# Patient Record
Sex: Female | Born: 1945
Health system: Southern US, Community
[De-identification: ages and names within clinical notes are randomized; demographics above are authoritative.]

## PROBLEM LIST (undated history)

## (undated) DIAGNOSIS — E119 Type 2 diabetes mellitus without complications: Secondary | ICD-10-CM

## (undated) DIAGNOSIS — E7800 Pure hypercholesterolemia, unspecified: Secondary | ICD-10-CM

## (undated) DIAGNOSIS — I10 Essential (primary) hypertension: Secondary | ICD-10-CM

## (undated) HISTORY — PX: POLYPECTOMY: SHX149

## (undated) HISTORY — PX: DILATION AND CURETTAGE OF UTERUS: SHX78

---

## 2006-06-24 ENCOUNTER — Ambulatory Visit: Payer: Self-pay | Admitting: Internal Medicine

## 2007-08-18 ENCOUNTER — Ambulatory Visit: Payer: Self-pay | Admitting: Internal Medicine

## 2010-04-17 ENCOUNTER — Ambulatory Visit: Payer: Self-pay | Admitting: Internal Medicine

## 2014-01-04 ENCOUNTER — Ambulatory Visit: Payer: Self-pay

## 2014-12-18 DIAGNOSIS — E119 Type 2 diabetes mellitus without complications: Secondary | ICD-10-CM | POA: Diagnosis not present

## 2014-12-18 DIAGNOSIS — I10 Essential (primary) hypertension: Secondary | ICD-10-CM | POA: Diagnosis not present

## 2014-12-18 DIAGNOSIS — E538 Deficiency of other specified B group vitamins: Secondary | ICD-10-CM | POA: Insufficient documentation

## 2014-12-18 DIAGNOSIS — E780 Pure hypercholesterolemia: Secondary | ICD-10-CM | POA: Diagnosis not present

## 2014-12-18 DIAGNOSIS — D509 Iron deficiency anemia, unspecified: Secondary | ICD-10-CM | POA: Diagnosis not present

## 2015-02-07 DIAGNOSIS — I10 Essential (primary) hypertension: Secondary | ICD-10-CM | POA: Diagnosis not present

## 2015-02-07 DIAGNOSIS — E780 Pure hypercholesterolemia: Secondary | ICD-10-CM | POA: Diagnosis not present

## 2015-02-07 DIAGNOSIS — E538 Deficiency of other specified B group vitamins: Secondary | ICD-10-CM | POA: Diagnosis not present

## 2015-02-07 DIAGNOSIS — Z79899 Other long term (current) drug therapy: Secondary | ICD-10-CM | POA: Diagnosis not present

## 2015-02-07 DIAGNOSIS — E119 Type 2 diabetes mellitus without complications: Secondary | ICD-10-CM | POA: Diagnosis not present

## 2015-02-18 DIAGNOSIS — E119 Type 2 diabetes mellitus without complications: Secondary | ICD-10-CM | POA: Diagnosis not present

## 2015-02-18 DIAGNOSIS — I10 Essential (primary) hypertension: Secondary | ICD-10-CM | POA: Diagnosis not present

## 2015-02-18 DIAGNOSIS — E538 Deficiency of other specified B group vitamins: Secondary | ICD-10-CM | POA: Diagnosis not present

## 2015-02-18 DIAGNOSIS — E780 Pure hypercholesterolemia: Secondary | ICD-10-CM | POA: Diagnosis not present

## 2015-05-17 DIAGNOSIS — E119 Type 2 diabetes mellitus without complications: Secondary | ICD-10-CM | POA: Diagnosis not present

## 2015-05-20 ENCOUNTER — Other Ambulatory Visit: Payer: Self-pay | Admitting: Internal Medicine

## 2015-05-20 DIAGNOSIS — Z1231 Encounter for screening mammogram for malignant neoplasm of breast: Secondary | ICD-10-CM

## 2015-05-21 ENCOUNTER — Other Ambulatory Visit: Payer: Self-pay | Admitting: Internal Medicine

## 2015-05-21 ENCOUNTER — Ambulatory Visit
Admission: RE | Admit: 2015-05-21 | Discharge: 2015-05-21 | Disposition: A | Discharge status: Home / Self Care | Payer: Commercial Managed Care - HMO | Source: Ambulatory Visit | Attending: Internal Medicine | Admitting: Internal Medicine

## 2015-05-21 DIAGNOSIS — Z1231 Encounter for screening mammogram for malignant neoplasm of breast: Secondary | ICD-10-CM | POA: Diagnosis not present

## 2015-05-24 DIAGNOSIS — E780 Pure hypercholesterolemia: Secondary | ICD-10-CM | POA: Diagnosis not present

## 2015-05-24 DIAGNOSIS — E119 Type 2 diabetes mellitus without complications: Secondary | ICD-10-CM | POA: Diagnosis not present

## 2015-05-24 DIAGNOSIS — D509 Iron deficiency anemia, unspecified: Secondary | ICD-10-CM | POA: Diagnosis not present

## 2015-05-24 DIAGNOSIS — I10 Essential (primary) hypertension: Secondary | ICD-10-CM | POA: Diagnosis not present

## 2015-07-16 DIAGNOSIS — I10 Essential (primary) hypertension: Secondary | ICD-10-CM | POA: Diagnosis not present

## 2015-07-16 DIAGNOSIS — R5381 Other malaise: Secondary | ICD-10-CM | POA: Diagnosis not present

## 2015-07-16 DIAGNOSIS — R5383 Other fatigue: Secondary | ICD-10-CM | POA: Diagnosis not present

## 2015-08-16 DIAGNOSIS — Z79899 Other long term (current) drug therapy: Secondary | ICD-10-CM | POA: Diagnosis not present

## 2015-08-16 DIAGNOSIS — I10 Essential (primary) hypertension: Secondary | ICD-10-CM | POA: Diagnosis not present

## 2015-08-16 DIAGNOSIS — E119 Type 2 diabetes mellitus without complications: Secondary | ICD-10-CM | POA: Diagnosis not present

## 2015-08-16 DIAGNOSIS — E7800 Pure hypercholesterolemia, unspecified: Secondary | ICD-10-CM | POA: Diagnosis not present

## 2015-08-23 DIAGNOSIS — Z79899 Other long term (current) drug therapy: Secondary | ICD-10-CM | POA: Diagnosis not present

## 2015-08-23 DIAGNOSIS — E7800 Pure hypercholesterolemia, unspecified: Secondary | ICD-10-CM | POA: Diagnosis not present

## 2015-08-23 DIAGNOSIS — I10 Essential (primary) hypertension: Secondary | ICD-10-CM | POA: Diagnosis not present

## 2015-08-23 DIAGNOSIS — E119 Type 2 diabetes mellitus without complications: Secondary | ICD-10-CM | POA: Diagnosis not present

## 2015-11-06 DIAGNOSIS — J208 Acute bronchitis due to other specified organisms: Secondary | ICD-10-CM | POA: Diagnosis not present

## 2015-11-06 DIAGNOSIS — B9689 Other specified bacterial agents as the cause of diseases classified elsewhere: Secondary | ICD-10-CM | POA: Diagnosis not present

## 2015-11-06 DIAGNOSIS — J019 Acute sinusitis, unspecified: Secondary | ICD-10-CM | POA: Diagnosis not present

## 2015-11-19 DIAGNOSIS — E119 Type 2 diabetes mellitus without complications: Secondary | ICD-10-CM | POA: Diagnosis not present

## 2015-11-19 DIAGNOSIS — Z79899 Other long term (current) drug therapy: Secondary | ICD-10-CM | POA: Diagnosis not present

## 2015-11-26 DIAGNOSIS — E118 Type 2 diabetes mellitus with unspecified complications: Secondary | ICD-10-CM | POA: Diagnosis not present

## 2015-11-26 DIAGNOSIS — E1165 Type 2 diabetes mellitus with hyperglycemia: Secondary | ICD-10-CM | POA: Diagnosis not present

## 2015-11-26 DIAGNOSIS — E7800 Pure hypercholesterolemia, unspecified: Secondary | ICD-10-CM | POA: Diagnosis not present

## 2015-11-26 DIAGNOSIS — N8110 Cystocele, unspecified: Secondary | ICD-10-CM | POA: Diagnosis not present

## 2015-11-26 DIAGNOSIS — I119 Hypertensive heart disease without heart failure: Secondary | ICD-10-CM | POA: Diagnosis not present

## 2015-11-26 DIAGNOSIS — I10 Essential (primary) hypertension: Secondary | ICD-10-CM | POA: Diagnosis not present

## 2015-12-23 DIAGNOSIS — H5210 Myopia, unspecified eye: Secondary | ICD-10-CM | POA: Diagnosis not present

## 2015-12-23 DIAGNOSIS — E113393 Type 2 diabetes mellitus with moderate nonproliferative diabetic retinopathy without macular edema, bilateral: Secondary | ICD-10-CM | POA: Diagnosis not present

## 2015-12-23 DIAGNOSIS — H524 Presbyopia: Secondary | ICD-10-CM | POA: Diagnosis not present

## 2016-01-01 DIAGNOSIS — N814 Uterovaginal prolapse, unspecified: Secondary | ICD-10-CM | POA: Diagnosis not present

## 2016-01-01 DIAGNOSIS — N8110 Cystocele, unspecified: Secondary | ICD-10-CM | POA: Diagnosis not present

## 2016-01-02 DIAGNOSIS — E7800 Pure hypercholesterolemia, unspecified: Secondary | ICD-10-CM | POA: Diagnosis not present

## 2016-01-02 DIAGNOSIS — I10 Essential (primary) hypertension: Secondary | ICD-10-CM | POA: Diagnosis not present

## 2016-01-02 DIAGNOSIS — I119 Hypertensive heart disease without heart failure: Secondary | ICD-10-CM | POA: Diagnosis not present

## 2016-01-02 DIAGNOSIS — Z79899 Other long term (current) drug therapy: Secondary | ICD-10-CM | POA: Diagnosis not present

## 2016-01-02 DIAGNOSIS — E538 Deficiency of other specified B group vitamins: Secondary | ICD-10-CM | POA: Diagnosis not present

## 2016-01-02 DIAGNOSIS — R0789 Other chest pain: Secondary | ICD-10-CM | POA: Diagnosis not present

## 2016-01-02 DIAGNOSIS — N812 Incomplete uterovaginal prolapse: Secondary | ICD-10-CM | POA: Diagnosis not present

## 2016-01-02 DIAGNOSIS — E118 Type 2 diabetes mellitus with unspecified complications: Secondary | ICD-10-CM | POA: Diagnosis not present

## 2016-01-02 DIAGNOSIS — E1165 Type 2 diabetes mellitus with hyperglycemia: Secondary | ICD-10-CM | POA: Diagnosis not present

## 2016-01-02 DIAGNOSIS — Z4689 Encounter for fitting and adjustment of other specified devices: Secondary | ICD-10-CM | POA: Diagnosis not present

## 2016-01-14 DIAGNOSIS — E7800 Pure hypercholesterolemia, unspecified: Secondary | ICD-10-CM | POA: Diagnosis not present

## 2016-01-14 DIAGNOSIS — Z7189 Other specified counseling: Secondary | ICD-10-CM | POA: Diagnosis not present

## 2016-01-14 DIAGNOSIS — I10 Essential (primary) hypertension: Secondary | ICD-10-CM | POA: Diagnosis not present

## 2016-01-14 DIAGNOSIS — I208 Other forms of angina pectoris: Secondary | ICD-10-CM | POA: Diagnosis not present

## 2016-01-29 DIAGNOSIS — I208 Other forms of angina pectoris: Secondary | ICD-10-CM | POA: Diagnosis not present

## 2016-01-29 DIAGNOSIS — I10 Essential (primary) hypertension: Secondary | ICD-10-CM | POA: Diagnosis not present

## 2016-01-29 DIAGNOSIS — E7800 Pure hypercholesterolemia, unspecified: Secondary | ICD-10-CM | POA: Diagnosis not present

## 2016-02-06 DIAGNOSIS — E1165 Type 2 diabetes mellitus with hyperglycemia: Secondary | ICD-10-CM | POA: Diagnosis not present

## 2016-02-06 DIAGNOSIS — I10 Essential (primary) hypertension: Secondary | ICD-10-CM | POA: Diagnosis not present

## 2016-02-06 DIAGNOSIS — E538 Deficiency of other specified B group vitamins: Secondary | ICD-10-CM | POA: Diagnosis not present

## 2016-02-06 DIAGNOSIS — E7800 Pure hypercholesterolemia, unspecified: Secondary | ICD-10-CM | POA: Diagnosis not present

## 2016-02-06 DIAGNOSIS — E118 Type 2 diabetes mellitus with unspecified complications: Secondary | ICD-10-CM | POA: Diagnosis not present

## 2016-02-06 DIAGNOSIS — Z79899 Other long term (current) drug therapy: Secondary | ICD-10-CM | POA: Diagnosis not present

## 2016-02-14 DIAGNOSIS — I119 Hypertensive heart disease without heart failure: Secondary | ICD-10-CM | POA: Diagnosis not present

## 2016-02-14 DIAGNOSIS — I10 Essential (primary) hypertension: Secondary | ICD-10-CM | POA: Diagnosis not present

## 2016-02-14 DIAGNOSIS — E119 Type 2 diabetes mellitus without complications: Secondary | ICD-10-CM | POA: Diagnosis not present

## 2016-02-14 DIAGNOSIS — E7800 Pure hypercholesterolemia, unspecified: Secondary | ICD-10-CM | POA: Diagnosis not present

## 2016-03-09 ENCOUNTER — Observation Stay
Admission: EM | Admit: 2016-03-09 | Discharge: 2016-03-09 | Disposition: A | Discharge status: Home / Self Care | Payer: Commercial Managed Care - HMO | Attending: Internal Medicine | Admitting: Internal Medicine

## 2016-03-09 ENCOUNTER — Encounter: Payer: Self-pay | Admitting: Emergency Medicine

## 2016-03-09 ENCOUNTER — Other Ambulatory Visit: Payer: Self-pay

## 2016-03-09 ENCOUNTER — Observation Stay: Payer: Commercial Managed Care - HMO

## 2016-03-09 ENCOUNTER — Emergency Department: Payer: Commercial Managed Care - HMO

## 2016-03-09 DIAGNOSIS — M25512 Pain in left shoulder: Secondary | ICD-10-CM | POA: Insufficient documentation

## 2016-03-09 DIAGNOSIS — R079 Chest pain, unspecified: Secondary | ICD-10-CM | POA: Diagnosis not present

## 2016-03-09 DIAGNOSIS — E785 Hyperlipidemia, unspecified: Secondary | ICD-10-CM | POA: Diagnosis not present

## 2016-03-09 DIAGNOSIS — I10 Essential (primary) hypertension: Secondary | ICD-10-CM | POA: Diagnosis present

## 2016-03-09 DIAGNOSIS — Z79899 Other long term (current) drug therapy: Secondary | ICD-10-CM | POA: Diagnosis not present

## 2016-03-09 DIAGNOSIS — E7800 Pure hypercholesterolemia, unspecified: Secondary | ICD-10-CM | POA: Insufficient documentation

## 2016-03-09 DIAGNOSIS — Z794 Long term (current) use of insulin: Secondary | ICD-10-CM | POA: Diagnosis not present

## 2016-03-09 DIAGNOSIS — I2510 Atherosclerotic heart disease of native coronary artery without angina pectoris: Secondary | ICD-10-CM | POA: Insufficient documentation

## 2016-03-09 DIAGNOSIS — E119 Type 2 diabetes mellitus without complications: Secondary | ICD-10-CM | POA: Insufficient documentation

## 2016-03-09 DIAGNOSIS — M47814 Spondylosis without myelopathy or radiculopathy, thoracic region: Secondary | ICD-10-CM | POA: Diagnosis not present

## 2016-03-09 DIAGNOSIS — K760 Fatty (change of) liver, not elsewhere classified: Secondary | ICD-10-CM | POA: Insufficient documentation

## 2016-03-09 DIAGNOSIS — Z888 Allergy status to other drugs, medicaments and biological substances status: Secondary | ICD-10-CM | POA: Insufficient documentation

## 2016-03-09 DIAGNOSIS — Z7982 Long term (current) use of aspirin: Secondary | ICD-10-CM | POA: Insufficient documentation

## 2016-03-09 DIAGNOSIS — I200 Unstable angina: Secondary | ICD-10-CM | POA: Diagnosis not present

## 2016-03-09 HISTORY — DX: Pure hypercholesterolemia, unspecified: E78.00

## 2016-03-09 HISTORY — DX: Essential (primary) hypertension: I10

## 2016-03-09 HISTORY — DX: Type 2 diabetes mellitus without complications: E11.9

## 2016-03-09 LAB — BASIC METABOLIC PANEL
Anion gap: 7 (ref 5–15)
BUN: 13 mg/dL (ref 6–20)
CHLORIDE: 104 mmol/L (ref 101–111)
CO2: 27 mmol/L (ref 22–32)
CREATININE: 0.97 mg/dL (ref 0.44–1.00)
Calcium: 9.7 mg/dL (ref 8.9–10.3)
GFR calc Af Amer: >60 mL/min (ref >60)
GFR calc non Af Amer: 58 mL/min — ABNORMAL LOW (ref >60)
GLUCOSE: 263 mg/dL — AB (ref 65–99)
Potassium: 4 mmol/L (ref 3.5–5.1)
SODIUM: 138 mmol/L (ref 135–145)

## 2016-03-09 LAB — CBC
HCT: 40.8 % (ref 35.0–47.0)
Hemoglobin: 13.7 g/dL (ref 12.0–16.0)
MCH: 29.6 pg (ref 26.0–34.0)
MCHC: 33.4 g/dL (ref 32.0–36.0)
MCV: 88.5 fL (ref 80.0–100.0)
PLATELETS: 302 10*3/uL (ref 150–440)
RBC: 4.61 MIL/uL (ref 3.80–5.20)
RDW: 13.6 % (ref 11.5–14.5)
WBC: 7.1 10*3/uL (ref 3.6–11.0)

## 2016-03-09 LAB — LIPID PANEL
CHOL/HDL RATIO: 3.9 ratio
Cholesterol: 184 mg/dL (ref 0–200)
HDL: 47 mg/dL (ref >40)
LDL Cholesterol: 85 mg/dL (ref 0–99)
Triglycerides: 260 mg/dL — ABNORMAL HIGH (ref <150)
VLDL: 52 mg/dL — AB (ref 0–40)

## 2016-03-09 LAB — GLUCOSE, CAPILLARY
GLUCOSE-CAPILLARY: 245 mg/dL — AB (ref 65–99)
GLUCOSE-CAPILLARY: 137 mg/dL — AB (ref 65–99)
Glucose-Capillary: 156 mg/dL — ABNORMAL HIGH (ref 65–99)

## 2016-03-09 LAB — TROPONIN I
TROPONIN I: 0.04 ng/mL — AB (ref <0.031)
Troponin I: 0.04 ng/mL — ABNORMAL HIGH (ref <0.031)
Troponin I: 0.04 ng/mL — ABNORMAL HIGH (ref <0.031)

## 2016-03-09 MED ORDER — IOPAMIDOL (ISOVUE-300) INJECTION 61%
75.0000 mL | Freq: Once | INTRAVENOUS | Status: AC | PRN
  Administered 2016-03-09: 75 mL via INTRAVENOUS

## 2016-03-09 MED ORDER — SODIUM CHLORIDE 0.9% FLUSH
3.0000 mL | Freq: Two times a day (BID) | INTRAVENOUS | Status: DC
  Administered 2016-03-09: 3 mL via INTRAVENOUS

## 2016-03-09 MED ORDER — SPIRONOLACTONE 25 MG PO TABS
25.0000 mg | ORAL_TABLET | Freq: Every day | ORAL | Status: DC
  Administered 2016-03-09: 25 mg via ORAL
  Filled 2016-03-09: qty 1

## 2016-03-09 MED ORDER — ACETAMINOPHEN 325 MG PO TABS
650.0000 mg | ORAL_TABLET | ORAL | Status: DC | PRN
Start: 2016-03-09 — End: 2016-03-09

## 2016-03-09 MED ORDER — INSULIN DETEMIR 100 UNIT/ML ~~LOC~~ SOLN
20.0000 [IU] | Freq: Every day | SUBCUTANEOUS | Status: DC
  Filled 2016-03-09: qty 0.2

## 2016-03-09 MED ORDER — ASPIRIN 81 MG PO CHEW
324.0000 mg | CHEWABLE_TABLET | ORAL | Status: AC
  Administered 2016-03-09: 324 mg via ORAL
  Filled 2016-03-09: qty 4

## 2016-03-09 MED ORDER — ONDANSETRON HCL 4 MG/2ML IJ SOLN
4.0000 mg | Freq: Four times a day (QID) | INTRAMUSCULAR | Status: DC | PRN
  Administered 2016-03-09: 4 mg via INTRAVENOUS
  Filled 2016-03-09: qty 2

## 2016-03-09 MED ORDER — HYDRALAZINE HCL 20 MG/ML IJ SOLN
10.0000 mg | INTRAMUSCULAR | Status: DC | PRN
  Administered 2016-03-09: 10 mg via INTRAVENOUS
  Filled 2016-03-09: qty 1

## 2016-03-09 MED ORDER — ASPIRIN EC 81 MG PO TBEC: 81.0000 mg | DELAYED_RELEASE_TABLET | Freq: Every day | ORAL | Status: DC

## 2016-03-09 MED ORDER — METOPROLOL SUCCINATE ER 50 MG PO TB24
50.0000 mg | ORAL_TABLET | Freq: Every day | ORAL | Status: DC
  Administered 2016-03-09: 50 mg via ORAL
  Filled 2016-03-09: qty 1

## 2016-03-09 MED ORDER — ASPIRIN 300 MG RE SUPP: 300.0000 mg | RECTAL | Status: AC

## 2016-03-09 MED ORDER — ENOXAPARIN SODIUM 40 MG/0.4ML ~~LOC~~ SOLN
40.0000 mg | Freq: Every day | SUBCUTANEOUS | Status: DC
  Administered 2016-03-09: 40 mg via SUBCUTANEOUS
  Filled 2016-03-09 (×2): qty 0.4

## 2016-03-09 MED ORDER — NITROGLYCERIN 0.4 MG SL SUBL
0.4000 mg | SUBLINGUAL_TABLET | SUBLINGUAL | Status: DC | PRN
Start: 2016-03-09 — End: 2016-03-09
  Administered 2016-03-09 (×2): 0.4 mg via SUBLINGUAL
  Filled 2016-03-09: qty 1

## 2016-03-09 MED ORDER — HYDROCODONE-ACETAMINOPHEN 5-325 MG PO TABS
1.0000 | ORAL_TABLET | ORAL | Status: DC | PRN
  Administered 2016-03-09: 1 via ORAL
  Filled 2016-03-09: qty 1

## 2016-03-09 MED ORDER — SODIUM CHLORIDE 0.9 % IV SOLN: 250.0000 mL | INTRAVENOUS | Status: DC | PRN

## 2016-03-09 MED ORDER — SIMVASTATIN 40 MG PO TABS: 40.0000 mg | ORAL_TABLET | Freq: Every day | ORAL | Status: DC

## 2016-03-09 MED ORDER — MORPHINE SULFATE (PF) 2 MG/ML IV SOLN
2.0000 mg | INTRAVENOUS | Status: DC | PRN
  Administered 2016-03-09 (×2): 2 mg via INTRAVENOUS
  Filled 2016-03-09 (×2): qty 1

## 2016-03-09 MED ORDER — SODIUM CHLORIDE 0.9% FLUSH
3.0000 mL | INTRAVENOUS | Status: DC | PRN
  Administered 2016-03-09 (×2): 3 mL via INTRAVENOUS
  Filled 2016-03-09 (×2): qty 3

## 2016-03-09 MED ORDER — INSULIN ASPART 100 UNIT/ML ~~LOC~~ SOLN: 0.0000 [IU] | Freq: Every day | SUBCUTANEOUS | Status: DC

## 2016-03-09 MED ORDER — IRBESARTAN 150 MG PO TABS
150.0000 mg | ORAL_TABLET | Freq: Every day | ORAL | Status: DC
  Administered 2016-03-09: 150 mg via ORAL
  Filled 2016-03-09: qty 1

## 2016-03-09 MED ORDER — INSULIN ASPART 100 UNIT/ML ~~LOC~~ SOLN
0.0000 [IU] | Freq: Three times a day (TID) | SUBCUTANEOUS | Status: DC
  Administered 2016-03-09: 2 [IU] via SUBCUTANEOUS
  Administered 2016-03-09: 3 [IU] via SUBCUTANEOUS
  Administered 2016-03-09: 5 [IU] via SUBCUTANEOUS
  Filled 2016-03-09: qty 2
  Filled 2016-03-09: qty 3
  Filled 2016-03-09: qty 5

## 2016-03-09 NOTE — ED Notes (Signed)
MD at bedside. 

## 2016-03-09 NOTE — Progress Notes (Signed)
Patient ID: Sabrina [name], female   DOB: 11-19-1945, 70 y.o.   MRN: [id number] [facility] [place] NAME: Sabrina [name]    MR#:  [id number]  DATE OF BIRTH:  1946-08-06  SUBJECTIVE:  Patient comes in with intermittent on and off chest pain she describes is deep down on the left upper chest radiating across. Denies any diaphoresis or shortness of breath. Pain has been going on and off since January.  REVIEW OF SYSTEMS:   Review of Systems  Constitutional: Negative for fever, chills and weight loss.  HENT: Negative for ear discharge, ear pain and nosebleeds.   Eyes: Negative for blurred vision, pain and discharge.  Respiratory: Negative for sputum production, shortness of breath, wheezing and stridor.   Cardiovascular: Negative for chest pain, palpitations, orthopnea and PND.  Gastrointestinal: Negative for nausea, vomiting, abdominal pain and diarrhea.  Genitourinary: Negative for urgency and frequency.  Musculoskeletal: Negative for back pain and joint pain.  Neurological: Negative for sensory change, speech change, focal weakness and weakness.  Psychiatric/Behavioral: Negative for depression and hallucinations. The patient is not nervous/anxious.    Tolerating Diet:yes Tolerating PT: not needed  DRUG ALLERGIES:   Allergies  Allergen Reactions  . Glimepiride Diarrhea and Other (See Comments)  . Glipizide Other (See Comments)  . Pioglitazone Swelling    On legs    VITALS:  Blood pressure 167/69, pulse 71, temperature 97.5 F (36.4 C), temperature source Oral, resp. rate 17, height 5\' 1"  (1.549 m), weight 72.303 kg (159 lb 6.4 oz), SpO2 99 %.  PHYSICAL EXAMINATION:   Physical Exam  GENERAL:  70 y.o.-year-old patient lying in the bed with no acute distress.  EYES: Pupils equal, round, reactive to light and accommodation. No scleral icterus. Extraocular muscles intact.  HEENT: Head atraumatic, normocephalic. Oropharynx and  nasopharynx clear.  NECK:  Supple, no jugular venous distention. No thyroid enlargement, no tenderness.  LUNGS: Normal breath sounds bilaterally, no wheezing, rales, rhonchi. No use of accessory muscles of respiration.  CARDIOVASCULAR: S1, S2 normal. No murmurs, rubs, or gallops.  ABDOMEN: Soft, nontender, nondistended. Bowel sounds present. No organomegaly or mass.  EXTREMITIES: No cyanosis, clubbing or edema b/l.    NEUROLOGIC: Cranial nerves II through XII are intact. No focal Motor or sensory deficits b/l.   PSYCHIATRIC:  patient is alert and oriented x 3.  SKIN: No obvious rash, lesion, or ulcer.   LABORATORY PANEL:  CBC  Recent Labs Lab 03/09/16 0052  WBC 7.1  HGB 13.7  HCT 40.8  PLT 302    Chemistries   Recent Labs Lab 03/09/16 0052  NA 138  K 4.0  CL 104  CO2 27  GLUCOSE 263*  BUN 13  CREATININE 0.97  CALCIUM 9.7   Cardiac Enzymes  Recent Labs Lab 03/09/16 1241  TROPONINI 0.04*   RADIOLOGY:  Dg Chest 2 View  03/09/2016  CLINICAL DATA:  70 year old female with chest pain EXAM: CHEST  2 VIEW COMPARISON:  None. FINDINGS: The heart size and mediastinal contours are within normal limits. Both lungs are clear. The visualized skeletal structures are unremarkable. IMPRESSION: No active cardiopulmonary disease. Electronically Signed   By: Anner Crete M.D.   On: 03/09/2016 01:36   ASSESSMENT AND PLAN:  Jerzie Sidman is a 70 y.o. female with a known history of Hypertension, diabetes mellitus, hyperlipidemia presented to the emergency room with chest pain. Chest pain is going on for more than 3 months duration. Pain is located in  the left side of the chest and radiates to her left shoulder. Whenever the pain comes it is sharp in nature 6 out of 10 on a scale of 1-10. Patient had a cardiac stress test done as an outpatient and she says her stress test was normal  1. Left upper chest pain on and off since January 2017 appears atypical. Unclear etiology. -Patient had  undergone cardiac stress test and April 2017 was within normal limits -Seen by Dr. Nehemiah Massed today recommends ambulation and no further cardiac workup. -Called by RN patient continues to have chest pain will do CT of the chest with contrast -Cardiac markers of borderline elevated appears demand ischemia  2. Hypertension continue home meds  3. Diabetes on home meds and sliding scale  4. Hyperlipidemia on statins  Discussed with patient's daughter and patient. If CT chest remains negative we will consider discharging patient home Case discussed with Care Management/Social Worker. Management plans discussed with the patient, family and they are in agreement.  CODE STATUS: full  DVT Prophylaxis: lovenox  TOTAL TIME TAKING CARE OF THIS PATIENT: 30 minutes.  >50% time spent on counselling and coordination of care  Note: This dictation was prepared with Dragon dictation along with smaller phrase technology. Any transcriptional errors that result from this process are unintentional.  Suda Forbess M.D on 03/09/2016 at 2:38 PM  Between 7am to 6pm - Pager - [phone]  After 6pm go to [email or web address] - password [email or web address] [facility]  Office  [phone]  CC: Primary care physician; [name], MD

## 2016-03-09 NOTE — Discharge Instructions (Signed)
Take your metformin after 48 hours

## 2016-03-09 NOTE — Plan of Care (Signed)
Problem: Pain Managment: Goal: General experience of comfort will improve Outcome: Not Progressing Patient continue to have chest pain after 2 sublingual nitroglycerins. It is at 3/10, no complete relief.  Patient states pain is tolerable. Patient became nauseous after taking nitroglycerin. Chest pain escalated within the first 5 minutes after recieving 2nd nitro, then de-escalated. Patient is currently resting in bed.

## 2016-03-09 NOTE — Progress Notes (Signed)
Home meds from pharmacy given to pt.  Pt/daughter concerned that pt is still having pain and no clear etiology.  Recommended follow up with Primary care md.

## 2016-03-09 NOTE — Progress Notes (Signed)
Pt discharged to home via wc.  Instructions  given to pt.  Questions answered.  No distress.  

## 2016-03-09 NOTE — Progress Notes (Signed)
Back from CT

## 2016-03-09 NOTE — Consult Note (Signed)
[facility] Consultation Note  Patient ID: Sabrina [name], MRN: [id number], DOB/AGE: January 11, 1946 70 y.o. Admit date: 03/09/2016   Date of Consult: 03/09/2016 Primary Physician: [name], MD Primary Cardiologist: Nehemiah Massed  Chief Complaint:  Chief Complaint  Patient presents with  . Chest Pain   Reason for Consult: chest pain  HPI: 70 y.o. female with known essential hypertension mixed hyperlipidemia and diabetes who is had intermittent episodes of atypical chest discomfort in the left upper chest deep in the issues with some activity and movement but not necessarily significantly with exertion. The patient does have pain when moving her left arm. The pain has been severe in nature and deep over the last several days to the point where she was seen in the emergency room. At that time the patient had an EKG showing normal sinus rhythm and troponin level was 0.04 unchanged. The patient has been on appropriate antihypertensive medication management and antianginal medications with no improvements. This includes metoprolol and Avapro. Patient also does have other risk factors including diabetes which increases her level of risk of but currently with continued atypical signs and no evidence of myocardial infarction will not need further intervention. She did have a recent stress test and echo cardiogram showing normal LV systolic function and normal myocardial perfusion. She also is on high intensity cholesterol therapy stable at this time  Past Medical History  Diagnosis Date  . Hypertension   . Diabetes mellitus without complication ([place])   . High cholesterol       Surgical History:  Past Surgical History  Procedure Laterality Date  . Polypectomy       Home Meds: Prior to Admission medications   Medication Sig Start Date End Date Taking? Authorizing Provider  aspirin EC 81 MG tablet Take 1 tablet by mouth daily.   Yes Historical Provider, MD  irbesartan (AVAPRO) 150  MG tablet Take 1 tablet by mouth daily. 02/18/16  Yes Historical Provider, MD  LEVEMIR FLEXTOUCH 100 UNIT/ML Pen Inject 20 Units into the skin at bedtime. 02/17/16  Yes Historical Provider, MD  metFORMIN (GLUCOPHAGE) 500 MG tablet Take 1,000 mg by mouth 2 (two) times daily. 01/04/16  Yes Historical Provider, MD  metoprolol succinate (TOPROL-XL) 50 MG 24 hr tablet Take 50 mg by mouth daily. 02/17/16  Yes Historical Provider, MD  simvastatin (ZOCOR) 40 MG tablet Take 1 tablet by mouth at bedtime. 01/29/16  Yes Historical Provider, MD  spironolactone (ALDACTONE) 25 MG tablet Take 25 mg by mouth daily. 02/18/16  Yes Historical Provider, MD    Inpatient Medications:  . [START ON 03/10/2016] aspirin EC  81 mg Oral Daily  . enoxaparin (LOVENOX) injection  40 mg Subcutaneous Daily  . insulin aspart  0-15 Units Subcutaneous TID WC  . insulin aspart  0-5 Units Subcutaneous QHS  . insulin detemir  20 Units Subcutaneous QHS  . irbesartan  150 mg Oral Daily  . metoprolol succinate  50 mg Oral Daily  . simvastatin  40 mg Oral QHS  . sodium chloride flush  3 mL Intravenous Q12H  . spironolactone  25 mg Oral Daily      Allergies:  Allergies  Allergen Reactions  . Glimepiride Diarrhea and Other (See Comments)  . Glipizide Other (See Comments)  . Pioglitazone Swelling    On legs    Social History   Social History  . Marital Status: Divorced    Spouse Name: N/A  . Number of Children: N/A  . Years of Education: N/A  Occupational History  . retired    Social History Main Topics  . Smoking status: Never Smoker   . Smokeless tobacco: Not on file  . Alcohol Use: No  . Drug Use: No  . Sexual Activity: Not on file   Other Topics Concern  . Not on file   Social History Narrative     Family History  Problem Relation Age of Onset  . Breast cancer Maternal Aunt     2 aunts in their 31's     Review of Systems Positive for Left upper chest discomfort Negative for: General:  chills, fever,  night sweats or weight changes.  Cardiovascular: PND orthopnea syncope dizziness  Dermatological skin lesions rashes Respiratory: Cough congestion Urologic: Frequent urination urination at night and hematuria Abdominal: negative for nausea, vomiting, diarrhea, bright red blood per rectum, melena, or hematemesis Neurologic: negative for visual changes, and/or hearing changes  All other systems reviewed and are otherwise negative except as noted above.  Labs:  Recent Labs  03/09/16 0052 03/09/16 0656  TROPONINI 0.04* 0.04*   Lab Results  Component Value Date   WBC 7.1 03/09/2016   HGB 13.7 03/09/2016   HCT 40.8 03/09/2016   MCV 88.5 03/09/2016   PLT 302 03/09/2016    Recent Labs Lab 03/09/16 0052  NA 138  K 4.0  CL 104  CO2 27  BUN 13  CREATININE 0.97  CALCIUM 9.7  GLUCOSE 263*   Lab Results  Component Value Date   CHOL 184 03/09/2016   HDL 47 03/09/2016   LDLCALC 85 03/09/2016   TRIG 260* 03/09/2016   No results found for: DDIMER  Radiology/Studies:  Dg Chest 2 View  03/09/2016  CLINICAL DATA:  70 year old female with chest pain EXAM: CHEST  2 VIEW COMPARISON:  None. FINDINGS: The heart size and mediastinal contours are within normal limits. Both lungs are clear. The visualized skeletal structures are unremarkable. IMPRESSION: No active cardiopulmonary disease. Electronically Signed   By: Anner Crete M.D.   On: 03/09/2016 01:36    EKG: Normal sinus rhythm. Normal EKG  Weights: Filed Weights   03/09/16 0043 03/09/16 0432  Weight: 160 lb (72.576 kg) 159 lb 6.4 oz (72.303 kg)     Physical Exam: Blood pressure 167/69, pulse 71, temperature 97.5 F (36.4 C), temperature source Oral, resp. rate 17, height 5\' 1"  (1.549 m), weight 159 lb 6.4 oz (72.303 kg), SpO2 99 %. Body mass index is 30.13 kg/(m^2). General: Well developed, well nourished, in no acute distress. Head eyes ears nose throat: Normocephalic, atraumatic, sclera non-icteric, no xanthomas, nares  are without discharge. No apparent thyromegaly and/or mass  Lungs: Normal respiratory effort.  no wheezes, no rales, no rhonchi.  Heart: RRR with normal S1 S2. no murmur gallop, no rub, PMI is normal size and placement, carotid upstroke normal without bruit, jugular venous pressure is normal Abdomen: Soft, non-tender, non-distended with normoactive bowel sounds. No hepatomegaly. No rebound/guarding. No obvious abdominal masses. Abdominal aorta is normal size without bruit Extremities: No edema. no cyanosis, no clubbing, no ulcers  Peripheral : 2+ bilateral upper extremity pulses, 2+ bilateral femoral pulses, 2+ bilateral dorsal pedal pulse Neuro: Alert and oriented. No facial asymmetry. No focal deficit. Moves all extremities spontaneously. Musculoskeletal: Normal muscle tone without kyphosis Psych:  Responds to questions appropriately with a normal affect.    Assessment: 70 year old female with the continued intermittent and atypical left upper chest discomfort lasting for the last several months with a normal stress test and normal echocardiogram  and no current evidence of myocardial infarction  Plan: 1. Continue present management with hypertension control with a goal systolic blood pressure below 130 mm 2. Continue high intensity cholesterol therapy with simvastatin X 3. Possible use of isosorbide with ambulation and follow for improvements of symptoms 4. Aspirin for further risk reduction cardiovascular event 5. No further cardiac diagnostics necessary at this time 6. Okay for discharge home from cardiac standpoint and would ambulate and follow for worsening symptoms requiring possibility of cardiac catheterization  Signed, Corey Skains M.D. [facility] 03/09/2016, 12:58 PM

## 2016-03-09 NOTE — H&P (Signed)
[facility] [place] NAME: Sabrina [name]    MR#:  [id number]  DATE OF BIRTH:  1946/02/25  DATE OF ADMISSION:  03/09/2016  PRIMARY CARE PHYSICIAN: SPARKS,JEFFREY D, MD   REQUESTING/REFERRING PHYSICIAN:   CHIEF COMPLAINT:   Chief Complaint  Patient presents with  . Chest Pain    HISTORY OF PRESENT ILLNESS: Sabrina [name]  is a 70 y.o. female with a known history of Hypertension, diabetes mellitus, hyperlipidemia presented to the emergency room with chest pain. Chest pain is going on for more than 3 months duration. Pain is located in the left side of the chest and radiates to her left shoulder. Whenever the pain comes it is sharp in nature 6 out of 10 on a scale of 1-10. Patient had a cardiac stress test done as an outpatient and she says her stress test was normal. No history of shortness of breath. No history of orthopnea or proximal nocturnal dyspnea. First set of troponin is borderline. EKG normal sinus rhythm no ST segment elevation or depression.  PAST MEDICAL HISTORY:   Past Medical History  Diagnosis Date  . Hypertension   . Diabetes mellitus without complication ([place])   . High cholesterol     PAST SURGICAL HISTORY: Past Surgical History  Procedure Laterality Date  . Polypectomy      SOCIAL HISTORY:  Social History  Substance Use Topics  . Smoking status: Never Smoker   . Smokeless tobacco: Not on file  . Alcohol Use: No    FAMILY HISTORY:  Family History  Problem Relation Age of Onset  . Breast cancer Maternal Aunt     2 aunts in their 56's    DRUG ALLERGIES:  Allergies  Allergen Reactions  . Glimepiride Diarrhea and Other (See Comments)  . Glipizide Other (See Comments)  . Pioglitazone Swelling    On legs    REVIEW OF SYSTEMS:   CONSTITUTIONAL: No fever, fatigue or weakness.  EYES: No blurred or double vision.  EARS, NOSE, AND THROAT: No tinnitus or ear pain.  RESPIRATORY: No cough, shortness of  breath, wheezing or hemoptysis.  CARDIOVASCULAR: Has chest pain,no orthopnea, edema.  GASTROINTESTINAL: No nausea, vomiting, diarrhea or abdominal pain.  GENITOURINARY: No dysuria, hematuria.  ENDOCRINE: No polyuria, nocturia,  HEMATOLOGY: No anemia, easy bruising or bleeding SKIN: No rash or lesion. MUSCULOSKELETAL: No joint pain or arthritis.   NEUROLOGIC: No tingling, numbness, weakness.  PSYCHIATRY: No anxiety or depression.   MEDICATIONS AT HOME:  Prior to Admission medications   Medication Sig Start Date End Date Taking? Authorizing Provider  aspirin EC 81 MG tablet Take 1 tablet by mouth daily.   Yes Historical Provider, MD  irbesartan (AVAPRO) 150 MG tablet Take 1 tablet by mouth daily. 02/18/16  Yes Historical Provider, MD  LEVEMIR FLEXTOUCH 100 UNIT/ML Pen Inject 20 Units into the skin at bedtime. 02/17/16  Yes Historical Provider, MD  metFORMIN (GLUCOPHAGE) 500 MG tablet Take 1,000 mg by mouth 2 (two) times daily. 01/04/16  Yes Historical Provider, MD  metoprolol succinate (TOPROL-XL) 50 MG 24 hr tablet Take 50 mg by mouth daily. 02/17/16  Yes Historical Provider, MD  simvastatin (ZOCOR) 40 MG tablet Take 1 tablet by mouth at bedtime. 01/29/16  Yes Historical Provider, MD  spironolactone (ALDACTONE) 25 MG tablet Take 25 mg by mouth daily. 02/18/16  Yes Historical Provider, MD      PHYSICAL EXAMINATION:   VITAL SIGNS: Blood pressure 199/91, pulse 78, temperature 97.8 F (  36.6 C), temperature source Oral, resp. rate 18, height 5\' 1"  (1.549 m), weight 72.576 kg (160 lb), SpO2 98 %.  GENERAL:  70 y.o.-year-old patient lying in the bed with no acute distress.  EYES: Pupils equal, round, reactive to light and accommodation. No scleral icterus. Extraocular muscles intact.  HEENT: Head atraumatic, normocephalic. Oropharynx and nasopharynx clear.  NECK:  Supple, no jugular venous distention. No thyroid enlargement, no tenderness.  LUNGS: Normal breath sounds bilaterally, no wheezing,  rales,rhonchi or crepitation. No use of accessory muscles of respiration.  CARDIOVASCULAR: S1, S2 normal. No murmurs, rubs, or gallops.  ABDOMEN: Soft, nontender, nondistended. Bowel sounds present. No organomegaly or mass.  EXTREMITIES: No pedal edema, cyanosis, or clubbing.  NEUROLOGIC: Cranial nerves II through XII are intact. Muscle strength 5/5 in all extremities. Sensation intact. Gait normal.  PSYCHIATRIC: The patient is alert and oriented x 3.  SKIN: No obvious rash, lesion, or ulcer.   LABORATORY PANEL:   CBC  Recent Labs Lab 03/09/16 0052  WBC 7.1  HGB 13.7  HCT 40.8  PLT 302  MCV 88.5  MCH 29.6  MCHC 33.4  RDW 13.6   ------------------------------------------------------------------------------------------------------------------  Chemistries   Recent Labs Lab 03/09/16 0052  NA 138  K 4.0  CL 104  CO2 27  GLUCOSE 263*  BUN 13  CREATININE 0.97  CALCIUM 9.7   ------------------------------------------------------------------------------------------------------------------ estimated creatinine clearance is 49.9 mL/min (by C-G formula based on Cr of 0.97). ------------------------------------------------------------------------------------------------------------------ No results for input(s): TSH, T4TOTAL, T3FREE, THYROIDAB in the last 72 hours.  Invalid input(s): FREET3   Coagulation profile No results for input(s): INR, PROTIME in the last 168 hours. ------------------------------------------------------------------------------------------------------------------- No results for input(s): DDIMER in the last 72 hours. -------------------------------------------------------------------------------------------------------------------  Cardiac Enzymes  Recent Labs Lab 03/09/16 0052  TROPONINI 0.04*   ------------------------------------------------------------------------------------------------------------------ Invalid input(s):  POCBNP  ---------------------------------------------------------------------------------------------------------------  Urinalysis No results found for: COLORURINE, APPEARANCEUR, LABSPEC, PHURINE, GLUCOSEU, HGBUR, BILIRUBINUR, KETONESUR, PROTEINUR, UROBILINOGEN, NITRITE, LEUKOCYTESUR   RADIOLOGY: Dg Chest 2 View  03/09/2016  CLINICAL DATA:  70 year old female with chest pain EXAM: CHEST  2 VIEW COMPARISON:  None. FINDINGS: The heart size and mediastinal contours are within normal limits. Both lungs are clear. The visualized skeletal structures are unremarkable. IMPRESSION: No active cardiopulmonary disease. Electronically Signed   By: Anner Crete M.D.   On: 03/09/2016 01:36    EKG: Orders placed or performed during the hospital encounter of 03/09/16  . ED EKG within 10 minutes  . ED EKG within 10 minutes    IMPRESSION AND PLAN: 69 year old female patient with history of hypertension hyperlipidemia and diabetes mellitus presented to the emergency room with chest pain.  Admitting diagnosis : 1. Chest pain rule out myocardial infarction 2. Hypertension uncontrolled 3. Hyperlipidemia 4. Type 2 diabetes mellitus Treatment plan Admit patient to telemetry observation bed Aspirin 81 mg orally daily When necessary nitrates and morphine for chest pain Cycle troponin to rule out ischemia Check echocardiogram and cardiology consultation Antihypertensive medication for control of blood pressure Supportive care.  All the records are reviewed and case discussed with ED provider. Management plans discussed with the patient, family and they are in agreement.  CODE STATUS:FULL Code Status History    This patient does not have a recorded code status. Please follow your organizational policy for patients in this situation.       TOTAL TIME TAKING CARE OF THIS PATIENT: 50 minutes.    Saundra Shelling M.D on 03/09/2016 at 2:40 AM  Between 7am to 6pm - Pager -  [phone]  After 6pm  go to [email or web address] - password [email or web address] [facility]  Office  [phone]  CC: Primary care physician; [name], MD

## 2016-03-09 NOTE — Progress Notes (Signed)
Dr. Posey Pronto updated on CT results.  Orders to dc pt to home

## 2016-03-09 NOTE — Plan of Care (Signed)
Problem: Pain Managment: Goal: General experience of comfort will improve Outcome: Not Progressing Prn medications   

## 2016-03-09 NOTE — ED Provider Notes (Signed)
[facility] Emergency Department Provider Note   ____________________________________________  Time seen: Approximately 2:10 AM  I have reviewed the triage vital signs and the nursing notes.   HISTORY  Chief Complaint Chest Pain   HPI Sabrina [name] is a 70 y.o. female who complains of left sided chest pain. She reports she's had this for about 2 months she had a negative stress test last month with Dr. Nehemiah Massed. Today she reports the pain is much much worse than usual and is making her nauseated which is something that has not done before. Pain is worse with exertion and better with rest is also worse if she moves her left arm. Not worse with palpation of the chest. Pain is severe became severe today is been going on severely most of the day.   Past Medical History  Diagnosis Date  . Hypertension   . Diabetes mellitus without complication ([place])   . High cholesterol     Patient Active Problem List   Diagnosis Date Noted  . Chest pain at rest 03/09/2016  . Uncontrolled hypertension 03/09/2016  . Chest pain 03/09/2016    Past Surgical History  Procedure Laterality Date  . Polypectomy      No current outpatient prescriptions on file.  Allergies Glimepiride; Glipizide; and Pioglitazone  Family History  Problem Relation Age of Onset  . Breast cancer Maternal Aunt     2 aunts in their 64's    Social History Social History  Substance Use Topics  . Smoking status: Never Smoker   . Smokeless tobacco: None  . Alcohol Use: No    Review of Systems Constitutional: No fever/chills Eyes: No visual changes. ENT: No sore throat. Cardiovascular:chest pain. Respiratory: Denies shortness of breath. Gastrointestinal: No abdominal pain.   nausea, no vomiting.  No diarrhea.  No constipation. Genitourinary: Negative for dysuria. Musculoskeletal: Negative for back pain. Skin: Negative for rash. Neurological: Negative for headaches, focal weakness  or numbness.  10-point ROS otherwise negative.  ____________________________________________   PHYSICAL EXAM:  VITAL SIGNS: ED Triage Vitals  Enc Vitals Group     BP 03/09/16 0043 207/83 mmHg     Pulse Rate 03/09/16 0043 85     Resp 03/09/16 0043 18     Temp 03/09/16 0043 97.8 F (36.6 C)     Temp Source 03/09/16 0043 Oral     SpO2 03/09/16 0043 97 %     Weight 03/09/16 0043 160 lb (72.576 kg)     Height 03/09/16 0043 5\' 1"  (1.549 m)     Head Cir --      Peak Flow --      Pain Score 03/09/16 0044 10     Pain Loc --      Pain Edu? --      Excl. in [place]? --     Constitutional: Alert and oriented. Well appearing and in no acute distress. Eyes: Conjunctivae are normal. PERRL. EOMI. Head: Atraumatic. Nose: No congestion/rhinnorhea. Mouth/Throat: Mucous membranes are moist.  Oropharynx non-erythematous. Neck: No stridor.   Cardiovascular: Normal rate, regular rhythm. Grossly normal heart sounds.  Good peripheral circulation. Respiratory: Normal respiratory effort.  No retractions. Lungs CTAB. Gastrointestinal: Soft and nontender. No distention. No abdominal bruits. No CVA tenderness. Musculoskeletal: No lower extremity tenderness nor edema.  No joint effusions. Neurologic:  Normal speech and language. No gross focal neurologic deficits are appreciated. No gait instability. Skin:  Skin is warm, dry and intact. No rash noted.   ____________________________________________  LABS (all labs ordered are listed, but only abnormal results are displayed)  Labs Reviewed  BASIC METABOLIC PANEL - Abnormal; Notable for the following:    Glucose, Bld 263 (*)    GFR calc non Af Amer 58 (*)    All other components within normal limits  TROPONIN I - Abnormal; Notable for the following:    Troponin I 0.04 (*)    All other components within normal limits  TROPONIN I - Abnormal; Notable for the following:    Troponin I 0.04 (*)    All other components within normal limits  LIPID PANEL  - Abnormal; Notable for the following:    Triglycerides 260 (*)    VLDL 52 (*)    All other components within normal limits  CBC  TROPONIN I   ____________________________________________  EKG  KG read and interpreted by me shows normal sinus rhythm at a rate of 83 left axis deviation. His reading LVH probable old inferior infarct with Q's in II, III, and F poor R-wave progression and no acute ST-T wave changes ____________________________________________  RADIOLOGY  Chest X-rays read by radiology as no acute disease ____________________________________________   PROCEDURES   ____________________________________________   INITIAL IMPRESSION / ASSESSMENT AND PLAN / ED COURSE  Pertinent labs & imaging results that were available during my care of the patient were reviewed by me and considered in my medical decision making (see chart for details).  Discussed with hospitalist has multiple risk factors chest pain is little bit different than usual and her troponin is mildly elevated we'll plan on admitting her. ____________________________________________   FINAL CLINICAL IMPRESSION(S) / ED DIAGNOSES  Final diagnoses:  Chest pain, unspecified chest pain type      NEW MEDICATIONS STARTED DURING THIS VISIT:  Current Discharge Medication List       Note:  This document was prepared using Dragon voice recognition software and may include unintentional dictation errors.    [name], MD 03/09/16 [phone]

## 2016-03-09 NOTE — Discharge Summary (Signed)
[name] [facility] [place] NAME: Sabrina [name]    MR#:  [id number]  DATE OF BIRTH:  May 18, 1946  DATE OF ADMISSION:  03/09/2016 ADMITTING PHYSICIAN: Saundra Shelling, MD  DATE OF DISCHARGE: 03/09/16  PRIMARY CARE PHYSICIAN: SPARKS,JEFFREY D, MD    ADMISSION DIAGNOSIS:  Chest pain, unspecified chest pain type [R07.9]  DISCHARGE DIAGNOSIS:  Chest pain left upper-unclear etio HTN DM-2  SECONDARY DIAGNOSIS:   Past Medical History  Diagnosis Date  . Hypertension   . Diabetes mellitus without complication ([place])   . High cholesterol     HOSPITAL COURSE:  [name] is a 70 y.o. female with a known history of Hypertension, diabetes mellitus, hyperlipidemia presented to the emergency room with chest pain. Chest pain is going on for more than 3 months duration. Pain is located in the left side of the chest and radiates to her left shoulder. Whenever the pain comes it is sharp in nature 6 out of 10 on a scale of 1-10. Patient had a cardiac stress test done as an outpatient and she says her stress test was normal  1. Left upper chest pain on and off since January 2017 appears atypical. Unclear etiology. -Patient had undergone cardiac stress test and April 2017 was within normal limits -Seen by Dr. Nehemiah Massed today recommends ambulation and no further cardiac workup. -Called by RN patient continues to have chest pain will do CT of the chest with contrast-shows LAD plaque -Cardiac markers of borderline elevated appears demand ischemia -pt advised to f/u PCP and cardiology  2. Hypertension continue home meds  3. Diabetes on home meds and sliding scale -resume metformin after 48 hours given IV contrast  4. Hyperlipidemia on statins  D/c  home CONSULTS OBTAINED:  Treatment Team:  Corey Skains, MD  DRUG ALLERGIES:   Allergies  Allergen Reactions  . Glimepiride Diarrhea and Other (See Comments)  . Glipizide Other (See Comments)  .  Pioglitazone Swelling    On legs    DISCHARGE MEDICATIONS:   Current Discharge Medication List    CONTINUE these medications which have NOT CHANGED   Details  aspirin EC 81 MG tablet Take 1 tablet by mouth daily.    irbesartan (AVAPRO) 150 MG tablet Take 1 tablet by mouth daily. Refills: 1    LEVEMIR FLEXTOUCH 100 UNIT/ML Pen Inject 20 Units into the skin at bedtime. Refills: 0    metFORMIN (GLUCOPHAGE) 500 MG tablet Take 1,000 mg by mouth 2 (two) times daily. Refills: 10    metoprolol succinate (TOPROL-XL) 50 MG 24 hr tablet Take 50 mg by mouth daily. Refills: 0    simvastatin (ZOCOR) 40 MG tablet Take 1 tablet by mouth at bedtime. Refills: 4    spironolactone (ALDACTONE) 25 MG tablet Take 25 mg by mouth daily. Refills: 0        If you experience worsening of your admission symptoms, develop shortness of breath, life threatening emergency, suicidal or homicidal thoughts you must seek medical attention immediately by calling 911 or calling your MD immediately  if symptoms less severe.  You Must read complete instructions/literature along with all the possible adverse reactions/side effects for all the Medicines you take and that have been prescribed to you. Take any new Medicines after you have completely understood and accept all the possible adverse reactions/side effects.   Please note  You were cared for by a hospitalist during your hospital stay. If you have any questions about your discharge medications  or the care you received while you were in the hospital after you are discharged, you can call the unit and asked to speak with the hospitalist on call if the hospitalist that took care of you is not available. Once you are discharged, your primary care physician will handle any further medical issues. Please note that NO REFILLS for any discharge medications will be authorized once you are discharged, as it is imperative that you return to your primary care physician (or  establish a relationship with a primary care physician if you do not have one) for your aftercare needs so that they can reassess your need for medications and monitor your lab values. Marland Kitchen   DATA REVIEW:   CBC   Recent Labs Lab 03/09/16 0052  WBC 7.1  HGB 13.7  HCT 40.8  PLT 302    Chemistries   Recent Labs Lab 03/09/16 0052  NA 138  K 4.0  CL 104  CO2 27  GLUCOSE 263*  BUN 13  CREATININE 0.97  CALCIUM 9.7    Microbiology Results   No results found for this or any previous visit (from the past 240 hour(s)).  RADIOLOGY:  Dg Chest 2 View  03/09/2016  CLINICAL DATA:  70 year old female with chest pain EXAM: CHEST  2 VIEW COMPARISON:  None. FINDINGS: The heart size and mediastinal contours are within normal limits. Both lungs are clear. The visualized skeletal structures are unremarkable. IMPRESSION: No active cardiopulmonary disease. Electronically Signed   By: Anner Crete M.D.   On: 03/09/2016 01:36   Ct Chest W Contrast  03/09/2016  CLINICAL DATA:  Left-sided chest pain radiating centrally. EXAM: CT CHEST WITH CONTRAST TECHNIQUE: Multidetector CT imaging of the chest was performed during intravenous contrast administration. CONTRAST:  78mL ISOVUE-300 IOPAMIDOL (ISOVUE-300) INJECTION 61% COMPARISON:  Chest x-ray earlier today. CT of the abdomen on 04/17/2010 FINDINGS: Mediastinum/Nodes: Normal heart size. No pericardial fluid identified. Calcified plaque present in the LAD and left circumflex coronary arteries. There are some calcifications also associated with the aortic valve. The thoracic aorta is normal in caliber and shows no evidence of aneurysm or dissection. Proximal great vessels are well visualized and normally patent. No mediastinal lymphadenopathy identified. The right lobe of the thyroid gland is slightly larger than the left and heterogeneous in appearance. Findings are consistent with goiter. No mass-effect on the airway. Lungs/Pleura: Mild scarring present at  the left lung base. There is no evidence of pulmonary edema, consolidation, pneumothorax, nodule or pleural fluid. Upper abdomen: Upper abdominal structures show hepatic steatosis. Rounded cystic structure measuring 2.4 cm and demonstrating well-circumscribed circumferential peripheral calcification is positioned just inferior to the left lobe of the liver and does not appear to be associated with bowel. This likely represents a benign calcified cyst or region of prior hemorrhage and appears stable since the prior study. Musculoskeletal: Bony structures show diffuse spondylosis of the thoracic spine. No fractures or bony lesions identified. IMPRESSION: 1. Evidence of coronary atherosclerosis with calcified plaque in the distribution of the LAD and left circumflex coronary arteries. 2. Mild enlargement of the right lobe of the thyroid gland consistent with goiter. 3. Stable well-circumscribed cystic structure in the right abdomen inferior to the liver and demonstrating peripheral calcification. This has the appearance of a benign calcified cyst or region of potentially prior hemorrhage that has become calcified. Electronically Signed   By: Aletta Edouard M.D.   On: 03/09/2016 17:08     Management plans discussed with the patient, family and they  are in agreement.  CODE STATUS:     Code Status Orders        Start     Ordered   03/09/16 0431  Full code   Continuous     03/09/16 0430    Code Status History    Date Active Date Inactive Code Status Order ID Comments User Context   This patient has a current code status but no historical code status.      TOTAL TIME TAKING CARE OF THIS PATIENT: *40 minutes.    Addilynn Mowrer M.D on 03/09/2016 at 6:06 PM  Between 7am to 6pm - Pager - [phone] After 6pm go to [email or web address] - password [email or web address]  Office  [phone]  CC: Primary care physician; [name], MD

## 2016-03-09 NOTE — ED Notes (Signed)
Pt reports left upper chest pain for 3-4 months, daily; pt seen by Dr Nehemiah Massed for same, last saw him in April; had a stress test the beginning of April and was told heart was fine; pt says tonight the pain got so bad she "couldn't hardly lift my arm"; denies shortness; dizziness is intermittent; nausea Sunday but no vomiting;

## 2016-03-09 NOTE — Progress Notes (Signed)
To CT via bed.

## 2016-03-09 NOTE — Progress Notes (Signed)
Ambulated pt per md orders, pain worsening with ambulation, will page Dr. Posey Pronto.

## 2016-03-09 NOTE — Care Management Obs Status (Signed)
[place] NOTIFICATION   Patient Details  Name: Sabrina [name] MRN: [id number] Date of Birth: 09/08/1946   Medicare Observation Status Notification Given:  Yes    Jolly Mango, RN 03/09/2016, 1:43 PM

## 2016-03-09 NOTE — Progress Notes (Signed)
Dr. Posey Pronto called and updated on ambulation of pt and her increase in pain.  New orders received.

## 2016-03-10 DIAGNOSIS — R0789 Other chest pain: Secondary | ICD-10-CM | POA: Diagnosis not present

## 2016-03-10 DIAGNOSIS — I119 Hypertensive heart disease without heart failure: Secondary | ICD-10-CM | POA: Diagnosis not present

## 2016-03-10 DIAGNOSIS — I10 Essential (primary) hypertension: Secondary | ICD-10-CM | POA: Diagnosis not present

## 2016-03-10 DIAGNOSIS — E7800 Pure hypercholesterolemia, unspecified: Secondary | ICD-10-CM | POA: Diagnosis not present

## 2016-03-10 DIAGNOSIS — I208 Other forms of angina pectoris: Secondary | ICD-10-CM | POA: Diagnosis not present

## 2016-03-10 DIAGNOSIS — E119 Type 2 diabetes mellitus without complications: Secondary | ICD-10-CM | POA: Diagnosis not present

## 2016-03-28 ENCOUNTER — Emergency Department: Payer: Commercial Managed Care - HMO

## 2016-03-28 ENCOUNTER — Emergency Department
Admission: EM | Admit: 2016-03-28 | Discharge: 2016-03-31 | Disposition: A | Discharge status: Other Acute Inpatient Hospital | Payer: Commercial Managed Care - HMO | Attending: Emergency Medicine | Admitting: Emergency Medicine

## 2016-03-28 DIAGNOSIS — Z7982 Long term (current) use of aspirin: Secondary | ICD-10-CM | POA: Insufficient documentation

## 2016-03-28 DIAGNOSIS — F29 Unspecified psychosis not due to a substance or known physiological condition: Secondary | ICD-10-CM | POA: Insufficient documentation

## 2016-03-28 DIAGNOSIS — I10 Essential (primary) hypertension: Secondary | ICD-10-CM | POA: Diagnosis not present

## 2016-03-28 DIAGNOSIS — F312 Bipolar disorder, current episode manic severe with psychotic features: Secondary | ICD-10-CM | POA: Diagnosis not present

## 2016-03-28 DIAGNOSIS — Z7984 Long term (current) use of oral hypoglycemic drugs: Secondary | ICD-10-CM | POA: Insufficient documentation

## 2016-03-28 DIAGNOSIS — F23 Brief psychotic disorder: Secondary | ICD-10-CM

## 2016-03-28 DIAGNOSIS — Z9119 Patient's noncompliance with other medical treatment and regimen: Secondary | ICD-10-CM

## 2016-03-28 DIAGNOSIS — Z79899 Other long term (current) drug therapy: Secondary | ICD-10-CM | POA: Diagnosis not present

## 2016-03-28 DIAGNOSIS — E119 Type 2 diabetes mellitus without complications: Secondary | ICD-10-CM | POA: Insufficient documentation

## 2016-03-28 DIAGNOSIS — R4182 Altered mental status, unspecified: Secondary | ICD-10-CM

## 2016-03-28 DIAGNOSIS — Z046 Encounter for general psychiatric examination, requested by authority: Secondary | ICD-10-CM | POA: Diagnosis not present

## 2016-03-28 DIAGNOSIS — Z91199 Patient's noncompliance with other medical treatment and regimen due to unspecified reason: Secondary | ICD-10-CM

## 2016-03-28 LAB — ACETAMINOPHEN LEVEL: Acetaminophen (Tylenol), Serum: <10 ug/mL — ABNORMAL LOW (ref 10–30)

## 2016-03-28 LAB — COMPREHENSIVE METABOLIC PANEL
ALBUMIN: 5 g/dL (ref 3.5–5.0)
ALT: 25 U/L (ref 14–54)
AST: 24 U/L (ref 15–41)
Alkaline Phosphatase: 63 U/L (ref 38–126)
Anion gap: 17 — ABNORMAL HIGH (ref 5–15)
BUN: 22 mg/dL — AB (ref 6–20)
CHLORIDE: 106 mmol/L (ref 101–111)
CO2: 19 mmol/L — ABNORMAL LOW (ref 22–32)
CREATININE: 0.96 mg/dL (ref 0.44–1.00)
Calcium: 10.2 mg/dL (ref 8.9–10.3)
GFR calc Af Amer: >60 mL/min (ref >60)
GFR, EST NON AFRICAN AMERICAN: 59 mL/min — AB (ref >60)
GLUCOSE: 260 mg/dL — AB (ref 65–99)
POTASSIUM: 4.4 mmol/L (ref 3.5–5.1)
Sodium: 142 mmol/L (ref 135–145)
Total Bilirubin: 1.2 mg/dL (ref 0.3–1.2)
Total Protein: 8.1 g/dL (ref 6.5–8.1)

## 2016-03-28 LAB — CBC
HEMATOCRIT: 44.1 % (ref 35.0–47.0)
HEMOGLOBIN: 14.3 g/dL (ref 12.0–16.0)
MCH: 29.3 pg (ref 26.0–34.0)
MCHC: 32.4 g/dL (ref 32.0–36.0)
MCV: 90.6 fL (ref 80.0–100.0)
PLATELETS: 323 10*3/uL (ref 150–440)
RBC: 4.87 MIL/uL (ref 3.80–5.20)
RDW: 13.4 % (ref 11.5–14.5)
WBC: 6.6 10*3/uL (ref 3.6–11.0)

## 2016-03-28 LAB — SALICYLATE LEVEL: Salicylate Lvl: <4 mg/dL (ref 2.8–30.0)

## 2016-03-28 LAB — ETHANOL

## 2016-03-28 MED ORDER — LORAZEPAM 2 MG/ML IJ SOLN
2.0000 mg | Freq: Once | INTRAMUSCULAR | Status: AC
  Administered 2016-03-28: 2 mg via INTRAVENOUS
  Filled 2016-03-28: qty 1

## 2016-03-28 MED ORDER — ARIPIPRAZOLE 5 MG PO TABS
5.0000 mg | ORAL_TABLET | Freq: Two times a day (BID) | ORAL | Status: DC
  Administered 2016-03-29 – 2016-03-30 (×4): 5 mg via ORAL
  Filled 2016-03-28 (×9): qty 1

## 2016-03-28 MED ORDER — DIVALPROEX SODIUM 250 MG PO DR TAB
250.0000 mg | DELAYED_RELEASE_TABLET | Freq: Two times a day (BID) | ORAL | Status: DC
  Administered 2016-03-29 – 2016-03-30 (×3): 250 mg via ORAL
  Filled 2016-03-28 (×5): qty 1

## 2016-03-28 MED ORDER — ZIPRASIDONE MESYLATE 20 MG IM SOLR
20.0000 mg | Freq: Once | INTRAMUSCULAR | Status: AC
  Administered 2016-03-28: 20 mg via INTRAMUSCULAR
  Filled 2016-03-28: qty 20

## 2016-03-28 MED ORDER — HALOPERIDOL LACTATE 5 MG/ML IJ SOLN
INTRAMUSCULAR | Status: AC
  Filled 2016-03-28: qty 1

## 2016-03-28 MED ORDER — HALOPERIDOL LACTATE 5 MG/ML IJ SOLN
5.0000 mg | Freq: Once | INTRAMUSCULAR | Status: AC
  Administered 2016-03-28: 5 mg via INTRAMUSCULAR
  Filled 2016-03-28: qty 1

## 2016-03-28 NOTE — ED Notes (Signed)
Pt Picked up by police lives with daughter who is here. Pt IVC papers stating Hx of Bipolar and currently having a psychotic episode. Pt has been aggressive towards officer. IVC taken out by [facility]. Pt refuses to answer to any questions.

## 2016-03-28 NOTE — ED Notes (Signed)
Patient is IVC and she is pending psych placement R.Geri [place].M.A

## 2016-03-28 NOTE — Consult Note (Signed)
[facility] Face-to-Face Psychiatry Consult   Reason for Consult:  Consult for 70 year old woman with an apparent past history of mental illness brought into the hospital by her daughter Referring Physician:  Marcelene Butte Patient Identification: Sabrina [name] MRN:  [id number] Principal Diagnosis: Bipolar affective disorder, manic, severe, with psychotic behavior ([place]) Diagnosis:   Patient Active Problem List   Diagnosis Date Noted  . Bipolar affective disorder, manic, severe, with psychotic behavior ([place]) [F31.2] 03/28/2016  . Noncompliance [Z91.19] 03/28/2016  . Chest pain at rest [R07.9] 03/09/2016  . Uncontrolled hypertension [I10] 03/09/2016  . Chest pain [R07.9] 03/09/2016    Total Time spent with patient: 1 hour  Subjective:   Sabrina [name] is a 70 y.o. female patient admitted with "don't ask me nothing".  HPI:  Patient interviewed. Daughter was present and I interviewed her to some extent as well. Labs and vitals reviewed. Patient brought in by daughter who reports that for the past couple of weeks or at least since last Sunday the patient has been declining. She's been getting more and more disorganized in her thinking and agitated. The daughter suspects that the patient may be noncompliant with her medicine but she is not certain. There is no evidence of alcohol or drug abuse. On interview today the patient is very disorganized and psychotic and it is impossible to get much useful information. She has clang associations constant distractibility euphoric agitation at times. Admits that she's not been taking her medicine. Has no reason for that. No known recent stress.  Social history: Apparently it is just the patient and her daughter living at home.  Medical history: Patient has a history of chest pain uncontrolled hypertension diabetes multiple serious medical problems chronically. Recent hospitalization for complications of that.  Substance abuse history: No history of alcohol or drug  abuse identified  Past Psychiatric History: This is a little vague. Apparently there is report from the family and the patient that she does have a past history of mental health problems but there is nothing in her current medicines that his psychiatric related. Furthermore there is nothing in her recent hospitalizations to indicate psychiatric symptoms were than anyone knew she had a mental health problem. Patient is such a vague historian it's hard to know what kind of treatment she has had in the past. She refuses to divulge knowing of any psychiatric medicines. Denies suicide attempts.  Risk to Self: Is patient at risk for suicide?: No Risk to Others:   Prior Inpatient Therapy:   Prior Outpatient Therapy:    Past Medical History:  Past Medical History  Diagnosis Date  . Hypertension   . Diabetes mellitus without complication ([place])   . High cholesterol     Past Surgical History  Procedure Laterality Date  . Polypectomy     Family History:  Family History  Problem Relation Age of Onset  . Breast cancer Maternal Aunt     2 aunts in their 9's   Family Psychiatric  History: No known family history Social History:  History  Alcohol Use No     History  Drug Use No    Social History   Social History  . Marital Status: Divorced    Spouse Name: N/A  . Number of Children: N/A  . Years of Education: N/A   Occupational History  . retired    Social History Main Topics  . Smoking status: Never Smoker   . Smokeless tobacco: Not on file  . Alcohol Use: No  .  Drug Use: No  . Sexual Activity: Not on file   Other Topics Concern  . Not on file   Social History Narrative   Additional Social History:    Allergies:   Allergies  Allergen Reactions  . Glimepiride Diarrhea and Other (See Comments)  . Glipizide Other (See Comments)  . Lisinopril     Other reaction(s): Headache  . Losartan     Other reaction(s): Headache  . Pioglitazone Swelling    On legs    Labs:   Results for orders placed or performed during the hospital encounter of 03/28/16 (from the past 48 hour(s))  Comprehensive metabolic panel     Status: Abnormal   Collection Time: 03/28/16  1:14 PM  Result Value Ref Range   Sodium 142 135 - 145 mmol/L   Potassium 4.4 3.5 - 5.1 mmol/L   Chloride 106 101 - 111 mmol/L   CO2 19 (L) 22 - 32 mmol/L   Glucose, Bld 260 (H) 65 - 99 mg/dL   BUN 22 (H) 6 - 20 mg/dL   Creatinine, Ser 0.96 0.44 - 1.00 mg/dL   Calcium 10.2 8.9 - 10.3 mg/dL   Total Protein 8.1 6.5 - 8.1 g/dL   Albumin 5.0 3.5 - 5.0 g/dL   AST 24 15 - 41 U/L   ALT 25 14 - 54 U/L   Alkaline Phosphatase 63 38 - 126 U/L   Total Bilirubin 1.2 0.3 - 1.2 mg/dL   GFR calc non Af Amer 59 (L) >60 mL/min   GFR calc Af Amer >60 >60 mL/min    Comment: (NOTE) The eGFR has been calculated using the CKD EPI equation. This calculation has not been validated in all clinical situations. eGFR's persistently <60 mL/min signify possible Chronic Kidney Disease.    Anion gap 17 (H) 5 - 15  Ethanol     Status: None   Collection Time: 03/28/16  1:14 PM  Result Value Ref Range   Alcohol, Ethyl (B) <5 <5 mg/dL    Comment:        LOWEST DETECTABLE LIMIT FOR SERUM ALCOHOL IS 5 mg/dL FOR MEDICAL PURPOSES ONLY   Salicylate level     Status: None   Collection Time: 03/28/16  1:14 PM  Result Value Ref Range   Salicylate Lvl <8.0 2.8 - 30.0 mg/dL  Acetaminophen level     Status: Abnormal   Collection Time: 03/28/16  1:14 PM  Result Value Ref Range   Acetaminophen (Tylenol), Serum <10 (L) 10 - 30 ug/mL    Comment:        THERAPEUTIC CONCENTRATIONS VARY SIGNIFICANTLY. A RANGE OF 10-30 ug/mL MAY BE AN EFFECTIVE CONCENTRATION FOR MANY PATIENTS. HOWEVER, SOME ARE BEST TREATED AT CONCENTRATIONS OUTSIDE THIS RANGE. ACETAMINOPHEN CONCENTRATIONS >150 ug/mL AT 4 HOURS AFTER INGESTION AND >50 ug/mL AT 12 HOURS AFTER INGESTION ARE OFTEN ASSOCIATED WITH TOXIC REACTIONS.   cbc     Status: None    Collection Time: 03/28/16  1:14 PM  Result Value Ref Range   WBC 6.6 3.6 - 11.0 K/uL   RBC 4.87 3.80 - 5.20 MIL/uL   Hemoglobin 14.3 12.0 - 16.0 g/dL   HCT 44.1 35.0 - 47.0 %   MCV 90.6 80.0 - 100.0 fL   MCH 29.3 26.0 - 34.0 pg   MCHC 32.4 32.0 - 36.0 g/dL   RDW 13.4 11.5 - 14.5 %   Platelets 323 150 - 440 K/uL    Current Facility-Administered Medications  Medication Dose Route Frequency Provider Last  Rate Last Dose  . ARIPiprazole (ABILIFY) tablet 5 mg  5 mg Oral BID AC Alisandra Son T Summar Mcglothlin, MD      . divalproex (DEPAKOTE) DR tablet 250 mg  250 mg Oral Q12H Gonzella Lex, MD       Current Outpatient Prescriptions  Medication Sig Dispense Refill  . aspirin EC 81 MG tablet Take 1 tablet by mouth daily.    . irbesartan (AVAPRO) 150 MG tablet Take 1 tablet by mouth daily.  1  . LEVEMIR FLEXTOUCH 100 UNIT/ML Pen Inject 20 Units into the skin at bedtime.  0  . metFORMIN (GLUCOPHAGE) 500 MG tablet Take 1,000 mg by mouth 2 (two) times daily.  10  . metoprolol succinate (TOPROL-XL) 50 MG 24 hr tablet Take 50 mg by mouth daily.  0  . simvastatin (ZOCOR) 40 MG tablet Take 1 tablet by mouth at bedtime.  4  . spironolactone (ALDACTONE) 25 MG tablet Take 25 mg by mouth daily.  0    Musculoskeletal: Strength & Muscle Tone: within normal limits Gait & Station: normal Patient leans: N/A  Psychiatric Specialty Exam: Physical Exam  Nursing note and vitals reviewed. Constitutional: She appears well-developed and well-nourished.  HENT:  Head: Normocephalic and atraumatic.  Eyes: Conjunctivae are normal. Pupils are equal, round, and reactive to light.  Neck: Normal range of motion.  Cardiovascular: Normal rate.   Respiratory: Effort normal.  GI: Soft.  Musculoskeletal: Normal range of motion.  Neurological: She is alert.  Skin: Skin is warm and dry.  Psychiatric: Her affect is labile and inappropriate. Her speech is tangential. She is agitated. Thought content is paranoid and delusional.  Cognition and memory are impaired. She expresses impulsivity.    Review of Systems  Constitutional: Negative.   HENT: Negative.   Eyes: Negative.   Respiratory: Negative.   Cardiovascular: Negative.   Gastrointestinal: Negative.   Musculoskeletal: Negative.   Skin: Negative.   Neurological: Negative.   Psychiatric/Behavioral: Negative for depression, suicidal ideas, hallucinations, memory loss and substance abuse. The patient is not nervous/anxious and does not have insomnia.     Blood pressure 114/96, pulse 120, temperature 98.3 F (36.8 C), temperature source Oral, resp. rate 16, height _0  (1.549 m), weight 70.308 kg (155 lb), SpO2 98 %.Body mass index is 29.3 kg/(m^2).  General Appearance: Disheveled  Eye Contact:  Good  Speech:  Pressured  Volume:  Decreased  Mood:  Irritable  Affect:  Labile and Full Range  Thought Process:  Irrelevant  Orientation:  Full (Time, Place, and Person)  Thought Content:  Illogical  Suicidal Thoughts:  No  Homicidal Thoughts:  No  Memory:  Negative  Judgement:  Impaired  Insight:  Lacking  Psychomotor Activity:  Normal  Concentration:  Concentration: Poor  Recall:  Poor  Fund of Knowledge:  Fair  Language:  Fair  Akathisia:  No  Handed:  Right  AIMS (if indicated):     Assets:  Housing Social Support  ADL's:  Intact  Cognition:  Impaired,  Mild  Sleep:        Treatment Plan Summary: Daily contact with patient to assess and evaluate symptoms and progress in treatment, Medication management and Plan 70 year old woman who was presented originally to me as having a history of bipolar disorder. I don't really see any clear documentation of that in the old chart. Patient is not really able to give any history. Daughter seems to indicate that there have been mental health problems in the past. Interviewing the patient  she has a mental state that would be consistent with a bipolar mania area also however possibly consistent with organic  features. There is been a recent stress apparently that the daughter has breast cancer and is undergoing treatment that could be an emotional trigger. Labs all reviewed. I'm going to suggest we get a CT scan as well. I'm going to start her on a low dose of Depakote 250 twice a day and Abilify 5 mg twice a day. We can look into referral to geriatric psychiatry units because we have no beds available. If a bed here does become available we can reconsider appropriateness. Patient and daughter are apprised of the plan. Continue IVC.  Disposition: Recommend psychiatric Inpatient admission when medically cleared. Supportive therapy provided about ongoing stressors.  Alethia Berthold, MD 03/28/2016 5:37 PM

## 2016-03-28 NOTE — ED Notes (Signed)
Pt lives with daughter. Per daughter, pts psychotic break was triggered by family problems. Pt daughter newly diagnosed with breast cancer, brother recently had leg amputated, and nephew who pt lives with was expelled from school. Per daughter, pt never uses alcohol or drugs. Per daughter, pt has been talking excessively and thoughts are incoherent. Pt only on medication for blood pressure and diabetes. Per daughter, pt was prescribed Zolof, but has never taken it.

## 2016-03-28 NOTE — ED Provider Notes (Signed)
ED ECG REPORT I, Daymon Larsen, the attending physician, personally viewed and interpreted this ECG.  Date: 03/28/2016 EKG Time: 1736 Rate: 100 Rhythm: normal sinus rhythm QRS Axis: Left axis deviation Intervals: normal ST/T Wave abnormalities: normal Conduction Disturbances: none Narrative Interpretation:  Poor R-wave progression noticed in the anterior leads  Anterior fascicular block Left ventricular hypertrophy Old QRS complexes nose and the inferior leads, no acute ischemic changes  Daymon Larsen, MD 03/28/16 1754

## 2016-03-28 NOTE — ED Notes (Signed)
Patient transported to CT 

## 2016-03-28 NOTE — ED Notes (Signed)
Pt sleeping soundly with occasional snoring resp, breathing unlabored and even

## 2016-03-28 NOTE — BH Assessment (Signed)
Assessment Note  Sabrina [name] is an 70 y.o. female who presents to the ER due to having odd and bizarre behaviors. Patient was at the local park, "talking out of her head and saying things that didn't make sense." When family arrived, law enforcement and EMS was already on site. Mobile Crisis was contacted as well and the worker knows and acknowledge her behaviors weren't normal for her.  Per the report of the patient's daughter Sabrina [name] Slade-[phone]), she noticed a change in her behavior this past Sunday (03/22/2016). She was paranoid, liable and incoherent. She called the police because she saw someone trying to break in their home. Patient reported she saw someone outside the home walking around and then she saw the doorknob turn. Daughter states that would  be impossible. "No one can get to the main door, without opening the screen door first and it was locked." In addition, the patient wasn't in the part of the house, to be able to see the door. Patient has started refusing to eat food without seeing it being prepared. Even with people she knows and trust.   The daughter started reaching out for help from the local community mental health agency ([facility]), when she started noticing the changes. Patient has no history of mental illness or history of her current behaviors. Daughter believes "she's having a nervous break down." There have been several major changes in their family, which may be the cause. This past May (2017), her brother had to get both of his legs amputated and "they both had a hard time adjusting to it." Within the same month, the daughter was diagnosed with breast cancer. She had to have a surgery and planning to start radiation next week. This past Friday, her grandson was suspended from school and it triggered the memory of her son being put into prison. Her son was around the same age, of the grandson, when he started to get in trouble and it started with him being suspend from  school.  Daughter had to start sleeping down stairs, due to the fear of the patient trying to leave, the home again, during the night. She had to have her nephew and niece, stay with another family member for the time being, due to concerns of them getting upset about the patient behaviors.   During the interview, the patient was incoherent and liable. Majority of her answer weren't related to the questions. At times, she became verbally aggressive.  Patient is able to walk and perform her ADL's without assistance. Prior to this week, the patient was driving and doing things for herself.  Daughter Sabrina [name] [phone]) is the main contact person for the patient.  Past Medical History:  Past Medical History  Diagnosis Date  . Hypertension   . Diabetes mellitus without complication ([place])   . High cholesterol     Past Surgical History  Procedure Laterality Date  . Polypectomy      Family History:  Family History  Problem Relation Age of Onset  . Breast cancer Maternal Aunt     2 aunts in their 70's    Social History:  reports that she has never smoked. She does not have any smokeless tobacco history on file. She reports that she does not drink alcohol or use illicit drugs.  Additional Social History:  Alcohol / Drug Use Pain Medications: See PTA Prescriptions: See PTA Over the Counter: See PTA History of alcohol / drug use?: No history of alcohol / drug abuse Longest  period of sobriety (when/how long): Have no history of past of current use Negative Consequences of Use:  (Have no history of past of current use) Withdrawal Symptoms:  (Have no history of past of current use)  CIWA: CIWA-Ar BP: (!) 114/96 mmHg Pulse Rate: (!) 120 COWS:    Allergies:  Allergies  Allergen Reactions  . Glimepiride Diarrhea and Other (See Comments)  . Glipizide Other (See Comments)  . Lisinopril     Other reaction(s): Headache  . Losartan     Other reaction(s): Headache  .  Pioglitazone Swelling    On legs    Home Medications:  (Not in a hospital admission)  OB/GYN Status:  No LMP recorded. Patient is postmenopausal.  General Assessment Data Location of Assessment: [facility] ED TTS Assessment: In system Is this a Tele or Face-to-Face Assessment?: Face-to-Face Is this an Initial Assessment or a Re-assessment for this encounter?: Initial Assessment Marital status: Single Maiden name: n/a Is patient pregnant?: No Pregnancy Status: No Living Arrangements: Children Can pt return to current living arrangement?: Yes Admission Status: Involuntary Is patient capable of signing voluntary admission?: No Referral Source: Self/Family/Friend Insurance type: Medicare  Medical Screening Exam ([place]) Medical Exam completed: Yes  Crisis Care Plan Living Arrangements: Children Legal Guardian: Other: (None) Name of Psychiatrist: Reports of none Name of Therapist: Reports of none  Education Status Is patient currently in school?: No Current Grade: n/a Highest grade of school patient has completed: [employer] Name of school: n/a Contact person: n/a  Risk to self with the past 6 months Suicidal Ideation: No Has patient been a risk to self within the past 6 months prior to admission? : No Suicidal Intent: No Has patient had any suicidal intent within the past 6 months prior to admission? : No Is patient at risk for suicide?: No Suicidal Plan?: No Has patient had any suicidal plan within the past 6 months prior to admission? : No Access to Means: No What has been your use of drugs/alcohol within the last 12 months?: Reports of none Previous Attempts/Gestures: No How many times?: 0 Other Self Harm Risks: Reports of none Triggers for Past Attempts: None known Intentional Self Injurious Behavior: None Family Suicide History: No Recent stressful life event(s): Other (Comment), Loss (Comment) Persecutory voices/beliefs?: No Depression:  Yes Depression Symptoms: Feeling angry/irritable Substance abuse history and/or treatment for substance abuse?: No Suicide prevention information given to non-admitted patients: Not applicable  Risk to Others within the past 6 months Homicidal Ideation: No Does patient have any lifetime risk of violence toward others beyond the six months prior to admission? : No Thoughts of Harm to Others: No Current Homicidal Intent: No Current Homicidal Plan: No Access to Homicidal Means: No Identified Victim: Reports of none History of harm to others?: No Assessment of Violence: In distant past Violent Behavior Description: Was agitated upon arrival to the ER Does patient have access to weapons?: No Criminal Charges Pending?: No Does patient have a court date: No Is patient on probation?: No  Psychosis Hallucinations: None noted Delusions: None noted  Mental Status Report Appearance/Hygiene: In scrubs, In hospital gown, Unremarkable Eye Contact: Fair Motor Activity: Freedom of movement, Unremarkable Speech: Incoherent, Argumentative, Loud Level of Consciousness: Alert, Irritable, Restless Mood: Anxious, Irritable, Angry, Labile Affect: Fearful, Irritable, Frightened, Labile, Anxious, Apprehensive Anxiety Level: Moderate Thought Processes: Irrelevant, Flight of Ideas, Tangential Judgement: Impaired Orientation: Person, Place Obsessive Compulsive Thoughts/Behaviors: Minimal  Cognitive Functioning Concentration: Decreased Memory: Recent Impaired, Remote Impaired IQ: Average Insight:  Poor Impulse Control: Poor Appetite: Fair Weight Loss: 0 Weight Gain: 0 Sleep: Decreased Total Hours of Sleep: 3 Vegetative Symptoms: None  ADLScreening [facility] Assessment Services) Patient's cognitive ability adequate to safely complete daily activities?: Yes Patient able to express need for assistance with ADLs?: Yes Independently performs ADLs?: Yes (appropriate for developmental age)  Prior  Inpatient Therapy Prior Inpatient Therapy: No Prior Therapy Dates: Reports of none Prior Therapy Facilty/Provider(s): Reports of none Reason for Treatment: Reports of none  Prior Outpatient Therapy Prior Outpatient Therapy: No Prior Therapy Dates: Reports of none Prior Therapy Facilty/Provider(s): Reports of none Reason for Treatment: Reports of none Does patient have an ACCT team?: No Does patient have Intensive In-House Services?  : No Does patient have [facility] services? : No Does patient have P4CC services?: No  ADL Screening (condition at time of admission) Patient's cognitive ability adequate to safely complete daily activities?: Yes Is the patient deaf or have difficulty hearing?: No Does the patient have difficulty seeing, even when wearing glasses/contacts?: No Does the patient have difficulty concentrating, remembering, or making decisions?: No Patient able to express need for assistance with ADLs?: Yes Does the patient have difficulty dressing or bathing?: No Independently performs ADLs?: Yes (appropriate for developmental age) Does the patient have difficulty walking or climbing stairs?: No Weakness of Legs: None Weakness of Arms/Hands: None  Home Assistive Devices/Equipment Home Assistive Devices/Equipment: None  Therapy Consults (therapy consults require a physician order) PT Evaluation Needed: No OT Evalulation Needed: No SLP Evaluation Needed: No Abuse/Neglect Assessment (Assessment to be complete while patient is alone) Physical Abuse: Denies Verbal Abuse: Denies Sexual Abuse: Denies Exploitation of patient/patient's resources: Denies Self-Neglect: Denies Values / Beliefs Cultural Requests During Hospitalization: None Spiritual Requests During Hospitalization: None Consults Spiritual Care Consult Needed: No Social Work Consult Needed: No      Additional Information 1:1 In Past 12 Months?: No CIRT Risk: No Elopement Risk: No Does patient have  medical clearance?: Yes  Child/Adolescent Assessment Running Away Risk: Denies (Patient is an adult)  Disposition:  Disposition Initial Assessment Completed for this Encounter: Yes Disposition of Patient: Other dispositions (ER Ordered Psych Consult) Other disposition(s): Other (Comment) (ER Ordered Psych Consult)  On Site Evaluation by:   Reviewed with Physician:    Gunnar Fusi MS, LCAS, LPC, [place], CCSI Therapeutic Triage Specialist 03/28/2016 7:27 PM

## 2016-03-28 NOTE — ED Notes (Addendum)
Sabrina [name]  [phone] Home  [phone] Daughter taking clothes and shoes

## 2016-03-28 NOTE — ED Notes (Signed)
Pt non cooperative. Needs x-ray and EKG for placement. Pt refusing.
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
## 2016-03-29 LAB — GLUCOSE, CAPILLARY
Glucose-Capillary: 218 mg/dL — ABNORMAL HIGH (ref 65–99)
Glucose-Capillary: 241 mg/dL — ABNORMAL HIGH (ref 65–99)

## 2016-03-29 MED ORDER — INSULIN DETEMIR 100 UNIT/ML ~~LOC~~ SOLN
20.0000 [IU] | Freq: Every day | SUBCUTANEOUS | Status: DC
  Administered 2016-03-29 – 2016-03-30 (×2): 20 [IU] via SUBCUTANEOUS
  Filled 2016-03-29 (×4): qty 0.2

## 2016-03-29 MED ORDER — SIMVASTATIN 40 MG PO TABS
40.0000 mg | ORAL_TABLET | Freq: Every day | ORAL | Status: DC
  Administered 2016-03-29 – 2016-03-30 (×2): 40 mg via ORAL
  Filled 2016-03-29 (×2): qty 1

## 2016-03-29 MED ORDER — METFORMIN HCL 500 MG PO TABS
1000.0000 mg | ORAL_TABLET | Freq: Two times a day (BID) | ORAL | Status: DC
  Administered 2016-03-29 – 2016-03-31 (×5): 1000 mg via ORAL
  Filled 2016-03-29 (×5): qty 2

## 2016-03-29 MED ORDER — IRBESARTAN 150 MG PO TABS
150.0000 mg | ORAL_TABLET | Freq: Every day | ORAL | Status: DC
  Administered 2016-03-29 – 2016-03-30 (×2): 150 mg via ORAL
  Filled 2016-03-29 (×4): qty 1

## 2016-03-29 MED ORDER — ASPIRIN EC 81 MG PO TBEC
81.0000 mg | DELAYED_RELEASE_TABLET | Freq: Every day | ORAL | Status: DC
  Administered 2016-03-29 – 2016-03-31 (×3): 81 mg via ORAL
  Filled 2016-03-29 (×3): qty 1

## 2016-03-29 MED ORDER — METOPROLOL SUCCINATE ER 50 MG PO TB24
50.0000 mg | ORAL_TABLET | Freq: Every day | ORAL | Status: DC
  Administered 2016-03-29 – 2016-03-30 (×2): 50 mg via ORAL
  Filled 2016-03-29 (×3): qty 1

## 2016-03-29 MED ORDER — SPIRONOLACTONE 25 MG PO TABS
25.0000 mg | ORAL_TABLET | Freq: Every day | ORAL | Status: DC
  Administered 2016-03-29 – 2016-03-31 (×3): 25 mg via ORAL
  Filled 2016-03-29 (×4): qty 1

## 2016-03-29 MED ORDER — INSULIN DETEMIR 100 UNIT/ML FLEXPEN: 20.0000 [IU] | PEN_INJECTOR | Freq: Every day | SUBCUTANEOUS | Status: DC

## 2016-03-29 NOTE — ED Notes (Signed)
Pt. given bath and fresh linens.

## 2016-03-29 NOTE — BHH Counselor (Addendum)
Pt referral faxed to the following facilities for Geriatric Inpatient Tx:  [place]: ([phone]) [facility]: [phone]) [facility]: [phone])

## 2016-03-29 NOTE — ED Notes (Addendum)
Woke pt up to administer medication. States she does not take these medication however was willing to take. MAR confirmed and medication administered. States was not given breakfast tray. Breakfast tray at bedside. Pt states tray not there prior. Offered sandwich tray and pt accepted. Katie EDT to give tray. Will continue to assess

## 2016-03-29 NOTE — BH Assessment (Signed)
Writer called and left a HIPPA Compliant message with patient's daughter [name] [phone]), requesting a return phone call.

## 2016-03-29 NOTE — ED Provider Notes (Signed)
-----------------------------------------   9:00 AM on 03/29/2016 -----------------------------------------   Blood pressure 113/70, pulse 88, temperature 97.6 F (36.4 C), temperature source Oral, resp. rate 17, height 5\' 1"  (1.549 m), weight 155 lb (70.308 kg), SpO2 99 %.  The patient had no acute events since last update.  Calm and cooperative at this time.  Disposition is pending per Psychiatry/Behavioral Medicine team recommendations.     Joanne Gavel, MD 03/29/16 0900

## 2016-03-29 NOTE — BH Assessment (Addendum)
Patient has been accepted to [facility].  Patient assigned to "Geriatric Unit" Accepting physician is Dr. Bjorn Loser.  Call report to [phone].  Representative was [place].  ER Staff is aware of it Edd Arbour, ER Sect.; Dr. Edd Fabian, ER MD & Yong Channel, Patient's Nurse)    Patient's daugther [name] [phone]) have been updated as well.   Daughter was wanting patient to be admitted at [facility]. It would be better for her and the family due to the current things they have going on. Writer explained to her, we had to go with the first available bed. Writer further explain to her, if anything changes he would let her know, but at this time [facility] is the only bed that is available.   Patient bed will be available tomorrow (03/30/2016) anytime after 1pm.

## 2016-03-30 ENCOUNTER — Encounter: Payer: Self-pay | Admitting: Emergency Medicine

## 2016-03-30 LAB — GLUCOSE, CAPILLARY: GLUCOSE-CAPILLARY: 178 mg/dL — AB (ref 65–99)

## 2016-03-30 NOTE — Progress Notes (Signed)
TTS has decline bed offer at [facility]. Pt will be admitted her at [facility]    Patient is to be admitted to [place] by Dr. Weber Cooks.  Attending Physician will be Dr. Bary Leriche.   Patient has been assigned to room 311, by [place] Nurse Anderson Malta.   Intake Paper Work has been signed and placed on patient chart.  ER staff is aware of the admission [facility] ER Sect.; ER MD; [place]  Patient Access).     03/30/2016 Con Memos, MS, [place], [facility] Therapeutic Triage Specialist
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
## 2016-03-30 NOTE — ED Provider Notes (Signed)
-----------------------------------------   6:38 AM on 03/30/2016 -----------------------------------------   Blood pressure 106/58, pulse 95, temperature 98.3 F (36.8 C), temperature source Oral, resp. rate 18, height 5\' 1"  (1.549 m), weight 155 lb (70.308 kg), SpO2 96 %.  The patient had no acute events since last update.  Calm and cooperative at this time.  Disposition is pending per Psychiatry/Behavioral Medicine team recommendations.     Paulette Blanch, MD 03/30/16 [phone]

## 2016-03-30 NOTE — ED Notes (Signed)
Pt to be admitted to [facility]  - bed has been assigned and I am waiting for TTS/Nicole to call me when bed is clean and I can call report

## 2016-03-30 NOTE — ED Notes (Signed)
BEHAVIORAL HEALTH ROUNDING Patient sleeping: No. Patient alert and oriented: yes Behavior appropriate: Yes.  ; If no, describe:  Nutrition and fluids offered: yes Toileting and hygiene offered: Yes  Sitter present: q15 minute observations and security  monitoring Law enforcement present: Yes  ODS  

## 2016-03-30 NOTE — ED Notes (Signed)
Pt is quite paranoid - states won't drink diet ginger ale unless it's opened in front of her, Pronouns use in pt personal hx make story difficult to follow and tangential, pt appears surly distrustful

## 2016-03-30 NOTE — ED Notes (Signed)

## 2016-03-30 NOTE — ED Notes (Signed)
ED BHU [place] Is the patient under IVC or is there intent for IVC: Yes.   Is the patient medically cleared: Yes.   Is there vacancy in the ED BHU: Yes.   Is the population mix appropriate for patient: Yes.   Is the patient awaiting placement in inpatient or outpatient setting: Yes.   inpt [facility]  Has the patient had a psychiatric consult: Yes.   Survey of unit performed for contraband, proper placement and condition of furniture, tampering with fixtures in bathroom, shower, and each patient room: Yes.  ; Findings:  APPEARANCE/BEHAVIOR Calm and cooperative NEURO ASSESSMENT Orientation: oriented x4  Denies pain Hallucinations: No.None noted (Hallucinations) Speech: Normal Gait: normal RESPIRATORY ASSESSMENT Even  Unlabored respirations  CARDIOVASCULAR ASSESSMENT Pulses equal   regular rate  Skin warm and dry   GASTROINTESTINAL ASSESSMENT no GI complaint EXTREMITIES Full ROM  PLAN OF CARE Provide calm/safe environment. Vital signs assessed twice daily. ED BHU Assessment once each 12-hour shift. Collaborate with TTS daily or as condition indicates. Assure the ED provider has rounded once each shift. Provide and encourage hygiene. Provide redirection as needed. Assess for escalating behavior; address immediately and inform ED provider.  Assess family dynamic and appropriateness for visitation as needed: Yes.  ; If necessary, describe findings:  Educate the patient/family about BHU procedures/visitation: Yes.  ; If necessary, describe findings:

## 2016-03-30 NOTE — ED Notes (Signed)
Patient observed lying in bed with eyes closed  Even, unlabored respirations observed   NAD pt appears to be sleeping  I will continue to monitor along with every 15 minute visual observations and ongoing security monitoring    

## 2016-03-30 NOTE — Consult Note (Signed)
[facility] Face-to-Face Psychiatry Consult   Reason for Consult:  Consult for 70 year old woman with an apparent past history of mental illness brought into the hospital by her daughter Referring Physician:  Marcelene Butte Patient Identification: Sabrina [name] MRN:  [id number] Principal Diagnosis: Bipolar affective disorder, manic, severe, with psychotic behavior ([place]) Diagnosis:   Patient Active Problem List   Diagnosis Date Noted  . Bipolar affective disorder, manic, severe, with psychotic behavior ([place]) [F31.2] 03/28/2016  . Noncompliance [Z91.19] 03/28/2016  . Chest pain at rest [R07.9] 03/09/2016  . Uncontrolled hypertension [I10] 03/09/2016  . Chest pain [R07.9] 03/09/2016    Total Time spent with patient: 20 minutes  Subjective:   Sabrina [name] is a 70 y.o. female patient admitted with "don't ask me nothing".  Follow-up for 70 year old woman with bipolar disorder in the emergency room. Patient has been waiting in the emergency room for referral to appropriate admission. She has continued on her medicines for psychotic bipolar disorder. No behavior problems. On interview today she continues to be disorganized and confused. Inappropriate upbeat affect. Blood sugars are running in the high 100s to 200s. Vital signs stable. Continues to have poor insight and to require admission  HPI:  Patient interviewed. Daughter was present and I interviewed her to some extent as well. Labs and vitals reviewed. Patient brought in by daughter who reports that for the past couple of weeks or at least since last Sunday the patient has been declining. She's been getting more and more disorganized in her thinking and agitated. The daughter suspects that the patient may be noncompliant with her medicine but she is not certain. There is no evidence of alcohol or drug abuse. On interview today the patient is very disorganized and psychotic and it is impossible to get much useful information. She has clang associations  constant distractibility euphoric agitation at times. Admits that she's not been taking her medicine. Has no reason for that. No known recent stress.  Social history: Apparently it is just the patient and her daughter living at home.  Medical history: Patient has a history of chest pain uncontrolled hypertension diabetes multiple serious medical problems chronically. Recent hospitalization for complications of that.  Substance abuse history: No history of alcohol or drug abuse identified  Past Psychiatric History: This is a little vague. Apparently there is report from the family and the patient that she does have a past history of mental health problems but there is nothing in her current medicines that his psychiatric related. Furthermore there is nothing in her recent hospitalizations to indicate psychiatric symptoms were than anyone knew she had a mental health problem. Patient is such a vague historian it's hard to know what kind of treatment she has had in the past. She refuses to divulge knowing of any psychiatric medicines. Denies suicide attempts.  Risk to Self: Suicidal Ideation: No Suicidal Intent: No Is patient at risk for suicide?: No Suicidal Plan?: No Access to Means: No What has been your use of drugs/alcohol within the last 12 months?: Reports of none How many times?: 0 Other Self Harm Risks: Reports of none Triggers for Past Attempts: None known Intentional Self Injurious Behavior: None Risk to Others: Homicidal Ideation: No Thoughts of Harm to Others: No Current Homicidal Intent: No Current Homicidal Plan: No Access to Homicidal Means: No Identified Victim: Reports of none History of harm to others?: No Assessment of Violence: In distant past Violent Behavior Description: Was agitated upon arrival to the ER Does patient have access to  weapons?: No Criminal Charges Pending?: No Does patient have a court date: No Prior Inpatient Therapy: Prior Inpatient Therapy:  No Prior Therapy Dates: Reports of none Prior Therapy Facilty/Provider(s): Reports of none Reason for Treatment: Reports of none Prior Outpatient Therapy: Prior Outpatient Therapy: No Prior Therapy Dates: Reports of none Prior Therapy Facilty/Provider(s): Reports of none Reason for Treatment: Reports of none Does patient have an ACCT team?: No Does patient have Intensive In-House Services?  : No Does patient have [facility] services? : No Does patient have P4CC services?: No  Past Medical History:  Past Medical History  Diagnosis Date  . Hypertension   . Diabetes mellitus without complication ([place])   . High cholesterol     Past Surgical History  Procedure Laterality Date  . Polypectomy     Family History:  Family History  Problem Relation Age of Onset  . Breast cancer Maternal Aunt     2 aunts in their 38's   Family Psychiatric  History: No known family history Social History:  History  Alcohol Use No     History  Drug Use No    Social History   Social History  . Marital Status: Divorced    Spouse Name: N/A  . Number of Children: N/A  . Years of Education: N/A   Occupational History  . retired    Social History Main Topics  . Smoking status: Never Smoker   . Smokeless tobacco: Not on file  . Alcohol Use: No  . Drug Use: No  . Sexual Activity: Not on file   Other Topics Concern  . Not on file   Social History Narrative   Additional Social History:    Allergies:   Allergies  Allergen Reactions  . Glimepiride Diarrhea and Other (See Comments)  . Glipizide Other (See Comments)  . Lisinopril     Other reaction(s): Headache  . Losartan     Other reaction(s): Headache  . Pioglitazone Swelling    On legs    Labs:  Results for orders placed or performed during the hospital encounter of 03/28/16 (from the past 48 hour(s))  Comprehensive metabolic panel     Status: Abnormal   Collection Time: 03/28/16  1:14 PM  Result Value Ref Range   Sodium 142  135 - 145 mmol/L   Potassium 4.4 3.5 - 5.1 mmol/L   Chloride 106 101 - 111 mmol/L   CO2 19 (L) 22 - 32 mmol/L   Glucose, Bld 260 (H) 65 - 99 mg/dL   BUN 22 (H) 6 - 20 mg/dL   Creatinine, Ser 0.96 0.44 - 1.00 mg/dL   Calcium 10.2 8.9 - 10.3 mg/dL   Total Protein 8.1 6.5 - 8.1 g/dL   Albumin 5.0 3.5 - 5.0 g/dL   AST 24 15 - 41 U/L   ALT 25 14 - 54 U/L   Alkaline Phosphatase 63 38 - 126 U/L   Total Bilirubin 1.2 0.3 - 1.2 mg/dL   GFR calc non Af Amer 59 (L) >60 mL/min   GFR calc Af Amer >60 >60 mL/min    Comment: (NOTE) The eGFR has been calculated using the CKD EPI equation. This calculation has not been validated in all clinical situations. eGFR's persistently <60 mL/min signify possible Chronic Kidney Disease.    Anion gap 17 (H) 5 - 15  Ethanol     Status: None   Collection Time: 03/28/16  1:14 PM  Result Value Ref Range   Alcohol, Ethyl (B) <5 <5 mg/dL  Comment:        LOWEST DETECTABLE LIMIT FOR SERUM ALCOHOL IS 5 mg/dL FOR MEDICAL PURPOSES ONLY   Salicylate level     Status: None   Collection Time: 03/28/16  1:14 PM  Result Value Ref Range   Salicylate Lvl <9.7 2.8 - 30.0 mg/dL  Acetaminophen level     Status: Abnormal   Collection Time: 03/28/16  1:14 PM  Result Value Ref Range   Acetaminophen (Tylenol), Serum <10 (L) 10 - 30 ug/mL    Comment:        THERAPEUTIC CONCENTRATIONS VARY SIGNIFICANTLY. A RANGE OF 10-30 ug/mL MAY BE AN EFFECTIVE CONCENTRATION FOR MANY PATIENTS. HOWEVER, SOME ARE BEST TREATED AT CONCENTRATIONS OUTSIDE THIS RANGE. ACETAMINOPHEN CONCENTRATIONS >150 ug/mL AT 4 HOURS AFTER INGESTION AND >50 ug/mL AT 12 HOURS AFTER INGESTION ARE OFTEN ASSOCIATED WITH TOXIC REACTIONS.   cbc     Status: None   Collection Time: 03/28/16  1:14 PM  Result Value Ref Range   WBC 6.6 3.6 - 11.0 K/uL   RBC 4.87 3.80 - 5.20 MIL/uL   Hemoglobin 14.3 12.0 - 16.0 g/dL   HCT 44.1 35.0 - 47.0 %   MCV 90.6 80.0 - 100.0 fL   MCH 29.3 26.0 - 34.0 pg   MCHC  32.4 32.0 - 36.0 g/dL   RDW 13.4 11.5 - 14.5 %   Platelets 323 150 - 440 K/uL  Glucose, capillary     Status: Abnormal   Collection Time: 03/29/16 12:54 PM  Result Value Ref Range   Glucose-Capillary 218 (H) 65 - 99 mg/dL  Glucose, capillary     Status: Abnormal   Collection Time: 03/29/16 10:26 PM  Result Value Ref Range   Glucose-Capillary 241 (H) 65 - 99 mg/dL  Glucose, capillary     Status: Abnormal   Collection Time: 03/30/16  8:51 AM  Result Value Ref Range   Glucose-Capillary 178 (H) 65 - 99 mg/dL   Comment 1 Notify RN     Current Facility-Administered Medications  Medication Dose Route Frequency Provider Last Rate Last Dose  . ARIPiprazole (ABILIFY) tablet 5 mg  5 mg Oral BID AC Gonzella Lex, MD   5 mg at 03/30/16 0907  . aspirin EC tablet 81 mg  81 mg Oral Daily Joanne Gavel, MD   81 mg at 03/30/16 0905  . divalproex (DEPAKOTE) DR tablet 250 mg  250 mg Oral Q12H Gonzella Lex, MD   250 mg at 03/30/16 0906  . insulin detemir (LEVEMIR) injection 20 Units  20 Units Subcutaneous QHS Lenis Noon, [facility]   20 Units at 03/29/16 2232  . irbesartan (AVAPRO) tablet 150 mg  150 mg Oral Daily Joanne Gavel, MD   150 mg at 03/30/16 0907  . metFORMIN (GLUCOPHAGE) tablet 1,000 mg  1,000 mg Oral BID Joanne Gavel, MD   1,000 mg at 03/30/16 0906  . metoprolol succinate (TOPROL-XL) 24 hr tablet 50 mg  50 mg Oral Daily Joanne Gavel, MD   50 mg at 03/30/16 0907  . simvastatin (ZOCOR) tablet 40 mg  40 mg Oral QHS Joanne Gavel, MD   40 mg at 03/29/16 2202  . spironolactone (ALDACTONE) tablet 25 mg  25 mg Oral Daily Joanne Gavel, MD   25 mg at 03/30/16 6734   Current Outpatient Prescriptions  Medication Sig Dispense Refill  . aspirin EC 81 MG tablet Take 1 tablet by mouth daily.    . irbesartan (AVAPRO) 150 MG  tablet Take 1 tablet by mouth daily.  1  . LEVEMIR FLEXTOUCH 100 UNIT/ML Pen Inject 20 Units into the skin at bedtime.  0  . metFORMIN (GLUCOPHAGE) 500 MG tablet Take 1,000 mg by  mouth 2 (two) times daily.  10  . metoprolol succinate (TOPROL-XL) 50 MG 24 hr tablet Take 50 mg by mouth daily.  0  . simvastatin (ZOCOR) 40 MG tablet Take 1 tablet by mouth at bedtime.  4  . spironolactone (ALDACTONE) 25 MG tablet Take 25 mg by mouth daily.  0    Musculoskeletal: Strength & Muscle Tone: within normal limits Gait & Station: normal Patient leans: N/A  Psychiatric Specialty Exam: Physical Exam  Nursing note and vitals reviewed. Constitutional: She appears well-developed and well-nourished.  HENT:  Head: Normocephalic and atraumatic.  Eyes: Conjunctivae are normal. Pupils are equal, round, and reactive to light.  Neck: Normal range of motion.  Cardiovascular: Normal rate and normal heart sounds.   Respiratory: Effort normal.  GI: Soft.  Musculoskeletal: Normal range of motion.  Neurological: She is alert.  Skin: Skin is warm and dry.  Psychiatric: Her affect is labile and inappropriate. Her speech is tangential. She is agitated. Thought content is paranoid and delusional. Cognition and memory are impaired. She expresses impulsivity.    Review of Systems  Constitutional: Negative.   HENT: Negative.   Eyes: Negative.   Respiratory: Negative.   Cardiovascular: Negative.   Gastrointestinal: Negative.   Musculoskeletal: Negative.   Skin: Negative.   Neurological: Negative.   Psychiatric/Behavioral: Negative for depression, suicidal ideas, hallucinations, memory loss and substance abuse. The patient is not nervous/anxious and does not have insomnia.     Blood pressure 122/62, pulse 85, temperature 97.9 F (36.6 C), temperature source Oral, resp. rate 17, height '5\' 1"'  (1.549 m), weight 70.308 kg (155 lb), SpO2 98 %.Body mass index is 29.3 kg/(m^2).  General Appearance: Disheveled  Eye Contact:  Good  Speech:  Pressured  Volume:  Decreased  Mood:  Irritable  Affect:  Labile and Full Range  Thought Process:  Irrelevant  Orientation:  Full (Time, Place, and  Person)  Thought Content:  Illogical  Suicidal Thoughts:  No  Homicidal Thoughts:  No  Memory:  Negative  Judgement:  Impaired  Insight:  Lacking  Psychomotor Activity:  Normal  Concentration:  Concentration: Poor  Recall:  Poor  Fund of Knowledge:  Fair  Language:  Fair  Akathisia:  No  Handed:  Right  AIMS (if indicated):     Assets:  Housing Social Support  ADL's:  Intact  Cognition:  Impaired,  Mild  Sleep:        Treatment Plan Summary: Daily contact with patient to assess and evaluate symptoms and progress in treatment, Medication management and Plan Patient requires inpatient level treatment. We had no beds available over the weekend. Referrals have been made to other facilities and a bed offer was obtained for [facility] but transportation was not available over the weekend. This morning it appears that beds will be available on our psychiatry unit fairly soon. I've spoken to nursing downstairs and TTS. Family would very much prefer the patient be admitted to our hospital because of geographic concerns. I have instructed TTS to please decline the bed offer from [facility] and have placed orders for admission to our hospital. Medications can be adjusted by treatment team downstairs. May want to consider medicine consult for sugars.  Disposition: Recommend psychiatric Inpatient admission when medically cleared. Supportive therapy  provided about ongoing stressors.  Alethia Berthold, MD 03/30/2016 9:37 AM

## 2016-03-30 NOTE — ED Notes (Signed)
pt ambulatory to toilet with standby assist, no difficulty noted

## 2016-03-30 NOTE — ED Notes (Signed)
Pt has a visit from her daughter [name]  - update provided    Pt observed with no unusual behavior  Appropriate to stimulation  No verbalized needs or concerns at this time  NAD assessed  Continue to monitor

## 2016-03-30 NOTE — Progress Notes (Signed)
Inpatient Diabetes Program Recommendations  AACE/ADA: New Consensus Statement on Inpatient Glycemic Control (2015)  Target Ranges:  Prepandial:   less than 140 mg/dL      Peak postprandial:   less than 180 mg/dL (1-2 hours)      Critically ill patients:  140 - 180 mg/dL  Results for Sabrina [name], Sabrina [name] (MRN [id number]) as of 03/30/2016 12:04  Ref. Range 03/29/2016 12:54 03/29/2016 22:26 03/30/2016 08:51  Glucose-Capillary Latest Ref Range: 65-99 mg/dL 218 (H) 241 (H) 178 (H)   Results for Sabrina [name], Sabrina [name] (MRN [id number]) as of 03/30/2016 12:04  Ref. Range 03/28/2016 13:14  Glucose Latest Ref Range: 65-99 mg/dL 260 (H)    Review of Glycemic Control  Diabetes history: DM2 Outpatient Diabetes medications: Metformin 1000 BID, Levemir 20 units QHS Current orders for Inpatient glycemic control: Metformin 1000 mg BID, Levemir 20 units QHS  Inpatient Diabetes Program Recommendations: Correction (SSI): Please consider ordering CBGs with Novolog correction scale ACHS. HgbA1C: Please consider ordering an A1C to evaluate glycemic control over the past 2-3 months. Diet: Please change diet from Regular to Carb Modified.  Thanks, Barnie Alderman, RN, MSN, CDE Diabetes Coordinator Inpatient Diabetes Program [phone] (Team Pager from [place] to [place]) [phone] ([facility] office) [phone] [facility] office) [phone] [facility] office)

## 2016-03-30 NOTE — ED Notes (Signed)
Due to "flooding/water issues" iin the [facility] BMU  - pt admission will be delayed  - security reports that it has been leaking since Friday

## 2016-03-30 NOTE — ED Notes (Signed)
Pt transferred to [facility]. Pt ambulatory without difficulty, steady gait noted.

## 2016-03-31 ENCOUNTER — Inpatient Hospital Stay
Admission: EM | Admit: 2016-03-31 | Discharge: 2016-04-02 | DRG: 885 | Disposition: A | Discharge status: Home / Self Care | Payer: Commercial Managed Care - HMO | Source: Intra-hospital | Attending: Psychiatry | Admitting: Psychiatry

## 2016-03-31 DIAGNOSIS — F419 Anxiety disorder, unspecified: Secondary | ICD-10-CM | POA: Diagnosis present

## 2016-03-31 DIAGNOSIS — E11649 Type 2 diabetes mellitus with hypoglycemia without coma: Secondary | ICD-10-CM

## 2016-03-31 DIAGNOSIS — I10 Essential (primary) hypertension: Secondary | ICD-10-CM | POA: Diagnosis present

## 2016-03-31 DIAGNOSIS — F312 Bipolar disorder, current episode manic severe with psychotic features: Secondary | ICD-10-CM | POA: Insufficient documentation

## 2016-03-31 DIAGNOSIS — Z79899 Other long term (current) drug therapy: Secondary | ICD-10-CM | POA: Diagnosis not present

## 2016-03-31 DIAGNOSIS — E7800 Pure hypercholesterolemia, unspecified: Secondary | ICD-10-CM | POA: Diagnosis not present

## 2016-03-31 DIAGNOSIS — E785 Hyperlipidemia, unspecified: Secondary | ICD-10-CM | POA: Diagnosis present

## 2016-03-31 DIAGNOSIS — F23 Brief psychotic disorder: Principal | ICD-10-CM | POA: Diagnosis present

## 2016-03-31 DIAGNOSIS — Z7984 Long term (current) use of oral hypoglycemic drugs: Secondary | ICD-10-CM | POA: Diagnosis not present

## 2016-03-31 DIAGNOSIS — E1169 Type 2 diabetes mellitus with other specified complication: Secondary | ICD-10-CM

## 2016-03-31 DIAGNOSIS — Z7982 Long term (current) use of aspirin: Secondary | ICD-10-CM | POA: Diagnosis not present

## 2016-03-31 DIAGNOSIS — E119 Type 2 diabetes mellitus without complications: Secondary | ICD-10-CM | POA: Diagnosis present

## 2016-03-31 DIAGNOSIS — Z803 Family history of malignant neoplasm of breast: Secondary | ICD-10-CM | POA: Diagnosis not present

## 2016-03-31 DIAGNOSIS — Z9119 Patient's noncompliance with other medical treatment and regimen: Secondary | ICD-10-CM

## 2016-03-31 DIAGNOSIS — F29 Unspecified psychosis not due to a substance or known physiological condition: Secondary | ICD-10-CM | POA: Diagnosis not present

## 2016-03-31 DIAGNOSIS — Z91199 Patient's noncompliance with other medical treatment and regimen due to unspecified reason: Secondary | ICD-10-CM

## 2016-03-31 LAB — GLUCOSE, CAPILLARY
GLUCOSE-CAPILLARY: 116 mg/dL — AB (ref 65–99)
GLUCOSE-CAPILLARY: 113 mg/dL — AB (ref 65–99)
Glucose-Capillary: 108 mg/dL — ABNORMAL HIGH (ref 65–99)

## 2016-03-31 LAB — LIPID PANEL
CHOLESTEROL: 211 mg/dL — AB (ref 0–200)
HDL: 41 mg/dL (ref >40)
LDL Cholesterol: 111 mg/dL — ABNORMAL HIGH (ref 0–99)
TRIGLYCERIDES: 295 mg/dL — AB (ref <150)
Total CHOL/HDL Ratio: 5.1 RATIO
VLDL: 59 mg/dL — ABNORMAL HIGH (ref 0–40)

## 2016-03-31 LAB — TSH: TSH: 0.753 u[IU]/mL (ref 0.350–4.500)

## 2016-03-31 MED ORDER — DIVALPROEX SODIUM 250 MG PO DR TAB
250.0000 mg | DELAYED_RELEASE_TABLET | Freq: Two times a day (BID) | ORAL | Status: DC
  Filled 2016-03-31 (×4): qty 1

## 2016-03-31 MED ORDER — SPIRONOLACTONE 25 MG PO TABS
25.0000 mg | ORAL_TABLET | Freq: Every day | ORAL | Status: DC
  Administered 2016-04-01 – 2016-04-02 (×2): 25 mg via ORAL
  Filled 2016-03-31 (×2): qty 1

## 2016-03-31 MED ORDER — METOPROLOL SUCCINATE ER 50 MG PO TB24
50.0000 mg | ORAL_TABLET | Freq: Every day | ORAL | Status: DC
  Administered 2016-04-01 – 2016-04-02 (×2): 50 mg via ORAL
  Filled 2016-03-31 (×2): qty 1

## 2016-03-31 MED ORDER — MAGNESIUM HYDROXIDE 400 MG/5ML PO SUSP: 30.0000 mL | Freq: Every day | ORAL | Status: DC | PRN

## 2016-03-31 MED ORDER — TRAZODONE HCL 50 MG PO TABS: 50.0000 mg | ORAL_TABLET | Freq: Every evening | ORAL | Status: DC | PRN

## 2016-03-31 MED ORDER — ACETAMINOPHEN 325 MG PO TABS: 650.0000 mg | ORAL_TABLET | Freq: Four times a day (QID) | ORAL | Status: DC | PRN

## 2016-03-31 MED ORDER — INSULIN DETEMIR 100 UNIT/ML ~~LOC~~ SOLN
20.0000 [IU] | Freq: Every day | SUBCUTANEOUS | Status: DC
  Administered 2016-03-31 – 2016-04-01 (×2): 20 [IU] via SUBCUTANEOUS
  Filled 2016-03-31 (×4): qty 0.2

## 2016-03-31 MED ORDER — ASPIRIN EC 81 MG PO TBEC
81.0000 mg | DELAYED_RELEASE_TABLET | Freq: Every day | ORAL | Status: DC
  Administered 2016-04-01 – 2016-04-02 (×2): 81 mg via ORAL
  Filled 2016-03-31 (×2): qty 1

## 2016-03-31 MED ORDER — IRBESARTAN 150 MG PO TABS
150.0000 mg | ORAL_TABLET | Freq: Every day | ORAL | Status: DC
  Administered 2016-04-01 – 2016-04-02 (×2): 150 mg via ORAL
  Filled 2016-03-31 (×2): qty 1

## 2016-03-31 MED ORDER — HYDROXYZINE HCL 25 MG PO TABS: 25.0000 mg | ORAL_TABLET | Freq: Three times a day (TID) | ORAL | Status: DC | PRN

## 2016-03-31 MED ORDER — METFORMIN HCL 500 MG PO TABS
1000.0000 mg | ORAL_TABLET | Freq: Two times a day (BID) | ORAL | Status: DC
  Administered 2016-03-31 – 2016-04-02 (×4): 1000 mg via ORAL
  Filled 2016-03-31 (×4): qty 2

## 2016-03-31 MED ORDER — ALUM & MAG HYDROXIDE-SIMETH 200-200-20 MG/5ML PO SUSP: 30.0000 mL | ORAL | Status: DC | PRN

## 2016-03-31 MED ORDER — ARIPIPRAZOLE 5 MG PO TABS
5.0000 mg | ORAL_TABLET | Freq: Two times a day (BID) | ORAL | Status: DC
  Filled 2016-03-31 (×2): qty 1

## 2016-03-31 MED ORDER — SIMVASTATIN 40 MG PO TABS
40.0000 mg | ORAL_TABLET | Freq: Every day | ORAL | Status: DC
  Administered 2016-03-31 – 2016-04-01 (×2): 40 mg via ORAL
  Filled 2016-03-31: qty 2
  Filled 2016-03-31: qty 1

## 2016-03-31 NOTE — Progress Notes (Signed)
Patient in all spheres except situation. She states, "someitmes we get sick, you know." when asked the reason for admission today. Patient denies SI/AH/VH and contracts for safety. She was oriented to the unit and ward rules and verbalized understanding/. Patient encouraged to talk to staff if any concerns and questions. W

## 2016-03-31 NOTE — ED Notes (Signed)
Patient noted in room. No complaints, stable, in no acute distress. Q15 minute rounds to continue. 

## 2016-03-31 NOTE — ED Notes (Signed)
Patient resting quietly in day room. No noted distress or abnormal behaviors noted. Will continue 15 minute checks and observation by security camera for safety.

## 2016-03-31 NOTE — ED Notes (Signed)
Pt will be transferred to BMU. Report called to [place] in BMU.  Belongings will be sent with pt. Pt denies SI/HI and AVH. Denies pain. Maintained on 15 minute checks and observation by security camera for safety.

## 2016-03-31 NOTE — ED Notes (Signed)
Patient resting quietly in room. No noted distress or abnormal behaviors noted. Will continue 15 minute checks and observation by security camera for safety. 

## 2016-03-31 NOTE — ED Notes (Signed)
Pt told RN she felt her blood sugar was dropping. RN given orders from ER MD to check CBG TID.  Pt given saltine crackers.

## 2016-03-31 NOTE — Tx Team (Signed)
Initial Interdisciplinary Treatment Plan   PATIENT STRESSORS: Family related stressors Disease Process     PATIENT STRENGTHS: Capable of independent living Supportive family/friends   PROBLEM LIST: Problem List/Patient Goals Date to be addressed Date deferred Reason deferred Estimated date of resolution  Hypertension      Diabetes                                                 DISCHARGE CRITERIA:  Improved stabilization in mood, thinking, and/or behavior Verbal commitment to aftercare and medication compliance  PRELIMINARY DISCHARGE PLAN: Outpatient therapy Return to previous living arrangement  PATIENT/FAMIILY INVOLVEMENT: This treatment plan has been presented to and reviewed with the patient, Sabrina [name].  The patient and family have been given the opportunity to ask questions and make suggestions.  Sabrina [name] 03/31/2016, 5:07 PM

## 2016-03-31 NOTE — ED Notes (Signed)
Pt in bed watching TV.  No concerns voiced. No distress noted. Pt will be transferred to BMU this afternoon. Pt accepting. Maintained on 15 minute checks and observation by security camera for safety.

## 2016-03-31 NOTE — Plan of Care (Signed)
Problem: Safety: Goal: Periods of time without injury will increase Outcome: Progressing Pt remains free from harm.  Problem: Education: Goal: Will be free of psychotic symptoms Outcome: Progressing Pt denies AVH at this time. She does not appear to be responding to internal stimuli. Pt displays paranoid behaviors.

## 2016-03-31 NOTE — ED Provider Notes (Signed)
Filed Vitals:   03/30/16 2040 03/31/16 0622  BP: 137/66 112/67  Pulse: 88 83  Temp: 98.2 F (36.8 C) 98.2 F (36.8 C)  Resp: 18 18   No acute events overnight.  Patient awaiting inpatient [facility] psychiatric admission.  Lisa Roca, MD 03/31/16 [phone]

## 2016-03-31 NOTE — ED Notes (Signed)
Pt pleasant and laughing this morning. Pt denies SI/HI and AVH. Pt stated she got up this morning because she could smell the coffee. RN informed pt there was no coffee served on this unit. Pt accepting. Pt made aware she will be moving to the unit downstairs when a bed is available. RN is not sure pt understood, but pt was agreeable.   No distress noted. Maintained on 15 minute checks and observation by security camera for safety.

## 2016-04-01 ENCOUNTER — Encounter: Payer: Self-pay | Admitting: Psychiatry

## 2016-04-01 DIAGNOSIS — E785 Hyperlipidemia, unspecified: Secondary | ICD-10-CM | POA: Diagnosis present

## 2016-04-01 DIAGNOSIS — E119 Type 2 diabetes mellitus without complications: Secondary | ICD-10-CM

## 2016-04-01 DIAGNOSIS — E11649 Type 2 diabetes mellitus with hypoglycemia without coma: Secondary | ICD-10-CM

## 2016-04-01 DIAGNOSIS — F312 Bipolar disorder, current episode manic severe with psychotic features: Secondary | ICD-10-CM

## 2016-04-01 DIAGNOSIS — E1169 Type 2 diabetes mellitus with other specified complication: Secondary | ICD-10-CM

## 2016-04-01 LAB — URINALYSIS COMPLETE WITH MICROSCOPIC (ARMC ONLY)
Bilirubin Urine: NEGATIVE
Glucose, UA: >500 mg/dL — AB
Hgb urine dipstick: NEGATIVE
Leukocytes, UA: NEGATIVE
Nitrite: NEGATIVE
PH: 5 (ref 5.0–8.0)
PROTEIN: 30 mg/dL — AB
Specific Gravity, Urine: 1.014 (ref 1.005–1.030)

## 2016-04-01 LAB — GLUCOSE, CAPILLARY
GLUCOSE-CAPILLARY: 133 mg/dL — AB (ref 65–99)
GLUCOSE-CAPILLARY: 122 mg/dL — AB (ref 65–99)
GLUCOSE-CAPILLARY: 115 mg/dL — AB (ref 65–99)
Glucose-Capillary: 145 mg/dL — ABNORMAL HIGH (ref 65–99)
Glucose-Capillary: 118 mg/dL — ABNORMAL HIGH (ref 65–99)

## 2016-04-01 LAB — URINE DRUG SCREEN, QUALITATIVE (ARMC ONLY)
AMPHETAMINES, UR SCREEN: NOT DETECTED
BARBITURATES, UR SCREEN: NOT DETECTED
Benzodiazepine, Ur Scrn: NOT DETECTED
CANNABINOID 50 NG, UR ~~LOC~~: NOT DETECTED
Cocaine Metabolite,Ur ~~LOC~~: NOT DETECTED
MDMA (Ecstasy)Ur Screen: NOT DETECTED
METHADONE SCREEN, URINE: NOT DETECTED
Opiate, Ur Screen: NOT DETECTED
PHENCYCLIDINE (PCP) UR S: NOT DETECTED
Tricyclic, Ur Screen: NOT DETECTED

## 2016-04-01 LAB — PROLACTIN: Prolactin: 9.6 ng/mL (ref 4.8–23.3)

## 2016-04-01 LAB — HEMOGLOBIN A1C: HEMOGLOBIN A1C: 10.2 % — AB (ref 4.0–6.0)

## 2016-04-01 MED ORDER — INSULIN ASPART 100 UNIT/ML ~~LOC~~ SOLN: 0.0000 [IU] | Freq: Three times a day (TID) | SUBCUTANEOUS | Status: DC

## 2016-04-01 MED ORDER — TEMAZEPAM 15 MG PO CAPS
15.0000 mg | ORAL_CAPSULE | Freq: Every day | ORAL | Status: DC
  Filled 2016-04-01: qty 1

## 2016-04-01 NOTE — Progress Notes (Signed)
Recreation Therapy Notes  Date: 06.07.17 Time: 9:30 am Location: Craft Room  Group Topic: Self-esteem  Goal Area(s) Addresses:  Patient will write at least one positive trait about self. Patient will verbalize benefit of having a healthy self-esteem.  Behavioral Response: Attentive, Interactive  Intervention: I Am  Activity: Patients were given a worksheet with the letter I on it and instructed to write as many positive traits inside the letter.  Education: LRT educated patients on ways they can increase their self-esteem.  Education Outcome: Acknowledges education/In group clarification offered  Clinical Observations/Feedback: Patient completed activity by writing positive traits. Patient contributed to group discussion by stating how she felt after listing positive traits, how her self-esteem affects her and how she can increase her self-esteem.  Leonette Monarch, LRT/CTRS 04/01/2016 10:27 AM

## 2016-04-01 NOTE — Progress Notes (Signed)
Inpatient Diabetes Program Recommendations  AACE/ADA: New Consensus Statement on Inpatient Glycemic Control (2015)  Target Ranges:  Prepandial:   less than 140 mg/dL      Peak postprandial:   less than 180 mg/dL (1-2 hours)      Critically ill patients:  140 - 180 mg/dL   Lab Results  Component Value Date   GLUCAP 145* 04/01/2016   HGBA1C 10.2* 03/31/2016    Review of Glycemic Control  Diabetes history: DM 2 Outpatient Diabetes medications: Levemir 20 units, Metformin 1,000 mg BID Current orders for Inpatient glycemic control: Levemir 20 units, Metformin 1,000 mg BID  Inpatient Diabetes Program Recommendations:  Glucose within goal. Noted A1c is 10.2 % which indicates an average glucose in the 240's. Patient with poor glucose control at home, unsure if patient is taking meds consistently at home since glucose looks great here. Patient will need to follow up with PCP at time of discharge.  Thanks,  Tama Headings RN, MSN, [facility] Inpatient Diabetes Coordinator Team Pager [phone] (8a-5p)

## 2016-04-01 NOTE — H&P (Signed)
Psychiatric Admission Assessment Adult  Patient Identification: Sabrina [name] MRN:  [id number] Date of Evaluation:  04/01/2016 Chief Complaint:  Bipolar Disorder Principal Diagnosis: Bipolar affective disorder, manic, severe, with psychotic behavior ([place]) Diagnosis:   Patient Active Problem List   Diagnosis Date Noted  . Diabetes ([place]) [E11.9] 04/01/2016  . Dyslipidemia [E78.5] 04/01/2016  . Bipolar affective disorder, manic, severe, with psychotic behavior ([place]) [F31.2] 03/28/2016  . Noncompliance [Z91.19] 03/28/2016  . Essential hypertension [I10] 03/09/2016   History of Present Illness:   Identifying data. Ms. Sabrina [name] is Sabrina 70 year old female with unknown psychiatric history.  Chief complaint. "I am fine. While before meals things like that about me."  History of present illness. Information was obtained from the patient and the chart. Sabrina [name] is Sabrina pleasant 70 year old female with apparently no past psychiatric history. Sabrina family noticed that since Sunday Sabrina behavior has changed. She became paranoid was afraid of people coming into the house and heard noises. She was sleepless and the family had to supervise Sabrina at night afraid that she might leave the house. This information was obtained during admission in the emergency room from Sabrina [name] with whom the patient lives. The patient herself was not able to provide much information in the emergency room that she was disorganized. Today Sabrina presentation is completely different she is pleasant polite and cooperated. She is able to provide much information most of it apparently true. She denies any symptoms of depression, anxiety, or psychosis. She thinks that she might have UTI. She reports that she has been under considerable stress lately. To Sabrina family members, Sabrina Sabrina [name] and Sabrina Sabrina [name] had Sabrina limbs amputated due to poor circulation. Sabrina [name] was diagnosed with breast cancer and is receiving treatment. Sabrina Sabrina [name] got in trouble at  school that make Sabrina very nervous thinking that he might end up in jail. The patient is not suicidal or homicidal. She refused Sabrina psychiatric medications claiming that she is fine. She accepted Sabrina other medicines for high blood pressure, diabetes, and dyslipidemia. In spite of Sabrina assurances she seems not to be compliant with Sabrina diabetes treatment as Sabrina hemoglobin A1c is 10.2. Sabrina explanation is that recently Sabrina doctors were changing Sabrina medicines but she feels that Sabrina medications are alright now. She denies any alcohol or illicit substance use.  Past psychiatric history. She denies any. She was never hospitalized or taken psychotropic medications. She never attempted suicide.  Family psychiatric history. She has Sabrina [name] who is 69 years old and reportedly had some mental illness issues in the past.  Social history. She lives with Sabrina [name]. She is retired. She has Medicare.  Total Time spent with patient: 1 hour  Past Psychiatric History: unknown.  Is the patient at risk to self? Yes.    Has the patient been Sabrina risk to self in the past 6 months? No.  Has the patient been Sabrina risk to self within the distant past? No.  Is the patient Sabrina risk to others? No.  Has the patient been Sabrina risk to others in the past 6 months? No.  Has the patient been Sabrina risk to others within the distant past? No.   Prior Inpatient Therapy:   Prior Outpatient Therapy:    Alcohol Screening: 1. How often do you have Sabrina drink containing alcohol?: Never 9. Have you or someone else been injured as Sabrina result of your drinking?: No 10. Has Sabrina relative or friend or Sabrina doctor or another Economist  been concerned about your drinking or suggested you cut down?: No Alcohol Use Disorder Identification Test Final Score (AUDIT): 0 Brief Intervention: AUDIT score less than 7 or less-screening does not suggest unhealthy drinking-brief intervention not indicated Substance Abuse History in the last 12 months:  No. Consequences  of Substance Abuse: NA Previous Psychotropic Medications: No  Psychological Evaluations: No  Past Medical History:  Past Medical History  Diagnosis Date  . Hypertension   . Diabetes mellitus without complication ([place])   . High cholesterol     Past Surgical History  Procedure Laterality Date  . Polypectomy     Family History:  Family History  Problem Relation Age of Onset  . Breast cancer Maternal [name]     2 aunts in their 45's   Family Psychiatric  History: unknown. Tobacco Screening: @FLOW ([phone])::1)@ Social History:  History  Alcohol Use No     History  Drug Use No    Additional Social History:                           Allergies:   Allergies  Allergen Reactions  . Glimepiride Diarrhea and Other (See Comments)  . Glipizide Other (See Comments)  . Lisinopril     Other reaction(s): Headache  . Losartan     Other reaction(s): Headache  . Pioglitazone Swelling    On legs   Lab Results:  Results for orders placed or performed during the hospital encounter of 03/31/16 (from the past 48 hour(s))  Glucose, capillary     Status: Abnormal   Collection Time: 03/31/16  4:24 PM  Result Value Ref Range   Glucose-Capillary 113 (H) 65 - 99 mg/dL  Hemoglobin A1c     Status: Abnormal   Collection Time: 03/31/16  5:21 PM  Result Value Ref Range   Hgb A1c MFr Bld 10.2 (H) 4.0 - 6.0 %  Lipid panel, fasting     Status: Abnormal   Collection Time: 03/31/16  5:21 PM  Result Value Ref Range   Cholesterol 211 (H) 0 - 200 mg/dL   Triglycerides 295 (H) <150 mg/dL   HDL 41 >40 mg/dL   Total CHOL/HDL Ratio 5.1 RATIO   VLDL 59 (H) 0 - 40 mg/dL   LDL Cholesterol 111 (H) 0 - 99 mg/dL    Comment:        Total Cholesterol/HDL:CHD Risk Coronary Heart Disease Risk Table                     Men   Women  1/2 Average Risk   3.4   3.3  Average Risk       5.0   4.4  2 X Average Risk   9.6   7.1  3 X Average Risk  23.4   11.0        Use the calculated Patient  Ratio above and the CHD Risk Table to determine the patient's CHD Risk.        ATP III CLASSIFICATION (LDL):  <100     mg/dL   Optimal  100-129  mg/dL   Near or Above                    Optimal  130-159  mg/dL   Borderline  160-189  mg/dL   High  >190     mg/dL   Very High   Prolactin     Status: None   Collection Time: 03/31/16  5:21 PM  Result Value Ref Range   Prolactin 9.6 4.8 - 23.3 ng/mL    Comment: (NOTE) Performed At: [facility] [place], [place] [id number] Lindon Romp MD [id number]   TSH     Status: None   Collection Time: 03/31/16  5:21 PM  Result Value Ref Range   TSH 0.753 0.350 - 4.500 uIU/mL  Glucose, capillary     Status: Abnormal   Collection Time: 03/31/16  8:24 PM  Result Value Ref Range   Glucose-Capillary 133 (H) 65 - 99 mg/dL  Glucose, capillary     Status: Abnormal   Collection Time: 04/01/16  6:30 AM  Result Value Ref Range   Glucose-Capillary 145 (H) 65 - 99 mg/dL   Comment 1 Notify RN    Comment 2 Document in Chart     Blood Alcohol level:  Lab Results  Component Value Date   ETH <5 [id number]    Metabolic Disorder Labs:  Lab Results  Component Value Date   HGBA1C 10.2* 03/31/2016   Lab Results  Component Value Date   PROLACTIN 9.6 03/31/2016   Lab Results  Component Value Date   CHOL 211* 03/31/2016   TRIG 295* 03/31/2016   HDL 41 03/31/2016   CHOLHDL 5.1 03/31/2016   VLDL 59* 03/31/2016   LDLCALC 111* 03/31/2016   LDLCALC 85 03/09/2016    Current Medications: Current Facility-Administered Medications  Medication Dose Route Frequency Provider Last Rate Last Dose  . acetaminophen (TYLENOL) tablet 650 mg  650 mg Oral Q6H PRN Gonzella Lex, MD      . alum & mag hydroxide-simeth (MAALOX/MYLANTA) 200-200-20 MG/5ML suspension 30 mL  30 mL Oral Q4H PRN Gonzella Lex, MD      . ARIPiprazole (ABILIFY) tablet 5 mg  5 mg Oral BID AC Gonzella Lex, MD   5 mg at 04/01/16 0819  . aspirin EC tablet 81  mg  81 mg Oral Daily Gonzella Lex, MD   81 mg at 04/01/16 0819  . divalproex (DEPAKOTE) DR tablet 250 mg  250 mg Oral Q12H Gonzella Lex, MD   250 mg at 04/01/16 [id number]  . hydrOXYzine (ATARAX/VISTARIL) tablet 25 mg  25 mg Oral TID PRN Hildred Priest, MD      . insulin detemir (LEVEMIR) injection 20 Units  20 Units Subcutaneous QHS Gonzella Lex, MD   20 Units at 03/31/16 2152  . irbesartan (AVAPRO) tablet 150 mg  150 mg Oral Daily Gonzella Lex, MD   150 mg at 04/01/16 0819  . magnesium hydroxide (MILK OF MAGNESIA) suspension 30 mL  30 mL Oral Daily PRN Gonzella Lex, MD      . metFORMIN (GLUCOPHAGE) tablet 1,000 mg  1,000 mg Oral BID WC Gonzella Lex, MD   1,000 mg at 04/01/16 0819  . metoprolol succinate (TOPROL-XL) 24 hr tablet 50 mg  50 mg Oral Daily Gonzella Lex, MD   50 mg at 04/01/16 0819  . simvastatin (ZOCOR) tablet 40 mg  40 mg Oral QHS Gonzella Lex, MD   40 mg at 03/31/16 2146  . spironolactone (ALDACTONE) tablet 25 mg  25 mg Oral Daily Gonzella Lex, MD   25 mg at 04/01/16 0819  . traZODone (DESYREL) tablet 50 mg  50 mg Oral QHS PRN Hildred Priest, MD       PTA Medications: Prescriptions prior to admission  Medication Sig Dispense Refill Last Dose  . aspirin EC 81 MG  tablet Take 1 tablet by mouth daily.   unknown  . irbesartan (AVAPRO) 150 MG tablet Take 1 tablet by mouth daily.  1 unknown  . LEVEMIR FLEXTOUCH 100 UNIT/ML Pen Inject 20 Units into the skin at bedtime.  0 unknown  . metFORMIN (GLUCOPHAGE) 500 MG tablet Take 1,000 mg by mouth 2 (two) times daily.  10 unknown  . metoprolol succinate (TOPROL-XL) 50 MG 24 hr tablet Take 50 mg by mouth daily.  0 unknown  . simvastatin (ZOCOR) 40 MG tablet Take 1 tablet by mouth at bedtime.  4 unknown  . spironolactone (ALDACTONE) 25 MG tablet Take 25 mg by mouth daily.  0 unknown    Musculoskeletal: Strength & Muscle Tone: within normal limits Gait & Station: normal Patient leans: N/Sabrina  Psychiatric  Specialty Exam: Physical Exam  Nursing note and vitals reviewed. Constitutional: She appears well-developed and well-nourished.  HENT:  Head: Normocephalic and atraumatic.  Eyes: Conjunctivae and EOM are normal. Pupils are equal, round, and reactive to light.  Neck: Normal range of motion. Neck supple.  Cardiovascular: Normal rate, regular rhythm and normal heart sounds.   Respiratory: Effort normal and breath sounds normal.  GI: Soft. Bowel sounds are normal.  Musculoskeletal: Normal range of motion.  Neurological: She is alert.  Skin: Skin is warm and dry.    Review of Systems  Psychiatric/Behavioral: Positive for hallucinations.  All other systems reviewed and are negative.   Blood pressure 146/71, pulse 105, temperature 98.9 F (37.2 C), temperature source Oral, resp. rate 18, SpO2 100 %.There is no weight on file to calculate BMI.  See SRA.                                                  Sleep:  Number of Hours: 3.75       Treatment Plan Summary: Daily contact with patient to assess and evaluate symptoms and progress in treatment and Medication management   Ms. Hrabik is Sabrina 71 year old female with unknown psychiatric history admitted for Sabrina psychotic break most likely in the manic episode.  1. Mood and psychosis. She was started on Depakote and Abilify.  2. Anxiety. Hydroxyzine is available.  3. Insomnia. She slept less than 4 hours with trazodone. We will offer Restoril.   4. Hypertension. She is on Avapro, metoprolol, and Aldactone.  5. Diabetes. She is on Levemir at night and metformin.  6. Dyslipidemia. She is on Zocor and aspirin.  7. Metabolic syndromes screening. Lipid profile is elevated, hemoglobin A1c 10.2. TSH and prolactin are normal.    Observation Level/Precautions:  15 minute checks  Laboratory:  CBC Chemistry Profile UDS UA  Psychotherapy:    Medications:    Consultations:    Discharge Concerns:    Estimated LOS:   Other:     I certify that inpatient services furnished can reasonably be expected to improve the patient's condition.    Orson Slick, MD 6/7/201710:18 AM

## 2016-04-01 NOTE — Plan of Care (Signed)
Problem: Education: Goal: Mental status will improve Outcome: Progressing Logical/Coherent denies AVH

## 2016-04-01 NOTE — Progress Notes (Signed)
Pt presents pleasant and cooperative with care. No confusion noted. Pt denies SI, HI, AVH. When nurse asked pt if she were hearing voices, she said yes "yours". Pt logical and coherent in conversation. Interacts appropriately with staff and peers. No negative behaviors. Med and group compliant.  Encouragement and support provided, pt receptive and remains safe on unit with q 15 min checks.

## 2016-04-01 NOTE — BHH Suicide Risk Assessment (Addendum)
[facility] Admission Suicide Risk Assessment   Nursing information obtained from:    Demographic factors:    Current Mental Status:    Loss Factors:    Historical Factors:    Risk Reduction Factors:     Total Time spent with patient: 1 hour Principal Problem: Bipolar affective disorder, manic, severe, with psychotic behavior ([place]) Diagnosis:   Patient Active Problem List   Diagnosis Date Noted  . Diabetes ([place]) [E11.9] 04/01/2016  . Dyslipidemia [E78.5] 04/01/2016  . Bipolar affective disorder, manic, severe, with psychotic behavior ([place]) [F31.2] 03/28/2016  . Noncompliance [Z91.19] 03/28/2016  . Essential hypertension [I10] 03/09/2016   Subjective Data: psychotic break.  Continued Clinical Symptoms:  Alcohol Use Disorder Identification Test Final Score (AUDIT): 0 The "Alcohol Use Disorders Identification Test", Guidelines for Use in Primary Care, Second Edition.  World Pharmacologist [facility]). Score between 0-7:  no or low risk or alcohol related problems. Score between 8-15:  moderate risk of alcohol related problems. Score between 16-19:  high risk of alcohol related problems. Score 20 or above:  warrants further diagnostic evaluation for alcohol dependence and treatment.   CLINICAL FACTORS:   Bipolar Disorder:   Mixed State   Musculoskeletal: Strength & Muscle Tone: within normal limits Gait & Station: normal Patient leans: N/A  Psychiatric Specialty Exam: Physical Exam  Nursing note and vitals reviewed.   Review of Systems  Psychiatric/Behavioral: Positive for hallucinations.  All other systems reviewed and are negative.   Blood pressure 146/71, pulse 105, temperature 98.9 F (37.2 C), temperature source Oral, resp. rate 18, SpO2 100 %.There is no weight on file to calculate BMI.  General Appearance: Fairly Groomed  Eye Contact:  Fair  Speech:  Normal Rate  Volume:  Normal  Mood:  Euthymic  Affect:  Appropriate and Labile  Thought Process:  Coherent, Goal  Directed and Linear  Orientation:  Full (Time, Place, and Person)  Thought Content:  Delusions, Hallucinations: Auditory Visual and Paranoid Ideation  Suicidal Thoughts:  No  Homicidal Thoughts:  No  Memory:  Immediate;   Fair Recent;   Fair Remote;   Fair  Judgement:  Impaired  Insight:  Fair  Psychomotor Activity:  Normal  Concentration:  Concentration: Fair and Attention Span: Fair  Recall:  [employer] of Knowledge:  Fair  Language:  Fair  Akathisia:  No  Handed:  Right  AIMS (if indicated):     Assets:  Communication Skills Desire for Improvement Financial Resources/Insurance Housing Physical Health Resilience Social Support  ADL's:  Intact  Cognition:  WNL  Sleep:  Number of Hours: 3.75      COGNITIVE FEATURES THAT CONTRIBUTE TO RISK:  None    SUICIDE RISK:   Mild:  Suicidal ideation of limited frequency, intensity, duration, and specificity.  There are no identifiable plans, no associated intent, mild dysphoria and related symptoms, good self-control (both objective and subjective assessment), few other risk factors, and identifiable protective factors, including available and accessible social support.  PLAN OF CARE: Hospital admission, medication management, discharge planning.  Ms. Stuckwisch is a 70 year old female with unknown psychiatric history admitted for a psychotic break most likely in the manic episode.  1. Mood and psychosis. She was started on Depakote and Abilify in the ER but the patient refuses.  2. Anxiety. Hydroxyzine is available.  3. Insomnia. She slept less than 4 hours with trazodone. We will offer Restoril.   4. Hypertension. She is on Avapro, metoprolol, and Aldactone.  5. Diabetes. She is on  Levemir at night and metformin.  6. Dyslipidemia. She is on Zocor and aspirin.  7. Metabolic syndromes screening. Lipid profile is elevated, hemoglobin A1c 10.2. TSH and prolactin are normal.   8. Rule out UTI. Urinalysis or UDS were not  performed in the ER. Results are pending.  9. Disposition. The patient will be discharged with her family. She will follow up with [facility] if necessary.  I certify that inpatient services furnished can reasonably be expected to improve the patient's condition.   Orson Slick, MD 04/01/2016, 10:12 AM

## 2016-04-01 NOTE — Progress Notes (Signed)
D: Pt isolates in her room most of this evening. She brightens upon approach. Pt states "I don't need to be here, there's nothing wrong with me." Denies SI/HI/AVH at this time. Denies pain. Pt refuses evening dose of Depakote. When writer enters pt room later in the evening, pt has laundry hamper pushed against door. Writer asked pt why she put the hamper there and she responded "Because someone was peeping in on me!" A: Emotional support and encouragement provided. Pt educated on 15 minute rounds that are done by staff and ways to ensure patient safety. q15 minute safety checks maintained. R: Pt verbalizes understanding of teaching. Pt remains free from harm. Will continue to monitor.

## 2016-04-02 DIAGNOSIS — F312 Bipolar disorder, current episode manic severe with psychotic features: Secondary | ICD-10-CM | POA: Insufficient documentation

## 2016-04-02 LAB — GLUCOSE, CAPILLARY
GLUCOSE-CAPILLARY: 101 mg/dL — AB (ref 65–99)
GLUCOSE-CAPILLARY: 118 mg/dL — AB (ref 65–99)
Glucose-Capillary: 109 mg/dL — ABNORMAL HIGH (ref 65–99)

## 2016-04-02 NOTE — Progress Notes (Signed)
D/C instructions/transition record/suicide risk assessment/meds/follow-up appointments reviewed and given to patient, pt verbalized understanding, pt's belongings returned to pt, denies SI/HI/AVH.

## 2016-04-02 NOTE — BHH Suicide Risk Assessment (Signed)
[place] INPATIENT:  Family/Significant Other Suicide Prevention Education  Suicide Prevention Education:  Education Completed; [name] (daughter) has been identified by the patient as the family member/significant other with whom the patient will be residing, and identified as the person(s) who will aid the patient in the event of a mental health crisis (suicidal ideations/suicide attempt).  With written consent from the patient, the family member/significant other has been provided the following suicide prevention education, prior to the and/or following the discharge of the patient.  The suicide prevention education provided includes the following:  Suicide risk factors  Suicide prevention and interventions  National Suicide Hotline telephone number  [facility] [place] assessment telephone number  [facility] Emergency Assistance [place] and/or Residential Mobile Crisis Unit telephone number  Request made of family/significant other to:  Remove weapons (e.g., guns, rifles, knives), all items previously/currently identified as safety concern.    Remove drugs/medications (over-the-counter, prescriptions, illicit drugs), all items previously/currently identified as a safety concern.  The family member/significant other verbalizes understanding of the suicide prevention education information provided.  The family member/significant other agrees to remove the items of safety concern listed above.  [place] MSW, [place]  04/02/2016, 2:56 PM

## 2016-04-02 NOTE — Plan of Care (Signed)
Problem: Education: Goal: Knowledge of the prescribed therapeutic regimen will improve Outcome: Progressing Pt asks questions appropriately regarding prescribed medications. She requests a list of the names of the medications she is taking.  Problem: Safety: Goal: Ability to disclose and discuss suicidal ideas will improve Outcome: Progressing Pt denies SI at this time. She rates depression 0/10.

## 2016-04-02 NOTE — [facility] Counselor (Signed)
Adult Comprehensive Assessment  Patient ID: Sabrina [name], female   DOB: 16-Feb-1946, 70 y.o.   MRN: [id number]  Information Source: Information source: Patient  Current Stressors:  Educational / Learning stressors: None reported  Employment / Job issues: Pt is retired  Family Relationships: None reported  Museum/gallery curator / Lack of resources (include bankruptcy): Limited income  Housing / Lack of housing: Pt lives with daughter  Physical health (include injuries & life threatening diseases): None reported  Social relationships: None reported  Substance abuse: Denies use.  Bereavement / Loss: None reported   Living/Environment/Situation:  Living Arrangements: Children Living conditions (as described by patient or guardian): good  How long has patient lived in current situation?: 13 years  What is atmosphere in current home: Comfortable, Supportive  Family History:  Marital status: Divorced Divorced, when?: 1995 What types of issues is patient dealing with in the relationship?: "it was a nightmare"  Additional relationship information: married for 30 years  Are you sexually active?: No What is your sexual orientation?: heterosexual  Has your sexual activity been affected by drugs, alcohol, medication, or emotional stress?: None reported  Does patient have children?: Yes How many children?: 3 How is patient's relationship with their children?: 2 daughters, 1 son; good relationship.   Childhood History:  By whom was/is the patient raised?: Mother, Grandparents Description of patient's relationship with caregiver when they were a child: Good relationship with mother and grandfather  Patient's description of current relationship with people who raised him/her: Both have passed away.  How were you disciplined when you got in trouble as a child/adolescent?: Physical and verbal.  Does patient have siblings?: Yes Number of Siblings: 11 Description of patient's current relationship with  siblings: Good relationship with brothers and sisters.  Did patient suffer any verbal/emotional/physical/sexual abuse as a child?: No Did patient suffer from severe childhood neglect?: No Has patient ever been sexually abused/assaulted/raped as an adolescent or adult?: No Was the patient ever a victim of a crime or a disaster?: No Witnessed domestic violence?: No Has patient been effected by domestic violence as an adult?: No  Education:  Highest grade of school patient has completed: Some Secretary/administrator Currently a student?: No Learning disability?: No  Employment/Work Situation:   Employment situation: On disability Why is patient on disability: SSI  How long has patient been on disability: 6 years  What is the longest time patient has a held a job?: 30 years  Where was the patient employed at that time?: factory  Has patient ever been in the [employer]?: No  Financial Resources:   Museum/gallery curator resources: Receives SSI Does patient have a Programmer, applications or guardian?: No  Alcohol/Substance Abuse:   What has been your use of drugs/alcohol within the last 12 months?: Denies use.  If attempted suicide, did drugs/alcohol play a role in this?: No Alcohol/Substance Abuse Treatment Hx: Denies past history Has alcohol/substance abuse ever caused legal problems?: No  Social Support System:   Patient's Community Support System: Good Describe Community Support System: family Type of faith/religion: Christianity  How does patient's faith help to cope with current illness?: reading the bible,   Leisure/Recreation:   Leisure and Hobbies: writing stories   Strengths/Needs:   What things does the patient do well?: cooking, helping people In what areas does patient struggle / problems for patient: Pt was unsure.   Discharge Plan:   Does patient have access to transportation?: Yes Will patient be returning to same living situation after discharge?: Yes Currently receiving community  mental  health services: No If no, would patient like referral for services when discharged?: No Does patient have financial barriers related to discharge medications?: No  Summary/Recommendations:    Patient is a 70 year old female admitted with a diagnosis of Bipolar disorder. Patient presented to the hospital with mania. Patient reports primary triggers for admission were reaction to medications. Patient will benefit from crisis stabilization, medication evaluation, group therapy and psycho education in addition to case management for discharge. At discharge, it is recommended that patient remain compliant with established discharge plan and continued treatment.   [place]. MSW, [facility]  04/02/2016

## 2016-04-02 NOTE — Progress Notes (Signed)
Patient ID: Sabrina [name], female   DOB: 1946-03-06, 70 y.o.   MRN: [id number]  Pt refused psychiatric follow up. "I don't need it. There is nothing wrong with me."  CSW made hospital follow up appointment with her primary care physician.  Wray Kearns, MSW, LCSWA 04/02/2016 2:58 PM

## 2016-04-02 NOTE — Progress Notes (Signed)
[facility] MD Progress Note  04/02/2016 11:54 AM Sabrina [name]  MRN:  [id number]  Subjective: Sabrina [name] participated in treatment team this am. She is pleasant, polite, cooperative, engaging and appropriately funny. She denies any symptoms of depression, anxiety or psychosis. She is not suicidal or homicidal. She is somewhat paranoid believing that her daughter was "talk into" placing her in the hospital by some people. We ruled out UTI as a source of confusion reported prior to admission. She is no longer confused. There are no somatic complaints.  Principal Problem: Bipolar affective disorder, manic, severe, with psychotic behavior ([place]) Diagnosis:   Patient Active Problem List   Diagnosis Date Noted  . Diabetes ([place]) [E11.9] 04/01/2016  . Dyslipidemia [E78.5] 04/01/2016  . Bipolar affective disorder, manic, severe, with psychotic behavior ([place]) [F31.2] 03/28/2016  . Noncompliance [Z91.19] 03/28/2016  . Essential hypertension [I10] 03/09/2016   Total Time spent with patient: 20 minutes  Past Psychiatric History: none.  Past Medical History:  Past Medical History  Diagnosis Date  . Hypertension   . Diabetes mellitus without complication ([place])   . High cholesterol     Past Surgical History  Procedure Laterality Date  . Polypectomy     Family History:  Family History  Problem Relation Age of Onset  . Breast cancer Maternal Aunt     2 aunts in their 82's   Family Psychiatric  History: none. Social History:  History  Alcohol Use No     History  Drug Use No    Social History   Social History  . Marital Status: Divorced    Spouse Name: N/A  . Number of Children: N/A  . Years of Education: N/A   Occupational History  . retired    Social History Main Topics  . Smoking status: Never Smoker   . Smokeless tobacco: None  . Alcohol Use: No  . Drug Use: No  . Sexual Activity: Not Asked   Other Topics Concern  . None   Social History Narrative   Additional Social  History:                         Sleep: Fair  Appetite:  Fair  Current Medications: Current Facility-Administered Medications  Medication Dose Route Frequency Provider Last Rate Last Dose  . acetaminophen (TYLENOL) tablet 650 mg  650 mg Oral Q6H PRN Gonzella Lex, MD      . alum & mag hydroxide-simeth (MAALOX/MYLANTA) 200-200-20 MG/5ML suspension 30 mL  30 mL Oral Q4H PRN Gonzella Lex, MD      . ARIPiprazole (ABILIFY) tablet 5 mg  5 mg Oral BID AC Gonzella Lex, MD   5 mg at 03/31/16 1730  . aspirin EC tablet 81 mg  81 mg Oral Daily Gonzella Lex, MD   81 mg at 04/02/16 0857  . divalproex (DEPAKOTE) DR tablet 250 mg  250 mg Oral Q12H Gonzella Lex, MD   250 mg at 03/31/16 2148  . hydrOXYzine (ATARAX/VISTARIL) tablet 25 mg  25 mg Oral TID PRN Hildred Priest, MD      . insulin aspart (novoLOG) injection 0-15 Units  0-15 Units Subcutaneous TID WC Preslee Regas B Jaymi Tinner, MD   0 Units at 04/01/16 1150  . insulin detemir (LEVEMIR) injection 20 Units  20 Units Subcutaneous QHS Gonzella Lex, MD   20 Units at 04/01/16 2136  . irbesartan (AVAPRO) tablet 150 mg  150 mg Oral Daily John T Clapacs,  MD   150 mg at 04/02/16 0857  . magnesium hydroxide (MILK OF MAGNESIA) suspension 30 mL  30 mL Oral Daily PRN Gonzella Lex, MD      . metFORMIN (GLUCOPHAGE) tablet 1,000 mg  1,000 mg Oral BID WC Gonzella Lex, MD   1,000 mg at 04/02/16 0857  . metoprolol succinate (TOPROL-XL) 24 hr tablet 50 mg  50 mg Oral Daily Gonzella Lex, MD   50 mg at 04/02/16 0857  . simvastatin (ZOCOR) tablet 40 mg  40 mg Oral QHS Gonzella Lex, MD   40 mg at 04/01/16 2137  . spironolactone (ALDACTONE) tablet 25 mg  25 mg Oral Daily Gonzella Lex, MD   25 mg at 04/02/16 0857  . temazepam (RESTORIL) capsule 15 mg  15 mg Oral QHS Clovis Fredrickson, MD   15 mg at 04/01/16 2140    Lab Results:  Results for orders placed or performed during the hospital encounter of 03/31/16 (from the past 48 hour(s))   Glucose, capillary     Status: Abnormal   Collection Time: 03/31/16  4:24 PM  Result Value Ref Range   Glucose-Capillary 113 (H) 65 - 99 mg/dL  Hemoglobin A1c     Status: Abnormal   Collection Time: 03/31/16  5:21 PM  Result Value Ref Range   Hgb A1c MFr Bld 10.2 (H) 4.0 - 6.0 %  Lipid panel, fasting     Status: Abnormal   Collection Time: 03/31/16  5:21 PM  Result Value Ref Range   Cholesterol 211 (H) 0 - 200 mg/dL   Triglycerides 295 (H) <150 mg/dL   HDL 41 >40 mg/dL   Total CHOL/HDL Ratio 5.1 RATIO   VLDL 59 (H) 0 - 40 mg/dL   LDL Cholesterol 111 (H) 0 - 99 mg/dL    Comment:        Total Cholesterol/HDL:CHD Risk Coronary Heart Disease Risk Table                     Men   Women  1/2 Average Risk   3.4   3.3  Average Risk       5.0   4.4  2 X Average Risk   9.6   7.1  3 X Average Risk  23.4   11.0        Use the calculated Patient Ratio above and the CHD Risk Table to determine the patient's CHD Risk.        ATP III CLASSIFICATION (LDL):  <100     mg/dL   Optimal  100-129  mg/dL   Near or Above                    Optimal  130-159  mg/dL   Borderline  160-189  mg/dL   High  >190     mg/dL   Very High   Prolactin     Status: None   Collection Time: 03/31/16  5:21 PM  Result Value Ref Range   Prolactin 9.6 4.8 - 23.3 ng/mL    Comment: (NOTE) Performed At: [facility] [place], [place] [id number] Lindon Romp MD [id number]   TSH     Status: None   Collection Time: 03/31/16  5:21 PM  Result Value Ref Range   TSH 0.753 0.350 - 4.500 uIU/mL  Glucose, capillary     Status: Abnormal   Collection Time: 03/31/16  8:24 PM  Result Value Ref Range  Glucose-Capillary 133 (H) 65 - 99 mg/dL  Glucose, capillary     Status: Abnormal   Collection Time: 04/01/16  6:30 AM  Result Value Ref Range   Glucose-Capillary 145 (H) 65 - 99 mg/dL   Comment 1 Notify RN    Comment 2 Document in Chart   Urine Drug Screen, Qualitative     Status: None    Collection Time: 04/01/16 11:00 AM  Result Value Ref Range   Tricyclic, Ur Screen NONE DETECTED NONE DETECTED   Amphetamines, Ur Screen NONE DETECTED NONE DETECTED   MDMA (Ecstasy)Ur Screen NONE DETECTED NONE DETECTED   Cocaine Metabolite,Ur [place] NONE DETECTED NONE DETECTED   Opiate, Ur Screen NONE DETECTED NONE DETECTED   Phencyclidine (PCP) Ur S NONE DETECTED NONE DETECTED   Cannabinoid 50 Ng, Ur  NONE DETECTED NONE DETECTED   Barbiturates, Ur Screen NONE DETECTED NONE DETECTED   Benzodiazepine, Ur Scrn NONE DETECTED NONE DETECTED   Methadone Scn, Ur NONE DETECTED NONE DETECTED    Comment: (NOTE) [id number]  Tricyclics, urine               Cutoff 1000 ng/mL 200  Amphetamines, urine             Cutoff 1000 ng/mL 300  MDMA (Ecstasy), urine           Cutoff 500 ng/mL 400  Cocaine Metabolite, urine       Cutoff 300 ng/mL 500  Opiate, urine                   Cutoff 300 ng/mL 600  Phencyclidine (PCP), urine      Cutoff 25 ng/mL 700  Cannabinoid, urine              Cutoff 50 ng/mL 800  Barbiturates, urine             Cutoff 200 ng/mL 900  Benzodiazepine, urine           Cutoff 200 ng/mL 1000 Methadone, urine                Cutoff 300 ng/mL 1100 1200 The urine drug screen provides only a preliminary, unconfirmed 1300 analytical test result and should not be used for non-medical 1400 purposes. Clinical consideration and professional judgment should 1500 be applied to any positive drug screen result due to possible 1600 interfering substances. A more specific alternate chemical method 1700 must be used in order to obtain a confirmed analytical result.  1800 Gas chromato graphy / mass spectrometry (GC/MS) is the preferred 1900 confirmatory method.   Urinalysis complete, with microscopic ([facility] only)     Status: Abnormal   Collection Time: 04/01/16 11:00 AM  Result Value Ref Range   Color, Urine YELLOW (A) YELLOW   APPearance HAZY (A) CLEAR   Glucose, UA >500 (A) NEGATIVE mg/dL   Bilirubin  Urine NEGATIVE NEGATIVE   Ketones, ur TRACE (A) NEGATIVE mg/dL   Specific Gravity, Urine 1.014 1.005 - 1.030   Hgb urine dipstick NEGATIVE NEGATIVE   pH 5.0 5.0 - 8.0   Protein, ur 30 (A) NEGATIVE mg/dL   Nitrite NEGATIVE NEGATIVE   Leukocytes, UA NEGATIVE NEGATIVE   RBC / HPF 0-5 0 - 5 RBC/hpf   WBC, UA 0-5 0 - 5 WBC/hpf   Bacteria, UA RARE (A) NONE SEEN   Squamous Epithelial / LPF 0-5 (A) NONE SEEN   Mucous PRESENT    Hyaline Casts, UA PRESENT   Glucose, capillary  Status: Abnormal   Collection Time: 04/01/16 11:49 AM  Result Value Ref Range   Glucose-Capillary 122 (H) 65 - 99 mg/dL   Comment 1 Notify RN   Glucose, capillary     Status: Abnormal   Collection Time: 04/01/16  4:42 PM  Result Value Ref Range   Glucose-Capillary 115 (H) 65 - 99 mg/dL   Comment 1 Notify RN   Glucose, capillary     Status: Abnormal   Collection Time: 04/01/16  9:06 PM  Result Value Ref Range   Glucose-Capillary 118 (H) 65 - 99 mg/dL  Glucose, capillary     Status: Abnormal   Collection Time: 04/02/16  6:46 AM  Result Value Ref Range   Glucose-Capillary 101 (H) 65 - 99 mg/dL   Comment 1 Notify RN    Comment 2 Document in Chart   Glucose, capillary     Status: Abnormal   Collection Time: 04/02/16 11:45 AM  Result Value Ref Range   Glucose-Capillary 118 (H) 65 - 99 mg/dL    Blood Alcohol level:  Lab Results  Component Value Date   ETH <5 03/28/2016    Physical Findings: AIMS:  , ,  ,  ,    CIWA:    COWS:     Musculoskeletal: Strength & Muscle Tone: within normal limits Gait & Station: normal Patient leans: N/A  Psychiatric Specialty Exam: Physical Exam  Nursing note and vitals reviewed.   Review of Systems  All other systems reviewed and are negative.   Blood pressure 127/66, pulse 92, temperature 98.8 F (37.1 C), temperature source Oral, resp. rate 18, weight 69.4 kg (153 lb), SpO2 100 %.Body mass index is 28.92 kg/(m^2).  General Appearance: Casual  Eye Contact:  Good   Speech:  Clear and Coherent  Volume:  Normal  Mood:  Euthymic  Affect:  Appropriate  Thought Process:  Goal Directed  Orientation:  Full (Time, Place, and Person)  Thought Content:  WDL  Suicidal Thoughts:  No  Homicidal Thoughts:  No  Memory:  Immediate;   Fair Recent;   Fair Remote;   Fair  Judgement:  Fair  Insight:  Fair  Psychomotor Activity:  Normal  Concentration:  Concentration: Fair and Attention Span: Fair  Recall:  [employer] of Knowledge:  Fair  Language:  Fair  Akathisia:  No  Handed:  Right  AIMS (if indicated):     Assets:  Communication Skills Desire for Improvement Financial Resources/Insurance Housing Physical Health Resilience Social Support Transportation  ADL's:  Intact  Cognition:  WNL  Sleep:  Number of Hours: 5     Treatment Plan Summary: Daily contact with patient to assess and evaluate symptoms and progress in treatment and Medication management   Sabrina [name] is a 70 year old female with unknown psychiatric history admitted for a psychotic break most likely in the manic episode.  1. Mood and psychosis. She was started on Depakote and Abilify for presumed bipolar disorder but she refused medications.  2. Anxiety. Hydroxyzine is available.  3. Insomnia. She refused sleeping aid and slept 5 hours last night.    4. Hypertension. She is on Avapro, metoprolol, and Aldactone.  5. Diabetes. She is on Levemir at night and metformin with ADA diet and SSI.   6. Dyslipidemia. She is on Zocor and aspirin.  7. Metabolic syndromes screening. Lipid profile is elevated, hemoglobin A1c 10.2. TSH and prolactin are normal.   8. Social. Family meeting may be necessary prior to discharge.   9. Rule  out UTI. Urinalysis is not suggestive of UTI. UDS is negative.   10. Disposition. She will be discharged to home with family. She refuses to follow up with mental health provider. She will continue with her PCP.  Orson Slick, MD 04/02/2016, 11:54  AM

## 2016-04-02 NOTE — Discharge Summary (Signed)
Physician Discharge Summary Note  Patient:  Sabrina [name] is an 70 y.o., female MRN:  [id number] DOB:  05/21/1946 Patient phone:  [phone] (home)  Patient address:   [place] [id number],  Total Time spent with patient: 30 minutes  Date of Admission:  03/31/2016 Date of Discharge: 04/02/2016  Reason for Admission:  Psychotic break.  Principal Problem: Brief psychotic disorder Discharge Diagnoses: Patient Active Problem List   Diagnosis Date Noted  . Diabetes ([place]) [E11.9] 04/01/2016  . Dyslipidemia [E78.5] 04/01/2016  . Brief psychotic disorder [F23] 03/28/2016  . Noncompliance [Z91.19] 03/28/2016  . Essential hypertension [I10] 03/09/2016   Past Medical History:  Past Medical History  Diagnosis Date  . Hypertension   . Diabetes mellitus without complication ([place])   . High cholesterol     Past Surgical History  Procedure Laterality Date  . Polypectomy     Family History:  Family History  Problem Relation Age of Onset  . Breast cancer Maternal Aunt     2 aunts in their 61's   Social History:  History  Alcohol Use No     History  Drug Use No    Social History   Social History  . Marital Status: Divorced    Spouse Name: N/A  . Number of Children: N/A  . Years of Education: N/A   Occupational History  . retired    Social History Main Topics  . Smoking status: Never Smoker   . Smokeless tobacco: None  . Alcohol Use: No  . Drug Use: No  . Sexual Activity: Not Asked   Other Topics Concern  . None   Social History Narrative    Hospital Course:    Ms. Pressman is a 70 year old female with unknown psychiatric history admitted for a brief psychotic break.   1. Mood and psychosis. We offered Depakote and Abilify for presumed bipolar disorder but she refused medications. All psychotic symptoms resolved.  2. Anxiety. Hydroxyzine was available.  3. Insomnia. She refused sleeping aid and slept 5 hours last night.   4.  Hypertension. She was on Avapro, metoprolol, and Aldactone.  5. Diabetes. She was on Levemir at night and metformin with ADA diet and SSI.   6. Dyslipidemia. She was on Zocor and aspirin.  7. Metabolic syndromes screening. Lipid profile is elevated, hemoglobin A1c 10.2. TSH and prolactin are normal.   8. Rule out UTI. Urinalysis was not suggestive of UTI. UDS is negative.   9. Disposition. She was discharged to home with family. She refuses to follow up with mental health provider. She will continue with Dr. Doy Hutching, her PCP.  Physical Findings: AIMS:  , ,  ,  ,    CIWA:    COWS:     Musculoskeletal: Strength & Muscle Tone: within normal limits Gait & Station: normal Patient leans: N/A  Psychiatric Specialty Exam: Physical Exam  Nursing note and vitals reviewed.   Review of Systems  All other systems reviewed and are negative.   Blood pressure 127/66, pulse 92, temperature 98.8 F (37.1 C), temperature source Oral, resp. rate 18, weight 69.4 kg (153 lb), SpO2 100 %.Body mass index is 28.92 kg/(m^2).  See SRA.                                                  Sleep:  Number of  Hours: 5     Have you used any form of tobacco in the last 30 days? (Cigarettes, Smokeless Tobacco, Cigars, and/or Pipes): No (n/a)  Has this patient used any form of tobacco in the last 30 days? (Cigarettes, Smokeless Tobacco, Cigars, and/or Pipes) Yes, Yes, A prescription for an FDA-approved tobacco cessation medication was offered at discharge and the patient refused  Blood Alcohol level:  Lab Results  Component Value Date   [facility] <5 [id number]    Metabolic Disorder Labs:  Lab Results  Component Value Date   HGBA1C 10.2* 03/31/2016   Lab Results  Component Value Date   PROLACTIN 9.6 03/31/2016   Lab Results  Component Value Date   CHOL 211* 03/31/2016   TRIG 295* 03/31/2016   HDL 41 03/31/2016   CHOLHDL 5.1 03/31/2016   VLDL 59* 03/31/2016   LDLCALC 111*  03/31/2016   [place] 85 03/09/2016    See Psychiatric Specialty Exam and Suicide Risk Assessment completed by Attending Physician prior to discharge.  Discharge destination:  Home  Is patient on multiple antipsychotic therapies at discharge:  No   Has Patient had three or more failed trials of antipsychotic monotherapy by history:  No  Recommended Plan for Multiple Antipsychotic Therapies: NA  Discharge Instructions    Diet - low sodium heart healthy    Complete by:  As directed      Increase activity slowly    Complete by:  As directed             Medication List    TAKE these medications      Indication   aspirin EC 81 MG tablet  Take 1 tablet by mouth daily.      irbesartan 150 MG tablet  Commonly known as:  AVAPRO  Take 1 tablet by mouth daily.      LEVEMIR FLEXTOUCH 100 UNIT/ML Pen  Generic drug:  Insulin Detemir  Inject 20 Units into the skin at bedtime.      metFORMIN 500 MG tablet  Commonly known as:  GLUCOPHAGE  Take 1,000 mg by mouth 2 (two) times daily.      metoprolol succinate 50 MG 24 hr tablet  Commonly known as:  TOPROL-XL  Take 50 mg by mouth daily.      simvastatin 40 MG tablet  Commonly known as:  ZOCOR  Take 1 tablet by mouth at bedtime.      spironolactone 25 MG tablet  Commonly known as:  ALDACTONE  Take 25 mg by mouth daily.            Follow-up Information    Follow up with SPARKS,JEFFREY D, MD On 04/08/2016.   Specialty:  Internal Medicine   Why:  Your hospital follow up appointment will be Wed. June 6th at 3:00pm.   Contact information:   [place] [address] [phone]       Follow-up recommendations:  Activity:  as tolerated. Diet:  low sodium heart healthy. Other:  keep follow up appointment.  Comments:    Signed: Orson Slick, MD 04/02/2016, 3:26 PM
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
## 2016-04-02 NOTE — Progress Notes (Signed)
Inpatient Diabetes Program Recommendations  AACE/ADA: New Consensus Statement on Inpatient Glycemic Control (2015)  Target Ranges:  Prepandial:   less than 140 mg/dL      Peak postprandial:   less than 180 mg/dL (1-2 hours)      Critically ill patients:  140 - 180 mg/dL   Lab Results  Component Value Date   GLUCAP 101* 04/02/2016   HGBA1C 10.2* 03/31/2016    Review of Glycemic Control   Results for SELENI, Sabrina [name] (MRN [id number]) as of 04/02/2016 11:29  Ref. Range 04/01/2016 06:30 04/01/2016 11:49 04/01/2016 16:42 04/01/2016 21:06 04/02/2016 06:46  Glucose-Capillary Latest Ref Range: 65-99 mg/dL 145 (H) 122 (H) 115 (H) 118 (H) 101 (H)      Diabetes history: DM 2 Outpatient Diabetes medications: Levemir 20 units, Metformin 1,000 mg BID Current orders for Inpatient glycemic control: Levemir 20 units, Metformin 1,000 mg BID, Novolog 0-15 units tid  Inpatient Diabetes Program Recommendations:  Glucose within goal- CBG ideal.  Noted A1c is 10.2 % which indicates an average glucose in the 240's. Patient with poor glucose control at home, unsure if patient is taking meds consistently at home since glucose looks great here. Patient will need to follow up with PCP at time of discharge.  Gentry Fitz, RN, BA, MHA, CDE Diabetes Coordinator Inpatient Diabetes Program  [phone] (Team Pager) [phone] ([place]) 04/02/2016 11:31 AM

## 2016-04-02 NOTE — Progress Notes (Signed)
Recreation Therapy Notes  Date: 06.08.17 Time: 9:30 am Location: Craft Room  Group Topic: Leisure Education  Goal Area(s) Addresses:  Patient will identify things they are grateful for. Patient will verbalize why it is important to be grateful.  Behavioral Response: Did not attend  Intervention: Grateful Wheel  Activity: Patients were given an I Am Grateful For worksheet and instructed to write things they are grateful for under each category.  Education: LRT educated patients on why it is important to be grateful.  Education Outcome: Patient did not attend group.  Clinical Observations/Feedback: Patient did not attend group.  Leonette Monarch, LRT/CTRS 04/02/2016 10:24 AM

## 2016-04-02 NOTE — BHH Suicide Risk Assessment (Signed)
[facility] Discharge Suicide Risk Assessment   Principal Problem: Brief psychotic disorder Discharge Diagnoses:  Patient Active Problem List   Diagnosis Date Noted  . Diabetes ([place]) [E11.9] 04/01/2016  . Dyslipidemia [E78.5] 04/01/2016  . Brief psychotic disorder [F23] 03/28/2016  . Noncompliance [Z91.19] 03/28/2016  . Essential hypertension [I10] 03/09/2016    Total Time spent with patient: 30 minutes  Musculoskeletal: Strength & Muscle Tone: within normal limits Gait & Station: normal Patient leans: N/A  Psychiatric Specialty Exam: Review of Systems  All other systems reviewed and are negative.   Blood pressure 127/66, pulse 92, temperature 98.8 F (37.1 C), temperature source Oral, resp. rate 18, weight 69.4 kg (153 lb), SpO2 100 %.Body mass index is 28.92 kg/(m^2).  See SRA.                                                     Mental Status Per Nursing Assessment::   On Admission:     Demographic Factors:  Age 25 or older  Loss Factors: NA  Historical Factors: NA  Risk Reduction Factors:   Sense of responsibility to family, Religious beliefs about death and Positive social support  Continued Clinical Symptoms:  Depression:   Insomnia  Cognitive Features That Contribute To Risk:  None    Suicide Risk:  Minimal: No identifiable suicidal ideation.  Patients presenting with no risk factors but with morbid ruminations; may be classified as minimal risk based on the severity of the depressive symptoms  Follow-up Information    Follow up with SPARKS,JEFFREY D, MD On 04/08/2016.   Specialty:  Internal Medicine   Why:  Your hospital follow up appointment will be Wed. June 6th at 3:00pm.   Contact information:   [address] [place] [address] [phone]       Plan Of Care/Follow-up recommendations:  Activity:  as tolerated. Diet:  low sodium heart healthy. Other:  keep follow up appointment.  Orson Slick, MD 04/02/2016, 3:21 PM

## 2016-04-02 NOTE — Progress Notes (Signed)
D: Pt is pleasant and cooperative this evening. She rates depression and anxiety 0/10. Denies SI/HI/AVH at this time. Denies pain. When asked what her goal for today was, she states "I don't do that." Pt refuses evening dose of Depakote and Restoril, stating "I don't need that. Someone lied. I slept all night last night." No episodes of paranoia or confusion noted. A: Emotional support and encouragement provided. Medications administered with education. q15 minute safety checks maintained. R: Pt remains free from harm. Will continue to monitor.

## 2016-04-02 NOTE — BHH Group Notes (Signed)
[place] LCSW Group Therapy  04/02/2016 3:03 PM  Type of Therapy: Group Therapy  Participation Level: Minimal  Participation Quality: Attentive  Affect: Appropriate  Cognitive: Alert  Insight: Limited  Engagement in Therapy: Limited  Modes of Intervention: Discussion, Education, Socialization and Support  Summary of Progress/Problems: Balance in life: Patients will discuss the concept of balance and how it looks and feels to be unbalanced. Pt will identify areas in their life that is unbalanced and ways to become more balanced. Pt attended group and stayed the entire time. Pt sat quietly and listened to other group members share.   [place] MSW, [place]  04/02/2016, 3:03 PM

## 2016-04-02 NOTE — Progress Notes (Signed)
  [facility] Adult Case Management Discharge Plan :  Will you be returning to the same living situation after discharge:  Yes,  home At discharge, do you have transportation home?: Yes,  daughter  Do you have the ability to pay for your medications: Yes,  insurance, income   Release of information consent forms completed and in the chart;  Patient's signature needed at discharge.  Patient to Follow up at: Follow-up Information    Follow up with SPARKS,JEFFREY D, MD On 04/08/2016.   Specialty:  Internal Medicine   Why:  Your hospital follow up appointment will be Wed. June 6th at 3:00pm.   Contact information:   [place] [address] [phone]       Next level of care provider has access to [place] and Suicide Prevention discussed: Yes,  with daughter  Have you used any form of tobacco in the last 30 days? (Cigarettes, Smokeless Tobacco, Cigars, and/or Pipes): No (n/a)  Has patient been referred to the Quitline?: N/A patient is not a smoker  Patient has been referred for addiction treatment: [place] MSW, [place]  04/02/2016, 12:56 PM

## 2016-04-02 NOTE — Progress Notes (Signed)
NUTRITION ASSESSMENT  Pt identified as at risk on the Malnutrition Screen Tool  INTERVENTION: 1. Encourage  the importance of nutrition and intake of food and beverages. 2. Encourage menu completion to best meet pt preferences   NUTRITION DIAGNOSIS: Unintentional weight loss related to sub-optimal intake as evidenced by pt report.   Goal: Pt to meet >/= 90% of their estimated nutrition needs.  Monitor:  PO intake  Assessment:   70 y.o. female admitted with bipolar affective disorder, manic, severe with psychotic behavior  Past Medical History  Diagnosis Date  . Hypertension   . Diabetes mellitus without complication ([place])   . High cholesterol     Height: Ht Readings from Last 1 Encounters:  03/28/16 5\' 1"  (1.549 m)    Weight: Wt Readings from Last 1 Encounters:  04/01/16 153 lb (69.4 kg)    Weight Hx: Wt Readings from Last 10 Encounters:  04/01/16 153 lb (69.4 kg)  03/28/16 155 lb (70.308 kg)  03/09/16 159 lb 6.4 oz (72.303 kg)    BMI:  Body mass index is 28.92 kg/(m^2).  Estimated Nutritional Needs: Kcal: 25-30 kcal/kg Protein: > 1 gram protein/kg Fluid: 1 ml/kcal  Diet Order: Diet heart healthy/carb modified Room service appropriate?: Yes; Fluid consistency:: Thin Diet - low sodium heart healthy Pt is also offered choice of unit snacks mid-morning and mid-afternoon.  Pt is eating as desired. Pt eating >80% of meals on average.  Lab results and medications reviewed.   Kerman Passey [place], [place], LDN [phone] Pager  [phone] Weekend/On-Call Pager

## 2016-04-08 ENCOUNTER — Other Ambulatory Visit: Payer: Self-pay | Admitting: Internal Medicine

## 2016-04-08 DIAGNOSIS — Z1231 Encounter for screening mammogram for malignant neoplasm of breast: Secondary | ICD-10-CM

## 2016-04-08 DIAGNOSIS — I422 Other hypertrophic cardiomyopathy: Secondary | ICD-10-CM | POA: Diagnosis not present

## 2016-04-08 DIAGNOSIS — E119 Type 2 diabetes mellitus without complications: Secondary | ICD-10-CM | POA: Diagnosis not present

## 2016-04-08 DIAGNOSIS — I10 Essential (primary) hypertension: Secondary | ICD-10-CM | POA: Diagnosis not present

## 2016-04-08 DIAGNOSIS — F319 Bipolar disorder, unspecified: Secondary | ICD-10-CM | POA: Diagnosis not present

## 2016-04-08 DIAGNOSIS — I119 Hypertensive heart disease without heart failure: Secondary | ICD-10-CM | POA: Diagnosis not present

## 2016-04-08 DIAGNOSIS — E7800 Pure hypercholesterolemia, unspecified: Secondary | ICD-10-CM | POA: Diagnosis not present

## 2016-04-30 ENCOUNTER — Encounter: Payer: Self-pay | Admitting: Emergency Medicine

## 2016-04-30 ENCOUNTER — Emergency Department: Payer: Commercial Managed Care - HMO

## 2016-04-30 ENCOUNTER — Emergency Department
Admission: EM | Admit: 2016-04-30 | Discharge: 2016-04-30 | Disposition: A | Discharge status: Home / Self Care | Payer: Commercial Managed Care - HMO | Attending: Emergency Medicine | Admitting: Emergency Medicine

## 2016-04-30 DIAGNOSIS — Y92410 Unspecified street and highway as the place of occurrence of the external cause: Secondary | ICD-10-CM | POA: Insufficient documentation

## 2016-04-30 DIAGNOSIS — E119 Type 2 diabetes mellitus without complications: Secondary | ICD-10-CM | POA: Insufficient documentation

## 2016-04-30 DIAGNOSIS — S60012A Contusion of left thumb without damage to nail, initial encounter: Secondary | ICD-10-CM | POA: Diagnosis not present

## 2016-04-30 DIAGNOSIS — Y9389 Activity, other specified: Secondary | ICD-10-CM | POA: Diagnosis not present

## 2016-04-30 DIAGNOSIS — M79645 Pain in left finger(s): Secondary | ICD-10-CM | POA: Diagnosis not present

## 2016-04-30 DIAGNOSIS — Z7982 Long term (current) use of aspirin: Secondary | ICD-10-CM | POA: Diagnosis not present

## 2016-04-30 DIAGNOSIS — Y999 Unspecified external cause status: Secondary | ICD-10-CM | POA: Diagnosis not present

## 2016-04-30 DIAGNOSIS — S161XXA Strain of muscle, fascia and tendon at neck level, initial encounter: Secondary | ICD-10-CM | POA: Insufficient documentation

## 2016-04-30 DIAGNOSIS — Z7984 Long term (current) use of oral hypoglycemic drugs: Secondary | ICD-10-CM | POA: Diagnosis not present

## 2016-04-30 DIAGNOSIS — I10 Essential (primary) hypertension: Secondary | ICD-10-CM | POA: Insufficient documentation

## 2016-04-30 DIAGNOSIS — F312 Bipolar disorder, current episode manic severe with psychotic features: Secondary | ICD-10-CM | POA: Diagnosis not present

## 2016-04-30 DIAGNOSIS — M542 Cervicalgia: Secondary | ICD-10-CM | POA: Diagnosis not present

## 2016-04-30 DIAGNOSIS — M25512 Pain in left shoulder: Secondary | ICD-10-CM | POA: Diagnosis not present
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
## 2016-05-20 DIAGNOSIS — E7800 Pure hypercholesterolemia, unspecified: Secondary | ICD-10-CM | POA: Diagnosis not present

## 2016-05-20 DIAGNOSIS — I10 Essential (primary) hypertension: Secondary | ICD-10-CM | POA: Diagnosis not present

## 2016-05-20 DIAGNOSIS — E119 Type 2 diabetes mellitus without complications: Secondary | ICD-10-CM | POA: Diagnosis not present

## 2016-05-20 DIAGNOSIS — M79602 Pain in left arm: Secondary | ICD-10-CM | POA: Diagnosis not present

## 2016-05-21 ENCOUNTER — Ambulatory Visit
Admission: RE | Admit: 2016-05-21 | Discharge: 2016-05-21 | Disposition: A | Discharge status: Home / Self Care | Payer: Commercial Managed Care - HMO | Source: Ambulatory Visit | Attending: Internal Medicine | Admitting: Internal Medicine

## 2016-05-21 ENCOUNTER — Other Ambulatory Visit: Payer: Self-pay | Admitting: Internal Medicine

## 2016-05-21 DIAGNOSIS — Z1231 Encounter for screening mammogram for malignant neoplasm of breast: Secondary | ICD-10-CM

## 2016-05-21 DIAGNOSIS — N390 Urinary tract infection, site not specified: Secondary | ICD-10-CM | POA: Diagnosis not present

## 2016-05-21 DIAGNOSIS — M25512 Pain in left shoulder: Secondary | ICD-10-CM | POA: Diagnosis not present

## 2016-05-21 DIAGNOSIS — M7582 Other shoulder lesions, left shoulder: Secondary | ICD-10-CM | POA: Diagnosis not present

## 2016-05-21 DIAGNOSIS — M1812 Unilateral primary osteoarthritis of first carpometacarpal joint, left hand: Secondary | ICD-10-CM | POA: Diagnosis not present

## 2016-05-21 DIAGNOSIS — M4722 Other spondylosis with radiculopathy, cervical region: Secondary | ICD-10-CM | POA: Diagnosis not present

## 2016-05-21 DIAGNOSIS — M19012 Primary osteoarthritis, left shoulder: Secondary | ICD-10-CM | POA: Diagnosis not present

## 2016-06-01 DIAGNOSIS — I119 Hypertensive heart disease without heart failure: Secondary | ICD-10-CM | POA: Diagnosis not present

## 2016-06-01 DIAGNOSIS — I209 Angina pectoris, unspecified: Secondary | ICD-10-CM | POA: Diagnosis not present

## 2016-06-01 DIAGNOSIS — I10 Essential (primary) hypertension: Secondary | ICD-10-CM | POA: Diagnosis not present

## 2016-06-01 DIAGNOSIS — E7800 Pure hypercholesterolemia, unspecified: Secondary | ICD-10-CM | POA: Diagnosis not present

## 2016-06-01 DIAGNOSIS — I43 Cardiomyopathy in diseases classified elsewhere: Secondary | ICD-10-CM | POA: Diagnosis not present

## 2016-06-05 ENCOUNTER — Ambulatory Visit: Payer: Commercial Managed Care - HMO | Admitting: Physical Therapy

## 2016-06-05 DIAGNOSIS — M7582 Other shoulder lesions, left shoulder: Secondary | ICD-10-CM | POA: Diagnosis not present

## 2016-06-05 DIAGNOSIS — M19012 Primary osteoarthritis, left shoulder: Secondary | ICD-10-CM | POA: Diagnosis not present

## 2016-06-05 DIAGNOSIS — M4722 Other spondylosis with radiculopathy, cervical region: Secondary | ICD-10-CM | POA: Diagnosis not present

## 2016-06-08 ENCOUNTER — Ambulatory Visit: Payer: Commercial Managed Care - HMO | Admitting: Physical Therapy

## 2016-06-09 DIAGNOSIS — M19012 Primary osteoarthritis, left shoulder: Secondary | ICD-10-CM | POA: Diagnosis not present

## 2016-06-09 DIAGNOSIS — M7582 Other shoulder lesions, left shoulder: Secondary | ICD-10-CM | POA: Diagnosis not present

## 2016-06-09 DIAGNOSIS — M4722 Other spondylosis with radiculopathy, cervical region: Secondary | ICD-10-CM | POA: Diagnosis not present

## 2016-06-10 ENCOUNTER — Encounter: Payer: Commercial Managed Care - HMO | Admitting: Physical Therapy

## 2016-06-11 DIAGNOSIS — M4722 Other spondylosis with radiculopathy, cervical region: Secondary | ICD-10-CM | POA: Diagnosis not present

## 2016-06-11 DIAGNOSIS — M7582 Other shoulder lesions, left shoulder: Secondary | ICD-10-CM | POA: Diagnosis not present

## 2016-06-11 DIAGNOSIS — M19012 Primary osteoarthritis, left shoulder: Secondary | ICD-10-CM | POA: Diagnosis not present

## 2016-06-15 ENCOUNTER — Encounter: Payer: Commercial Managed Care - HMO | Admitting: Physical Therapy

## 2016-06-16 DIAGNOSIS — M4722 Other spondylosis with radiculopathy, cervical region: Secondary | ICD-10-CM | POA: Diagnosis not present

## 2016-06-16 DIAGNOSIS — M7582 Other shoulder lesions, left shoulder: Secondary | ICD-10-CM | POA: Diagnosis not present

## 2016-06-16 DIAGNOSIS — M19012 Primary osteoarthritis, left shoulder: Secondary | ICD-10-CM | POA: Diagnosis not present

## 2016-06-18 DIAGNOSIS — M19012 Primary osteoarthritis, left shoulder: Secondary | ICD-10-CM | POA: Diagnosis not present

## 2016-06-18 DIAGNOSIS — M4722 Other spondylosis with radiculopathy, cervical region: Secondary | ICD-10-CM | POA: Diagnosis not present

## 2016-06-18 DIAGNOSIS — M7582 Other shoulder lesions, left shoulder: Secondary | ICD-10-CM | POA: Diagnosis not present

## 2016-06-23 DIAGNOSIS — M7582 Other shoulder lesions, left shoulder: Secondary | ICD-10-CM | POA: Diagnosis not present

## 2016-06-23 DIAGNOSIS — M4722 Other spondylosis with radiculopathy, cervical region: Secondary | ICD-10-CM | POA: Diagnosis not present

## 2016-06-23 DIAGNOSIS — M19012 Primary osteoarthritis, left shoulder: Secondary | ICD-10-CM | POA: Diagnosis not present

## 2016-06-25 DIAGNOSIS — M4722 Other spondylosis with radiculopathy, cervical region: Secondary | ICD-10-CM | POA: Diagnosis not present

## 2016-06-25 DIAGNOSIS — M19012 Primary osteoarthritis, left shoulder: Secondary | ICD-10-CM | POA: Diagnosis not present

## 2016-06-25 DIAGNOSIS — M7582 Other shoulder lesions, left shoulder: Secondary | ICD-10-CM | POA: Diagnosis not present

## 2016-06-30 DIAGNOSIS — M7582 Other shoulder lesions, left shoulder: Secondary | ICD-10-CM | POA: Diagnosis not present

## 2016-06-30 DIAGNOSIS — M4722 Other spondylosis with radiculopathy, cervical region: Secondary | ICD-10-CM | POA: Diagnosis not present

## 2016-06-30 DIAGNOSIS — M19012 Primary osteoarthritis, left shoulder: Secondary | ICD-10-CM | POA: Diagnosis not present

## 2016-07-02 DIAGNOSIS — M19012 Primary osteoarthritis, left shoulder: Secondary | ICD-10-CM | POA: Diagnosis not present

## 2016-07-02 DIAGNOSIS — M7582 Other shoulder lesions, left shoulder: Secondary | ICD-10-CM | POA: Diagnosis not present

## 2016-07-02 DIAGNOSIS — M4722 Other spondylosis with radiculopathy, cervical region: Secondary | ICD-10-CM | POA: Diagnosis not present

## 2016-07-06 DIAGNOSIS — M1812 Unilateral primary osteoarthritis of first carpometacarpal joint, left hand: Secondary | ICD-10-CM | POA: Diagnosis not present

## 2016-07-06 DIAGNOSIS — M19012 Primary osteoarthritis, left shoulder: Secondary | ICD-10-CM | POA: Diagnosis not present

## 2016-07-06 DIAGNOSIS — M79642 Pain in left hand: Secondary | ICD-10-CM | POA: Diagnosis not present

## 2016-07-06 DIAGNOSIS — M7582 Other shoulder lesions, left shoulder: Secondary | ICD-10-CM | POA: Diagnosis not present

## 2016-08-19 DIAGNOSIS — M7582 Other shoulder lesions, left shoulder: Secondary | ICD-10-CM | POA: Diagnosis not present

## 2016-08-19 DIAGNOSIS — M19012 Primary osteoarthritis, left shoulder: Secondary | ICD-10-CM | POA: Diagnosis not present

## 2016-08-19 DIAGNOSIS — M1812 Unilateral primary osteoarthritis of first carpometacarpal joint, left hand: Secondary | ICD-10-CM | POA: Diagnosis not present

## 2016-08-21 DIAGNOSIS — E7800 Pure hypercholesterolemia, unspecified: Secondary | ICD-10-CM | POA: Diagnosis not present

## 2016-08-21 DIAGNOSIS — E1165 Type 2 diabetes mellitus with hyperglycemia: Secondary | ICD-10-CM | POA: Diagnosis not present

## 2016-08-21 DIAGNOSIS — I10 Essential (primary) hypertension: Secondary | ICD-10-CM | POA: Diagnosis not present

## 2016-08-21 DIAGNOSIS — Z79899 Other long term (current) drug therapy: Secondary | ICD-10-CM | POA: Diagnosis not present

## 2016-08-21 DIAGNOSIS — E538 Deficiency of other specified B group vitamins: Secondary | ICD-10-CM | POA: Diagnosis not present

## 2016-08-21 DIAGNOSIS — E118 Type 2 diabetes mellitus with unspecified complications: Secondary | ICD-10-CM | POA: Diagnosis not present

## 2016-12-25 DIAGNOSIS — I10 Essential (primary) hypertension: Secondary | ICD-10-CM | POA: Diagnosis not present

## 2016-12-25 DIAGNOSIS — E119 Type 2 diabetes mellitus without complications: Secondary | ICD-10-CM | POA: Diagnosis not present

## 2016-12-25 DIAGNOSIS — I43 Cardiomyopathy in diseases classified elsewhere: Secondary | ICD-10-CM | POA: Diagnosis not present

## 2016-12-25 DIAGNOSIS — I119 Hypertensive heart disease without heart failure: Secondary | ICD-10-CM | POA: Diagnosis not present

## 2016-12-25 DIAGNOSIS — E538 Deficiency of other specified B group vitamins: Secondary | ICD-10-CM | POA: Diagnosis not present

## 2016-12-25 DIAGNOSIS — E7800 Pure hypercholesterolemia, unspecified: Secondary | ICD-10-CM | POA: Diagnosis not present

## 2016-12-25 DIAGNOSIS — Z79899 Other long term (current) drug therapy: Secondary | ICD-10-CM | POA: Diagnosis not present

## 2017-02-15 DIAGNOSIS — E119 Type 2 diabetes mellitus without complications: Secondary | ICD-10-CM | POA: Diagnosis not present

## 2017-02-15 DIAGNOSIS — E7800 Pure hypercholesterolemia, unspecified: Secondary | ICD-10-CM | POA: Diagnosis not present

## 2017-02-15 DIAGNOSIS — Z79899 Other long term (current) drug therapy: Secondary | ICD-10-CM | POA: Diagnosis not present

## 2017-02-15 DIAGNOSIS — N390 Urinary tract infection, site not specified: Secondary | ICD-10-CM | POA: Diagnosis not present

## 2017-02-15 DIAGNOSIS — I10 Essential (primary) hypertension: Secondary | ICD-10-CM | POA: Diagnosis not present

## 2017-02-19 DIAGNOSIS — Z794 Long term (current) use of insulin: Secondary | ICD-10-CM | POA: Diagnosis not present

## 2017-02-19 DIAGNOSIS — I10 Essential (primary) hypertension: Secondary | ICD-10-CM | POA: Diagnosis not present

## 2017-02-19 DIAGNOSIS — E1122 Type 2 diabetes mellitus with diabetic chronic kidney disease: Secondary | ICD-10-CM | POA: Diagnosis not present

## 2017-02-19 DIAGNOSIS — Z79899 Other long term (current) drug therapy: Secondary | ICD-10-CM | POA: Diagnosis not present

## 2017-02-19 DIAGNOSIS — N183 Chronic kidney disease, stage 3 (moderate): Secondary | ICD-10-CM | POA: Diagnosis not present

## 2017-02-19 DIAGNOSIS — E1165 Type 2 diabetes mellitus with hyperglycemia: Secondary | ICD-10-CM | POA: Diagnosis not present

## 2017-02-19 DIAGNOSIS — E7800 Pure hypercholesterolemia, unspecified: Secondary | ICD-10-CM | POA: Diagnosis not present

## 2017-02-19 DIAGNOSIS — E118 Type 2 diabetes mellitus with unspecified complications: Secondary | ICD-10-CM | POA: Diagnosis not present

## 2017-04-08 ENCOUNTER — Other Ambulatory Visit: Payer: Self-pay | Admitting: Internal Medicine

## 2017-04-08 DIAGNOSIS — Z1231 Encounter for screening mammogram for malignant neoplasm of breast: Secondary | ICD-10-CM

## 2017-05-24 ENCOUNTER — Ambulatory Visit
Admission: RE | Admit: 2017-05-24 | Discharge: 2017-05-24 | Disposition: A | Discharge status: Home / Self Care | Payer: Commercial Managed Care - HMO | Source: Ambulatory Visit | Attending: Internal Medicine | Admitting: Internal Medicine

## 2017-05-24 DIAGNOSIS — Z1231 Encounter for screening mammogram for malignant neoplasm of breast: Secondary | ICD-10-CM | POA: Diagnosis not present

## 2017-05-26 DIAGNOSIS — Z794 Long term (current) use of insulin: Secondary | ICD-10-CM | POA: Diagnosis not present

## 2017-05-26 DIAGNOSIS — I10 Essential (primary) hypertension: Secondary | ICD-10-CM | POA: Diagnosis not present

## 2017-05-26 DIAGNOSIS — E1165 Type 2 diabetes mellitus with hyperglycemia: Secondary | ICD-10-CM | POA: Diagnosis not present

## 2017-05-26 DIAGNOSIS — Z79899 Other long term (current) drug therapy: Secondary | ICD-10-CM | POA: Diagnosis not present

## 2017-05-26 DIAGNOSIS — E7800 Pure hypercholesterolemia, unspecified: Secondary | ICD-10-CM | POA: Diagnosis not present

## 2017-05-26 DIAGNOSIS — E118 Type 2 diabetes mellitus with unspecified complications: Secondary | ICD-10-CM | POA: Diagnosis not present

## 2017-05-27 DIAGNOSIS — E113393 Type 2 diabetes mellitus with moderate nonproliferative diabetic retinopathy without macular edema, bilateral: Secondary | ICD-10-CM | POA: Diagnosis not present

## 2017-05-27 DIAGNOSIS — H524 Presbyopia: Secondary | ICD-10-CM | POA: Diagnosis not present

## 2017-06-02 DIAGNOSIS — E7800 Pure hypercholesterolemia, unspecified: Secondary | ICD-10-CM | POA: Diagnosis not present

## 2017-06-02 DIAGNOSIS — I43 Cardiomyopathy in diseases classified elsewhere: Secondary | ICD-10-CM | POA: Diagnosis not present

## 2017-06-02 DIAGNOSIS — E1165 Type 2 diabetes mellitus with hyperglycemia: Secondary | ICD-10-CM | POA: Diagnosis not present

## 2017-06-02 DIAGNOSIS — E118 Type 2 diabetes mellitus with unspecified complications: Secondary | ICD-10-CM | POA: Diagnosis not present

## 2017-06-02 DIAGNOSIS — I119 Hypertensive heart disease without heart failure: Secondary | ICD-10-CM | POA: Diagnosis not present

## 2017-06-02 DIAGNOSIS — I10 Essential (primary) hypertension: Secondary | ICD-10-CM | POA: Diagnosis not present

## 2017-06-02 DIAGNOSIS — Z794 Long term (current) use of insulin: Secondary | ICD-10-CM | POA: Diagnosis not present

## 2017-08-30 DIAGNOSIS — I10 Essential (primary) hypertension: Secondary | ICD-10-CM | POA: Diagnosis not present

## 2017-08-30 DIAGNOSIS — E1165 Type 2 diabetes mellitus with hyperglycemia: Secondary | ICD-10-CM | POA: Diagnosis not present

## 2017-08-30 DIAGNOSIS — Z79899 Other long term (current) drug therapy: Secondary | ICD-10-CM | POA: Diagnosis not present

## 2017-08-30 DIAGNOSIS — I43 Cardiomyopathy in diseases classified elsewhere: Secondary | ICD-10-CM | POA: Diagnosis not present

## 2017-08-30 DIAGNOSIS — E7800 Pure hypercholesterolemia, unspecified: Secondary | ICD-10-CM | POA: Diagnosis not present

## 2017-08-30 DIAGNOSIS — N8110 Cystocele, unspecified: Secondary | ICD-10-CM | POA: Diagnosis not present

## 2017-08-30 DIAGNOSIS — E118 Type 2 diabetes mellitus with unspecified complications: Secondary | ICD-10-CM | POA: Diagnosis not present

## 2017-08-30 DIAGNOSIS — I119 Hypertensive heart disease without heart failure: Secondary | ICD-10-CM | POA: Diagnosis not present

## 2017-09-09 ENCOUNTER — Ambulatory Visit (INDEPENDENT_AMBULATORY_CARE_PROVIDER_SITE_OTHER): Payer: Medicare HMO | Admitting: Obstetrics and Gynecology

## 2017-09-09 ENCOUNTER — Encounter: Payer: Self-pay | Admitting: Obstetrics and Gynecology

## 2017-09-09 VITALS — BP 179/80 | HR 71 | Ht 64.0 in | Wt 164.8 lb

## 2017-09-09 DIAGNOSIS — N763 Subacute and chronic vulvitis: Secondary | ICD-10-CM | POA: Diagnosis not present

## 2017-09-09 DIAGNOSIS — N812 Incomplete uterovaginal prolapse: Secondary | ICD-10-CM | POA: Diagnosis not present

## 2017-09-09 DIAGNOSIS — I10 Essential (primary) hypertension: Secondary | ICD-10-CM

## 2017-09-09 DIAGNOSIS — E119 Type 2 diabetes mellitus without complications: Secondary | ICD-10-CM | POA: Diagnosis not present

## 2017-09-09 DIAGNOSIS — N8111 Cystocele, midline: Secondary | ICD-10-CM

## 2017-09-09 MED ORDER — NYSTATIN-TRIAMCINOLONE 100000-0.1 UNIT/GM-% EX OINT: 1.0000 "application " | TOPICAL_OINTMENT | Freq: Two times a day (BID) | CUTANEOUS | 0 refills | Status: DC

## 2017-09-09 MED ORDER — CLOTRIMAZOLE-BETAMETHASONE 1-0.05 % EX CREA: 1.0000 "application " | TOPICAL_CREAM | Freq: Two times a day (BID) | CUTANEOUS | 0 refills | Status: DC

## 2017-09-09 NOTE — Patient Instructions (Signed)
1.  TVH BSO with anterior colporrhaphy is scheduled for the third week in February 2.  Return the second week of February for a preop appointment 3.  Keep appointment with Dr. Doy Hutching in the first week of February as scheduled to be sure that you have preop clearance-blood sugar under control and blood pressure under control

## 2017-09-15 NOTE — Progress Notes (Signed)
GYN ENCOUNTER NOTE  Subjective: NEW PATIENT ENCOUNTER       Sabrina [name] is a 71 y.o. No obstetric history on file. female is here for gynecologic evaluation of the following issues:  1.  Uterine prolapse.    71 year old African-American female para 3013, menopausal, on no hormone replacement therapy, presents for evaluation and management of uterine prolapse. Symptom onset was approximately 5-6 years ago.  She developed a bulge/mass protruding from the vagina.  Symptoms have progressively worsened and she desires surgical intervention if possible.  She is not interested in pessary management. Patient was seen [facility] clinic OB/GYN department and was referred to urogynecology for management; patient did not keep that follow-up because her insurance was not accepted by the tertiary care facility.  GU symptoms: Urinary frequency-5 times per day Nocturia-none Stress urinary incontinence-no loss of urine with coughing sneezing laughing or lifting; she does not need to wear protection because of incontinence. Urge symptoms-negative Patient is not sexually active.  GI symptoms: Bowel movement frequency 1-2/day No reported pain with bowel movements; patient does not need to splint and does not have chronic constipation.    Menarche-age 29 Menopause-age 33 No history of HRT use No history of STDs  Obstetric History Para 3013, NSVD x3  Past Medical History:  Diagnosis Date  . Diabetes mellitus without complication ([place])   . High cholesterol   . Hypertension     Past Surgical History:  Procedure Laterality Date  . POLYPECTOMY      Current Outpatient Medications on File Prior to Visit  Medication Sig Dispense Refill  . aspirin EC 81 MG tablet Take 1 tablet by mouth daily.    . bisoprolol (ZEBETA) 5 MG tablet Take by mouth.    . metFORMIN (GLUCOPHAGE) 500 MG tablet Take 1,000 mg by mouth 2 (two) times daily.  10  . simvastatin (ZOCOR) 40 MG tablet Take 1 tablet by mouth at  bedtime.  4  . spironolactone (ALDACTONE) 25 MG tablet Take 25 mg by mouth daily.  0   No current facility-administered medications on file prior to visit.     Allergies  Allergen Reactions  . Glimepiride Diarrhea and Other (See Comments)  . Glipizide Other (See Comments)  . Lisinopril     Other reaction(s): Headache  . Losartan     Other reaction(s): Headache  . Norco [Hydrocodone-Acetaminophen]   . Pioglitazone Swelling    On legs    Social History   Socioeconomic History  . Marital status: Divorced    Spouse name: Not on file  . Number of children: Not on file  . Years of education: Not on file  . Highest education level: Not on file  Social Needs  . Financial resource strain: Not on file  . Food insecurity - worry: Not on file  . Food insecurity - inability: Not on file  . Transportation needs - medical: Not on file  . Transportation needs - non-medical: Not on file  Occupational History  . Occupation: retired  Tobacco Use  . Smoking status: Never Smoker  . Smokeless tobacco: Never Used  Substance and Sexual Activity  . Alcohol use: No  . Drug use: No  . Sexual activity: Not on file  Other Topics Concern  . Not on file  Social History Narrative  . Not on file    Family History  Problem Relation Age of Onset  . Breast cancer Maternal Aunt        2 aunts in their 66's  .  Breast cancer Maternal Aunt 70    The following portions of the patient's history were reviewed and updated as appropriate: allergies, current medications, past family history, past medical history, past social history, past surgical history and problem list.  Review of Systems Review of Systems  Constitutional: Negative.   HENT: Negative.   Eyes: Negative.   Respiratory: Negative.   Cardiovascular: Negative.   Gastrointestinal: Positive for diarrhea. Negative for abdominal pain, blood in stool, constipation, melena, nausea and vomiting.  Genitourinary: Negative.        Pelvic  pressure Bulge at introitus times 5 years Vulvar itching  Musculoskeletal: Negative.   Skin: Negative.   Neurological: Negative.   Endo/Heme/Allergies: Negative.        History of diabetes, poorly controlled  Psychiatric/Behavioral: Negative.     Objective:   BP (!) 179/80   Pulse 71   Ht 5\' 4"  (1.626 m)   Wt 164 lb 12.8 oz (74.8 kg)   BMI 28.29 kg/m  CONSTITUTIONAL: Well-developed, well-nourished female in no acute distress.  HENT:  Normocephalic, atraumatic.  NECK: Normal range of motion, supple, no masses.  Normal thyroid.  SKIN: Skin is warm and dry. No rash noted. Not diaphoretic. No erythema. No pallor. [place]: Alert and oriented to person, place, and time. PSYCHIATRIC: Normal mood and affect. Normal behavior. Normal judgment and thought content. CARDIOVASCULAR:Not Examined RESPIRATORY: Not Examined BREASTS: Not Examined ABDOMEN: Soft, non distended; Non tender.  No Organomegaly. PELVIC:  External Genitalia: Vulvar hyperemia and epithelial thickening with satellite lesions, consistent with chronic Monilia  BUS: Normal  Vagina: Mild atrophic changes; second to third-degree cystocele, mild rectocele  Cervix: Normal; no lesions; no cervical motion tenderness; prolapsed to the introitus  Uterus: Normal size, shape,consistency, mobile, prolapsed to the introitus  Adnexa: Normal; nonpalpable nontender  RV: Normal external exam  Bladder: Nontender MUSCULOSKELETAL: Normal range of motion. No tenderness.  No cyanosis, clubbing, or edema.     Assessment:   1. Incomplete uterine prolapse  2. Cystocele, midline, second degree  3. Chronic vulvitis, consistent with monilia  4. Diabetes mellitus, type II, suboptimal control  5. Essential hypertension     Plan:   1.  Nystatin/triamcinolone cream topically to the vulva twice a day for 14 days 2.  Recommend follow-up with Dr. Doy Hutching in internal medicine with gaining improved control of diabetes prior to surgery, and  preop clearance. 3.  TVH BSO with anterior colporrhaphy to be scheduled for third week of February 2019.  A total of 30 minutes were spent face-to-face with the patient during the encounter with greater than 50% dealing with counseling and coordination of care.  Brayton Mars, MD  Note: This dictation was prepared with Dragon dictation along with smaller phrase technology. Any transcriptional errors that result from this process are unintentional.

## 2017-11-29 DIAGNOSIS — I422 Other hypertrophic cardiomyopathy: Secondary | ICD-10-CM | POA: Diagnosis not present

## 2017-11-29 DIAGNOSIS — I43 Cardiomyopathy in diseases classified elsewhere: Secondary | ICD-10-CM | POA: Diagnosis not present

## 2017-11-29 DIAGNOSIS — D509 Iron deficiency anemia, unspecified: Secondary | ICD-10-CM | POA: Diagnosis not present

## 2017-11-29 DIAGNOSIS — E7800 Pure hypercholesterolemia, unspecified: Secondary | ICD-10-CM | POA: Diagnosis not present

## 2017-11-29 DIAGNOSIS — Z79899 Other long term (current) drug therapy: Secondary | ICD-10-CM | POA: Diagnosis not present

## 2017-11-29 DIAGNOSIS — I10 Essential (primary) hypertension: Secondary | ICD-10-CM | POA: Diagnosis not present

## 2017-11-29 DIAGNOSIS — E118 Type 2 diabetes mellitus with unspecified complications: Secondary | ICD-10-CM | POA: Diagnosis not present

## 2017-11-29 DIAGNOSIS — I119 Hypertensive heart disease without heart failure: Secondary | ICD-10-CM | POA: Diagnosis not present

## 2017-11-29 DIAGNOSIS — E538 Deficiency of other specified B group vitamins: Secondary | ICD-10-CM | POA: Diagnosis not present

## 2017-11-30 DIAGNOSIS — E118 Type 2 diabetes mellitus with unspecified complications: Secondary | ICD-10-CM | POA: Diagnosis not present

## 2017-11-30 DIAGNOSIS — I10 Essential (primary) hypertension: Secondary | ICD-10-CM | POA: Diagnosis not present

## 2017-11-30 DIAGNOSIS — Z79899 Other long term (current) drug therapy: Secondary | ICD-10-CM | POA: Diagnosis not present

## 2017-11-30 DIAGNOSIS — E7800 Pure hypercholesterolemia, unspecified: Secondary | ICD-10-CM | POA: Diagnosis not present

## 2017-11-30 DIAGNOSIS — N390 Urinary tract infection, site not specified: Secondary | ICD-10-CM | POA: Diagnosis not present

## 2017-11-30 DIAGNOSIS — E1165 Type 2 diabetes mellitus with hyperglycemia: Secondary | ICD-10-CM | POA: Diagnosis not present

## 2017-12-08 ENCOUNTER — Other Ambulatory Visit: Payer: Self-pay

## 2017-12-08 ENCOUNTER — Encounter: Payer: Self-pay | Admitting: Obstetrics and Gynecology

## 2017-12-08 ENCOUNTER — Encounter
Admission: RE | Admit: 2017-12-08 | Discharge: 2017-12-08 | Disposition: A | Discharge status: Home / Self Care | Payer: Medicare HMO | Source: Ambulatory Visit | Attending: Obstetrics and Gynecology | Admitting: Obstetrics and Gynecology

## 2017-12-08 ENCOUNTER — Ambulatory Visit (INDEPENDENT_AMBULATORY_CARE_PROVIDER_SITE_OTHER): Payer: Medicare HMO | Admitting: Obstetrics and Gynecology

## 2017-12-08 VITALS — BP 167/76 | HR 71 | Ht 64.0 in | Wt 165.0 lb

## 2017-12-08 DIAGNOSIS — Z01810 Encounter for preprocedural cardiovascular examination: Secondary | ICD-10-CM | POA: Insufficient documentation

## 2017-12-08 DIAGNOSIS — I10 Essential (primary) hypertension: Secondary | ICD-10-CM | POA: Insufficient documentation

## 2017-12-08 DIAGNOSIS — Z01818 Encounter for other preprocedural examination: Secondary | ICD-10-CM

## 2017-12-08 DIAGNOSIS — N812 Incomplete uterovaginal prolapse: Secondary | ICD-10-CM | POA: Insufficient documentation

## 2017-12-08 DIAGNOSIS — Z01812 Encounter for preprocedural laboratory examination: Secondary | ICD-10-CM | POA: Insufficient documentation

## 2017-12-08 DIAGNOSIS — N8111 Cystocele, midline: Secondary | ICD-10-CM

## 2017-12-08 DIAGNOSIS — E119 Type 2 diabetes mellitus without complications: Secondary | ICD-10-CM

## 2017-12-08 LAB — RAPID HIV SCREEN (HIV 1/2 AB+AG)
HIV 1/2 ANTIBODIES: NONREACTIVE
HIV-1 P24 Antigen - HIV24: NONREACTIVE

## 2017-12-08 LAB — CBC WITH DIFFERENTIAL/PLATELET
BASOS ABS: 0 10*3/uL (ref 0–0.1)
Basophils Relative: 0 %
Eosinophils Absolute: 0.1 10*3/uL (ref 0–0.7)
Eosinophils Relative: 1 %
HCT: 43.3 % (ref 35.0–47.0)
HEMOGLOBIN: 14.2 g/dL (ref 12.0–16.0)
LYMPHS ABS: 2.3 10*3/uL (ref 1.0–3.6)
LYMPHS PCT: 39 %
MCH: 29.7 pg (ref 26.0–34.0)
MCHC: 32.7 g/dL (ref 32.0–36.0)
MCV: 90.5 fL (ref 80.0–100.0)
Monocytes Absolute: 0.3 10*3/uL (ref 0.2–0.9)
Monocytes Relative: 6 %
NEUTROS PCT: 54 %
Neutro Abs: 3.2 10*3/uL (ref 1.4–6.5)
PLATELETS: 318 10*3/uL (ref 150–440)
RBC: 4.78 MIL/uL (ref 3.80–5.20)
RDW: 13.5 % (ref 11.5–14.5)
WBC: 5.9 10*3/uL (ref 3.6–11.0)

## 2017-12-08 LAB — COMPREHENSIVE METABOLIC PANEL
ALK PHOS: 73 U/L (ref 38–126)
ALT: 22 U/L (ref 14–54)
AST: 28 U/L (ref 15–41)
Albumin: 4.4 g/dL (ref 3.5–5.0)
Anion gap: 12 (ref 5–15)
BUN: 19 mg/dL (ref 6–20)
CALCIUM: 9.8 mg/dL (ref 8.9–10.3)
CHLORIDE: 99 mmol/L — AB (ref 101–111)
CO2: 26 mmol/L (ref 22–32)
CREATININE: 0.92 mg/dL (ref 0.44–1.00)
Glucose, Bld: 347 mg/dL — ABNORMAL HIGH (ref 65–99)
Potassium: 4.2 mmol/L (ref 3.5–5.1)
SODIUM: 137 mmol/L (ref 135–145)
Total Bilirubin: 0.9 mg/dL (ref 0.3–1.2)
Total Protein: 7.9 g/dL (ref 6.5–8.1)

## 2017-12-08 LAB — TYPE AND SCREEN
ABO/RH(D): A POS
ANTIBODY SCREEN: NEGATIVE

## 2017-12-08 NOTE — Pre-Procedure Instructions (Signed)
Called and faxed medical clearance info to Dr Enzo Bi and Dr Doy Hutching.

## 2017-12-08 NOTE — Progress Notes (Signed)
PREOPERATIVE HISTORY AND PHYSICAL  Date of surgery: 12/13/2017 Diagnosis: 1.  Incomplete uterine prolapse 2.  Second-degree cystocele Procedure: TVH BSO with anterior colporrhaphy   Patient is a 72 y.o. G5P3030female scheduled for surgery on 12/13/2017.  Patient is to undergo TVH BSO with anterior colporrhaphy for management of uterine prolapse and second-degree cystocele.  The patient is a 72 year old African-American female para 3013, menopausal, on no hormone replacement therapy, presents for evaluation and management of uterine prolapse. Symptom onset was approximately 5-6 years ago.  She developed a bulge/mass protruding from the vagina.  Symptoms have progressively worsened and she desires surgical intervention if possible.  She is not interested in pessary management. Patient was seen [facility] clinic OB/GYN department and was referred to urogynecology for management; patient did not keep that follow-up because her insurance was not accepted by the tertiary care facility.  GU symptoms: Urinary frequency-5 times per day Nocturia-none Stress urinary incontinence-no loss of urine with coughing sneezing laughing or lifting; she does not need to wear protection because of incontinence. Urge symptoms-negative Patient is not sexually active.  GI symptoms: Bowel movement frequency 1-2/day No reported pain with bowel movements; patient does not need to splint and does not have chronic constipation.    Menarche-age 61 Menopause-age 70 No history of HRT use No history of STDs Para 3013, NSVD x3  OB History    Gravida Para Term Preterm AB Living   5 3 3   2 3    SAB TAB Ectopic Multiple Live Births   1 1     3       No LMP recorded. Patient is postmenopausal.    Past Medical History:  Diagnosis Date  . Diabetes mellitus without complication ([place])   . High cholesterol   . Hypertension     Past Surgical History:  Procedure Laterality Date  . POLYPECTOMY      OB History   Gravida Para Term Preterm AB Living  5 3 3   2 3   SAB TAB Ectopic Multiple Live Births  1 1     3     # Outcome Date GA Lbr Len/2nd Weight Sex Delivery Anes PTL Lv  5 SAB           4 TAB           3 Term      Vag-Spont   LIV  2 Term      Vag-Spont   LIV  1 Term      Vag-Spont   LIV      Social History   Socioeconomic History  . Marital status: Divorced    Spouse name: None  . Number of children: None  . Years of education: None  . Highest education level: None  Social Needs  . Financial resource strain: None  . Food insecurity - worry: None  . Food insecurity - inability: None  . Transportation needs - medical: None  . Transportation needs - non-medical: None  Occupational History  . Occupation: retired  Tobacco Use  . Smoking status: Never Smoker  . Smokeless tobacco: Never Used  Substance and Sexual Activity  . Alcohol use: No  . Drug use: No  . Sexual activity: Not Currently    Birth control/protection: Post-menopausal  Other Topics Concern  . None  Social History Narrative  . None    Family History  Problem Relation Age of Onset  . Breast cancer Maternal Aunt        2 aunts in their 37's  .  Breast cancer Maternal Aunt 70  . Ovarian cancer Neg Hx   . Colon cancer Neg Hx      (Not in a hospital admission)  Allergies  Allergen Reactions  . Glimepiride Diarrhea and Other (See Comments)  . Glipizide Other (See Comments)  . Lisinopril Other (See Comments)    headache  . Losartan Other (See Comments)    Headache  . Norco [Hydrocodone-Acetaminophen] Other (See Comments)    Mood changes  . Pioglitazone Swelling    On legs    Review of Systems Constitutional: No recent fever/chills/sweats Respiratory: No recent cough/bronchitis Cardiovascular: No chest pain Gastrointestinal: No recent nausea/vomiting/diarrhea Genitourinary: No UTI symptoms Hematologic/lymphatic:No history of coagulopathy or recent blood thinner use    Objective:    BP (!)  167/76   Pulse 71   Ht 5\' 4"  (1.626 m)   Wt 165 lb (74.8 kg)   BMI 28.32 kg/m   General:   Normal  Skin:   normal  HEENT:  Normal  Neck:  Supple without Adenopathy or Thyromegaly  Lungs:   Heart:              Breasts:   Abdomen:  Pelvis:  M/S   Extremeties:  Neuro:    clear to auscultation bilaterally   Normal without murmur   Not Examined   soft, non-tender; bowel sounds normal; no masses,  no organomegaly   Exam deferred to OR  No CVAT  Warm/Dry   Normal        12/08/2017 PELVIC:             External Genitalia: Normal             Vagina: Mild atrophic changes; second to third-degree cystocele, mild rectocele             Cervix: Normal; no lesions; no cervical motion tenderness; prolapsed to the introitus             Uterus: Normal size, shape,consistency, mobile, prolapsed to the introitus             Adnexa: Normal; nonpalpable nontender             RV: Normal external exam             Bladder: Nontender    Assessment:    1.  Uterine prolapse, incomplete 2.  Second to third-degree cystocele   Plan:  TVH BSO with anterior colporrhaphy   Preop counseling: The patient is to undergo TVH BSO with anterior colporrhaphy on 12/13/2017.  She is understanding of the planned procedure and is aware of and is accepting of all surgical risks which include but are not limited to bleeding, infection, pelvic organ injury with need for repair, blood clot disorders, anesthesia risk, etc.  All questions have been answered.  Informed consent is given.  Patient is ready willing to proceed with surgery as scheduled.  Brayton Mars, MD  Note: This dictation was prepared with Dragon dictation along with smaller phrase technology. Any transcriptional errors that result from this process are unintentional.

## 2017-12-08 NOTE — Pre-Procedure Instructions (Signed)
Component Name Value Ref Range  Vent Rate (bpm) 74   PR Interval (msec) 130   QRS Interval (msec) 78   QT Interval (msec) 402   QTc (msec) 446   Result Narrative  Normal sinus rhythm Left axis deviation Minimal voltage criteria for LVH, may be normal variant Inferior infarct , age undetermined Anterolateral infarct , age undetermined Abnormal ECG No previous ECGs available I reviewed and concur with this report. Electronically signed [id number] MD, Darnell Level ([id number]) on 01/20/2016 8:39:31 AM   Component Name Value Ref Range  LV Ejection Fraction (%) 55   Aortic Valve Stenosis Grade none   Aortic Valve Regurgitation Grade none   Aortic Valve Max Velocity (m/s) 1.1 m/sec   Mitral Valve Stenosis Grade none   Mitral Valve Regurgitation Grade mild   Tricuspid Valve Regurgitation Grade mild   Tricuspid Valve Regurgitation Max Velocity (m/s) 2.4 m/sec   Right Ventricle Systolic Pressure (mmHg) 13.0 mmHg   LV End Diastolic Diameter (cm) 4 cm  LV End Systolic Diameter (cm) 2.6 cm  LV Septum Wall Thickness (cm) 1.5 cm  LV Posterior Wall Thickness (cm) 1.1 cm  Left Atrium Diameter (cm) 3.9 cm  Result Narrative   CARDIOLOGY DEPARTMENT Sabrina [name], Sabrina [name] [id number] [place] #: [id number] [address] [name], [place] [address] Date: 01/29/2016 08:17 AM  Adult Female Age: 72 yrs  ECHOCARDIOGRAM REPORT Outpatient  KC::KCWC STUDY:CHEST WALL TAPE: MD1:KOWALSKI, BRUCE JAY  ECHO:Yes DOPPLER:YesFILE: BP: 180/86  mmHg COLOR:YesCONTRAST:NoMACHINE:PhilipsHeight: 76 in RV BIOPSY:No 3D:No SOUND QLTY:Moderate Weight: 167 lb  MEDIUM:None BSA: 1.7 m2  ___________________________________________________________________________________________  HISTORY:DOE REASON:Assess, LV function INDICATION:Stable angina pectoris (CMS-HCC) [I20.8 (ICD-10-CM)]  ___________________________________________________________________________________________ ECHOCARDIOGRAPHIC MEASUREMENTS 2D DIMENSIONS AORTA ValuesNormal RangeMAIN PAValuesNormal Range Annulus:1.7 cm[2.1 - 2.5]PA Main:nm* [1.5 - 2.1] Aorta Sin:nm* [2.7 - 3.3] RIGHT VENTRICLE ST Junction:nm* [2.3 - 2.9]RV Base:nm* [ < 4.2] Asc.Aorta:nm* [2.3 - 3.1] RV Mid:nm* [ < 3.5]  LEFT VENTRICLERV Length:nm* [ < 8.6] LVIDd:4.0 cm[3.9 - 5.3] INFERIOR VENA CAVA LVIDs:2.6 cmMax. IVC:nm* [ <= 2.1]  FS:34.7 %[> 25]Min. IVC:nm* SWT:1.5 cm[0.5 - 0.9] ------------------ PWT:1.1 cm[0.5 - 0.9] nm* - not measured  LEFT ATRIUM LA Diam:3.9 cm[2.7 - 3.8] LA A4C Area:nm* [ < 20] LA Volume:nm* [22 - 52]  ___________________________________________________________________________________________ ECHOCARDIOGRAPHIC DESCRIPTIONS  AORTIC ROOT Size:Normal Dissection:INDETERM FOR DISSECTION  AORTIC VALVE Leaflets:Tricuspid Morphology:MILDLY THICKENED Mobility:Fully mobile  LEFT  VENTRICLE Size:NormalAnterior:Normal  Contraction:Normal Lateral:Normal Closest EF:>55% (Estimated)Septal:Normal  LV Masses:No Masses Apical:Normal  MWN:UUVOZDGU LVHInferior:Normal Posterior:Normal Dias.FxClass:N/A  MITRAL VALVE Leaflets:NormalMobility:Fully mobile Morphology:Normal  LEFT ATRIUM Size:Normal LA Masses:No masses  IA Septum:Normal IAS  MAIN PA Size:Normal  PULMONIC VALVE Morphology:NormalMobility:Fully mobile  RIGHT VENTRICLE  RV Masses:No Masses Size:Normal  Free Wall:Normal Contraction:Normal  TRICUSPID VALVE Leaflets:NormalMobility:Fully mobile Morphology:Normal  RIGHT ATRIUM Size:NormalRA Other:None  RA Mass:No masses  PERICARDIUM  Fluid:No effusion  INFERIOR VENACAVA Size:Normal Normal respiratory collapse   ____________________________________________________________________ DOPPLER ECHO and OTHER SPECIAL PROCEDURES  Aortic:No AR No AS 109.0 cm/sec peak vel 4.8 mmHg peak grad   Mitral:MILD MR No MS 4.4 cm^2 by DOPPLER MV Inflow E Vel=50.3 cm/sec MV Annulus E'Vel=6.1 cm/sec E/E'Ratio=8.2  Tricuspid:MILD TR No TS 239.0 cm/sec peak TR vel27.9 mmHg peak RV pressure  Pulmonary:MILD PR No PS 95.2 cm/sec peak vel3.6 mmHg peak grad     ___________________________________________________________________________________________ INTERPRETATION NORMAL LEFT VENTRICULAR SYSTOLIC FUNCTION WITH MODERATE LVH MILD VALVULAR REGURGITATION (See above) NO VALVULAR STENOSIS EF  >55%   ___________________________________________________________________________________________ Electronically signed by: MD Serafina Royals on 01/29/2016 09:49 AM Performed By: Roar,  Jonette Mate, RDCS Ordering Physician: Serafina Royals ___________________________________________________________________________________________  Status Results Details   Unavailable

## 2017-12-08 NOTE — H&P (Signed)
PREOPERATIVE HISTORY AND PHYSICAL  Date of surgery: 12/13/2017 Diagnosis: 1.  Incomplete uterine prolapse 2.  Second-degree cystocele Procedure: TVH BSO with anterior colporrhaphy   Sabrina [name] is a 72 y.o. [id number] scheduled for surgery on 12/13/2017.  Sabrina [name] is to undergo TVH BSO with anterior colporrhaphy for management of uterine prolapse and second-degree cystocele.  The Sabrina [name] is a 72 year old African-American female para 3013, menopausal, on no hormone replacement therapy, presents for evaluation and management of uterine prolapse. Symptom onset was approximately 5-6 years ago.  She developed a bulge/mass protruding from the vagina.  Symptoms have progressively worsened and she desires surgical intervention if possible.  She is not interested in pessary management. Sabrina [name] was seen [facility] OB/GYN department and was referred to urogynecology for management; Sabrina [name] did not keep that follow-up because her insurance was not accepted by the tertiary care facility.  GU symptoms: Urinary frequency-5 times per day Nocturia-none Stress urinary incontinence-no loss of urine with coughing sneezing laughing or lifting; she does not need to wear protection because of incontinence. Urge symptoms-negative Sabrina [name] is not sexually active.  GI symptoms: Bowel movement frequency 1-2/day No reported pain with bowel movements; Sabrina [name] does not need to splint and does not have chronic constipation.    Menarche-age 70 Menopause-age 80 No history of HRT use No history of STDs Para 3013, NSVD x3  OB History    Gravida Para Term Preterm AB Living   5 3 3   2 3    SAB TAB Ectopic Multiple Live Births   1 1     3       No LMP recorded. Sabrina [name] is postmenopausal.    Past Medical History:  Diagnosis Date  . Diabetes mellitus without complication ([place])   . High cholesterol   . Hypertension     Past Surgical History:  Procedure Laterality Date  . POLYPECTOMY      OB History   Gravida Para Term Preterm AB Living  5 3 3   2 3   SAB TAB Ectopic Multiple Live Births  1 1     3     # Outcome Date GA Lbr Len/2nd Weight Sex Delivery Anes PTL Lv  5 SAB           4 TAB           3 Term      Vag-Spont   LIV  2 Term      Vag-Spont   LIV  1 Term      Vag-Spont   LIV      Social History   Socioeconomic History  . Marital status: Divorced    Spouse name: None  . Number of children: None  . Years of education: None  . Highest education level: None  Social Needs  . Financial resource strain: None  . Food insecurity - worry: None  . Food insecurity - inability: None  . Transportation needs - medical: None  . Transportation needs - non-medical: None  Occupational History  . Occupation: retired  Tobacco Use  . Smoking status: Never Smoker  . Smokeless tobacco: Never Used  Substance and Sexual Activity  . Alcohol use: No  . Drug use: No  . Sexual activity: Not Currently    Birth control/protection: Post-menopausal  Other Topics Concern  . None  Social History Narrative  . None    Family History  Problem Relation Age of Onset  . Breast cancer Maternal Aunt        2 aunts in their 63's  .  Breast cancer Maternal Aunt 70  . Ovarian cancer Neg Hx   . Colon cancer Neg Hx      (Not in a hospital admission)  Allergies  Allergen Reactions  . Glimepiride Diarrhea and Other (See Comments)  . Glipizide Other (See Comments)  . Lisinopril Other (See Comments)    headache  . Losartan Other (See Comments)    Headache  . Norco [Hydrocodone-Acetaminophen] Other (See Comments)    Mood changes  . Pioglitazone Swelling    On legs    Review of Systems Constitutional: No recent fever/chills/sweats Respiratory: No recent cough/bronchitis Cardiovascular: No chest pain Gastrointestinal: No recent nausea/vomiting/diarrhea Genitourinary: No UTI symptoms Hematologic/lymphatic:No history of coagulopathy or recent blood thinner use    Objective:    BP (!)  167/76   Pulse 71   Ht 5\' 4"  (1.626 m)   Wt 165 lb (74.8 kg)   BMI 28.32 kg/m   General:   Normal  Skin:   normal  HEENT:  Normal  Neck:  Supple without Adenopathy or Thyromegaly  Lungs:   Heart:              Breasts:   Abdomen:  Pelvis:  M/S   Extremeties:  Neuro:    clear to auscultation bilaterally   Normal without murmur   Not Examined   soft, non-tender; bowel sounds normal; no masses,  no organomegaly   Exam deferred to OR  No CVAT  Warm/Dry   Normal        09/09/2017 PELVIC:             External Genitalia: Vulvar hyperemia and epithelial thickening with satellite lesions, consistent with chronic Monilia             BUS: Normal             Vagina: Mild atrophic changes; second to third-degree cystocele, mild rectocele             Cervix: Normal; no lesions; no cervical motion tenderness; prolapsed to the introitus             Uterus: Normal size, shape,consistency, mobile, prolapsed to the introitus             Adnexa: Normal; nonpalpable nontender             RV: Normal external exam             Bladder: Nontender    Assessment:    1.  Uterine prolapse, incomplete 2.  Second to third-degree cystocele   Plan:  TVH BSO with anterior colporrhaphy   Preop counseling: The Sabrina [name] is to undergo TVH BSO with anterior colporrhaphy on 12/13/2017.  She is understanding of the planned procedure and is aware of and is accepting of all surgical risks which include but are not limited to bleeding, infection, pelvic organ injury with need for repair, blood clot disorders, anesthesia risk, etc.  All questions have been answered.  Informed consent is given.  Sabrina [name] is ready willing to proceed with surgery as scheduled.  Brayton Mars, MD  Note: This dictation was prepared with Dragon dictation along with smaller phrase technology. Any transcriptional errors that result from this process are unintentional.

## 2017-12-08 NOTE — Patient Instructions (Addendum)
Your procedure is scheduled on: 12/13/17 Mon Report to Same Day Surgery 2nd floor medical mall [facility] Entrance-take elevator on left to 2nd floor.  Check in with surgery information desk.) To find out your arrival time please call [phone] between 1PM - 3PM on 12/10/17 Fri  Remember: Instructions that are not followed completely may result in serious medical risk, up to and including death, or upon the discretion of your surgeon and anesthesiologist your surgery may need to be rescheduled.    _x___ 1. Do not eat food after midnight the night before your procedure. You may drink clear liquids up to 2 hours before you are scheduled to arrive at the hospital for your procedure.  Do not drink clear liquids within 2 hours of your scheduled arrival to the hospital.  Clear liquids include  --Water or Apple juice without pulp  --Clear carbohydrate beverage such as ClearFast or Gatorade  --Black Coffee or Clear Tea (No milk, no creamers, do not add anything to                  the coffee or Tea Type 1 and type 2 diabetics should only drink water.  No gum chewing or hard candies.     __x__ 2. No Alcohol for 24 hours before or after surgery.   __x__3. No Smoking or e-cigarettes for 24 prior to surgery.  Do not use any chewable tobacco products for at least 6 hour prior to surgery   ____  4. Bring all medications with you on the day of surgery if instructed.    __x__ 5. Notify your doctor if there is any change in your medical condition     (cold, fever, infections).    x___6. On the morning of surgery brush your teeth with toothpaste and water.  You may rinse your mouth with mouth wash if you wish.  Do not swallow any toothpaste or mouthwash.   Do not wear jewelry, make-up, hairpins, clips or nail polish.  Do not wear lotions, powders, or perfumes. You may wear deodorant.  Do not shave 48 hours prior to surgery. Men may shave face and neck.  Do not bring valuables to the hospital.     [facility] is not responsible for any belongings or valuables.               Contacts, dentures or bridgework may not be worn into surgery.  Leave your suitcase in the car. After surgery it may be brought to your room.  For patients admitted to the hospital, discharge time is determined by your                       treatment team.  _  Patients discharged the day of surgery will not be allowed to drive home.  You will need someone to drive you home and stay with you the night of your procedure.    Please read over the following fact sheets that you were given:   [facility] Preparing for Surgery and or MRSA Information   _x___ Take anti-hypertensive listed below, cardiac, seizure, asthma,     anti-reflux and psychiatric medicines. These include:  1. bisoprolol (ZEBETA) 5 MG tablet  2.  3.  4.  5.  6.  ____Fleets enema or Magnesium Citrate as directed.   _x___ Use CHG Soap or sage wipes as directed on instruction sheet   ____ Use inhalers on the day of surgery and bring to hospital  day of surgery  _x___ Stop Metformin and Janumet 2 days prior to surgery.    ____ Take 1/2 of usual insulin dose the night before surgery and none on the morning     surgery.   _x___ Follow recommendations from Cardiologist, Pulmonologist or PCP regarding          stopping Aspirin, Coumadin, Plavix ,Eliquis, Effient, or Pradaxa, and Pletal. Stop Aspirin tomorrow if OK with physician   X____Stop Anti-inflammatories such as Advil, Aleve, Ibuprofen, Motrin, Naproxen, Naprosyn, Goodies powders or aspirin products. OK to take Tylenol and                          Celebrex.   _x___ Stop supplements until after surgery.  But may continue Vitamin D, Vitamin B,       and multivitamin.   ____ Bring C-Pap to the hospital.

## 2017-12-08 NOTE — Patient Instructions (Signed)
1.  Return for postop check on 12/23/2017

## 2017-12-09 LAB — RPR: RPR Ser Ql: NONREACTIVE

## 2017-12-13 ENCOUNTER — Encounter: Admission: RE | Payer: Self-pay | Source: Ambulatory Visit

## 2017-12-13 ENCOUNTER — Ambulatory Visit
Admission: RE | Admit: 2017-12-13 | Payer: Medicare HMO | Source: Ambulatory Visit | Admitting: Obstetrics and Gynecology

## 2017-12-13 SURGERY — HYSTERECTOMY, VAGINAL
Anesthesia: General

## 2017-12-27 ENCOUNTER — Encounter: Payer: Self-pay | Admitting: Obstetrics and Gynecology

## 2018-03-15 DIAGNOSIS — E1122 Type 2 diabetes mellitus with diabetic chronic kidney disease: Secondary | ICD-10-CM | POA: Diagnosis not present

## 2018-03-15 DIAGNOSIS — I10 Essential (primary) hypertension: Secondary | ICD-10-CM | POA: Diagnosis not present

## 2018-03-15 DIAGNOSIS — Z1231 Encounter for screening mammogram for malignant neoplasm of breast: Secondary | ICD-10-CM | POA: Diagnosis not present

## 2018-03-15 DIAGNOSIS — E7800 Pure hypercholesterolemia, unspecified: Secondary | ICD-10-CM | POA: Diagnosis not present

## 2018-03-15 DIAGNOSIS — E119 Type 2 diabetes mellitus without complications: Secondary | ICD-10-CM | POA: Diagnosis not present

## 2018-03-15 DIAGNOSIS — Z794 Long term (current) use of insulin: Secondary | ICD-10-CM | POA: Diagnosis not present

## 2018-03-15 DIAGNOSIS — Z79899 Other long term (current) drug therapy: Secondary | ICD-10-CM | POA: Diagnosis not present

## 2018-03-15 DIAGNOSIS — N183 Chronic kidney disease, stage 3 (moderate): Secondary | ICD-10-CM | POA: Diagnosis not present

## 2018-04-26 ENCOUNTER — Other Ambulatory Visit: Payer: Self-pay | Admitting: Internal Medicine

## 2018-04-26 DIAGNOSIS — Z1231 Encounter for screening mammogram for malignant neoplasm of breast: Secondary | ICD-10-CM

## 2018-05-06 ENCOUNTER — Other Ambulatory Visit: Payer: Self-pay | Admitting: Pharmacist

## 2018-05-06 NOTE — Patient Outreach (Signed)
[facility]) Care Management  05/06/2018  ARYAHI DENZLER Apr 12, 1946 [id number]   Incoming call from Theresia Lo in response to the EMMI Medication Adherence Campaign. Speak with patient. HIPAA identifiers verified and verbal consent received.  Ms. Finck reports that she takes her metformin 500 mg - 2 tablets twice daily as directed. Denies any missed doses or barriers to adherence. Counsel patient on the importance of medication adherence. Counsel patient about the use of medication adherence tools, such as a pillbox to aid with adherence. Patient verbalizes understanding and states that she has a couple of weekly pillboxes at home. States that she will start using a weekly pillbox on Sunday to give this strategy a try to see if she is missing any doses.  Ms. Anwar denies any further medication questions/concerns at this time. Patient expresses appreciation for this call.  Will close pharmacy episode at this time.  Harlow Asa, PharmD, [facility] [phone]

## 2018-05-25 ENCOUNTER — Ambulatory Visit
Admission: RE | Admit: 2018-05-25 | Discharge: 2018-05-25 | Disposition: A | Discharge status: Home / Self Care | Payer: Medicare HMO | Source: Ambulatory Visit | Attending: Internal Medicine | Admitting: Internal Medicine

## 2018-05-25 DIAGNOSIS — Z1231 Encounter for screening mammogram for malignant neoplasm of breast: Secondary | ICD-10-CM | POA: Insufficient documentation

## 2018-06-17 DIAGNOSIS — I10 Essential (primary) hypertension: Secondary | ICD-10-CM | POA: Diagnosis not present

## 2018-06-17 DIAGNOSIS — Z79899 Other long term (current) drug therapy: Secondary | ICD-10-CM | POA: Diagnosis not present

## 2018-06-17 DIAGNOSIS — E119 Type 2 diabetes mellitus without complications: Secondary | ICD-10-CM | POA: Diagnosis not present

## 2018-06-17 DIAGNOSIS — E7800 Pure hypercholesterolemia, unspecified: Secondary | ICD-10-CM | POA: Diagnosis not present

## 2018-06-17 DIAGNOSIS — R8290 Unspecified abnormal findings in urine: Secondary | ICD-10-CM | POA: Diagnosis not present

## 2018-06-24 DIAGNOSIS — M1811 Unilateral primary osteoarthritis of first carpometacarpal joint, right hand: Secondary | ICD-10-CM | POA: Diagnosis not present

## 2018-06-24 DIAGNOSIS — M79641 Pain in right hand: Secondary | ICD-10-CM | POA: Diagnosis not present

## 2018-06-24 DIAGNOSIS — E1165 Type 2 diabetes mellitus with hyperglycemia: Secondary | ICD-10-CM | POA: Diagnosis not present

## 2018-06-24 DIAGNOSIS — M1812 Unilateral primary osteoarthritis of first carpometacarpal joint, left hand: Secondary | ICD-10-CM | POA: Diagnosis not present

## 2018-06-24 DIAGNOSIS — I10 Essential (primary) hypertension: Secondary | ICD-10-CM | POA: Diagnosis not present

## 2018-06-24 DIAGNOSIS — Z79899 Other long term (current) drug therapy: Secondary | ICD-10-CM | POA: Diagnosis not present

## 2018-06-24 DIAGNOSIS — E118 Type 2 diabetes mellitus with unspecified complications: Secondary | ICD-10-CM | POA: Diagnosis not present

## 2018-06-24 DIAGNOSIS — M79642 Pain in left hand: Secondary | ICD-10-CM | POA: Diagnosis not present

## 2018-06-24 DIAGNOSIS — E7800 Pure hypercholesterolemia, unspecified: Secondary | ICD-10-CM | POA: Diagnosis not present

## 2018-09-29 DIAGNOSIS — E118 Type 2 diabetes mellitus with unspecified complications: Secondary | ICD-10-CM | POA: Diagnosis not present

## 2018-09-29 DIAGNOSIS — E7800 Pure hypercholesterolemia, unspecified: Secondary | ICD-10-CM | POA: Diagnosis not present

## 2018-09-29 DIAGNOSIS — E119 Type 2 diabetes mellitus without complications: Secondary | ICD-10-CM | POA: Diagnosis not present

## 2018-09-29 DIAGNOSIS — I259 Chronic ischemic heart disease, unspecified: Secondary | ICD-10-CM | POA: Diagnosis not present

## 2018-09-29 DIAGNOSIS — E1165 Type 2 diabetes mellitus with hyperglycemia: Secondary | ICD-10-CM | POA: Diagnosis not present

## 2018-09-29 DIAGNOSIS — Z79899 Other long term (current) drug therapy: Secondary | ICD-10-CM | POA: Diagnosis not present

## 2018-09-29 DIAGNOSIS — I10 Essential (primary) hypertension: Secondary | ICD-10-CM | POA: Diagnosis not present

## 2018-12-22 ENCOUNTER — Telehealth: Payer: Self-pay | Admitting: Obstetrics and Gynecology

## 2018-12-22 ENCOUNTER — Telehealth: Payer: Self-pay

## 2018-12-22 NOTE — Telephone Encounter (Signed)
Pt called wanting to know why her surgery from 11/2017 was cx.  DJE reviewed chart and it at the time of surgery her BS was 374. He is confident that was the reason her surgery was cx,   [facility].

## 2018-12-22 NOTE — Telephone Encounter (Signed)
The patient called and stated that she missed a call from [place] and would like a call back. The patient also stated that she will not be available until after 3 pm tomorrow, The patient also stated that if she misses the call again to please put the information in the mail. Please advise.

## 2018-12-23 NOTE — Telephone Encounter (Signed)
Pt aware surgery cancelled d/t elevated glucose level.  Per pt request letter mailed for her records.

## 2019-04-21 DIAGNOSIS — E113212 Type 2 diabetes mellitus with mild nonproliferative diabetic retinopathy with macular edema, left eye: Secondary | ICD-10-CM | POA: Diagnosis not present

## 2019-04-21 DIAGNOSIS — H2513 Age-related nuclear cataract, bilateral: Secondary | ICD-10-CM | POA: Diagnosis not present

## 2019-04-21 DIAGNOSIS — E113291 Type 2 diabetes mellitus with mild nonproliferative diabetic retinopathy without macular edema, right eye: Secondary | ICD-10-CM | POA: Diagnosis not present

## 2019-05-02 DIAGNOSIS — E1165 Type 2 diabetes mellitus with hyperglycemia: Secondary | ICD-10-CM | POA: Diagnosis not present

## 2019-05-02 DIAGNOSIS — Z1239 Encounter for other screening for malignant neoplasm of breast: Secondary | ICD-10-CM | POA: Diagnosis not present

## 2019-05-02 DIAGNOSIS — E118 Type 2 diabetes mellitus with unspecified complications: Secondary | ICD-10-CM | POA: Diagnosis not present

## 2019-05-02 DIAGNOSIS — I10 Essential (primary) hypertension: Secondary | ICD-10-CM | POA: Diagnosis not present

## 2019-05-02 DIAGNOSIS — E7800 Pure hypercholesterolemia, unspecified: Secondary | ICD-10-CM | POA: Diagnosis not present

## 2019-05-02 DIAGNOSIS — R140 Abdominal distension (gaseous): Secondary | ICD-10-CM | POA: Diagnosis not present

## 2019-05-02 DIAGNOSIS — E538 Deficiency of other specified B group vitamins: Secondary | ICD-10-CM | POA: Diagnosis not present

## 2019-05-02 DIAGNOSIS — Z79899 Other long term (current) drug therapy: Secondary | ICD-10-CM | POA: Diagnosis not present

## 2019-05-03 ENCOUNTER — Other Ambulatory Visit: Payer: Self-pay | Admitting: Internal Medicine

## 2019-05-03 DIAGNOSIS — Z1231 Encounter for screening mammogram for malignant neoplasm of breast: Secondary | ICD-10-CM

## 2019-05-15 DIAGNOSIS — E113212 Type 2 diabetes mellitus with mild nonproliferative diabetic retinopathy with macular edema, left eye: Secondary | ICD-10-CM | POA: Insufficient documentation

## 2019-05-16 ENCOUNTER — Other Ambulatory Visit: Payer: Self-pay | Admitting: Nurse Practitioner

## 2019-05-16 DIAGNOSIS — R1013 Epigastric pain: Secondary | ICD-10-CM | POA: Insufficient documentation

## 2019-05-16 DIAGNOSIS — Z85040 Personal history of malignant carcinoid tumor of rectum: Secondary | ICD-10-CM | POA: Diagnosis not present

## 2019-05-16 DIAGNOSIS — R140 Abdominal distension (gaseous): Secondary | ICD-10-CM | POA: Insufficient documentation

## 2019-05-19 DIAGNOSIS — H2513 Age-related nuclear cataract, bilateral: Secondary | ICD-10-CM | POA: Diagnosis not present

## 2019-05-19 DIAGNOSIS — E113291 Type 2 diabetes mellitus with mild nonproliferative diabetic retinopathy without macular edema, right eye: Secondary | ICD-10-CM | POA: Diagnosis not present

## 2019-05-19 DIAGNOSIS — E113212 Type 2 diabetes mellitus with mild nonproliferative diabetic retinopathy with macular edema, left eye: Secondary | ICD-10-CM | POA: Diagnosis not present

## 2019-06-09 ENCOUNTER — Ambulatory Visit
Admission: RE | Admit: 2019-06-09 | Discharge: 2019-06-09 | Disposition: A | Discharge status: Home / Self Care | Payer: Medicare HMO | Source: Ambulatory Visit | Attending: Internal Medicine | Admitting: Internal Medicine

## 2019-06-09 ENCOUNTER — Other Ambulatory Visit: Payer: Self-pay

## 2019-06-09 DIAGNOSIS — Z1231 Encounter for screening mammogram for malignant neoplasm of breast: Secondary | ICD-10-CM | POA: Diagnosis not present

## 2019-06-15 ENCOUNTER — Ambulatory Visit: Admission: RE | Admit: 2019-06-15 | Payer: Medicare HMO | Source: Ambulatory Visit

## 2019-06-16 ENCOUNTER — Ambulatory Visit
Admission: RE | Admit: 2019-06-16 | Discharge: 2019-06-16 | Disposition: A | Discharge status: Home / Self Care | Payer: Medicare HMO | Source: Ambulatory Visit | Attending: Nurse Practitioner | Admitting: Nurse Practitioner

## 2019-06-16 ENCOUNTER — Other Ambulatory Visit: Payer: Self-pay

## 2019-06-16 DIAGNOSIS — R197 Diarrhea, unspecified: Secondary | ICD-10-CM | POA: Diagnosis not present

## 2019-06-16 DIAGNOSIS — R1013 Epigastric pain: Secondary | ICD-10-CM | POA: Diagnosis not present

## 2019-06-16 DIAGNOSIS — Z85040 Personal history of malignant carcinoid tumor of rectum: Secondary | ICD-10-CM | POA: Diagnosis not present

## 2019-06-16 LAB — POCT I-STAT CREATININE: Creatinine, Ser: 0.7 mg/dL (ref 0.44–1.00)

## 2019-06-16 MED ORDER — IOHEXOL 300 MG/ML  SOLN
100.0000 mL | Freq: Once | INTRAMUSCULAR | Status: AC | PRN
  Administered 2019-06-16: 100 mL via INTRAVENOUS

## 2019-06-27 DIAGNOSIS — R9389 Abnormal findings on diagnostic imaging of other specified body structures: Secondary | ICD-10-CM | POA: Diagnosis not present

## 2019-06-27 DIAGNOSIS — R300 Dysuria: Secondary | ICD-10-CM | POA: Diagnosis not present

## 2019-06-27 DIAGNOSIS — Z8659 Personal history of other mental and behavioral disorders: Secondary | ICD-10-CM | POA: Diagnosis not present

## 2019-06-27 DIAGNOSIS — R140 Abdominal distension (gaseous): Secondary | ICD-10-CM | POA: Diagnosis not present

## 2019-06-27 DIAGNOSIS — R1013 Epigastric pain: Secondary | ICD-10-CM | POA: Diagnosis not present

## 2019-06-27 DIAGNOSIS — K760 Fatty (change of) liver, not elsewhere classified: Secondary | ICD-10-CM | POA: Diagnosis not present

## 2019-07-18 DIAGNOSIS — H18413 Arcus senilis, bilateral: Secondary | ICD-10-CM | POA: Diagnosis not present

## 2019-07-18 DIAGNOSIS — E113293 Type 2 diabetes mellitus with mild nonproliferative diabetic retinopathy without macular edema, bilateral: Secondary | ICD-10-CM | POA: Diagnosis not present

## 2019-07-18 DIAGNOSIS — H25013 Cortical age-related cataract, bilateral: Secondary | ICD-10-CM | POA: Diagnosis not present

## 2019-07-18 DIAGNOSIS — H2511 Age-related nuclear cataract, right eye: Secondary | ICD-10-CM | POA: Diagnosis not present

## 2019-07-18 DIAGNOSIS — H25043 Posterior subcapsular polar age-related cataract, bilateral: Secondary | ICD-10-CM | POA: Diagnosis not present

## 2019-07-18 DIAGNOSIS — H2513 Age-related nuclear cataract, bilateral: Secondary | ICD-10-CM | POA: Diagnosis not present

## 2019-07-19 DIAGNOSIS — I10 Essential (primary) hypertension: Secondary | ICD-10-CM | POA: Diagnosis not present

## 2019-07-19 DIAGNOSIS — Z79899 Other long term (current) drug therapy: Secondary | ICD-10-CM | POA: Diagnosis not present

## 2019-07-19 DIAGNOSIS — Z0000 Encounter for general adult medical examination without abnormal findings: Secondary | ICD-10-CM | POA: Diagnosis not present

## 2019-07-19 DIAGNOSIS — E113212 Type 2 diabetes mellitus with mild nonproliferative diabetic retinopathy with macular edema, left eye: Secondary | ICD-10-CM | POA: Diagnosis not present

## 2019-07-19 DIAGNOSIS — R109 Unspecified abdominal pain: Secondary | ICD-10-CM | POA: Diagnosis not present

## 2019-07-19 DIAGNOSIS — F22 Delusional disorders: Secondary | ICD-10-CM | POA: Diagnosis not present

## 2019-07-19 DIAGNOSIS — E7800 Pure hypercholesterolemia, unspecified: Secondary | ICD-10-CM | POA: Diagnosis not present

## 2019-07-31 DIAGNOSIS — H2511 Age-related nuclear cataract, right eye: Secondary | ICD-10-CM | POA: Diagnosis not present

## 2019-08-01 DIAGNOSIS — H2512 Age-related nuclear cataract, left eye: Secondary | ICD-10-CM | POA: Diagnosis not present

## 2019-08-18 DIAGNOSIS — E113212 Type 2 diabetes mellitus with mild nonproliferative diabetic retinopathy with macular edema, left eye: Secondary | ICD-10-CM | POA: Diagnosis not present

## 2019-08-18 DIAGNOSIS — Z79899 Other long term (current) drug therapy: Secondary | ICD-10-CM | POA: Diagnosis not present

## 2019-08-18 DIAGNOSIS — I10 Essential (primary) hypertension: Secondary | ICD-10-CM | POA: Diagnosis not present

## 2019-08-18 DIAGNOSIS — E7800 Pure hypercholesterolemia, unspecified: Secondary | ICD-10-CM | POA: Diagnosis not present

## 2019-08-21 DIAGNOSIS — H2512 Age-related nuclear cataract, left eye: Secondary | ICD-10-CM | POA: Diagnosis not present

## 2020-01-17 DIAGNOSIS — E538 Deficiency of other specified B group vitamins: Secondary | ICD-10-CM | POA: Diagnosis not present

## 2020-01-17 DIAGNOSIS — E7800 Pure hypercholesterolemia, unspecified: Secondary | ICD-10-CM | POA: Diagnosis not present

## 2020-01-17 DIAGNOSIS — I10 Essential (primary) hypertension: Secondary | ICD-10-CM | POA: Diagnosis not present

## 2020-01-17 DIAGNOSIS — E118 Type 2 diabetes mellitus with unspecified complications: Secondary | ICD-10-CM | POA: Diagnosis not present

## 2020-01-17 DIAGNOSIS — Z79899 Other long term (current) drug therapy: Secondary | ICD-10-CM | POA: Diagnosis not present

## 2020-01-24 ENCOUNTER — Other Ambulatory Visit: Payer: Self-pay | Admitting: Internal Medicine

## 2020-01-24 DIAGNOSIS — Z1231 Encounter for screening mammogram for malignant neoplasm of breast: Secondary | ICD-10-CM

## 2020-01-30 ENCOUNTER — Other Ambulatory Visit: Payer: Self-pay

## 2020-01-30 ENCOUNTER — Observation Stay: Payer: Medicare HMO

## 2020-01-30 ENCOUNTER — Emergency Department: Payer: Medicare HMO

## 2020-01-30 ENCOUNTER — Inpatient Hospital Stay
Admission: EM | Admit: 2020-01-30 | Discharge: 2020-02-01 | DRG: 065 | Disposition: A | Discharge status: Home / Self Care | Payer: Medicare HMO | Attending: Family Medicine | Admitting: Family Medicine

## 2020-01-30 DIAGNOSIS — F419 Anxiety disorder, unspecified: Secondary | ICD-10-CM | POA: Diagnosis not present

## 2020-01-30 DIAGNOSIS — R2990 Unspecified symptoms and signs involving the nervous system: Secondary | ICD-10-CM

## 2020-01-30 DIAGNOSIS — R29702 NIHSS score 2: Secondary | ICD-10-CM | POA: Diagnosis not present

## 2020-01-30 DIAGNOSIS — R6813 Apparent life threatening event in infant (ALTE): Secondary | ICD-10-CM

## 2020-01-30 DIAGNOSIS — I10 Essential (primary) hypertension: Secondary | ICD-10-CM | POA: Diagnosis not present

## 2020-01-30 DIAGNOSIS — R4781 Slurred speech: Secondary | ICD-10-CM | POA: Diagnosis present

## 2020-01-30 DIAGNOSIS — R29818 Other symptoms and signs involving the nervous system: Secondary | ICD-10-CM

## 2020-01-30 DIAGNOSIS — I6381 Other cerebral infarction due to occlusion or stenosis of small artery: Secondary | ICD-10-CM | POA: Diagnosis not present

## 2020-01-30 DIAGNOSIS — Z7982 Long term (current) use of aspirin: Secondary | ICD-10-CM

## 2020-01-30 DIAGNOSIS — R739 Hyperglycemia, unspecified: Secondary | ICD-10-CM

## 2020-01-30 DIAGNOSIS — E785 Hyperlipidemia, unspecified: Secondary | ICD-10-CM | POA: Diagnosis present

## 2020-01-30 DIAGNOSIS — Z888 Allergy status to other drugs, medicaments and biological substances status: Secondary | ICD-10-CM

## 2020-01-30 DIAGNOSIS — I340 Nonrheumatic mitral (valve) insufficiency: Secondary | ICD-10-CM | POA: Diagnosis not present

## 2020-01-30 DIAGNOSIS — Z79899 Other long term (current) drug therapy: Secondary | ICD-10-CM

## 2020-01-30 DIAGNOSIS — T383X5A Adverse effect of insulin and oral hypoglycemic [antidiabetic] drugs, initial encounter: Secondary | ICD-10-CM | POA: Diagnosis present

## 2020-01-30 DIAGNOSIS — Z7984 Long term (current) use of oral hypoglycemic drugs: Secondary | ICD-10-CM

## 2020-01-30 DIAGNOSIS — R9082 White matter disease, unspecified: Secondary | ICD-10-CM | POA: Diagnosis not present

## 2020-01-30 DIAGNOSIS — F3189 Other bipolar disorder: Secondary | ICD-10-CM | POA: Diagnosis present

## 2020-01-30 DIAGNOSIS — I639 Cerebral infarction, unspecified: Secondary | ICD-10-CM | POA: Diagnosis not present

## 2020-01-30 DIAGNOSIS — Z803 Family history of malignant neoplasm of breast: Secondary | ICD-10-CM | POA: Diagnosis not present

## 2020-01-30 DIAGNOSIS — E1165 Type 2 diabetes mellitus with hyperglycemia: Secondary | ICD-10-CM | POA: Diagnosis not present

## 2020-01-30 DIAGNOSIS — R531 Weakness: Secondary | ICD-10-CM | POA: Diagnosis present

## 2020-01-30 DIAGNOSIS — R4701 Aphasia: Secondary | ICD-10-CM | POA: Diagnosis not present

## 2020-01-30 DIAGNOSIS — K529 Noninfective gastroenteritis and colitis, unspecified: Secondary | ICD-10-CM | POA: Diagnosis present

## 2020-01-30 DIAGNOSIS — R4182 Altered mental status, unspecified: Secondary | ICD-10-CM | POA: Diagnosis present

## 2020-01-30 DIAGNOSIS — I6523 Occlusion and stenosis of bilateral carotid arteries: Secondary | ICD-10-CM | POA: Diagnosis not present

## 2020-01-30 LAB — CBC
HCT: 38.5 % (ref 36.0–46.0)
Hemoglobin: 12.8 g/dL (ref 12.0–15.0)
MCH: 29.6 pg (ref 26.0–34.0)
MCHC: 33.2 g/dL (ref 30.0–36.0)
MCV: 89.1 fL (ref 80.0–100.0)
Platelets: 337 10*3/uL (ref 150–400)
RBC: 4.32 MIL/uL (ref 3.87–5.11)
RDW: 12.7 % (ref 11.5–15.5)
WBC: 6.7 10*3/uL (ref 4.0–10.5)
nRBC: 0 % (ref 0.0–0.2)

## 2020-01-30 LAB — COMPREHENSIVE METABOLIC PANEL
ALT: 21 U/L (ref 0–44)
AST: 23 U/L (ref 15–41)
Albumin: 4.1 g/dL (ref 3.5–5.0)
Alkaline Phosphatase: 72 U/L (ref 38–126)
Anion gap: 13 (ref 5–15)
BUN: 21 mg/dL (ref 8–23)
CO2: 23 mmol/L (ref 22–32)
Calcium: 9.6 mg/dL (ref 8.9–10.3)
Chloride: 101 mmol/L (ref 98–111)
Creatinine, Ser: 0.99 mg/dL (ref 0.44–1.00)
GFR calc Af Amer: >60 mL/min (ref >60)
GFR calc non Af Amer: 57 mL/min — ABNORMAL LOW (ref >60)
Glucose, Bld: 374 mg/dL — ABNORMAL HIGH (ref 70–99)
Potassium: 4.1 mmol/L (ref 3.5–5.1)
Sodium: 137 mmol/L (ref 135–145)
Total Bilirubin: 0.6 mg/dL (ref 0.3–1.2)
Total Protein: 7.1 g/dL (ref 6.5–8.1)

## 2020-01-30 LAB — DIFFERENTIAL
Abs Immature Granulocytes: 0 10*3/uL (ref 0.00–0.07)
Basophils Absolute: 0 10*3/uL (ref 0.0–0.1)
Basophils Relative: 0 %
Eosinophils Absolute: 0.1 10*3/uL (ref 0.0–0.5)
Eosinophils Relative: 2 %
Immature Granulocytes: 0 %
Lymphocytes Relative: 50 %
Lymphs Abs: 3.4 10*3/uL (ref 0.7–4.0)
Monocytes Absolute: 0.4 10*3/uL (ref 0.1–1.0)
Monocytes Relative: 7 %
Neutro Abs: 2.7 10*3/uL (ref 1.7–7.7)
Neutrophils Relative %: 41 %

## 2020-01-30 LAB — PROTIME-INR
INR: 1 (ref 0.8–1.2)
Prothrombin Time: 12.8 seconds (ref 11.4–15.2)

## 2020-01-30 LAB — TROPONIN I (HIGH SENSITIVITY)
Troponin I (High Sensitivity): 20 ng/L — ABNORMAL HIGH (ref <18)
Troponin I (High Sensitivity): 20 ng/L — ABNORMAL HIGH (ref <18)

## 2020-01-30 LAB — GLUCOSE, CAPILLARY
Glucose-Capillary: 347 mg/dL — ABNORMAL HIGH (ref 70–99)
Glucose-Capillary: 311 mg/dL — ABNORMAL HIGH (ref 70–99)

## 2020-01-30 LAB — APTT: aPTT: 27 seconds (ref 24–36)

## 2020-01-30 MED ORDER — INSULIN ASPART 100 UNIT/ML ~~LOC~~ SOLN
0.0000 [IU] | Freq: Three times a day (TID) | SUBCUTANEOUS | Status: DC
  Administered 2020-01-31: 08:00:00 5 [IU] via SUBCUTANEOUS
  Administered 2020-01-31 (×2): 3 [IU] via SUBCUTANEOUS
  Administered 2020-02-01 (×2): 5 [IU] via SUBCUTANEOUS
  Filled 2020-01-30 (×5): qty 1

## 2020-01-30 MED ORDER — ENOXAPARIN SODIUM 40 MG/0.4ML ~~LOC~~ SOLN
40.0000 mg | SUBCUTANEOUS | Status: DC
  Administered 2020-01-31 (×2): 40 mg via SUBCUTANEOUS
  Filled 2020-01-30 (×2): qty 0.4

## 2020-01-30 MED ORDER — ACETAMINOPHEN 160 MG/5ML PO SOLN
650.0000 mg | ORAL | Status: DC | PRN
  Filled 2020-01-30: qty 20.3

## 2020-01-30 MED ORDER — STROKE: EARLY STAGES OF RECOVERY BOOK: Freq: Once | Status: DC

## 2020-01-30 MED ORDER — ACETAMINOPHEN 325 MG PO TABS: 650.0000 mg | ORAL_TABLET | ORAL | Status: DC | PRN

## 2020-01-30 MED ORDER — SODIUM CHLORIDE 0.9 % IV SOLN
75.0000 mL/h | INTRAVENOUS | Status: DC
  Administered 2020-01-31: 75 mL/h via INTRAVENOUS

## 2020-01-30 MED ORDER — SENNOSIDES-DOCUSATE SODIUM 8.6-50 MG PO TABS: 1.0000 | ORAL_TABLET | Freq: Every evening | ORAL | Status: DC | PRN

## 2020-01-30 MED ORDER — INSULIN ASPART 100 UNIT/ML ~~LOC~~ SOLN
0.0000 [IU] | Freq: Every day | SUBCUTANEOUS | Status: DC
  Administered 2020-01-31: 2 [IU] via SUBCUTANEOUS
  Filled 2020-01-30: qty 1

## 2020-01-30 MED ORDER — ASPIRIN 81 MG PO CHEW
324.0000 mg | CHEWABLE_TABLET | Freq: Once | ORAL | Status: AC
  Administered 2020-01-30: 324 mg via ORAL
  Filled 2020-01-30: qty 4

## 2020-01-30 MED ORDER — LORAZEPAM 2 MG/ML IJ SOLN: 1.0000 mg | INTRAMUSCULAR | Status: DC | PRN

## 2020-01-30 MED ORDER — INSULIN ASPART 100 UNIT/ML ~~LOC~~ SOLN
5.0000 [IU] | Freq: Once | SUBCUTANEOUS | Status: AC
  Administered 2020-01-30: 22:00:00 5 [IU] via INTRAVENOUS
  Filled 2020-01-30: qty 1

## 2020-01-30 MED ORDER — ACETAMINOPHEN 650 MG RE SUPP: 650.0000 mg | RECTAL | Status: DC | PRN

## 2020-01-30 NOTE — Progress Notes (Addendum)
CODE STROKE- PHARMACY COMMUNICATION   Time CODE STROKE called/page received:1853  Time response to CODE STROKE was made (in person or via phone): Pharmacy called several times between the hours of 1855  and 1911; pharmacy also messaged nurse via secure chat.   Time Stroke Kit retrieved from [facility] (only if needed):N/a  Name of Provider/Nurse: called stephen and asked pharmacy to bring tPA in case it was needed; this pharmacist brought tPA to RN.  Update @ 1952: tPA was not given  Rowland Lathe ,PharmD Clinical Pharmacist  01/30/2020  7:08 PM

## 2020-01-30 NOTE — ED Provider Notes (Signed)
Admission discussed with Dr. Arletta Bale, MD 01/30/20 2021

## 2020-01-30 NOTE — Consult Note (Signed)
[facility] Consult Services   Date of Service:   01/30/2020 19:15:11  Impression:     .  R41.82 - AMS (Altered Mental Status)  Comments/Sign-Out: 74 yo F h/o HTN, HL, DM, on asa p/w slurred speech and generalized weakness today, last known well 1730. Pt feels symptoms have improved now. Pt uses cane at baseline. She is able to give history, she arrived by private vehicle. No previous similar symptoms. EEG, MRI Br, seizure precautions  Metrics: Last Known Well: 01/30/2020 17:30:00 [facility] Notification Time: 01/30/2020 19:15:11 Arrival Time: 01/30/2020 19:23:22 Stamp Time: 01/30/2020 19:15:11 Time First Login Attempt: 01/30/2020 19:21:30 Symptoms: generalized weakness, slurred speech NIHSS Start Assessment Time: 01/30/2020 19:24:55 Patient is not a candidate for Alteplase/Activase. Alteplase Medical Decision: 01/30/2020 19:26:14 Patient was not deemed candidate for Alteplase/Activase thrombolytics because of Resolved symptoms (no residual disabling symptoms).  CT head was reviewed and results were: IMPRESSION: 1. Age indeterminate lacunar infarct in the right thalamus. Progressed cerebral white matter disease since 2017. 2. No acute cortically based infarct or acute intracranial hemorrhage identified. 3. ASPECTS 10.  Clinical Presentation is not Suggestive of Large Vessel Occlusive Disease  Sign Out: EEG, MRI Br, seizure precautions, neurochecks     .  Discussed with Emergency Department Provider    ------------------------------------------------------------------------------  History of Present Illness: Patient is a 74 year old Female.  Patient was brought by private transportation with symptoms of generalized weakness, slurred speech  74 yo F h/o HTN, HL, DM, on asa p/w slurred speech and generalized weakness today, last known well 1730. Pt feels symptoms have improved now. Pt uses cane at baseline. She is able to give history, she arrived by  private vehicle. No previous similar symptoms.   Past Medical History:     . Hypertension     . Diabetes Mellitus     . Hyperlipidemia  Anticoagulant use:  No  Antiplatelet use: aspirin    Examination: BP(135/62), Pulse(unknown), Blood Glucose(347) 1A: Level of Consciousness - Alert; keenly responsive + 0 1B: Ask Month and Age - Both Questions Right + 0 1C: Blink Eyes & Squeeze Hands - Performs Both Tasks + 0 2: Test Horizontal Extraocular Movements - Normal + 0 3: Test Visual Fields - No Visual Loss + 0 4: Test Facial Palsy (Use Grimace if Obtunded) - Normal symmetry + 0 5A: Test Left Arm Motor Drift - No Drift for 10 Seconds + 0 5B: Test Right Arm Motor Drift - No Drift for 10 Seconds + 0 6A: Test Left Leg Motor Drift - Drift, but doesn't hit bed + 1 6B: Test Right Leg Motor Drift - Drift, but doesn't hit bed + 1 7: Test Limb Ataxia (FNF/Heel-Shin) - No Ataxia + 0 8: Test Sensation - Normal; No sensory loss + 0 9: Test Language/Aphasia - Normal; No aphasia + 0 10: Test Dysarthria - Normal + 0 11: Test Extinction/Inattention - No abnormality + 0  NIHSS Score: 2  Pre-Morbid Modified Ranking Scale: 1 Points = No significant disability despite symptoms; able to carry out all usual duties and activities   Patient/Family was informed the Neurology Consult would occur via TeleHealth consult by way of interactive audio and video telecommunications and consented to receiving care in this manner.   Due to the immediate potential for life-threatening deterioration due to underlying acute neurologic illness, I spent 30 minutes providing critical care. This time includes time for face to face visit via telemedicine, review of medical records, imaging studies and discussion of findings with providers,  the patient and/or family.   Dr Launa Grill   [facility] [phone]  Case HS:[id number]

## 2020-01-30 NOTE — ED Triage Notes (Signed)
Pt arrives with daughter who states at 5:30pm tonight with slurred speech, bilat weakness. Denies vision change. No aphasia noted. Grip strength weak. No drift in arm/leg/face noted. A&O.

## 2020-01-30 NOTE — ED Provider Notes (Signed)
[facility] Emergency Department Provider Note   ____________________________________________   First MD Initiated Contact with Patient 01/30/20 1906     (approximate)  I have reviewed the triage vital signs and the nursing notes.   HISTORY  Chief Complaint Weakness  EM caveat: Patient does not recall parts of the event  HPI COLEY PERDUE is a 74 y.o. female history of diabetes hypertension  Patient was with her family when she suddenly became confused, had slurred speech, then acted a bit aggressively and defecated on herself but now family reports she is seemingly come back to her normal.   They never saw her to be weak on one side.  There was no seizure-like activity noted.  She has been her normal health except the reports she has uncontrolled diabetes  No recent illnesses or known exposure to Covid   Past Medical History:  Diagnosis Date  . Diabetes mellitus without complication ([place])   . High cholesterol   . Hypertension     Patient Active Problem List   Diagnosis Date Noted  . Incomplete uterine prolapse 12/08/2017  . Cystocele, midline 12/08/2017  . Bipolar affective disorder, manic, severe, with psychotic behavior ([place])   . Diabetes ([place]) 04/01/2016  . Dyslipidemia 04/01/2016  . Brief psychotic disorder ([place]) 03/28/2016  . Noncompliance 03/28/2016  . Essential hypertension 03/09/2016    Past Surgical History:  Procedure Laterality Date  . DILATION AND CURETTAGE OF UTERUS    . POLYPECTOMY      Prior to Admission medications   Medication Sig Start Date End Date Taking? Authorizing Provider  aspirin EC 81 MG tablet Take 81 mg by mouth daily.     [provider]  bisoprolol (ZEBETA) 5 MG tablet Take 5 mg by mouth daily.  12/25/16 12/25/17  [provider]  metFORMIN (GLUCOPHAGE) 500 MG tablet Take 1,000 mg by mouth 2 (two) times daily. 01/04/16   [provider]  simvastatin (ZOCOR) 40 MG tablet Take 40 mg  by mouth daily.  01/29/16   [provider]  spironolactone (ALDACTONE) 25 MG tablet Take 25 mg by mouth daily. 02/18/16   [provider]    Allergies Glimepiride, Glipizide, Lisinopril, Losartan, Norco [hydrocodone-acetaminophen], and Pioglitazone  Family History  Problem Relation Age of Onset  . Breast cancer Maternal Aunt        2 aunts in their 18's  . Breast cancer Maternal Aunt 70  . Ovarian cancer Neg Hx   . Colon cancer Neg Hx     Social History Social History   Tobacco Use  . Smoking status: Never Smoker  . Smokeless tobacco: Never Used  Substance Use Topics  . Alcohol use: No  . Drug use: No    Review of Systems Constitutional: No fever/chills Eyes: No visual changes. Cardiovascular: Denies chest pain. Respiratory: Denies shortness of breath. Gastrointestinal: No abdominal pain.   Musculoskeletal: Negative for back pain or neck pain. Skin: Negative for rash. Neurological: Negative for headaches or areas of focal weakness or numbness.  However the patient does report she feels a bit weak in both of her lower legs and also a little bit in both arms.    ____________________________________________   PHYSICAL EXAM:  VITAL SIGNS: ED Triage Vitals [01/30/20 1854]  Enc Vitals Group     BP 140/63     Pulse Rate 99     Resp 18     Temp 98.7 F (37.1 C)     Temp Source Oral  SpO2 97 %     Weight 155 lb (70.3 kg)     Height 5\' 1"  (1.549 m)     Head Circumference      Peak Flow      Pain Score 0     Pain Loc      Pain Edu?      Excl. in [place]?     Constitutional: Alert and oriented. Well appearing and in no acute distress. Eyes: Conjunctivae are normal. Head: Atraumatic. Nose: No congestion/rhinnorhea. Mouth/Throat: Mucous membranes are moist. Neck: No stridor.  Normal extraocular movements and visual fields.  No facial droop.  Clear speech.  Fully oriented Cardiovascular: Normal rate, regular rhythm. Grossly normal heart sounds.   Good peripheral circulation. Respiratory: Normal respiratory effort.  No retractions. Lungs CTAB. Gastrointestinal: Soft and nontender. No distention. Musculoskeletal: No lower extremity tenderness nor edema. Neurologic:  Normal speech and language. No gross focal neurologic deficits are appreciated except for bilateral lower extremity weakness. NIH = 4 (see my notes) Skin:  Skin is warm, dry and intact. No rash noted. Psychiatric: Mood and affect are normal. Speech and behavior are normal.  ____________________________________________   LABS (all labs ordered are listed, but only abnormal results are displayed)  Labs Reviewed  COMPREHENSIVE METABOLIC PANEL - Abnormal; Notable for the following components:      Result Value   Glucose, Bld 374 (*)    GFR calc non Af Amer 57 (*)    All other components within normal limits  GLUCOSE, CAPILLARY - Abnormal; Notable for the following components:   Glucose-Capillary 347 (*)    All other components within normal limits  PROTIME-INR  APTT  CBC  DIFFERENTIAL  CBG MONITORING, ED  I-STAT CREATININE, ED  CBG MONITORING, ED  CBG MONITORING, ED  CBG MONITORING, ED  CBG MONITORING, ED  CBG MONITORING, ED  CBG MONITORING, ED  CBG MONITORING, ED   ____________________________________________  EKG  Reviewed entered by me at 1910 Heart rate 95 QRS 80 QTc 460 Normal sinus rhythm, some baseline wander.  Possible old inferior MI.  No evidence of acute ischemia denoted ____________________________________________  RADIOLOGY  CT HEAD CODE STROKE WO CONTRAST  Addendum Date: 01/30/2020   ADDENDUM REPORT: 01/30/2020 19:21 ADDENDUM: Study discussed by telephone with Dr. Jacqualine Code on 01/30/2020 at 1918 hours. Electronically Signed   By: Genevie Ann M.D.   On: 01/30/2020 19:21   Result Date: 01/30/2020 CLINICAL DATA:  Code stroke. 74 year old female with sudden onset slurred speech. EXAM: CT HEAD WITHOUT CONTRAST TECHNIQUE: Contiguous axial images were  obtained from the base of the skull through the vertex without intravenous contrast. COMPARISON:  Head CT 03/28/2016. FINDINGS: Brain: Increased patchy bilateral white matter hypodensity since 2017, and there is a small new hypodensity in the right thalamus on series 2, image 12. Mild generalized volume loss since 2017. No midline shift, mass effect, or evidence of intracranial mass lesion. No acute intracranial hemorrhage identified. No ventriculomegaly. No cortically based acute infarct identified. No cortical encephalomalacia identified. Brainstem and cerebellum appear negative. Vascular: Extensive Calcified atherosclerosis at the skull base. Distal right MCA calcified plaque suspected on series 5, image 11. No suspicious intracranial vascular hyperdensity. Skull: Chronic hyperostosis, normal variant. No acute osseous abnormality identified. Sinuses/Orbits: Visualized paranasal sinuses and mastoids are stable and well pneumatized. Other: Visualized orbits and scalp soft tissues are within normal limits. ASPECTS [facility] Stroke Program Early CT Score) Total score (0-10 with 10 being normal): 10 IMPRESSION: 1. Age indeterminate lacunar infarct in the right  thalamus. Progressed cerebral white matter disease since 2017. 2. No acute cortically based infarct or acute intracranial hemorrhage identified. 3. ASPECTS 10. Electronically Signed: By: Genevie Ann M.D. On: 01/30/2020 19:11    CT head negative for acute, though there is an age-indeterminate lacunar infarct right thalamus ____________________________________________   PROCEDURES  Procedure(s) performed: 3 lead  .1-3 Lead EKG Interpretation Performed by: Delman Kitten, MD Authorized by: Delman Kitten, MD     Interpretation: normal     ECG rate:  65   ECG rate assessment: normal     Rhythm: sinus rhythm     Ectopy: none     Conduction: normal      Critical Care performed: No  CRITICAL CARE Performed by: Delman Kitten   Total critical care time:  25 minutes  Critical care time was exclusive of separately billable procedures and treating other patients.  Critical care was necessary to treat or prevent imminent or life-threatening deterioration.  Critical care was time spent personally by me on the following activities: development of treatment plan with patient and/or surrogate as well as nursing, discussions with consultants, evaluation of patient's response to treatment, examination of patient, obtaining history from patient or surrogate, ordering and performing treatments and interventions, ordering and review of laboratory studies, ordering and review of radiographic studies, pulse oximetry and re-evaluation of patient's condition.  ____________________________________________   INITIAL IMPRESSION / ASSESSMENT AND PLAN / ED COURSE  Pertinent labs & imaging results that were available during my care of the patient were reviewed by me and considered in my medical decision making (see chart for details).   Patient presents for an episode of sudden change with slurred speech, defecated on herself, seemed confused but family at the bedside says she seems to be clearing up now afterwards and on exam she has seemingly bilateral weakness in her lower legs but does not have any evidence of unilateral symptoms that would to me suggest a high probability of acute large vessel or acute stroke.  I am also concerned that this could represent something else such as a seizure, be toxic metabolic in nature, represent delirium encephalopathy or a broad differential.  I do not believe that she is a TPA candidate, I am concerned a different process may be at play here seems less likely an acute stroke.  Teleneurology seeing the patient now  Clinical Course as of Jan 29 1949  Tue Jan 30, 2020  1906 RN just notified me of code stroke being placed into my treatment room. Going to see her now.   [MQ]  1914 NIH exam score equals 4, however these findings  represent bilateral weakness/drift in the lower legs.  This is not consistent with stroke.   [MQ]  1943 Discussed with Dr. Lyndel Safe. Teleneuro   [MQ]    Clinical Course User Index [MQ] Delman Kitten, MD    ----------------------------------------- 7:17 PM on 01/30/2020 -----------------------------------------  Teleneurology seeing the patient at this time.  Head CT discussed with Dr. Nevada Crane of radiology, there is no evidence of acute infarct or hemorrhage.  There is a new lacunar infarct in the RIGHT thalamus.   VAN scale is NEGATIVE for LVO on my exam  ----------------------------------------- 7:50 PM on 01/30/2020 -----------------------------------------  3-lead III lead discussed with neurology, they recommend no TPA as this does not appear consistent with a stroke and I would agree.  There is some concern this could represent a seizure-like event, and they are recommending EEG and MRI with seizure precautions for this  evening.  Discussed with the patient she is comfortable with this plan we will admit her for further work-up   ____________________________________________   FINAL CLINICAL IMPRESSION(S) / ED DIAGNOSES  Final diagnoses:  Hyperglycemia  Brief resolved unexplained event ([place])  Possible seizure-like event     Note:  This document was prepared using Dragon voice recognition software and may include unintentional dictation errors       Delman Kitten, MD 01/30/20 1955

## 2020-01-30 NOTE — Progress Notes (Signed)
Chaplain provided supportive presence during Code Stroke work-up. Sabrina [name] openly shared with chaplain her tears for her complex grief and the rejection related to her grief. Chaplain offered listening presence and encouraged Sabrina [name] to engage in breathing exercises to reclaim her goodness and her truth during her hospital stay. Sabrina [name] recognizes she needs time away - she asked for a 3 day admission. Daughter was present and shared she wants to be involved in her mothers care while feeling her mother keeps secrets.     01/30/20 2000  Clinical Encounter Type  Visited With Patient;Family  Visit Type Initial;ED  Referral From Nurse  Consult/Referral To Chaplain  Spiritual Encounters  Spiritual Needs Emotional;Grief support  Stress Factors  Patient Stress Factors Other (Comment)

## 2020-01-30 NOTE — ED Notes (Signed)
Tele-neuro called.  Raiford Noble, RN on screen.

## 2020-01-30 NOTE — H&P (Signed)
History and Physical    Sabrina [name] [id number] DOB: 29-Mar-1946 DOA: 01/30/2020  PCP: [name], MD   Patient coming from: [place] have personally briefly reviewed patient's old medical records in [place]  Chief Complaint: confusion and slurred speech  HPI: Sabrina [name] is a 74 y.o. female with medical history significant for diabetes and hypertension, as well as anxiety with history of a brief psychiatric admission in 2017, who was brought to the emergency room as a code stroke after she experienced a brief neurologic episode earlier in the afternoon while was driving her grandchildren home from school.   According to the daughter she had slurred speech, confusion, became combative and defecated on herself.  She was brought to the emergency room by which time symptoms had resolved and she was back to near normal but continued to have intermittent slurred unintelligible speech and to have generalized weakness.  There was no jerky activity.  She denies one-sided numbness tingling or weakness in the face or extremities.  She denies chest pain, shortness of breath palpitations and denies lightheadedness, both preceding and following the episode. ED Course: Vitals in the ER were within normal limits.  Blood work unremarkable except for blood sugar of 347.  CT head showed age-indeterminate lacunar infarct in the right thalamus and progressed cerebral white matter disease since 2017 but no acute cortically based infarct or acute hemorrhage.  The emergency room provider consulted teleneurology.  Clinical presentation was not suggestive of LVO.  tPA not recommended.  Recommendations for EEG, MRI, seizure precautions and neuro check.  Hospitalist consulted for admission.  Review of Systems: As per HPI otherwise 10 point review of systems negative.    Past Medical History:  Diagnosis Date  . Diabetes mellitus without complication ([place])   . High cholesterol   . Hypertension     Past  Surgical History:  Procedure Laterality Date  . DILATION AND CURETTAGE OF UTERUS    . POLYPECTOMY       reports that she has never smoked. She has never used smokeless tobacco. She reports that she does not drink alcohol or use drugs.  Allergies  Allergen Reactions  . Glimepiride Diarrhea and Other (See Comments)  . Glipizide Other (See Comments)  . Lisinopril Other (See Comments)    headache  . Losartan Other (See Comments)    Headache  . Norco [Hydrocodone-Acetaminophen] Other (See Comments)    Mood changes  . Pioglitazone Swelling    On legs    Family History  Problem Relation Age of Onset  . Breast cancer Maternal Aunt        2 aunts in their 42's  . Breast cancer Maternal Aunt 70  . Ovarian cancer Neg Hx   . Colon cancer Neg Hx      Prior to Admission medications   Medication Sig Start Date End Date Taking? Authorizing Provider  aspirin EC 81 MG tablet Take 81 mg by mouth daily.    Yes [provider]  benazepril (LOTENSIN) 20 MG tablet Take 20 mg by mouth 2 (two) times daily.  11/10/19  Yes [provider]  isosorbide mononitrate (IMDUR) 60 MG 24 hr tablet Take 60 mg by mouth 2 (two) times daily.  01/22/20  Yes [provider]  rosuvastatin (CRESTOR) 20 MG tablet Take 20 mg by mouth at bedtime. 11/29/19  Yes [provider]  spironolactone (ALDACTONE) 25 MG tablet Take 25 mg by mouth daily. 02/18/16  Yes [provider]  Physical Exam: Vitals:   01/30/20 1854 01/30/20 1906 01/30/20 1908 01/30/20 1930  BP: 140/63 135/62  (!) 156/68  Pulse: 99 97 97 96  Resp: 18  17 18   Temp: 98.7 F (37.1 C)     TempSrc: Oral     SpO2: 97% 98% 98% 95%  Weight: 70.3 kg     Height: 5\' 1"  (1.549 m)        Vitals:   01/30/20 1854 01/30/20 1906 01/30/20 1908 01/30/20 1930  BP: 140/63 135/62  (!) 156/68  Pulse: 99 97 97 96  Resp: 18  17 18   Temp: 98.7 F (37.1 C)     TempSrc: Oral     SpO2: 97% 98% 98% 95%  Weight: 70.3 kg       Height: 5\' 1"  (1.549 m)       Constitutional: Alert and awake, oriented x3, not in any acute distress. Eyes: PERLA, EOMI, irises appear normal, anicteric sclera,  ENMT: external ears and nose appear normal, normal hearing             Lips appears normal, oropharynx mucosa, tongue, posterior pharynx appear normal  Neck: neck appears normal, no masses, normal ROM, no thyromegaly, no JVD  CVS: S1-S2 clear, no murmur rubs or gallops,  , no carotid bruits, pedal pulses palpable, No LE edema Respiratory:  clear to auscultation bilaterally, no wheezing, rales or rhonchi. Respiratory effort normal. No accessory muscle use.  Abdomen: soft nontender, nondistended, normal bowel sounds, no hepatosplenomegaly, no hernias Musculoskeletal: : no cyanosis, clubbing , no contractures or atrophy Neuro: Cranial nerves II-XII intact, sensation, reflexes normal, strength Psych: judgement and insight appear normal, stable mood and affect,  Skin: no rashes or lesions or ulcers, no induration or nodules   Labs on Admission: I have personally reviewed following labs and imaging studies  CBC: Recent Labs  Lab 01/30/20 1855  WBC 6.7  NEUTROABS 2.7  HGB 12.8  HCT 38.5  MCV 89.1  PLT [id number]   Basic Metabolic Panel: Recent Labs  Lab 01/30/20 1855  NA 137  K 4.1  CL 101  CO2 23  GLUCOSE 374*  BUN 21  CREATININE 0.99  CALCIUM 9.6   GFR: Estimated Creatinine Clearance: 45.4 mL/min (by C-G formula based on SCr of 0.99 mg/dL). Liver Function Tests: Recent Labs  Lab 01/30/20 1855  AST 23  ALT 21  ALKPHOS 72  BILITOT 0.6  PROT 7.1  ALBUMIN 4.1   No results for input(s): LIPASE, AMYLASE in the last 168 hours. No results for input(s): AMMONIA in the last 168 hours. Coagulation Profile: Recent Labs  Lab 01/30/20 1855  INR 1.0   Cardiac Enzymes: No results for input(s): CKTOTAL, CKMB, CKMBINDEX, TROPONINI in the last 168 hours. BNP (last 3 results) No results for input(s): PROBNP in the last  8760 hours. HbA1C: No results for input(s): HGBA1C in the last 72 hours. CBG: Recent Labs  Lab 01/30/20 1854  GLUCAP 347*   Lipid Profile: No results for input(s): CHOL, HDL, LDLCALC, TRIG, CHOLHDL, LDLDIRECT in the last 72 hours. Thyroid Function Tests: No results for input(s): TSH, T4TOTAL, FREET4, T3FREE, THYROIDAB in the last 72 hours. Anemia Panel: No results for input(s): VITAMINB12, FOLATE, FERRITIN, TIBC, IRON, RETICCTPCT in the last 72 hours. Urine analysis:    Component Value Date/Time   COLORURINE YELLOW (A) 04/01/2016 1100   APPEARANCEUR HAZY (A) 04/01/2016 1100   LABSPEC 1.014 04/01/2016 1100   PHURINE 5.0 04/01/2016 1100   GLUCOSEU >500 (A) 04/01/2016 1100  HGBUR NEGATIVE 04/01/2016 1100   BILIRUBINUR NEGATIVE 04/01/2016 1100   KETONESUR TRACE (A) 04/01/2016 1100   PROTEINUR 30 (A) 04/01/2016 1100   NITRITE NEGATIVE 04/01/2016 1100   LEUKOCYTESUR NEGATIVE 04/01/2016 1100    Radiological Exams on Admission: CT HEAD CODE STROKE WO CONTRAST  Addendum Date: 01/30/2020   ADDENDUM REPORT: 01/30/2020 19:21 ADDENDUM: Study discussed by telephone with Dr. Jacqualine Code on 01/30/2020 at 1918 hours. Electronically Signed   By: Genevie Ann M.D.   On: 01/30/2020 19:21   Result Date: 01/30/2020 CLINICAL DATA:  Code stroke. 74 year old female with sudden onset slurred speech. EXAM: CT HEAD WITHOUT CONTRAST TECHNIQUE: Contiguous axial images were obtained from the base of the skull through the vertex without intravenous contrast. COMPARISON:  Head CT 03/28/2016. FINDINGS: Brain: Increased patchy bilateral white matter hypodensity since 2017, and there is a small new hypodensity in the right thalamus on series 2, image 12. Mild generalized volume loss since 2017. No midline shift, mass effect, or evidence of intracranial mass lesion. No acute intracranial hemorrhage identified. No ventriculomegaly. No cortically based acute infarct identified. No cortical encephalomalacia identified. Brainstem and  cerebellum appear negative. Vascular: Extensive Calcified atherosclerosis at the skull base. Distal right MCA calcified plaque suspected on series 5, image 11. No suspicious intracranial vascular hyperdensity. Skull: Chronic hyperostosis, normal variant. No acute osseous abnormality identified. Sinuses/Orbits: Visualized paranasal sinuses and mastoids are stable and well pneumatized. Other: Visualized orbits and scalp soft tissues are within normal limits. ASPECTS [facility] Stroke Program Early CT Score) Total score (0-10 with 10 being normal): 10 IMPRESSION: 1. Age indeterminate lacunar infarct in the right thalamus. Progressed cerebral white matter disease since 2017. 2. No acute cortically based infarct or acute intracranial hemorrhage identified. 3. ASPECTS 10. Electronically Signed: By: Genevie Ann M.D. On: 01/30/2020 19:11    EKG: Independently reviewed.   Assessment/Plan Principal Problem:   Episode of transient neurologic symptoms -Patient arrived as a code stroke within the TPA window however was not deemed to be acute stroke by teleneurology -Symptoms were completely resolved by arrival in the emergency room -Etiology includes seizure versus TIA -MRI, EEG, echocardiogram -Ativan as needed seizures, aspirin, statin -Neurochecks, fall and aspiration precautions -Permissive hypertension for 24 hours -Neurology consult    Hyperglycemia due to type 2 diabetes mellitus (HCC) -Sliding scale insulin coverage -Follow A1c    Essential hypertension -BP 159/89 but will allow permissive hypertension  History of anxiety, untreated -Appears stable -Patient was seen by chaplain on arrival in the emergency room and voiced concern for several stressors.  Abnormal EKG -EKG showed possibility of old infarct.  No acute findings.  No complaints of chest pain -Patient was hospitalized for chest pain in 2017 and had a negative stress test -We will cycle troponins    DVT prophylaxis: Lovenox  Code  Status: full code  Family Communication:  none  Disposition Plan: Back to previous home environment Consults called: Neurology Status: obs    Athena Masse MD [facility]     01/30/2020, 8:28 PM

## 2020-01-30 NOTE — ED Notes (Signed)
Tele-neurologist on screen, Launa Grill, MD.

## 2020-01-31 ENCOUNTER — Observation Stay (HOSPITAL_COMMUNITY)
Admit: 2020-01-31 | Discharge: 2020-01-31 | Disposition: A | Discharge status: Home / Self Care | Payer: Medicare HMO | Attending: Internal Medicine | Admitting: Internal Medicine

## 2020-01-31 DIAGNOSIS — T383X5A Adverse effect of insulin and oral hypoglycemic [antidiabetic] drugs, initial encounter: Secondary | ICD-10-CM | POA: Diagnosis present

## 2020-01-31 DIAGNOSIS — R9082 White matter disease, unspecified: Secondary | ICD-10-CM | POA: Diagnosis present

## 2020-01-31 DIAGNOSIS — R531 Weakness: Secondary | ICD-10-CM | POA: Diagnosis present

## 2020-01-31 DIAGNOSIS — Z803 Family history of malignant neoplasm of breast: Secondary | ICD-10-CM | POA: Diagnosis not present

## 2020-01-31 DIAGNOSIS — E1165 Type 2 diabetes mellitus with hyperglycemia: Secondary | ICD-10-CM | POA: Diagnosis present

## 2020-01-31 DIAGNOSIS — K529 Noninfective gastroenteritis and colitis, unspecified: Secondary | ICD-10-CM | POA: Diagnosis present

## 2020-01-31 DIAGNOSIS — Z79899 Other long term (current) drug therapy: Secondary | ICD-10-CM | POA: Diagnosis not present

## 2020-01-31 DIAGNOSIS — I10 Essential (primary) hypertension: Secondary | ICD-10-CM | POA: Diagnosis present

## 2020-01-31 DIAGNOSIS — Z7982 Long term (current) use of aspirin: Secondary | ICD-10-CM | POA: Diagnosis not present

## 2020-01-31 DIAGNOSIS — R4781 Slurred speech: Secondary | ICD-10-CM | POA: Diagnosis present

## 2020-01-31 DIAGNOSIS — R29818 Other symptoms and signs involving the nervous system: Secondary | ICD-10-CM | POA: Diagnosis not present

## 2020-01-31 DIAGNOSIS — I639 Cerebral infarction, unspecified: Secondary | ICD-10-CM | POA: Diagnosis not present

## 2020-01-31 DIAGNOSIS — I6381 Other cerebral infarction due to occlusion or stenosis of small artery: Secondary | ICD-10-CM | POA: Diagnosis present

## 2020-01-31 DIAGNOSIS — I340 Nonrheumatic mitral (valve) insufficiency: Secondary | ICD-10-CM | POA: Diagnosis not present

## 2020-01-31 DIAGNOSIS — E785 Hyperlipidemia, unspecified: Secondary | ICD-10-CM | POA: Diagnosis present

## 2020-01-31 DIAGNOSIS — Z888 Allergy status to other drugs, medicaments and biological substances status: Secondary | ICD-10-CM | POA: Diagnosis not present

## 2020-01-31 DIAGNOSIS — F419 Anxiety disorder, unspecified: Secondary | ICD-10-CM | POA: Diagnosis present

## 2020-01-31 DIAGNOSIS — R739 Hyperglycemia, unspecified: Secondary | ICD-10-CM | POA: Diagnosis present

## 2020-01-31 DIAGNOSIS — F3189 Other bipolar disorder: Secondary | ICD-10-CM | POA: Diagnosis present

## 2020-01-31 DIAGNOSIS — R2990 Unspecified symptoms and signs involving the nervous system: Secondary | ICD-10-CM

## 2020-01-31 DIAGNOSIS — R29702 NIHSS score 2: Secondary | ICD-10-CM | POA: Diagnosis present

## 2020-01-31 DIAGNOSIS — Z7984 Long term (current) use of oral hypoglycemic drugs: Secondary | ICD-10-CM | POA: Diagnosis not present

## 2020-01-31 DIAGNOSIS — R4182 Altered mental status, unspecified: Secondary | ICD-10-CM | POA: Diagnosis present

## 2020-01-31 LAB — ECHOCARDIOGRAM COMPLETE
Height: 61 in
Weight: 2480 oz

## 2020-01-31 LAB — BASIC METABOLIC PANEL
Anion gap: 8 (ref 5–15)
BUN: 15 mg/dL (ref 8–23)
CO2: 26 mmol/L (ref 22–32)
Calcium: 8.9 mg/dL (ref 8.9–10.3)
Chloride: 104 mmol/L (ref 98–111)
Creatinine, Ser: 0.8 mg/dL (ref 0.44–1.00)
GFR calc Af Amer: >60 mL/min (ref >60)
GFR calc non Af Amer: >60 mL/min (ref >60)
Glucose, Bld: 202 mg/dL — ABNORMAL HIGH (ref 70–99)
Potassium: 3.6 mmol/L (ref 3.5–5.1)
Sodium: 138 mmol/L (ref 135–145)

## 2020-01-31 LAB — CBC
HCT: 39 % (ref 36.0–46.0)
Hemoglobin: 12.6 g/dL (ref 12.0–15.0)
MCH: 29.5 pg (ref 26.0–34.0)
MCHC: 32.3 g/dL (ref 30.0–36.0)
MCV: 91.3 fL (ref 80.0–100.0)
Platelets: 309 10*3/uL (ref 150–400)
RBC: 4.27 MIL/uL (ref 3.87–5.11)
RDW: 12.7 % (ref 11.5–15.5)
WBC: 4.9 10*3/uL (ref 4.0–10.5)
nRBC: 0 % (ref 0.0–0.2)

## 2020-01-31 LAB — LIPID PANEL
Cholesterol: 163 mg/dL (ref 0–200)
HDL: 39 mg/dL — ABNORMAL LOW (ref >40)
LDL Cholesterol: 66 mg/dL (ref 0–99)
Total CHOL/HDL Ratio: 4.2 RATIO
Triglycerides: 289 mg/dL — ABNORMAL HIGH (ref <150)
VLDL: 58 mg/dL — ABNORMAL HIGH (ref 0–40)

## 2020-01-31 LAB — GLUCOSE, CAPILLARY
Glucose-Capillary: 154 mg/dL — ABNORMAL HIGH (ref 70–99)
Glucose-Capillary: 171 mg/dL — ABNORMAL HIGH (ref 70–99)
Glucose-Capillary: 193 mg/dL — ABNORMAL HIGH (ref 70–99)
Glucose-Capillary: 220 mg/dL — ABNORMAL HIGH (ref 70–99)
Glucose-Capillary: 234 mg/dL — ABNORMAL HIGH (ref 70–99)
Glucose-Capillary: 236 mg/dL — ABNORMAL HIGH (ref 70–99)

## 2020-01-31 LAB — MAGNESIUM: Magnesium: 1.9 mg/dL (ref 1.7–2.4)

## 2020-01-31 MED ORDER — ROSUVASTATIN CALCIUM 10 MG PO TABS
20.0000 mg | ORAL_TABLET | Freq: Every day | ORAL | Status: DC
  Administered 2020-01-31: 20 mg via ORAL
  Filled 2020-01-31: qty 2

## 2020-01-31 MED ORDER — BENAZEPRIL HCL 20 MG PO TABS
20.0000 mg | ORAL_TABLET | Freq: Two times a day (BID) | ORAL | Status: DC
  Administered 2020-01-31: 22:00:00 20 mg via ORAL
  Filled 2020-01-31 (×2): qty 1

## 2020-01-31 MED ORDER — LOPERAMIDE HCL 2 MG PO CAPS
2.0000 mg | ORAL_CAPSULE | ORAL | Status: DC | PRN
  Administered 2020-01-31: 16:00:00 2 mg via ORAL
  Filled 2020-01-31: qty 1

## 2020-01-31 MED ORDER — HYDRALAZINE HCL 20 MG/ML IJ SOLN: 10.0000 mg | Freq: Four times a day (QID) | INTRAMUSCULAR | Status: DC | PRN

## 2020-01-31 MED ORDER — ASPIRIN 81 MG PO CHEW
324.0000 mg | CHEWABLE_TABLET | Freq: Every day | ORAL | Status: DC
  Administered 2020-02-01: 08:00:00 324 mg via ORAL
  Filled 2020-01-31: qty 4

## 2020-01-31 MED ORDER — SODIUM CHLORIDE 0.9% FLUSH
3.0000 mL | Freq: Two times a day (BID) | INTRAVENOUS | Status: DC
  Administered 2020-01-31 – 2020-02-01 (×2): 3 mL via INTRAVENOUS

## 2020-01-31 MED ORDER — LIVING WELL WITH DIABETES BOOK
Freq: Once | Status: AC
  Filled 2020-01-31: qty 1

## 2020-01-31 MED ORDER — ISOSORBIDE MONONITRATE ER 60 MG PO TB24
60.0000 mg | ORAL_TABLET | Freq: Two times a day (BID) | ORAL | Status: DC
  Administered 2020-02-01: 08:00:00 60 mg via ORAL
  Filled 2020-01-31: qty 1

## 2020-01-31 MED ORDER — SPIRONOLACTONE 25 MG PO TABS
25.0000 mg | ORAL_TABLET | Freq: Every day | ORAL | Status: DC
  Administered 2020-02-01: 25 mg via ORAL
  Filled 2020-01-31: qty 1

## 2020-01-31 NOTE — ED Notes (Signed)
Pt ambulatory to restroom with assistance.

## 2020-01-31 NOTE — Consult Note (Signed)
Reason for Consult:stroke  Requesting Physician: Dr. Damita Dunnings  CC: slurred speech    HPI: Sabrina [name] is an 74 y.o. female medical history significant for diabetes and hypertension, as well as anxiety with history of a brief psychiatric admission in 2017, who was brought to the emergency room as a code stroke after she experienced a brief neurologic episode yesterday and currently back to baseline.  Pt found to have R thalamic and paramedian strokes  Past Medical History:  Diagnosis Date  . Diabetes mellitus without complication ([place])   . High cholesterol   . Hypertension     Past Surgical History:  Procedure Laterality Date  . DILATION AND CURETTAGE OF UTERUS    . POLYPECTOMY      Family History  Problem Relation Age of Onset  . Breast cancer Maternal Aunt        2 aunts in their 108's  . Breast cancer Maternal Aunt 70  . Ovarian cancer Neg Hx   . Colon cancer Neg Hx     Social History:  reports that she has never smoked. She has never used smokeless tobacco. She reports that she does not drink alcohol or use drugs.  Allergies  Allergen Reactions  . Glimepiride Diarrhea and Other (See Comments)  . Glipizide Other (See Comments)  . Lisinopril Other (See Comments)    headache  . Losartan Other (See Comments)    Headache  . Norco [Hydrocodone-Acetaminophen] Other (See Comments)    Mood changes  . Pioglitazone Swelling    On legs    Medications: I have reviewed the patient's current medications.  ROS: History obtained from the patient  General ROS: negative for - chills, fatigue, fever, night sweats, weight gain or weight loss Psychological ROS: negative for - behavioral disorder, hallucinations, memory difficulties, mood swings or suicidal ideation Ophthalmic ROS: negative for - blurry vision, double vision, eye pain or loss of vision ENT ROS: negative for - epistaxis, nasal discharge, oral lesions, sore throat, tinnitus or vertigo Allergy and Immunology ROS:  negative for - hives or itchy/watery eyes Hematological and Lymphatic ROS: negative for - bleeding problems, bruising or swollen lymph nodes Endocrine ROS: negative for - galactorrhea, hair pattern changes, polydipsia/polyuria or temperature intolerance Respiratory ROS: negative for - cough, hemoptysis, shortness of breath or wheezing Cardiovascular ROS: negative for - chest pain, dyspnea on exertion, edema or irregular heartbeat Gastrointestinal ROS: negative for - abdominal pain, diarrhea, hematemesis, nausea/vomiting or stool incontinence Genito-Urinary ROS: negative for - dysuria, hematuria, incontinence or urinary frequency/urgency Musculoskeletal ROS: negative for - joint swelling or muscular weakness Neurological ROS: as noted in HPI Dermatological ROS: negative for rash and skin lesion changes  Physical Examination: Blood pressure (!) 160/75, pulse 72, temperature 98.3 F (36.8 C), temperature source Oral, resp. rate 18, height 5\' 1"  (1.549 m), weight 70.3 kg, SpO2 98 %.    Neurological Examination   Mental Status: Alert, oriented, thought content appropriate.  Speech fluent without evidence of aphasia.  Able to follow 3 step commands without difficulty. Cranial Nerves: II: Discs flat bilaterally; Visual fields grossly normal, pupils equal, round, reactive to light and accommodation III,IV, VI: ptosis not present, extra-ocular motions intact bilaterally V,VII: smile symmetric, facial light touch sensation normal bilaterally VIII: hearing normal bilaterally IX,X: gag reflex present XI: bilateral shoulder shrug XII: midline tongue extension Motor: Generalized weakness but symmetrical  Sensory: Pinprick and light touch intact throughout, bilaterally Deep Tendon Reflexes: 1+ and symmetric throughout Plantars: Right: downgoing   Left: downgoing Cerebellar:  normal finger-to-nose      Laboratory Studies:   Basic Metabolic Panel: Recent Labs  Lab 01/30/20 1855  01/31/20 0959  NA 137 138  K 4.1 3.6  CL 101 104  CO2 23 26  GLUCOSE 374* 202*  BUN 21 15  CREATININE 0.99 0.80  CALCIUM 9.6 8.9  MG  --  1.9    Liver Function Tests: Recent Labs  Lab 01/30/20 1855  AST 23  ALT 21  ALKPHOS 72  BILITOT 0.6  PROT 7.1  ALBUMIN 4.1   No results for input(s): LIPASE, AMYLASE in the last 168 hours. No results for input(s): AMMONIA in the last 168 hours.  CBC: Recent Labs  Lab 01/30/20 1855 01/31/20 0959  WBC 6.7 4.9  NEUTROABS 2.7  --   HGB 12.8 12.6  HCT 38.5 39.0  MCV 89.1 91.3  PLT 337 309    Cardiac Enzymes: No results for input(s): CKTOTAL, CKMB, CKMBINDEX, TROPONINI in the last 168 hours.  BNP: Invalid input(s): POCBNP  CBG: Recent Labs  Lab 01/30/20 2240 01/30/20 2358 01/31/20 0426 01/31/20 0725 01/31/20 1145  GLUCAP 311* 236* 220* 234* 154*    Microbiology: No results found for this or any previous visit.  Coagulation Studies: Recent Labs    01/30/20 1855  LABPROT 12.8  INR 1.0    Urinalysis: No results for input(s): COLORURINE, LABSPEC, PHURINE, GLUCOSEU, HGBUR, BILIRUBINUR, KETONESUR, PROTEINUR, UROBILINOGEN, NITRITE, LEUKOCYTESUR in the last 168 hours.  Invalid input(s): APPERANCEUR  Lipid Panel:     Component Value Date/Time   CHOL 163 01/31/2020 0434   TRIG 289 (H) 01/31/2020 0434   HDL 39 (L) 01/31/2020 0434   CHOLHDL 4.2 01/31/2020 0434   VLDL 58 (H) 01/31/2020 0434   LDLCALC 66 01/31/2020 0434    HgbA1C:  Lab Results  Component Value Date   HGBA1C 10.2 (H) 03/31/2016    Urine Drug Screen:      Component Value Date/Time   LABOPIA NONE DETECTED 04/01/2016 1100   COCAINSCRNUR NONE DETECTED 04/01/2016 1100   LABBENZ NONE DETECTED 04/01/2016 1100   AMPHETMU NONE DETECTED 04/01/2016 1100   THCU NONE DETECTED 04/01/2016 1100   LABBARB NONE DETECTED 04/01/2016 1100    Alcohol Level: No results for input(s): ETH in the last 168 hours.  Other results: EKG: normal EKG, normal sinus  rhythm, unchanged from previous tracings.  Imaging: MR BRAIN WO CONTRAST  Result Date: 01/30/2020 CLINICAL DATA:  Initial evaluation for acute slurred speech, generalized weakness. EXAM: MRI HEAD WITHOUT CONTRAST TECHNIQUE: Multiplanar, multiecho pulse sequences of the brain and surrounding structures were obtained without intravenous contrast. COMPARISON:  Prior head CT from earlier the same day. FINDINGS: Brain: Generalized age-related cerebral atrophy. Patchy T2/FLAIR hyperintensity within the periventricular deep white matter both cerebral hemispheres most consistent with chronic small vessel ischemic disease, moderate in nature. Approximate 6 mm focus of restricted diffusion involving the right thalamus consistent with an acute ischemic infarct (series 5, images 22, 21). Additional patchy diffusion abnormality measuring up to 14 mm in length seen involving the left paramedian pons (series 5, image 16), consistent with an acute ischemic infarct as well. No associated hemorrhage or mass effect. No other evidence for acute or subacute ischemia. No encephalomalacia to suggest chronic cortical infarction elsewhere within the brain. No foci of susceptibility artifact to suggest acute or chronic intracranial hemorrhage. No mass lesion, midline shift or mass effect. No hydrocephalus or extra-axial fluid collection. Pituitary gland suprasellar region normal. Midline structures intact. Vascular: Major intracranial vascular flow  voids are maintained. Skull and upper cervical spine: Craniocervical junction within normal limits. Upper cervical spine normal. Bone marrow signal intensity within normal limits. No scalp soft tissue abnormality. Sinuses/Orbits: Globes and orbital soft tissues within normal limits. Patient status post bilateral ocular lens replacement. Paranasal sinuses are largely clear. No significant mastoid effusion. Inner ear structures grossly normal. Other: None. IMPRESSION: 1. Small acute ischemic  infarcts involving the right thalamus and left paramedian pons as above. No associated hemorrhage or mass effect. 2. Underlying moderate chronic microvascular ischemic disease for age. Electronically Signed   By: Jeannine Boga M.D.   On: 01/30/2020 22:13   US Carotid Bilateral (at [facility] and [facility] only)  Result Date: 01/31/2020 CLINICAL DATA:  74 year old female with a history transient neurologic symptoms EXAM: BILATERAL CAROTID DUPLEX ULTRASOUND TECHNIQUE: Pearline Cables scale imaging, color Doppler and duplex ultrasound were performed of bilateral carotid and vertebral arteries in the neck. COMPARISON:  None. FINDINGS: Criteria: Quantification of carotid stenosis is based on velocity parameters that correlate the residual internal carotid diameter with NASCET-based stenosis levels, using the diameter of the distal internal carotid lumen as the denominator for stenosis measurement. The following velocity measurements were obtained: RIGHT ICA:  Systolic 123XX123 cm/sec, Diastolic 19 cm/sec CCA:  69 cm/sec SYSTOLIC ICA/CCA RATIO:  1.8 ECA:  137 cm/sec LEFT ICA:  Systolic 73 cm/sec, Diastolic 16 cm/sec CCA:  69 cm/sec SYSTOLIC ICA/CCA RATIO:  1.1 ECA:  118 cm/sec Right Brachial SBP: Not acquired Left Brachial SBP: Not acquired RIGHT CAROTID ARTERY: No significant calcifications of the right common carotid artery. Intermediate waveform maintained. Moderate heterogeneous and partially calcified plaque at the right carotid bifurcation. No significant lumen shadowing. Low resistance waveform of the right ICA. No significant tortuosity. RIGHT VERTEBRAL ARTERY: Antegrade flow with low resistance waveform. LEFT CAROTID ARTERY: No significant calcifications of the left common carotid artery. Intermediate waveform maintained. Moderate heterogeneous and partially calcified plaque at the left carotid bifurcation. No significant lumen shadowing. Low resistance waveform of the left ICA. No significant tortuosity. LEFT VERTEBRAL ARTERY:   Antegrade flow with low resistance waveform. IMPRESSION: Color duplex indicates minimal heterogeneous and calcified plaque, with no hemodynamically significant stenosis by duplex criteria in the extracranial cerebrovascular circulation. Signed, Dulcy Fanny. Dellia Nims, RPVI [facility] Radiology Electronically Signed   By: Corrie Mckusick D.O.   On: 01/31/2020 09:18   DG Chest Port 1 View  Result Date: 01/30/2020 CLINICAL DATA:  Aphasia and bilateral weakness. EXAM: PORTABLE CHEST 1 VIEW COMPARISON:  March 28, 2016 FINDINGS: A 3 mm well-defined calcified nodular opacity is seen overlying the lateral aspect of the mid right lung. This is not clearly seen on the prior study. There is no evidence of acute infiltrate, pleural effusion or pneumothorax. The heart size and mediastinal contours are within normal limits. There is mild calcification of the aortic arch. Multilevel degenerative changes seen throughout the thoracic spine. IMPRESSION: No active disease. Electronically Signed   By: Virgina Norfolk M.D.   On: 01/30/2020 21:10   CT HEAD CODE STROKE WO CONTRAST  Addendum Date: 01/30/2020   ADDENDUM REPORT: 01/30/2020 19:21 ADDENDUM: Study discussed by telephone with Dr. Jacqualine Code on 01/30/2020 at 1918 hours. Electronically Signed   By: Genevie Ann M.D.   On: 01/30/2020 19:21   Result Date: 01/30/2020 CLINICAL DATA:  Code stroke. 74 year old female with sudden onset slurred speech. EXAM: CT HEAD WITHOUT CONTRAST TECHNIQUE: Contiguous axial images were obtained from the base of the skull through the vertex without  intravenous contrast. COMPARISON:  Head CT 03/28/2016. FINDINGS: Brain: Increased patchy bilateral white matter hypodensity since 2017, and there is a small new hypodensity in the right thalamus on series 2, image 12. Mild generalized volume loss since 2017. No midline shift, mass effect, or evidence of intracranial mass lesion. No acute intracranial hemorrhage  identified. No ventriculomegaly. No cortically based acute infarct identified. No cortical encephalomalacia identified. Brainstem and cerebellum appear negative. Vascular: Extensive Calcified atherosclerosis at the skull base. Distal right MCA calcified plaque suspected on series 5, image 11. No suspicious intracranial vascular hyperdensity. Skull: Chronic hyperostosis, normal variant. No acute osseous abnormality identified. Sinuses/Orbits: Visualized paranasal sinuses and mastoids are stable and well pneumatized. Other: Visualized orbits and scalp soft tissues are within normal limits. ASPECTS [facility] Stroke Program Early CT Score) Total score (0-10 with 10 being normal): 10 IMPRESSION: 1. Age indeterminate lacunar infarct in the right thalamus. Progressed cerebral white matter disease since 2017. 2. No acute cortically based infarct or acute intracranial hemorrhage identified. 3. ASPECTS 10. Electronically Signed: By: Genevie Ann M.D. On: 01/30/2020 19:11     Assessment/Plan:   74 y.o. female medical history significant for diabetes and hypertension, as well as anxiety with history of a brief psychiatric admission in 2017, who was brought to the emergency room as a code stroke after she experienced a brief neurologic episode yesterday and currently back to baseline.  Pt found to have R thalamic and paramedian strokes  - Patient is hypertensive, strokes likely in setting of small vessel disease from posterior circulation perforators  - pt/ot - Increase ASA to 325 daily and statin as she is on ASA 81mg  daily - Blood pressure control - d/c planning based on Pt/ot 01/31/2020, 12:02 PM
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
## 2020-01-31 NOTE — Progress Notes (Signed)
PROGRESS NOTE    KODY PURECO  [id number] DOB: 12-Aug-1946 DOA: 01/30/2020 PCP: [name], MD    Assessment & Plan:   Principal Problem:   Episode of transient neurologic symptoms Active Problems:   Essential hypertension   Hyperglycemia due to type 2 diabetes mellitus ([place])   Acute CVA (cerebrovascular accident) ([place])    CARMELIA SYMMES is a 74 y.o. AA female with medical history significant for diabetes and hypertension, as well as anxiety with history of a brief psychiatric admission in 2017, who was brought to the emergency room as a code stroke after she experienced a brief neurologic episode earlier in the afternoon while was driving her grandchildren home from school.   According to the daughter she had slurred speech, confusion, became combative and defecated on herself.  She was brought to the emergency room by which time symptoms had resolved and she was back to near normal but continued to have intermittent slurred unintelligible speech and to have generalized weakness.   Episode of transient neurologic symptoms Acute R thalamic and paramedian strokes -Patient arrived as a code stroke within the TPA window however was not deemed to be acute stroke by teleneurology. -Symptoms were completely resolved by arrival in the emergency room -echocardiogram showed "Evidence of atrial level shunting."  Carotid US showed no significant stenosis.  LDL 66. --Per neuro, "strokes likely in setting of small vessel disease from posterior circulation perforators." PLAN: - Increase ASA to 325 daily  --continue home statin (pt is already at goal LDL) --Permissive HTN for 24-48 hours --Control HTN -Neurochecks, fall and aspiration precautions --PT/OT    Hyperglycemia due to type 2 diabetes mellitus (HCC) -Sliding scale insulin coverage    Essential hypertension -Hold BP meds  For permissive HTN for 24-48 hours --resume home Lotensin, Imdur and Aldactone tomorrow  History of  anxiety, untreated -Appears stable -Patient was seen by chaplain on arrival in the emergency room and voiced concern for several stressors.  Abnormal EKG -EKG showed possibility of old infarct.  No acute findings.  No complaints of chest pain -Patient was hospitalized for chest pain in 2017 and had a negative stress test -trop 20, 20  Chronic diarrhea --due to metformin use   DVT prophylaxis: Lovenox SQ Code Status: Full code  Family Communication: Daughter updated at bedside Disposition Plan: Home tomorrow with [facility] PT/OT   Subjective and Interval History:  Pt reported that she just didn't feel right, but couldn't be specific.  Has chronic diarrhea from taking metformin.  No fever, dyspnea, chest pain, abdominal pain, N/V.     Objective: Vitals:   01/31/20 1641 01/31/20 1705 01/31/20 1956 01/31/20 2017  BP: (!) 143/93  (!) 203/78 (!) 176/81  Pulse: 72  75 73  Resp: 17   16  Temp: 98.3 F (36.8 C)  98.6 F (37 C)   TempSrc: Oral  Oral   SpO2: 100%  100%   Weight:  71.6 kg    Height:  5\' 1"  (1.549 m)      Intake/Output Summary (Last 24 hours) at 01/31/2020 2119 Last data filed at 01/31/2020 2026 Gross per 24 hour  Intake --  Output 0 ml  Net 0 ml   Filed Weights   01/30/20 1854 01/31/20 1705  Weight: 70.3 kg 71.6 kg    Examination:   Constitutional: NAD, AAOx3 HEENT: conjunctivae and lids normal, EOMI CV: RRR no M,R,G. Distal pulses +2.  No cyanosis.   RESP: CTA B/L, normal respiratory effort  GI: +BS, NTND Extremities: No effusions, edema, or tenderness in BLE SKIN: warm, dry and intact Neuro: II - XII grossly intact.  Sensation intact   Data Reviewed: I have personally reviewed following labs and imaging studies  CBC: Recent Labs  Lab 01/30/20 1855 01/31/20 0959  WBC 6.7 4.9  NEUTROABS 2.7  --   HGB 12.8 12.6  HCT 38.5 39.0  MCV 89.1 91.3  PLT 337 [id number]   Basic Metabolic Panel: Recent Labs  Lab 01/30/20 1855 01/31/20 0959  NA 137 138  K 4.1  3.6  CL 101 104  CO2 23 26  GLUCOSE 374* 202*  BUN 21 15  CREATININE 0.99 0.80  CALCIUM 9.6 8.9  MG  --  1.9   GFR: Estimated Creatinine Clearance: 56.7 mL/min (by C-G formula based on SCr of 0.8 mg/dL). Liver Function Tests: Recent Labs  Lab 01/30/20 1855  AST 23  ALT 21  ALKPHOS 72  BILITOT 0.6  PROT 7.1  ALBUMIN 4.1   No results for input(s): LIPASE, AMYLASE in the last 168 hours. No results for input(s): AMMONIA in the last 168 hours. Coagulation Profile: Recent Labs  Lab 01/30/20 1855  INR 1.0   Cardiac Enzymes: No results for input(s): CKTOTAL, CKMB, CKMBINDEX, TROPONINI in the last 168 hours. BNP (last 3 results) No results for input(s): PROBNP in the last 8760 hours. HbA1C: No results for input(s): HGBA1C in the last 72 hours. CBG: Recent Labs  Lab 01/31/20 0426 01/31/20 0725 01/31/20 1145 01/31/20 1642 01/31/20 2047  GLUCAP 220* 234* 154* 171* 193*   Lipid Profile: Recent Labs    01/31/20 0434  CHOL 163  HDL 39*  LDLCALC 66  TRIG 289*  CHOLHDL 4.2   Thyroid Function Tests: No results for input(s): TSH, T4TOTAL, FREET4, T3FREE, THYROIDAB in the last 72 hours. Anemia Panel: No results for input(s): VITAMINB12, FOLATE, FERRITIN, TIBC, IRON, RETICCTPCT in the last 72 hours. Sepsis Labs: No results for input(s): PROCALCITON, LATICACIDVEN in the last 168 hours.  No results found for this or any previous visit (from the past 240 hour(s)).    Radiology Studies: MR BRAIN WO CONTRAST  Result Date: 01/30/2020 CLINICAL DATA:  Initial evaluation for acute slurred speech, generalized weakness. EXAM: MRI HEAD WITHOUT CONTRAST TECHNIQUE: Multiplanar, multiecho pulse sequences of the brain and surrounding structures were obtained without intravenous contrast. COMPARISON:  Prior head CT from earlier the same day. FINDINGS: Brain: Generalized age-related cerebral atrophy. Patchy T2/FLAIR hyperintensity within the periventricular deep white matter both  cerebral hemispheres most consistent with chronic small vessel ischemic disease, moderate in nature. Approximate 6 mm focus of restricted diffusion involving the right thalamus consistent with an acute ischemic infarct (series 5, images 22, 21). Additional patchy diffusion abnormality measuring up to 14 mm in length seen involving the left paramedian pons (series 5, image 16), consistent with an acute ischemic infarct as well. No associated hemorrhage or mass effect. No other evidence for acute or subacute ischemia. No encephalomalacia to suggest chronic cortical infarction elsewhere within the brain. No foci of susceptibility artifact to suggest acute or chronic intracranial hemorrhage. No mass lesion, midline shift or mass effect. No hydrocephalus or extra-axial fluid collection. Pituitary gland suprasellar region normal. Midline structures intact. Vascular: Major intracranial vascular flow voids are maintained. Skull and upper cervical spine: Craniocervical junction within normal limits. Upper cervical spine normal. Bone marrow signal intensity within normal limits. No scalp soft tissue abnormality. Sinuses/Orbits: Globes and orbital soft tissues within normal limits. Patient status post bilateral  ocular lens replacement. Paranasal sinuses are largely clear. No significant mastoid effusion. Inner ear structures grossly normal. Other: None. IMPRESSION: 1. Small acute ischemic infarcts involving the right thalamus and left paramedian pons as above. No associated hemorrhage or mass effect. 2. Underlying moderate chronic microvascular ischemic disease for age. Electronically Signed   By: Jeannine Boga M.D.   On: 01/30/2020 22:13   US Carotid Bilateral (at [facility] and [facility] only)  Result Date: 01/31/2020 CLINICAL DATA:  74 year old female with a history transient neurologic symptoms EXAM: BILATERAL CAROTID DUPLEX ULTRASOUND TECHNIQUE: Pearline Cables scale imaging, color Doppler and duplex ultrasound were performed of  bilateral carotid and vertebral arteries in the neck. COMPARISON:  None. FINDINGS: Criteria: Quantification of carotid stenosis is based on velocity parameters that correlate the residual internal carotid diameter with NASCET-based stenosis levels, using the diameter of the distal internal carotid lumen as the denominator for stenosis measurement. The following velocity measurements were obtained: RIGHT ICA:  Systolic 123XX123 cm/sec, Diastolic 19 cm/sec CCA:  69 cm/sec SYSTOLIC ICA/CCA RATIO:  1.8 ECA:  137 cm/sec LEFT ICA:  Systolic 73 cm/sec, Diastolic 16 cm/sec CCA:  69 cm/sec SYSTOLIC ICA/CCA RATIO:  1.1 ECA:  118 cm/sec Right Brachial SBP: Not acquired Left Brachial SBP: Not acquired RIGHT CAROTID ARTERY: No significant calcifications of the right common carotid artery. Intermediate waveform maintained. Moderate heterogeneous and partially calcified plaque at the right carotid bifurcation. No significant lumen shadowing. Low resistance waveform of the right ICA. No significant tortuosity. RIGHT VERTEBRAL ARTERY: Antegrade flow with low resistance waveform. LEFT CAROTID ARTERY: No significant calcifications of the left common carotid artery. Intermediate waveform maintained. Moderate heterogeneous and partially calcified plaque at the left carotid bifurcation. No significant lumen shadowing. Low resistance waveform of the left ICA. No significant tortuosity. LEFT VERTEBRAL ARTERY:  Antegrade flow with low resistance waveform. IMPRESSION: Color duplex indicates minimal heterogeneous and calcified plaque, with no hemodynamically significant stenosis by duplex criteria in the extracranial cerebrovascular circulation. Signed, Dulcy Fanny. Dellia Nims, RPVI [facility] Radiology Electronically Signed   By: Corrie Mckusick D.O.   On: 01/31/2020 09:18   DG Chest Port 1 View  Result Date: 01/30/2020 CLINICAL DATA:  Aphasia and bilateral weakness. EXAM: PORTABLE CHEST 1 VIEW  COMPARISON:  March 28, 2016 FINDINGS: A 3 mm well-defined calcified nodular opacity is seen overlying the lateral aspect of the mid right lung. This is not clearly seen on the prior study. There is no evidence of acute infiltrate, pleural effusion or pneumothorax. The heart size and mediastinal contours are within normal limits. There is mild calcification of the aortic arch. Multilevel degenerative changes seen throughout the thoracic spine. IMPRESSION: No active disease. Electronically Signed   By: Virgina Norfolk M.D.   On: 01/30/2020 21:10   ECHOCARDIOGRAM COMPLETE  Result Date: 01/31/2020    ECHOCARDIOGRAM REPORT   Patient Name:   ALYCEN GONYA Date of Exam: 01/31/2020 Medical Rec #:  [id number]      Height:       61.0 in Accession #:    [id number]     Weight:       155.0 lb Date of Birth:  03-Feb-1946      BSA:          1.695 m Patient Age:    83 years       BP:           176/73 mmHg Patient Gender: F  HR:           70 bpm. Exam Location:  [facility] Procedure: 2D Echo Indications:     435.9 TIA  History:         Patient has no prior history of Echocardiogram examinations.                  Risk Factors:Dyslipidemia, Diabetes and Hypertension.  Sonographer:     L Thornton-Maynard Referring Phys:  [id number] Athena Masse Diagnosing Phys: Kate Sable MD IMPRESSIONS  1. Left ventricular ejection fraction, by estimation, is 60 to 65%. The left ventricle has normal function. The left ventricle has no regional wall motion abnormalities. There is mild left ventricular hypertrophy. Left ventricular diastolic parameters were normal.  2. Right ventricular systolic function is normal. The right ventricular size is normal. There is normal pulmonary artery systolic pressure.  3. The mitral valve is grossly normal. Mild mitral valve regurgitation.  4. The aortic valve is tricuspid. Aortic valve regurgitation is not visualized. Mild aortic valve sclerosis is present, with no evidence of aortic valve stenosis.  5.  The inferior vena cava is normal in size with greater than 50% respiratory variability, suggesting right atrial pressure of 3 mmHg.  6. Evidence of atrial level shunting detected by color flow Doppler. FINDINGS  Left Ventricle: Left ventricular ejection fraction, by estimation, is 60 to 65%. The left ventricle has normal function. The left ventricle has no regional wall motion abnormalities. The left ventricular internal cavity size was normal in size. There is  mild left ventricular hypertrophy. Left ventricular diastolic parameters were normal. Right Ventricle: The right ventricular size is normal. No increase in right ventricular wall thickness. Right ventricular systolic function is normal. There is normal pulmonary artery systolic pressure. The tricuspid regurgitant velocity is 2.51 m/s, and  with an assumed right atrial pressure of 3 mmHg, the estimated right ventricular systolic pressure is Q000111Q mmHg. Left Atrium: Left atrial size was normal in size. Right Atrium: Right atrial size was normal in size. Pericardium: There is no evidence of pericardial effusion. Mitral Valve: The mitral valve is grossly normal. Mild mitral valve regurgitation. MV peak gradient, 3.9 mmHg. The mean mitral valve gradient is 2.0 mmHg. Tricuspid Valve: The tricuspid valve is normal in structure. Tricuspid valve regurgitation is trivial. Aortic Valve: The aortic valve is tricuspid. Aortic valve regurgitation is not visualized. Aortic regurgitation PHT measures 505 msec. Mild aortic valve sclerosis is present, with no evidence of aortic valve stenosis. Aortic valve mean gradient measures 3.0 mmHg. Aortic valve peak gradient measures 6.1 mmHg. Aortic valve area, by VTI measures 1.56 cm. Pulmonic Valve: The pulmonic valve was normal in structure. Pulmonic valve regurgitation is trivial. Aorta: The aortic root is normal in size and structure. Venous: The inferior vena cava is normal in size with greater than 50% respiratory variability,  suggesting right atrial pressure of 3 mmHg. IAS/Shunts: Evidence of atrial level shunting detected by color flow Doppler.  LEFT VENTRICLE PLAX 2D LVIDd:         4.16 cm     Diastology LVIDs:         2.59 cm     LV e' lateral:   5.55 cm/s LV PW:         1.19 cm     LV E/e' lateral: 14.6 LV IVS:        1.17 cm     LV e' medial:    5.33 cm/s LVOT diam:     1.90 cm  LV E/e' medial:  15.2 LV SV:         41 LV SV Index:   24 LVOT Area:     2.84 cm  LV Volumes (MOD) LV vol d, MOD A2C: 65.0 ml LV vol d, MOD A4C: 65.0 ml LV vol s, MOD A2C: 26.9 ml LV vol s, MOD A4C: 15.8 ml LV SV MOD A2C:     38.1 ml LV SV MOD A4C:     65.0 ml LV SV MOD BP:      46.3 ml RIGHT VENTRICLE RV S prime:     12.50 cm/s TAPSE (M-mode): 2.2 cm LEFT ATRIUM             Index LA diam:        3.50 cm 2.07 cm/m LA Vol (A2C):   33.6 ml 19.82 ml/m LA Vol (A4C):   46.2 ml 27.26 ml/m LA Biplane Vol: 39.6 ml 23.36 ml/m  AORTIC VALVE AV Area (Vmax):    1.53 cm AV Area (Vmean):   1.61 cm AV Area (VTI):     1.56 cm AV Vmax:           123.00 cm/s AV Vmean:          87.500 cm/s AV VTI:            0.263 m AV Peak Grad:      6.1 mmHg AV Mean Grad:      3.0 mmHg LVOT Vmax:         66.50 cm/s LVOT Vmean:        49.600 cm/s LVOT VTI:          0.145 m LVOT/AV VTI ratio: 0.55 AI PHT:            505 msec  AORTA Ao Root diam: 2.80 cm MITRAL VALVE               TRICUSPID VALVE MV Area (PHT): 4.23 cm    TR Peak grad:   25.2 mmHg MV Peak grad:  3.9 mmHg    TR Vmax:        251.00 cm/s MV Mean grad:  2.0 mmHg MV Vmax:       0.99 m/s    SHUNTS MV Vmean:      58.9 cm/s   Systemic VTI:  0.14 m MV E velocity: 81.00 cm/s  Systemic Diam: 1.90 cm MV A velocity: 88.30 cm/s MV E/A ratio:  0.92 Kate Sable MD Electronically signed by Kate Sable MD Signature Date/Time: 01/31/2020/3:35:31 PM    Final    CT HEAD CODE STROKE WO CONTRAST  Addendum Date: 01/30/2020   ADDENDUM REPORT: 01/30/2020 19:21 ADDENDUM: Study discussed by telephone with Dr. Jacqualine Code on 01/30/2020 at  1918 hours. Electronically Signed   By: Genevie Ann M.D.   On: 01/30/2020 19:21   Result Date: 01/30/2020 CLINICAL DATA:  Code stroke. 74 year old female with sudden onset slurred speech. EXAM: CT HEAD WITHOUT CONTRAST TECHNIQUE: Contiguous axial images were obtained from the base of the skull through the vertex without intravenous contrast. COMPARISON:  Head CT 03/28/2016. FINDINGS: Brain: Increased patchy bilateral white matter hypodensity since 2017, and there is a small new hypodensity in the right thalamus on series 2, image 12. Mild generalized volume loss since 2017. No midline shift, mass effect, or evidence of intracranial mass lesion. No acute intracranial hemorrhage identified. No ventriculomegaly. No cortically based acute infarct identified. No cortical encephalomalacia identified. Brainstem and cerebellum appear negative. Vascular: Extensive Calcified atherosclerosis at  the skull base. Distal right MCA calcified plaque suspected on series 5, image 11. No suspicious intracranial vascular hyperdensity. Skull: Chronic hyperostosis, normal variant. No acute osseous abnormality identified. Sinuses/Orbits: Visualized paranasal sinuses and mastoids are stable and well pneumatized. Other: Visualized orbits and scalp soft tissues are within normal limits. ASPECTS [facility] Stroke Program Early CT Score) Total score (0-10 with 10 being normal): 10 IMPRESSION: 1. Age indeterminate lacunar infarct in the right thalamus. Progressed cerebral white matter disease since 2017. 2. No acute cortically based infarct or acute intracranial hemorrhage identified. 3. ASPECTS 10. Electronically Signed: By: Genevie Ann M.D. On: 01/30/2020 19:11     Scheduled Meds: .  stroke: mapping our early stages of recovery book   Does not apply Once  . enoxaparin (LOVENOX) injection  40 mg Subcutaneous Q24H  . insulin aspart  0-15 Units Subcutaneous TID WC  . rosuvastatin  20 mg Oral q1800  . sodium chloride flush  3 mL Intravenous Q12H     Continuous Infusions:   LOS: 0 days     Enzo Bi, MD [facility] If 7PM-7AM, please contact night-coverage 01/31/2020, 9:19 PM

## 2020-01-31 NOTE — TOC Initial Note (Signed)
Transition of Care [facility]) - Initial/Assessment Note    Patient Details  Name: Sabrina [name] MRN: [id number] Date of Birth: August 15, 1946  Transition of Care [facility]) CM/SW Contact:    Eileen Stanford, LCSW Phone Number: 01/31/2020, 2:01 PM  Clinical Narrative:   CSW spoke with pt at bedside. Pt is alert and oriented. Pt's daughter also present at bedside. Pt states she lives with her daughter. Pt and pt's daughter are agreeable to [facility]- they did not have  Agency preference, list provided verbally. CSW will follow up with [facility] agencies to determine which will take pt. Pt will also need a rolling walker prior to d/c.                Expected Discharge Plan: [place] Barriers to Discharge: Continued Medical Work up   Patient Goals and CMS Choice Patient states their goals for this hospitalization and ongoing recovery are:: to go home      Expected Discharge Plan and Services Expected Discharge Plan: [place] In-house Referral: Clinical Social Work   Post Acute Care Choice: [place] arrangements for the past 2 months: [place]                 DME Arranged: [place] Rile DME Agency: [employer] Date DME Agency Contacted: 01/31/20 Time DME Agency Contacted: 88 Representative spoke with at DME Agency: Brad [place]: PT, OT [place] Agency: [facility] (formerly [employer]) Date [place]: 01/31/20 Time [place]: 1359 Representative spoke with at [place]: [place] Arrangements/Services Living arrangements for the past 2 months: [place] with:: Adult Children Patient language and need for interpreter reviewed:: Yes Do you feel safe going back to the place where you live?: Yes      Need for Family Participation in Patient Care: Yes (Comment) Care giver support system in place?: Yes (comment)   Criminal Activity/Legal Involvement Pertinent to Current Situation/Hospitalization: No  - Comment as needed  Activities of Daily Living Home Assistive Devices/Equipment: Cane (specify quad or straight) ADL Screening (condition at time of admission) Patient's cognitive ability adequate to safely complete daily activities?: Yes Is the patient deaf or have difficulty hearing?: No Does the patient have difficulty seeing, even when wearing glasses/contacts?: No Does the patient have difficulty concentrating, remembering, or making decisions?: No Patient able to express need for assistance with ADLs?: Yes Does the patient have difficulty dressing or bathing?: No Independently performs ADLs?: Yes (appropriate for developmental age) Does the patient have difficulty walking or climbing stairs?: No Weakness of Legs: None Weakness of Arms/Hands: None  Permission Sought/Granted Permission sought to share information with : Family Supports Permission granted to share information with : Yes, Verbal Permission Granted  Share Information with NAME: [name]  Permission granted to share info w AGENCY: [facility]  Permission granted to share info w Relationship: daughter     Emotional Assessment Appearance:: Appears stated age Attitude/Demeanor/Rapport: Engaged Affect (typically observed): Accepting, Appropriate, Calm Orientation: : Oriented to Self, Oriented to Place, Oriented to  Time, Oriented to Situation Alcohol / Substance Use: Not Applicable Psych Involvement: No (comment)  Admission diagnosis:  Hyperglycemia [R73.9] Episode of transient neurologic symptoms [R29.90] Neurological deficit, transient [R29.818] Brief resolved unexplained event ([place]) [R68.13] Acute CVA (cerebrovascular accident) [facility]) [I63.9] Patient Active Problem List   Diagnosis Date Noted  . Acute CVA (cerebrovascular accident) ([place]) 01/31/2020  . Episode of transient neurologic symptoms 01/30/2020  .  Hyperglycemia due to type 2 diabetes mellitus ([place]) 01/30/2020  . Incomplete uterine prolapse 12/08/2017  .  Cystocele, midline 12/08/2017  . Bipolar affective disorder, manic, severe, with psychotic behavior ([place])   . Diabetes ([place]) 04/01/2016  . Dyslipidemia 04/01/2016  . Brief psychotic disorder ([place]) 03/28/2016  . Noncompliance 03/28/2016  . Essential hypertension 03/09/2016   PCP:  [name], MD Pharmacy:   [facility] [place], [place] - [address] AT [facility] [address] [place] [address] Phone: [phone] Fax: [phone]     Social Determinants of Health (SDOH) Interventions    Readmission Risk Interventions No flowsheet data found.

## 2020-01-31 NOTE — Progress Notes (Addendum)
On call APP B. Randol Kern notified of patient's elevated BP via secure chat. Pt reports that she has not been given her usual BP meds, Spironolatone, Imdur, or Lotensin. No scheduled or prn BP meds on file. Bp rechecked: 176/81. Pt without complaints other than chronic wrist pain.   10:10 pm: Patient was given Benazepril 20 mg as ordered. Also received orders for prn hydralazine; Does not meet parameter for prn hydralazine.

## 2020-01-31 NOTE — Progress Notes (Signed)
Inpatient Diabetes Program Recommendations  AACE/ADA: New Consensus Statement on Inpatient Glycemic Control (2015)  Target Ranges:  Prepandial:   less than 140 mg/dL      Peak postprandial:   less than 180 mg/dL (1-2 hours)      Critically ill patients:  140 - 180 mg/dL   Lab Results  Component Value Date   GLUCAP 154 (H) 01/31/2020   HGBA1C 10.2 (H) 03/31/2016    Review of Glycemic Control Results for ANTWAN, BRIBIESCA (MRN [id number]) as of 01/31/2020 13:48  Ref. Range 01/30/2020 22:40 01/30/2020 23:58 01/31/2020 04:26 01/31/2020 07:25 01/31/2020 11:45  Glucose-Capillary Latest Ref Range: 70 - 99 mg/dL 311 (H) 236 (H) 220 (H) 234 (H) 154 (H)   Diabetes history: DM2 Outpatient Diabetes medications: Metformin 1 gm qd Current orders for Inpatient glycemic control: Novolog moderate correction tid  Inpatient Diabetes Program Recommendations:   Spoke with patient to clarify if patient was currently taking insulin. Patient has been on Levemir in the past, but no longer takes due to "having trouble with her legs". States her glucose meter is broken and needs a new meter so has not been checking CBGs. Patient has appointment with "Dr. Pasty Arch" May 5th for evaluation of her diabetes management.  On Discharge: Glucose Monitoring Kit -# [id number]  Thank you, Nani Gasser. Shantika Bermea, RN, MSN, CDE  Diabetes Coordinator Inpatient Glycemic Control Team Team Pager [phone] (8am-5pm) 01/31/2020 2:04 PM

## 2020-01-31 NOTE — Evaluation (Addendum)
Physical Therapy Evaluation Patient Details Name: Sabrina [name] MRN: [id number] DOB: 03/13/1946 Today's Date: 01/31/2020   History of Present Illness  Pt admitted for transient neurologic symptoms with complaints of brief neurologic episode while driving grandchildren home and had slurred speech, confusion, and was combative. 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 Other PMH includes DM, HTN, and anxiety.   Clinical Impression  Pt is a pleasant 74 year old female who was admitted for CVA. Pt performs bed mobility/transfers with min assist and ambulation with cga and RW. Pt with poor participation in coordination testing, B UE/LE slow in rapid movements. Pt demonstrates deficits with strength (B UE/LE grossly symmetrical; question poor effort), balance/mobility. At this time, pt requires 2 handed support with use of RW. No sensation deficits noted. Speech appears baseline. Pt reports she "just doesn't feel right". No dizziness noted, however pt wishes to go ahead and bathe while in bathroom so declines further ambulation distance at this time. Physically has strength/balance to ambulate out in hallway. Would benefit from skilled PT to address above deficits and promote optimal return to PLOF. Recommend transition to [facility] upon discharge from acute hospitalization.     Follow Up Recommendations Home health PT;Supervision/Assistance - 24 hour    Equipment Recommendations  Rolling walker with 5" wheels;3in1 (PT)(grab bars)    Recommendations for Other Services       Precautions / Restrictions Precautions Precautions: Fall Restrictions Weight Bearing Restrictions: No      Mobility  Bed Mobility Overal bed mobility: Needs Assistance Bed Mobility: Supine to Sit     Supine to sit: Min assist     General bed mobility comments: needs slight assist for trunk stability, once seated able to sit with upright posture with supervision  Transfers Overall transfer level: Needs  assistance Equipment used: Rolling walker (2 wheeled) Transfers: Sit to/from Stand Sit to Stand: Min assist         General transfer comment: needs assist for anterior translation. Once standing, upright posture noted. RW education given  Ambulation/Gait Ambulation/Gait assistance: Min guard Gait Distance (Feet): 10 Feet Assistive device: Rolling walker (2 wheeled) Gait Pattern/deviations: Step-through pattern     General Gait Details: slow, but steady gait over to bathroom. Pt requesting to stay in bathroom to get bath therefore further distance terminated at this time. NA in room to assist with bathing.  Stairs            Wheelchair Mobility    Modified Rankin (Stroke Patients Only)       Balance Overall balance assessment: Needs assistance;History of Falls Sitting-balance support: Feet supported Sitting balance-Leahy Scale: Good     Standing balance support: Bilateral upper extremity supported Standing balance-Leahy Scale: Good                               Pertinent Vitals/Pain Pain Assessment: No/denies pain    Home Living Family/patient expects to be discharged to:: Private residence Living Arrangements: Children Available Help at Discharge: Family(has multiple family members available to assist) Type of Home: House Home Access: Stairs to enter Entrance Stairs-Rails: None Entrance Stairs-Number of Steps: 2 Home Layout: Two level;Able to live on main level with bedroom/bathroom Home Equipment: Cane - single point Additional Comments: pt very adament that she has no DME and is wishing for grab bars, RW, etc    Prior Function Level of Independence: Independent with assistive device(s)  Comments: reports she recently bought [facility] and has been using as needed. Reports multiple falls recently.     Hand Dominance        Extremity/Trunk Assessment   Upper Extremity Assessment Upper Extremity Assessment: Generalized weakness(B  UE grossly 3/5; giveaway weakness noted)    Lower Extremity Assessment Lower Extremity Assessment: Generalized weakness(grossly 4/5; giveaway weakeness present)       Communication   Communication: No difficulties  Cognition Arousal/Alertness: Awake/alert Behavior During Therapy: WFL for tasks assessed/performed Overall Cognitive Status: Within Functional Limits for tasks assessed                                 General Comments: very pleasant and conversational. Starts singing gospel music intermittently      General Comments      Exercises Other Exercises Other Exercises: Pt ambulated to bathroom with safe technique and cga. Uses railing for transfer down to seat. No dizziness with mobility efforts.   Assessment/Plan    PT Assessment Patient needs continued PT services  PT Problem List Decreased strength;Decreased activity tolerance;Decreased balance;Decreased mobility       PT Treatment Interventions DME instruction;Gait training;Stair training;Therapeutic exercise;Balance training    PT Goals (Current goals can be found in the Care Plan section)  Acute Rehab PT Goals Patient Stated Goal: to go home PT Goal Formulation: With patient Time For Goal Achievement: 02/14/20 Potential to Achieve Goals: Good    Frequency 7X/week   Barriers to discharge        Co-evaluation               AM-PAC PT "6 Clicks" Mobility  Outcome Measure Help needed turning from your back to your side while in a flat bed without using bedrails?: A Little Help needed moving from lying on your back to sitting on the side of a flat bed without using bedrails?: A Little Help needed moving to and from a bed to a chair (including a wheelchair)?: A Little Help needed standing up from a chair using your arms (e.g., wheelchair or bedside chair)?: A Little Help needed to walk in hospital room?: A Little Help needed climbing 3-5 steps with a railing? : A Lot 6 Click Score:  17    End of Session Equipment Utilized During Treatment: Gait belt Activity Tolerance: Patient tolerated treatment well Patient left: (left in bathroom with CNA) Nurse Communication: Mobility status PT Visit Diagnosis: Unsteadiness on feet (R26.81);Muscle weakness (generalized) (M62.81);History of falling (Z91.81);Difficulty in walking, not elsewhere classified (R26.2)    Time: [id number] PT Time Calculation (min) (ACUTE ONLY): 27 min   Charges:   PT Evaluation $PT Eval Moderate Complexity: 1 Mod PT Treatments $Therapeutic Activity: 8-22 mins        Greggory Stallion, PT, DPT [phone]   Allen Egerton 01/31/2020, 1:18 PM

## 2020-02-01 DIAGNOSIS — I639 Cerebral infarction, unspecified: Secondary | ICD-10-CM

## 2020-02-01 LAB — CBC
HCT: 40 % (ref 36.0–46.0)
Hemoglobin: 13.3 g/dL (ref 12.0–15.0)
MCH: 29.6 pg (ref 26.0–34.0)
MCHC: 33.3 g/dL (ref 30.0–36.0)
MCV: 89.1 fL (ref 80.0–100.0)
Platelets: 316 10*3/uL (ref 150–400)
RBC: 4.49 MIL/uL (ref 3.87–5.11)
RDW: 12.5 % (ref 11.5–15.5)
WBC: 5.9 10*3/uL (ref 4.0–10.5)
nRBC: 0 % (ref 0.0–0.2)

## 2020-02-01 LAB — BASIC METABOLIC PANEL
Anion gap: 10 (ref 5–15)
BUN: 17 mg/dL (ref 8–23)
CO2: 27 mmol/L (ref 22–32)
Calcium: 9.3 mg/dL (ref 8.9–10.3)
Chloride: 102 mmol/L (ref 98–111)
Creatinine, Ser: 0.71 mg/dL (ref 0.44–1.00)
GFR calc Af Amer: >60 mL/min (ref >60)
GFR calc non Af Amer: >60 mL/min (ref >60)
Glucose, Bld: 227 mg/dL — ABNORMAL HIGH (ref 70–99)
Potassium: 4.1 mmol/L (ref 3.5–5.1)
Sodium: 139 mmol/L (ref 135–145)

## 2020-02-01 LAB — HEMOGLOBIN A1C
Hgb A1c MFr Bld: 12.7 % — ABNORMAL HIGH (ref 4.8–5.6)
Hgb A1c MFr Bld: 12.7 % — ABNORMAL HIGH (ref 4.8–5.6)
Mean Plasma Glucose: 318 mg/dL
Mean Plasma Glucose: 318 mg/dL

## 2020-02-01 LAB — GLUCOSE, CAPILLARY
Glucose-Capillary: 209 mg/dL — ABNORMAL HIGH (ref 70–99)
Glucose-Capillary: 211 mg/dL — ABNORMAL HIGH (ref 70–99)

## 2020-02-01 LAB — MAGNESIUM: Magnesium: 2 mg/dL (ref 1.7–2.4)

## 2020-02-01 MED ORDER — ASPIRIN EC 325 MG PO TBEC: 325.0000 mg | DELAYED_RELEASE_TABLET | Freq: Every day | ORAL | 3 refills | Status: DC

## 2020-02-01 MED ORDER — AMLODIPINE BESYLATE 5 MG PO TABS: 5.0000 mg | ORAL_TABLET | Freq: Every day | ORAL | 11 refills | Status: DC

## 2020-02-01 MED ORDER — ROSUVASTATIN CALCIUM 40 MG PO TABS: 20.0000 mg | ORAL_TABLET | Freq: Every day | ORAL | 3 refills | Status: DC

## 2020-02-01 NOTE — Progress Notes (Signed)
Subjective: Improved noted to be sitting on side of bed.    Past Medical History:  Diagnosis Date  . Diabetes mellitus without complication ([place])   . High cholesterol   . Hypertension     Past Surgical History:  Procedure Laterality Date  . DILATION AND CURETTAGE OF UTERUS    . POLYPECTOMY      Family History  Problem Relation Age of Onset  . Breast cancer Maternal Aunt        2 aunts in their 47's  . Breast cancer Maternal Aunt 70  . Ovarian cancer Neg Hx   . Colon cancer Neg Hx     Social History:  reports that she has never smoked. She has never used smokeless tobacco. She reports that she does not drink alcohol or use drugs.  Allergies  Allergen Reactions  . Glimepiride Diarrhea and Other (See Comments)  . Glipizide Other (See Comments)  . Lisinopril Other (See Comments)    headache  . Losartan Other (See Comments)    Headache  . Norco [Hydrocodone-Acetaminophen] Other (See Comments)    Mood changes  . Pioglitazone Swelling    On legs    Medications: I have reviewed the patient's current medications.  Physical Examination: Blood pressure 135/64, pulse 81, temperature 98.2 F (36.8 C), temperature source Oral, resp. rate 18, height 5\' 1"  (1.549 m), weight 70.1 kg, SpO2 100 %.    Neurological Examination   Mental Status: Alert, oriented, thought content appropriate.  Speech fluent without evidence of aphasia.  Able to follow 3 step commands without difficulty. Cranial Nerves: II: Discs flat bilaterally; Visual fields grossly normal, pupils equal, round, reactive to light and accommodation III,IV, VI: ptosis not present, extra-ocular motions intact bilaterally V,VII: smile symmetric, facial light touch sensation normal bilaterally VIII: hearing normal bilaterally IX,X: gag reflex present XI: bilateral shoulder shrug XII: midline tongue extension Motor: Generalized weakness but symmetrical  Sensory: Pinprick and light touch intact throughout,  bilaterally Deep Tendon Reflexes: 1+ and symmetric throughout Plantars: Right: downgoing   Left: downgoing Cerebellar: normal finger-to-nose      Laboratory Studies:   Basic Metabolic Panel: Recent Labs  Lab 01/30/20 1855 01/31/20 0959 02/01/20 0437  NA 137 138 139  K 4.1 3.6 4.1  CL 101 104 102  CO2 23 26 27   GLUCOSE 374* 202* 227*  BUN 21 15 17   CREATININE 0.99 0.80 0.71  CALCIUM 9.6 8.9 9.3  MG  --  1.9 2.0    Liver Function Tests: Recent Labs  Lab 01/30/20 1855  AST 23  ALT 21  ALKPHOS 72  BILITOT 0.6  PROT 7.1  ALBUMIN 4.1   No results for input(s): LIPASE, AMYLASE in the last 168 hours. No results for input(s): AMMONIA in the last 168 hours.  CBC: Recent Labs  Lab 01/30/20 1855 01/31/20 0959 02/01/20 0437  WBC 6.7 4.9 5.9  NEUTROABS 2.7  --   --   HGB 12.8 12.6 13.3  HCT 38.5 39.0 40.0  MCV 89.1 91.3 89.1  PLT 337 309 316    Cardiac Enzymes: No results for input(s): CKTOTAL, CKMB, CKMBINDEX, TROPONINI in the last 168 hours.  BNP: Invalid input(s): POCBNP  CBG: Recent Labs  Lab 01/31/20 1145 01/31/20 1642 01/31/20 2047 02/01/20 0728 02/01/20 1111  GLUCAP 154* 171* 193* 209* 211*    Microbiology: No results found for this or any previous visit.  Coagulation Studies: Recent Labs    01/30/20 1855  LABPROT 12.8  INR 1.0  Urinalysis: No results for input(s): COLORURINE, LABSPEC, PHURINE, GLUCOSEU, HGBUR, BILIRUBINUR, KETONESUR, PROTEINUR, UROBILINOGEN, NITRITE, LEUKOCYTESUR in the last 168 hours.  Invalid input(s): APPERANCEUR  Lipid Panel:     Component Value Date/Time   CHOL 163 01/31/2020 0434   TRIG 289 (H) 01/31/2020 0434   HDL 39 (L) 01/31/2020 0434   CHOLHDL 4.2 01/31/2020 0434   VLDL 58 (H) 01/31/2020 0434   LDLCALC 66 01/31/2020 0434    HgbA1C:  Lab Results  Component Value Date   HGBA1C 12.7 (H) 01/31/2020    Urine Drug Screen:      Component Value Date/Time   LABOPIA NONE DETECTED 04/01/2016 1100    COCAINSCRNUR NONE DETECTED 04/01/2016 1100   LABBENZ NONE DETECTED 04/01/2016 1100   AMPHETMU NONE DETECTED 04/01/2016 1100   THCU NONE DETECTED 04/01/2016 1100   LABBARB NONE DETECTED 04/01/2016 1100    Alcohol Level: No results for input(s): ETH in the last 168 hours.  Other results: EKG: normal EKG, normal sinus rhythm, unchanged from previous tracings.  Imaging: MR BRAIN WO CONTRAST  Result Date: 01/30/2020 CLINICAL DATA:  Initial evaluation for acute slurred speech, generalized weakness. EXAM: MRI HEAD WITHOUT CONTRAST TECHNIQUE: Multiplanar, multiecho pulse sequences of the brain and surrounding structures were obtained without intravenous contrast. COMPARISON:  Prior head CT from earlier the same day. FINDINGS: Brain: Generalized age-related cerebral atrophy. Patchy T2/FLAIR hyperintensity within the periventricular deep white matter both cerebral hemispheres most consistent with chronic small vessel ischemic disease, moderate in nature. Approximate 6 mm focus of restricted diffusion involving the right thalamus consistent with an acute ischemic infarct (series 5, images 22, 21). Additional patchy diffusion abnormality measuring up to 14 mm in length seen involving the left paramedian pons (series 5, image 16), consistent with an acute ischemic infarct as well. No associated hemorrhage or mass effect. No other evidence for acute or subacute ischemia. No encephalomalacia to suggest chronic cortical infarction elsewhere within the brain. No foci of susceptibility artifact to suggest acute or chronic intracranial hemorrhage. No mass lesion, midline shift or mass effect. No hydrocephalus or extra-axial fluid collection. Pituitary gland suprasellar region normal. Midline structures intact. Vascular: Major intracranial vascular flow voids are maintained. Skull and upper cervical spine: Craniocervical junction within normal limits. Upper cervical spine normal. Bone marrow signal intensity within  normal limits. No scalp soft tissue abnormality. Sinuses/Orbits: Globes and orbital soft tissues within normal limits. Patient status post bilateral ocular lens replacement. Paranasal sinuses are largely clear. No significant mastoid effusion. Inner ear structures grossly normal. Other: None. IMPRESSION: 1. Small acute ischemic infarcts involving the right thalamus and left paramedian pons as above. No associated hemorrhage or mass effect. 2. Underlying moderate chronic microvascular ischemic disease for age. Electronically Signed   By: Jeannine Boga M.D.   On: 01/30/2020 22:13   US Carotid Bilateral (at [facility] and [facility] only)  Result Date: 01/31/2020 CLINICAL DATA:  74 year old female with a history transient neurologic symptoms EXAM: BILATERAL CAROTID DUPLEX ULTRASOUND TECHNIQUE: Pearline Cables scale imaging, color Doppler and duplex ultrasound were performed of bilateral carotid and vertebral arteries in the neck. COMPARISON:  None. FINDINGS: Criteria: Quantification of carotid stenosis is based on velocity parameters that correlate the residual internal carotid diameter with NASCET-based stenosis levels, using the diameter of the distal internal carotid lumen as the denominator for stenosis measurement. The following velocity measurements were obtained: RIGHT ICA:  Systolic 123XX123 cm/sec, Diastolic 19 cm/sec CCA:  69 cm/sec SYSTOLIC ICA/CCA RATIO:  1.8 ECA:  137 cm/sec LEFT ICA:  Systolic 73 cm/sec, Diastolic 16 cm/sec CCA:  69 cm/sec SYSTOLIC ICA/CCA RATIO:  1.1 ECA:  118 cm/sec Right Brachial SBP: Not acquired Left Brachial SBP: Not acquired RIGHT CAROTID ARTERY: No significant calcifications of the right common carotid artery. Intermediate waveform maintained. Moderate heterogeneous and partially calcified plaque at the right carotid bifurcation. No significant lumen shadowing. Low resistance waveform of the right ICA. No significant tortuosity. RIGHT VERTEBRAL ARTERY: Antegrade flow with low resistance waveform.  LEFT CAROTID ARTERY: No significant calcifications of the left common carotid artery. Intermediate waveform maintained. Moderate heterogeneous and partially calcified plaque at the left carotid bifurcation. No significant lumen shadowing. Low resistance waveform of the left ICA. No significant tortuosity. LEFT VERTEBRAL ARTERY:  Antegrade flow with low resistance waveform. IMPRESSION: Color duplex indicates minimal heterogeneous and calcified plaque, with no hemodynamically significant stenosis by duplex criteria in the extracranial cerebrovascular circulation. Signed, Dulcy Fanny. Dellia Nims, RPVI [facility] Electronically Signed   By: Corrie Mckusick D.O.   On: 01/31/2020 09:18   DG Chest Port 1 View  Result Date: 01/30/2020 CLINICAL DATA:  Aphasia and bilateral weakness. EXAM: PORTABLE CHEST 1 VIEW COMPARISON:  March 28, 2016 FINDINGS: A 3 mm well-defined calcified nodular opacity is seen overlying the lateral aspect of the mid right lung. This is not clearly seen on the prior study. There is no evidence of acute infiltrate, pleural effusion or pneumothorax. The heart size and mediastinal contours are within normal limits. There is mild calcification of the aortic arch. Multilevel degenerative changes seen throughout the thoracic spine. IMPRESSION: No active disease. Electronically Signed   By: Virgina Norfolk M.D.   On: 01/30/2020 21:10   ECHOCARDIOGRAM COMPLETE  Result Date: 01/31/2020    ECHOCARDIOGRAM REPORT   Patient Name:   MADALIN QUILLIN Date of Exam: 01/31/2020 Medical Rec #:  [id number]      Height:       61.0 in Accession #:    [id number]     Weight:       155.0 lb Date of Birth:  05-15-1946      BSA:          1.695 m Patient Age:    36 years       BP:           176/73 mmHg Patient Gender: F              HR:           70 bpm. Exam Location:  [facility] Procedure: 2D Echo Indications:     435.9 TIA  History:         Patient has no prior history of  Echocardiogram examinations.                  Risk Factors:Dyslipidemia, Diabetes and Hypertension.  Sonographer:     L Thornton-Maynard Referring Phys:  [id number] Athena Masse Diagnosing Phys: Kate Sable MD IMPRESSIONS  1. Left ventricular ejection fraction, by estimation, is 60 to 65%. The left ventricle has normal function. The left ventricle has no regional wall motion abnormalities. There is mild left ventricular hypertrophy. Left ventricular diastolic parameters were normal.  2. Right ventricular systolic function is normal. The right ventricular size is normal. There is normal pulmonary artery systolic pressure.  3. The mitral valve is grossly normal. Mild mitral valve regurgitation.  4. The aortic valve is tricuspid. Aortic valve regurgitation is not visualized. Mild aortic valve sclerosis is present, with no evidence  of aortic valve stenosis.  5. The inferior vena cava is normal in size with greater than 50% respiratory variability, suggesting right atrial pressure of 3 mmHg.  6. Evidence of atrial level shunting detected by color flow Doppler. FINDINGS  Left Ventricle: Left ventricular ejection fraction, by estimation, is 60 to 65%. The left ventricle has normal function. The left ventricle has no regional wall motion abnormalities. The left ventricular internal cavity size was normal in size. There is  mild left ventricular hypertrophy. Left ventricular diastolic parameters were normal. Right Ventricle: The right ventricular size is normal. No increase in right ventricular wall thickness. Right ventricular systolic function is normal. There is normal pulmonary artery systolic pressure. The tricuspid regurgitant velocity is 2.51 m/s, and  with an assumed right atrial pressure of 3 mmHg, the estimated right ventricular systolic pressure is [id number] mmHg. Left Atrium: Left atrial size was normal in size. Right Atrium: Right atrial size was normal in size. Pericardium: There is no evidence of pericardial  effusion. Mitral Valve: The mitral valve is grossly normal. Mild mitral valve regurgitation. MV peak gradient, 3.9 mmHg. The mean mitral valve gradient is 2.0 mmHg. Tricuspid Valve: The tricuspid valve is normal in structure. Tricuspid valve regurgitation is trivial. Aortic Valve: The aortic valve is tricuspid. Aortic valve regurgitation is not visualized. Aortic regurgitation PHT measures 505 msec. Mild aortic valve sclerosis is present, with no evidence of aortic valve stenosis. Aortic valve mean gradient measures 3.0 mmHg. Aortic valve peak gradient measures 6.1 mmHg. Aortic valve area, by VTI measures 1.56 cm. Pulmonic Valve: The pulmonic valve was normal in structure. Pulmonic valve regurgitation is trivial. Aorta: The aortic root is normal in size and structure. Venous: The inferior vena cava is normal in size with greater than 50% respiratory variability, suggesting right atrial pressure of 3 mmHg. IAS/Shunts: Evidence of atrial level shunting detected by color flow Doppler.  LEFT VENTRICLE PLAX 2D LVIDd:         4.16 cm     Diastology LVIDs:         2.59 cm     LV e' lateral:   5.55 cm/s LV PW:         1.19 cm     LV E/e' lateral: 14.6 LV IVS:        1.17 cm     LV e' medial:    5.33 cm/s LVOT diam:     1.90 cm     LV E/e' medial:  15.2 LV SV:         41 LV SV Index:   24 LVOT Area:     2.84 cm  LV Volumes (MOD) LV vol d, MOD A2C: 65.0 ml LV vol d, MOD A4C: 65.0 ml LV vol s, MOD A2C: 26.9 ml LV vol s, MOD A4C: 15.8 ml LV SV MOD A2C:     38.1 ml LV SV MOD A4C:     65.0 ml LV SV MOD BP:      46.3 ml RIGHT VENTRICLE RV S prime:     12.50 cm/s TAPSE (M-mode): 2.2 cm LEFT ATRIUM             Index LA diam:        3.50 cm 2.07 cm/m LA Vol (A2C):   33.6 ml 19.82 ml/m LA Vol (A4C):   46.2 ml 27.26 ml/m LA Biplane Vol: 39.6 ml 23.36 ml/m  AORTIC VALVE AV Area (Vmax):    1.53 cm AV Area (Vmean):   1.61 cm  AV Area (VTI):     1.56 cm AV Vmax:           123.00 cm/s AV Vmean:          87.500 cm/s AV VTI:             0.263 m AV Peak Grad:      6.1 mmHg AV Mean Grad:      3.0 mmHg LVOT Vmax:         66.50 cm/s LVOT Vmean:        49.600 cm/s LVOT VTI:          0.145 m LVOT/AV VTI ratio: 0.55 AI PHT:            505 msec  AORTA Ao Root diam: 2.80 cm MITRAL VALVE               TRICUSPID VALVE MV Area (PHT): 4.23 cm    TR Peak grad:   25.2 mmHg MV Peak grad:  3.9 mmHg    TR Vmax:        251.00 cm/s MV Mean grad:  2.0 mmHg MV Vmax:       0.99 m/s    SHUNTS MV Vmean:      58.9 cm/s   Systemic VTI:  0.14 m MV E velocity: 81.00 cm/s  Systemic Diam: 1.90 cm MV A velocity: 88.30 cm/s MV E/A ratio:  0.92 Kate Sable MD Electronically signed by Kate Sable MD Signature Date/Time: 01/31/2020/3:35:31 PM    Final    CT HEAD CODE STROKE WO CONTRAST  Addendum Date: 01/30/2020   ADDENDUM REPORT: 01/30/2020 19:21 ADDENDUM: Study discussed by telephone with Dr. Jacqualine Code on 01/30/2020 at 1918 hours. Electronically Signed   By: Genevie Ann M.D.   On: 01/30/2020 19:21   Result Date: 01/30/2020 CLINICAL DATA:  Code stroke. 74 year old female with sudden onset slurred speech. EXAM: CT HEAD WITHOUT CONTRAST TECHNIQUE: Contiguous axial images were obtained from the base of the skull through the vertex without intravenous contrast. COMPARISON:  Head CT 03/28/2016. FINDINGS: Brain: Increased patchy bilateral white matter hypodensity since 2017, and there is a small new hypodensity in the right thalamus on series 2, image 12. Mild generalized volume loss since 2017. No midline shift, mass effect, or evidence of intracranial mass lesion. No acute intracranial hemorrhage identified. No ventriculomegaly. No cortically based acute infarct identified. No cortical encephalomalacia identified. Brainstem and cerebellum appear negative. Vascular: Extensive Calcified atherosclerosis at the skull base. Distal right MCA calcified plaque suspected on series 5, image 11. No suspicious intracranial vascular hyperdensity. Skull: Chronic hyperostosis, normal variant. No  acute osseous abnormality identified. Sinuses/Orbits: Visualized paranasal sinuses and mastoids are stable and well pneumatized. Other: Visualized orbits and scalp soft tissues are within normal limits. ASPECTS [facility] Stroke Program Early CT Score) Total score (0-10 with 10 being normal): 10 IMPRESSION: 1. Age indeterminate lacunar infarct in the right thalamus. Progressed cerebral white matter disease since 2017. 2. No acute cortically based infarct or acute intracranial hemorrhage identified. 3. ASPECTS 10. Electronically Signed: By: Genevie Ann M.D. On: 01/30/2020 19:11     Assessment/Plan:   75 y.o. female medical history significant for diabetes and hypertension, as well as anxiety with history of a brief psychiatric admission in 2017, who was brought to the emergency room as a code stroke after she experienced a brief neurologic episode yesterday and currently back to baseline.  Pt found to have R thalamic and paramedian strokes  - Patient is hypertensive, strokes likely  in setting of small vessel disease from posterior circulation perforators  - pt/ot appreciated. Needs 24 hr supervision  - ASA 325 daily -d/c planning from neurological stand point  02/01/2020, 11:25 AM

## 2020-02-01 NOTE — TOC Transition Note (Signed)
Transition of Care [facility]) - CM/SW Discharge Note   Patient Details  Name: LEZLEE EKBERG MRN: [id number] Date of Birth: [birth year]  [facility] CM/SW Contact:  Eileen Stanford, LCSW Phone Number: 02/01/2020, 11:48 AM   Clinical Narrative: HH is arranged. Pt has a walker at bedside. Outpatient speech referral faxed to [place]. Pt's daughter will transport pt home.NO additional needs at this time.      Final next level of care: Home w Home Health Services Barriers to Discharge: No Barriers Identified   Patient Goals and CMS Choice Patient states their goals for this hospitalization and ongoing recovery are:: to go home      Discharge Placement                  Name of family member notified: daughter present at bedside Patient and family notified of of transfer: 02/01/20  Discharge Plan and Services In-house Referral: Clinical Social Work   Post Acute Care Choice: Home Health          DME Arranged: [place]ford Rile DME Agency: [employer] Date DME Agency Contacted: 01/31/20 Time DME Agency Contacted: [id number] Representative spoke with at DME Agency: [place]: PT, OT, Speech Therapy [place] Agency: [facility] (formerly [employer]) Date [place]: 02/01/20 Time [place]: 1146 Representative spoke with at [place]: [place] ([place]) Interventions     Readmission Risk Interventions No flowsheet data found.

## 2020-02-01 NOTE — Discharge Summary (Signed)
Physician Discharge Summary  Sabrina [name] [id number] DOB: 01/14/1946 DOA: 01/30/2020  PCP: [name], MD  Admit date: 01/30/2020 Discharge date: 02/01/2020  Admitted From: Home  Disposition:  Home   Recommendations for Outpatient Follow-up:  1. Follow up with PCP Dr. Doy Hutching in 1-2 weeks 2. Follow up with Neurology Dr. Manuella Ghazi in 4 weeks   Home Health: PT/OT due to ongoing dizziness and difficulty ambulating without assistance  Equipment/Devices: walker  Discharge Condition: Fair  CODE STATUS: FULL Diet recommendation: Diabetic, Cardiac  Brief/Interim Summary: Sabrina [name] a 74 y.o.AA femalewith medical history significant fordiabetes and hypertension, as well as anxiety with history of abriefpsychiatric admission in 2017, who was brought to the emergency room as a code stroke after she experienced a brief neurologic episode earlier in the afternoonwhile was driving her grandchildren home from school. According to the daughtershe hadslurred speech, confusion, became combative and defecated on herself. She was brought to the emergency room by which time symptoms had resolved and she was back to near normal but continued to have intermittent slurred unintelligible speechand to have generalized weakness.     PRINCIPAL HOSPITAL DIAGNOSIS: Acute stroke    Discharge Diagnoses:   Acute R thalamic and paramedian strokes MRI showed acute right thalamic and paramedian strokes.  Evaluated by Neurology who felt these were likely small vessel disease due to HTN.   Symptoms resolved.    Echocardiogram showed no cardiogenic source of embolism.  Possible atrial shunting. Carotid US showed no significant stenosis.  LDL 66.  Discharged on aspirin 325, statin dose increased.      Hyperglycemia due to type 2 diabetes mellitus (HCC)  Essential hypertension  History of anxiety  Chronic diarrhea           Discharge Instructions  Discharge Instructions     Diet - low sodium heart healthy   Complete by: As directed    Discharge instructions   Complete by: As directed    From Dr. Loleta Books: You were admitted for 2 small strokes. Thankfully, these did not cause severe damage. Your testing here showed no serious conditions that might cause imminent further strokes. The best way to reduce the chance of further strokes is by: -taking aspirin -controlling your blood pressure strictly -controlling your cholesterol with Crestor -controlling your blood sugars   With that in mind: STOP taking aspirin 81 mg daily and START taking aspirin 325 mg daily Go see a Neurologist, Dr. Manuella Ghazi in 1 month (his phone number is listed here)  INCREASE your rosuvastatin/Crestor dose to 40 mg nightly Have Dr. Doy Hutching check your cholesterol in 3 months  Continue your blood pressure medicines: Spironolactone Imdur Benazepril  START the new blood pressure medicine:  Amlodipine 5 mg daily  Go see the endocrinologist recommended by Dr. Doy Hutching  Go see Dr. Doy Hutching in 1 week   Increase activity slowly   Complete by: As directed      Allergies as of 02/01/2020      Reactions   Glimepiride Diarrhea, Other (See Comments)   Glipizide Other (See Comments)   Lisinopril Other (See Comments)   headache   Losartan Other (See Comments)   Headache   Norco [hydrocodone-acetaminophen] Other (See Comments)   Mood changes   Pioglitazone Swelling   On legs      Medication List    TAKE these medications   amLODipine 5 MG tablet Commonly known as: NORVASC Take 1 tablet (5 mg total) by mouth daily.   aspirin EC 325 MG  tablet Take 1 tablet (325 mg total) by mouth daily. What changed:   medication strength  how much to take   benazepril 20 MG tablet Commonly known as: LOTENSIN Take 20 mg by mouth 2 (two) times daily.   isosorbide mononitrate 60 MG 24 hr tablet Commonly known as: IMDUR Take 60 mg by mouth 2 (two) times daily.   rosuvastatin 40 MG  tablet Commonly known as: CRESTOR Take 0.5 tablets (20 mg total) by mouth at bedtime. What changed: medication strength   spironolactone 25 MG tablet Commonly known as: ALDACTONE Take 25 mg by mouth daily.      Follow-up Information    [name], MD. Schedule an appointment as soon as possible for a visit in 1 week(s).   Specialty: Internal Medicine Contact information: [place] [address] [phone]        Vladimir Crofts, MD. Schedule an appointment as soon as possible for a visit in 4 week(s).   Specialty: Neurology Contact information: [facility] [place] [address] [phone]          Allergies  Allergen Reactions  . Glimepiride Diarrhea and Other (See Comments)  . Glipizide Other (See Comments)  . Lisinopril Other (See Comments)    headache  . Losartan Other (See Comments)    Headache  . Norco [Hydrocodone-Acetaminophen] Other (See Comments)    Mood changes  . Pioglitazone Swelling    On legs    Consultations:  Neurology   Procedures/Studies: MR BRAIN WO CONTRAST  Result Date: 01/30/2020 CLINICAL DATA:  Initial evaluation for acute slurred speech, generalized weakness. EXAM: MRI HEAD WITHOUT CONTRAST TECHNIQUE: Multiplanar, multiecho pulse sequences of the brain and surrounding structures were obtained without intravenous contrast. COMPARISON:  Prior head CT from earlier the same day. FINDINGS: Brain: Generalized age-related cerebral atrophy. Patchy T2/FLAIR hyperintensity within the periventricular deep white matter both cerebral hemispheres most consistent with chronic small vessel ischemic disease, moderate in nature. Approximate 6 mm focus of restricted diffusion involving the right thalamus consistent with an acute ischemic infarct (series 5, images 22, 21). Additional patchy diffusion abnormality measuring up to 14 mm in length seen involving the left  paramedian pons (series 5, image 16), consistent with an acute ischemic infarct as well. No associated hemorrhage or mass effect. No other evidence for acute or subacute ischemia. No encephalomalacia to suggest chronic cortical infarction elsewhere within the brain. No foci of susceptibility artifact to suggest acute or chronic intracranial hemorrhage. No mass lesion, midline shift or mass effect. No hydrocephalus or extra-axial fluid collection. Pituitary gland suprasellar region normal. Midline structures intact. Vascular: Major intracranial vascular flow voids are maintained. Skull and upper cervical spine: Craniocervical junction within normal limits. Upper cervical spine normal. Bone marrow signal intensity within normal limits. No scalp soft tissue abnormality. Sinuses/Orbits: Globes and orbital soft tissues within normal limits. Patient status post bilateral ocular lens replacement. Paranasal sinuses are largely clear. No significant mastoid effusion. Inner ear structures grossly normal. Other: None. IMPRESSION: 1. Small acute ischemic infarcts involving the right thalamus and left paramedian pons as above. No associated hemorrhage or mass effect. 2. Underlying moderate chronic microvascular ischemic disease for age. Electronically Signed   By: Jeannine Boga M.D.   On: 01/30/2020 22:13   US Carotid Bilateral (at [facility] and [facility] only)  Result Date: 01/31/2020 CLINICAL DATA:  74 year old female with a history transient neurologic symptoms EXAM: BILATERAL CAROTID DUPLEX ULTRASOUND TECHNIQUE: Pearline Cables scale  imaging, color Doppler and duplex ultrasound were performed of bilateral carotid and vertebral arteries in the neck. COMPARISON:  None. FINDINGS: Criteria: Quantification of carotid stenosis is based on velocity parameters that correlate the residual internal carotid diameter with NASCET-based stenosis levels, using the diameter of the distal internal carotid lumen as the denominator for stenosis  measurement. The following velocity measurements were obtained: RIGHT ICA:  Systolic 123XX123 cm/sec, Diastolic 19 cm/sec CCA:  69 cm/sec SYSTOLIC ICA/CCA RATIO:  1.8 ECA:  137 cm/sec LEFT ICA:  Systolic 73 cm/sec, Diastolic 16 cm/sec CCA:  69 cm/sec SYSTOLIC ICA/CCA RATIO:  1.1 ECA:  118 cm/sec Right Brachial SBP: Not acquired Left Brachial SBP: Not acquired RIGHT CAROTID ARTERY: No significant calcifications of the right common carotid artery. Intermediate waveform maintained. Moderate heterogeneous and partially calcified plaque at the right carotid bifurcation. No significant lumen shadowing. Low resistance waveform of the right ICA. No significant tortuosity. RIGHT VERTEBRAL ARTERY: Antegrade flow with low resistance waveform. LEFT CAROTID ARTERY: No significant calcifications of the left common carotid artery. Intermediate waveform maintained. Moderate heterogeneous and partially calcified plaque at the left carotid bifurcation. No significant lumen shadowing. Low resistance waveform of the left ICA. No significant tortuosity. LEFT VERTEBRAL ARTERY:  Antegrade flow with low resistance waveform. IMPRESSION: Color duplex indicates minimal heterogeneous and calcified plaque, with no hemodynamically significant stenosis by duplex criteria in the extracranial cerebrovascular circulation. Signed, Dulcy Fanny. Dellia Nims, RPVI [facility] Electronically Signed   By: Corrie Mckusick D.O.   On: 01/31/2020 09:18   DG Chest Port 1 View  Result Date: 01/30/2020 CLINICAL DATA:  Aphasia and bilateral weakness. EXAM: PORTABLE CHEST 1 VIEW COMPARISON:  March 28, 2016 FINDINGS: A 3 mm well-defined calcified nodular opacity is seen overlying the lateral aspect of the mid right lung. This is not clearly seen on the prior study. There is no evidence of acute infiltrate, pleural effusion or pneumothorax. The heart size and mediastinal contours are within normal limits. There is mild  calcification of the aortic arch. Multilevel degenerative changes seen throughout the thoracic spine. IMPRESSION: No active disease. Electronically Signed   By: Virgina Norfolk M.D.   On: 01/30/2020 21:10   ECHOCARDIOGRAM COMPLETE  Result Date: 01/31/2020    ECHOCARDIOGRAM REPORT   Patient Name:   Sabrina [name] Date of Exam: 01/31/2020 Medical Rec #:  [id number]      Height:       61.0 in Accession #:    [id number]     Weight:       155.0 lb Date of Birth:  Oct 11, 1946      BSA:          1.695 m Patient Age:    7 years       BP:           176/73 mmHg Patient Gender: F              HR:           70 bpm. Exam Location:  [facility] Procedure: 2D Echo Indications:     435.9 TIA  History:         Patient has no prior history of Echocardiogram examinations.                  Risk Factors:Dyslipidemia, Diabetes and Hypertension.  Sonographer:     L Thornton-Maynard Referring Phys:  [id number] Athena Masse Diagnosing Phys: Kate Sable MD IMPRESSIONS  1. Left ventricular ejection fraction,  by estimation, is 60 to 65%. The left ventricle has normal function. The left ventricle has no regional wall motion abnormalities. There is mild left ventricular hypertrophy. Left ventricular diastolic parameters were normal.  2. Right ventricular systolic function is normal. The right ventricular size is normal. There is normal pulmonary artery systolic pressure.  3. The mitral valve is grossly normal. Mild mitral valve regurgitation.  4. The aortic valve is tricuspid. Aortic valve regurgitation is not visualized. Mild aortic valve sclerosis is present, with no evidence of aortic valve stenosis.  5. The inferior vena cava is normal in size with greater than 50% respiratory variability, suggesting right atrial pressure of 3 mmHg.  6. Evidence of atrial level shunting detected by color flow Doppler. FINDINGS  Left Ventricle: Left ventricular ejection fraction, by estimation, is 60 to 65%. The left ventricle has normal function. The left  ventricle has no regional wall motion abnormalities. The left ventricular internal cavity size was normal in size. There is  mild left ventricular hypertrophy. Left ventricular diastolic parameters were normal. Right Ventricle: The right ventricular size is normal. No increase in right ventricular wall thickness. Right ventricular systolic function is normal. There is normal pulmonary artery systolic pressure. The tricuspid regurgitant velocity is 2.51 m/s, and  with an assumed right atrial pressure of 3 mmHg, the estimated right ventricular systolic pressure is [id number] mmHg. Left Atrium: Left atrial size was normal in size. Right Atrium: Right atrial size was normal in size. Pericardium: There is no evidence of pericardial effusion. Mitral Valve: The mitral valve is grossly normal. Mild mitral valve regurgitation. MV peak gradient, 3.9 mmHg. The mean mitral valve gradient is 2.0 mmHg. Tricuspid Valve: The tricuspid valve is normal in structure. Tricuspid valve regurgitation is trivial. Aortic Valve: The aortic valve is tricuspid. Aortic valve regurgitation is not visualized. Aortic regurgitation PHT measures 505 msec. Mild aortic valve sclerosis is present, with no evidence of aortic valve stenosis. Aortic valve mean gradient measures 3.0 mmHg. Aortic valve peak gradient measures 6.1 mmHg. Aortic valve area, by VTI measures 1.56 cm. Pulmonic Valve: The pulmonic valve was normal in structure. Pulmonic valve regurgitation is trivial. Aorta: The aortic root is normal in size and structure. Venous: The inferior vena cava is normal in size with greater than 50% respiratory variability, suggesting right atrial pressure of 3 mmHg. IAS/Shunts: Evidence of atrial level shunting detected by color flow Doppler.  LEFT VENTRICLE PLAX 2D LVIDd:         4.16 cm     Diastology LVIDs:         2.59 cm     LV e' lateral:   5.55 cm/s LV PW:         1.19 cm     LV E/e' lateral: 14.6 LV IVS:        1.17 cm     LV e' medial:    5.33 cm/s  LVOT diam:     1.90 cm     LV E/e' medial:  15.2 LV SV:         41 LV SV Index:   24 LVOT Area:     2.84 cm  LV Volumes (MOD) LV vol d, MOD A2C: 65.0 ml LV vol d, MOD A4C: 65.0 ml LV vol s, MOD A2C: 26.9 ml LV vol s, MOD A4C: 15.8 ml LV SV MOD A2C:     38.1 ml LV SV MOD A4C:     65.0 ml LV SV MOD BP:  46.3 ml RIGHT VENTRICLE RV S prime:     12.50 cm/s TAPSE (M-mode): 2.2 cm LEFT ATRIUM             Index LA diam:        3.50 cm 2.07 cm/m LA Vol (A2C):   33.6 ml 19.82 ml/m LA Vol (A4C):   46.2 ml 27.26 ml/m LA Biplane Vol: 39.6 ml 23.36 ml/m  AORTIC VALVE AV Area (Vmax):    1.53 cm AV Area (Vmean):   1.61 cm AV Area (VTI):     1.56 cm AV Vmax:           123.00 cm/s AV Vmean:          87.500 cm/s AV VTI:            0.263 m AV Peak Grad:      6.1 mmHg AV Mean Grad:      3.0 mmHg LVOT Vmax:         66.50 cm/s LVOT Vmean:        49.600 cm/s LVOT VTI:          0.145 m LVOT/AV VTI ratio: 0.55 AI PHT:            505 msec  AORTA Ao Root diam: 2.80 cm MITRAL VALVE               TRICUSPID VALVE MV Area (PHT): 4.23 cm    TR Peak grad:   25.2 mmHg MV Peak grad:  3.9 mmHg    TR Vmax:        251.00 cm/s MV Mean grad:  2.0 mmHg MV Vmax:       0.99 m/s    SHUNTS MV Vmean:      58.9 cm/s   Systemic VTI:  0.14 m MV E velocity: 81.00 cm/s  Systemic Diam: 1.90 cm MV A velocity: 88.30 cm/s MV E/A ratio:  0.92 Kate Sable MD Electronically signed by Kate Sable MD Signature Date/Time: 01/31/2020/3:35:31 PM    Final    CT HEAD CODE STROKE WO CONTRAST  Addendum Date: 01/30/2020   ADDENDUM REPORT: 01/30/2020 19:21 ADDENDUM: Study discussed by telephone with Dr. Jacqualine Code on 01/30/2020 at 1918 hours. Electronically Signed   By: Genevie Ann M.D.   On: 01/30/2020 19:21   Result Date: 01/30/2020 CLINICAL DATA:  Code stroke. 75 year old female with sudden onset slurred speech. EXAM: CT HEAD WITHOUT CONTRAST TECHNIQUE: Contiguous axial images were obtained from the base of the skull through the vertex without intravenous contrast.  COMPARISON:  Head CT 03/28/2016. FINDINGS: Brain: Increased patchy bilateral white matter hypodensity since 2017, and there is a small new hypodensity in the right thalamus on series 2, image 12. Mild generalized volume loss since 2017. No midline shift, mass effect, or evidence of intracranial mass lesion. No acute intracranial hemorrhage identified. No ventriculomegaly. No cortically based acute infarct identified. No cortical encephalomalacia identified. Brainstem and cerebellum appear negative. Vascular: Extensive Calcified atherosclerosis at the skull base. Distal right MCA calcified plaque suspected on series 5, image 11. No suspicious intracranial vascular hyperdensity. Skull: Chronic hyperostosis, normal variant. No acute osseous abnormality identified. Sinuses/Orbits: Visualized paranasal sinuses and mastoids are stable and well pneumatized. Other: Visualized orbits and scalp soft tissues are within normal limits. ASPECTS [facility] Stroke Program Early CT Score) Total score (0-10 with 10 being normal): 10 IMPRESSION: 1. Age indeterminate lacunar infarct in the right thalamus. Progressed cerebral white matter disease since 2017. 2. No acute cortically based infarct or acute  intracranial hemorrhage identified. 3. ASPECTS 10. Electronically Signed: By: Genevie Ann M.D. On: 01/30/2020 19:11       Subjective: Patient still feels tired, but no chest pain, palpitation.  No focal weakness, numbness.  Discharge Exam: Vitals:   02/01/20 0521 02/01/20 0726  BP: (!) 141/69 (!) 159/64  Pulse: 72 75  Resp:  18  Temp: 98.1 F (36.7 C) 98.2 F (36.8 C)  SpO2: 97% 100%   Vitals:   01/31/20 1956 01/31/20 2017 02/01/20 0521 02/01/20 0726  BP: (!) 203/78 (!) 176/81 (!) 141/69 (!) 159/64  Pulse: 75 73 72 75  Resp:  16  18  Temp: 98.6 F (37 C)  98.1 F (36.7 C) 98.2 F (36.8 C)  TempSrc: Oral  Oral Oral  SpO2: 100%  97% 100%  Weight:   70.1 kg   Height:        General: Pt is alert, awake, not in  acute distress, appears tired, weak. Cardiovascular: RRR, nl S1-S2, no murmurs appreciated.   No LE edema.   Respiratory: Normal respiratory rate and rhythm.  CTAB without rales or wheezes. Abdominal: Abdomen soft and non-tender.  No distension or HSM.   Neuro/Psych: Strength symmetric in upper and lower extremities.  Judgment and insight appear mildly impaired.   The results of significant diagnostics from this hospitalization (including imaging, microbiology, ancillary and laboratory) are listed below for reference.     Microbiology: No results found for this or any previous visit (from the past 240 hour(s)).   Labs: BNP (last 3 results) No results for input(s): BNP in the last 8760 hours. Basic Metabolic Panel: Recent Labs  Lab 01/30/20 1855 01/31/20 0959 02/01/20 0437  NA 137 138 139  K 4.1 3.6 4.1  CL 101 104 102  CO2 23 26 27   GLUCOSE 374* 202* 227*  BUN 21 15 17   CREATININE 0.99 0.80 0.71  CALCIUM 9.6 8.9 9.3  MG  --  1.9 2.0   Liver Function Tests: Recent Labs  Lab 01/30/20 1855  AST 23  ALT 21  ALKPHOS 72  BILITOT 0.6  PROT 7.1  ALBUMIN 4.1   No results for input(s): LIPASE, AMYLASE in the last 168 hours. No results for input(s): AMMONIA in the last 168 hours. CBC: Recent Labs  Lab 01/30/20 1855 01/31/20 0959 02/01/20 0437  WBC 6.7 4.9 5.9  NEUTROABS 2.7  --   --   HGB 12.8 12.6 13.3  HCT 38.5 39.0 40.0  MCV 89.1 91.3 89.1  PLT 337 309 316   Cardiac Enzymes: No results for input(s): CKTOTAL, CKMB, CKMBINDEX, TROPONINI in the last 168 hours. BNP: Invalid input(s): POCBNP CBG: Recent Labs  Lab 01/31/20 0725 01/31/20 1145 01/31/20 1642 01/31/20 2047 02/01/20 0728  GLUCAP 234* 154* 171* 193* 209*   D-Dimer No results for input(s): DDIMER in the last 72 hours. Hgb A1c Recent Labs    01/30/20 1855 01/31/20 0434  HGBA1C 12.7* 12.7*   Lipid Profile Recent Labs    01/31/20 0434  CHOL 163  HDL 39*  LDLCALC 66  TRIG 289*  CHOLHDL  4.2   Thyroid function studies No results for input(s): TSH, T4TOTAL, T3FREE, THYROIDAB in the last 72 hours.  Invalid input(s): FREET3 Anemia work up No results for input(s): VITAMINB12, FOLATE, FERRITIN, TIBC, IRON, RETICCTPCT in the last 72 hours. Urinalysis    Component Value Date/Time   COLORURINE YELLOW (A) 04/01/2016 1100   APPEARANCEUR HAZY (A) 04/01/2016 1100   LABSPEC 1.014 04/01/2016 1100  PHURINE 5.0 04/01/2016 1100   GLUCOSEU >500 (A) 04/01/2016 1100   HGBUR NEGATIVE 04/01/2016 1100   BILIRUBINUR NEGATIVE 04/01/2016 1100   KETONESUR TRACE (A) 04/01/2016 1100   PROTEINUR 30 (A) 04/01/2016 1100   NITRITE NEGATIVE 04/01/2016 1100   LEUKOCYTESUR NEGATIVE 04/01/2016 1100   Sepsis Labs Invalid input(s): PROCALCITONIN,  WBC,  LACTICIDVEN Microbiology No results found for this or any previous visit (from the past 240 hour(s)).   Time coordinating discharge: 35 minutes The [place] controlled substances registry was reviewed for this patient       SIGNED:   Edwin Dada, MD  [facility] 02/01/2020, 9:25 AM

## 2020-02-01 NOTE — Progress Notes (Signed)
Physical Therapy Treatment Patient Details Name: Sabrina [name] MRN: [id number] DOB: 27-Feb-1946 Today's Date: 02/01/2020    History of Present Illness Pt admitted for transient neurologic symptoms with complaints of brief neurologic episode while driving grandchildren home and had slurred speech, confusion, and was combative. 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 Other PMH includes DM, HTN, and anxiety.     PT Comments    Pt was seated EOB with RN/MD in room. She agrees to PT session and is cooperative throughout. Pt requested to use BR prior to ambulating in hallway. She was able to stand with min assist and required CGA throughout ambulation for safety. Ambulated with RW 120 ft with gait belt for safety. Pt walks with very slow cadence without LOB or unsteadiness. Pt returned to room seated EOB with RN tech in room. Lengthy discussion with pt/pt's daughter about DC concerns. Both pt/pt's daughter feel confident they can DC to home with HHPT. She will benefit from continued skilled PT to address deficits with strength, balance, and safe functional mobility.      Follow Up Recommendations  Home health PT;Supervision/Assistance - 24 hour     Equipment Recommendations  Rolling walker with 5" wheels;3in1 (PT)    Recommendations for Other Services       Precautions / Restrictions Precautions Precautions: Fall Restrictions Weight Bearing Restrictions: No    Mobility  Bed Mobility               General bed mobility comments: Pt was seated EOB pre/post session  Transfers Overall transfer level: Needs assistance Equipment used: Rolling walker (2 wheeled) Transfers: Sit to/from Stand Sit to Stand: Min assist;Min guard         General transfer comment: Pt was able to STS from EOB and toilet with min assist and vcs for technique. demionstrated good eccentric controlled lowering with stand to sit.  Ambulation/Gait Ambulation/Gait assistance: Min guard Gait Distance  (Feet): 120 Feet Assistive device: Rolling walker (2 wheeled) Gait Pattern/deviations: Step-through pattern Gait velocity: decreased   General Gait Details: Slow steadu gait without LOB or unsteadiness.   Stairs             Wheelchair Mobility    Modified Rankin (Stroke Patients Only)       Balance Overall balance assessment: Needs assistance;History of Falls Sitting-balance support: Feet supported Sitting balance-Leahy Scale: Good Sitting balance - Comments: pt seated EOB without LOB noted   Standing balance support: Bilateral upper extremity supported Standing balance-Leahy Scale: Good Standing balance comment: pt has no LOB with BUE support                            Cognition Arousal/Alertness: Awake/alert Behavior During Therapy: WFL for tasks assessed/performed Overall Cognitive Status: Impaired/Different from baseline Area of Impairment: Attention;Memory;Safety/judgement                   Current Attention Level: Alternating Memory: Decreased short-term memory   Safety/Judgement: Decreased awareness of safety     General Comments: Pt was seated EOB upon arriving. MD and RN in room. Pt seated EOB agreeable to PT session. Per duaghter, pt has slight cognition deficits. pt was able to follow commands but required increased time to process.      Exercises      General Comments        Pertinent Vitals/Pain Pain Assessment: No/denies pain Pain Score: 0-No pain Pain Location: B UEs  Home Living                      Prior Function            PT Goals (current goals can now be found in the care plan section) Acute Rehab PT Goals Patient Stated Goal: I want to go home Progress towards PT goals: Progressing toward goals    Frequency    7X/week      PT Plan Current plan remains appropriate    Co-evaluation              AM-PAC PT "6 Clicks" Mobility   Outcome Measure  Help needed turning from your  back to your side while in a flat bed without using bedrails?: A Little Help needed moving from lying on your back to sitting on the side of a flat bed without using bedrails?: A Little Help needed moving to and from a bed to a chair (including a wheelchair)?: A Little Help needed standing up from a chair using your arms (e.g., wheelchair or bedside chair)?: A Little Help needed to walk in hospital room?: A Little Help needed climbing 3-5 steps with a railing? : A Lot 6 Click Score: 17    End of Session Equipment Utilized During Treatment: Gait belt Activity Tolerance: Patient tolerated treatment well Patient left: in bed;with nursing/sitter in room(RN tech in room) Nurse Communication: Mobility status PT Visit Diagnosis: Unsteadiness on feet (R26.81);Muscle weakness (generalized) (M62.81);History of falling (Z91.81);Difficulty in walking, not elsewhere classified (R26.2)     Time: [id number] PT Time Calculation (min) (ACUTE ONLY): 24 min  Charges:  $Gait Training: 8-22 mins $Therapeutic Activity: 8-22 mins                     Julaine Fusi PTA 02/01/20, 9:56 AM

## 2020-02-01 NOTE — Evaluation (Signed)
Speech Language Pathology Evaluation Patient Details Name: Sabrina [name] MRN: [id number] DOB: [birth year] Today's Date: 02/01/2020 Time: [id number] SLP Time Calculation (min) (ACUTE ONLY): 45 min  Problem List:  Patient Active Problem List   Diagnosis Date Noted  . Acute CVA (cerebrovascular accident) ([place]) 01/31/2020  . Episode of transient neurologic symptoms 01/30/2020  . Hyperglycemia due to type 2 diabetes mellitus ([place]) 01/30/2020  . Incomplete uterine prolapse 12/08/2017  . Cystocele, midline 12/08/2017  . Bipolar affective disorder, manic, severe, with psychotic behavior ([place])   . Diabetes ([place]) 04/01/2016  . Dyslipidemia 04/01/2016  . Brief psychotic disorder ([place]) 03/28/2016  . Noncompliance 03/28/2016  . Essential hypertension 03/09/2016   Past Medical History:  Past Medical History:  Diagnosis Date  . Diabetes mellitus without complication ([place])   . High cholesterol   . Hypertension    Past Surgical History:  Past Surgical History:  Procedure Laterality Date  . DILATION AND CURETTAGE OF UTERUS    . POLYPECTOMY     HPI:  Pt is a 74 y.o. female history of diabetes, hypertension, Bipolar affective disorder, manic, severe, with psychotic behavior, noncompliance, and Brief psychotic disorder. Pt was brought to the emergency room as a code stroke after she experienced a brief neurologic episode earlier in the afternoon while was driving her grandchildren home from school. According to the daughter, she had slurred speech, confusion, became combative and defecated on herself. Symptoms had resolved and she was back to near normal baseline per Daughter in the ED, but continued to have intermittent slurred unintelligible speech and to have generalized weakness. MRI revealed small acute ischemic infarcts involving the right thalamus and pons; Moderately progressed cerebral white matter disease since 2017. Unsure of pt's baseline Cognitive status.   Assessment / Plan /  Recommendation Clinical Impression  Pt appears to present w/ mild, inconsistent Motor Speech deficits c/b min Dysfluency at conversation level - this was inconsistent and not as apparent when pt utilized strategies to focus on her speaking and took her time during conversation. The Dysfluency was notable to pt, and she stated "my tongue makes me talk [place]" - decreased articulation precision noted which she felt impacted her verbal communication  In conversation w/ others. When pt utilized strategies of slowing rate of speech, increasing volume(pt tended to speak in low volume almost mumbled speech intermittently), and pausing to focus on what she was saying, pt's Dysfluency was not pronounced. Practiced these strategies w/ pt encouraging her to use more volume in her speech. Discussed the importance of sitting fully upright to support diahpragmatic breathing and breath support for speech. Also, discussed the importance of wearing her upper Denture plate for improved precision of speech. During the evaluation, SLP became aware of potential Cognitive decline (baseline?) w/ pt's follow through w/ tasks, spontaneous singing when completing automatic speech tasks. She required verbal cues intermittently to focus and attend to tasks. She impulsively let herself fall onto her Left side on the bed "to rest" w/out monitoring or control. Noted MRI results indicating "Moderately progressed cerebral white matter disease since 2017". Pt was verbal w/ adequate expressive language for basic tasks. Unable to fully assess for receptive deficits at this brief evaluation. OM exam was [facility] for lingual/labial ROM and labial strength; slight decreased lingual strength during protrusion, posterior tasks w/ tongue blade -- unsure if pt understood for full following through w/ OM tasks. Symmetry WFL. No overt swallowing deficits noted during sips of thin liquids via straw/cup during session. Discussed Strategies w/ pt; practice  and  handouts given. Recommend pt f/u w/ Outpatient skilled ST servcies for a more formal Evaluation to identify Cognitive-linguistic deficits and to provide further education/support to improve communication in ADLs. Strongly recommend f/u w/ Neurology to formally assess pt's Cognitive status for baseline status. CM/NSG/MD updated. Pt/Dtr agreed.    SLP Assessment  SLP Recommendation/Assessment: All further Speech Lanaguage Pathology  needs can be addressed in the next venue of care SLP Visit Diagnosis: Dysarthria and anarthria (R47.1)    Follow Up Recommendations  Home health SLP(recommended, ordered)    Frequency and Duration (n/a)  (n/a)      SLP Evaluation Cognition  Overall Cognitive Status: Difficult to assess(unsure of what Baseline in light of MRI report) Arousal/Alertness: Awake/alert Orientation Level: Oriented to person;Oriented to place;Oriented to situation Attention: Focused;Sustained Focused Attention: Appears intact Focused Attention Impairment: Verbal basic;Functional basic Sustained Attention: Impaired Sustained Attention Impairment: Verbal complex;Functional complex Memory: Impaired Awareness: Impaired Behaviors: Impulsive Safety/Judgment: Impaired Comments: pt let herself fall onto her Left side on the bed from sitting "to rest"       Comprehension  Auditory Comprehension Overall Auditory Comprehension: (NT sufficiently) Visual Recognition/Discrimination Discrimination: Not tested Reading Comprehension Reading Status: Within funtional limits(word, short phrase level)    Expression Expression Primary Mode of Expression: Verbal Verbal Expression Overall Verbal Expression: Appears within functional limits for tasks assessed(grossly) Initiation: No impairment Automatic Speech: Name;Social Response;Counting;Day of week;Month of year;Singing Level of Generative/Spontaneous Verbalization: Sentence Repetition: No impairment Naming: No impairment Pragmatics:  Unable to assess Interfering Components: Attention;Speech intelligibility(intermittently) Non-Verbal Means of Communication: Not applicable Written Expression Dominant Hand: Right Written Expression: Not tested   Oral / Motor  Oral Motor/Sensory Function Overall Oral Motor/Sensory Function: Mild impairment Facial ROM: Within Functional Limits Facial Symmetry: Within Functional Limits Facial Strength: Within Functional Limits Facial Sensation: Within Functional Limits Lingual ROM: Within Functional Limits Lingual Symmetry: Within Functional Limits Lingual Strength: Reduced(protrusion, min posterior) Lingual Sensation: Within Functional Limits Velum: Within Functional Limits Mandible: Within Functional Limits Motor Speech Respiration: Within functional limits Phonation: Normal Resonance: Within functional limits Articulation: Impaired Level of Impairment: Phrase Intelligibility: Intelligibility reduced Word: 75-100% accurate Phrase: 75-100% accurate Sentence: 75-100% accurate Conversation: 75-100% accurate Motor Planning: Impaired(min) Motor Speech Errors: Aware Interfering Components: Inadequate dentition(placed her Upper Denture plate) Effective Techniques: Slow rate;Over-articulate;Increased vocal intensity   GO                     Orinda Kenner, MS, CCC-SLP , 02/01/2020, 4:29 PM

## 2020-02-01 NOTE — Progress Notes (Signed)
Discharge instructions given to pt and her daughter, all questions answered

## 2020-02-01 NOTE — NC FL2 (Signed)
[place] LEVEL OF CARE SCREENING TOOL     IDENTIFICATION  Patient Name: Sabrina [name] Birthdate: September 22, 1946 Sex: female Admission Date (Current Location): 01/30/2020  [place] and [place] Number:  Engineering geologist and Address:  [facility], [address] [place], [place] [address]      Provider Number: [id number]  Attending Physician Name and Address:  Edwin Dada, *  Relative Name and Phone Number:       Current Level of Care: Hospital Recommended Level of Care: [place] Prior Approval Number:    Date Approved/Denied:   PASRR Number: [id number]  Discharge Plan: SNF    Current Diagnoses: Patient Active Problem List   Diagnosis Date Noted  . Acute CVA (cerebrovascular accident) ([place]) 01/31/2020  . Episode of transient neurologic symptoms 01/30/2020  . Hyperglycemia due to type 2 diabetes mellitus ([place]) 01/30/2020  . Incomplete uterine prolapse 12/08/2017  . Cystocele, midline 12/08/2017  . Bipolar affective disorder, manic, severe, with psychotic behavior ([place])   . Diabetes ([place]) 04/01/2016  . Dyslipidemia 04/01/2016  . Brief psychotic disorder ([place]) 03/28/2016  . Noncompliance 03/28/2016  . Essential hypertension 03/09/2016    Orientation RESPIRATION BLADDER Height & Weight     Self, Time, Situation, Place  Normal Continent Weight: 154 lb 9.6 oz (70.1 kg) Height:  5\' 1"  (154.9 cm)  BEHAVIORAL SYMPTOMS/MOOD NEUROLOGICAL BOWEL NUTRITION STATUS      Incontinent Diet(carb modified, thin liquids)  AMBULATORY STATUS COMMUNICATION OF NEEDS Skin   Limited Assist Verbally Normal                       Personal Care Assistance Level of Assistance  Dressing, Bathing, Feeding Bathing Assistance: Limited assistance Feeding assistance: Independent Dressing Assistance: Limited assistance     Functional Limitations Info  Sight, Hearing, Speech Sight Info: Adequate Hearing Info:  Adequate Speech Info: Adequate    SPECIAL CARE FACTORS FREQUENCY  PT (By licensed PT), OT (By licensed OT)     PT Frequency: 5x OT Frequency: 5x            Contractures Contractures Info: Not present    Additional Factors Info  Code Status, Allergies Code Status Info: Full [place] Allergies Info: Glimepiride, Glipizide, Lisinopril, Losartan, Norco (Hydrocodone-acetaminophen), Pioglitazone           Current Medications (02/01/2020):  This is the current hospital active medication list Current Facility-Administered Medications  Medication Dose Route Frequency Provider Last Rate Last Admin  .  stroke: mapping our early stages of recovery book   Does not apply Once Athena Masse, MD      . acetaminophen (TYLENOL) tablet 650 mg  650 mg Oral Q4H PRN Athena Masse, MD       Or  . acetaminophen (TYLENOL) 160 MG/5ML solution 650 mg  650 mg Per Tube Q4H PRN Athena Masse, MD       Or  . acetaminophen (TYLENOL) suppository 650 mg  650 mg Rectal Q4H PRN Athena Masse, MD      . aspirin chewable tablet 324 mg  324 mg Oral Daily Enzo Bi, MD   324 mg at 02/01/20 0802  . benazepril (LOTENSIN) tablet 20 mg  20 mg Oral BID Enzo Bi, MD   20 mg at 01/31/20 2203  . enoxaparin (LOVENOX) injection 40 mg  40 mg Subcutaneous Q24H Athena Masse, MD   40 mg at 01/31/20 2008  . hydrALAZINE (APRESOLINE) injection 10  mg  10 mg Intravenous Q6H PRN Sharion Settler, NP      . insulin aspart (novoLOG) injection 0-15 Units  0-15 Units Subcutaneous TID WC Athena Masse, MD   5 Units at 02/01/20 0801  . isosorbide mononitrate (IMDUR) 24 hr tablet 60 mg  60 mg Oral BID Enzo Bi, MD   60 mg at 02/01/20 0803  . loperamide (IMODIUM) capsule 2 mg  2 mg Oral PRN Enzo Bi, MD   2 mg at 01/31/20 1619  . LORazepam (ATIVAN) injection 1-2 mg  1-2 mg Intravenous Q2H PRN Judd Gaudier V, MD      . rosuvastatin (CRESTOR) tablet 20 mg  20 mg Oral q1800 Enzo Bi, MD   20 mg at 01/31/20 1619  . senna-docusate  (Senokot-S) tablet 1 tablet  1 tablet Oral QHS PRN Judd Gaudier V, MD      . sodium chloride flush (NS) 0.9 % injection 3 mL  3 mL Intravenous Q12H Enzo Bi, MD   3 mL at 02/01/20 0803  . spironolactone (ALDACTONE) tablet 25 mg  25 mg Oral Daily Enzo Bi, MD   25 mg at 02/01/20 0802     Discharge Medications: Please see discharge summary for a list of discharge medications.  Relevant Imaging Results:  Relevant Lab Results:   Additional Information [id number]  Eileen Stanford, LCSW

## 2020-02-02 ENCOUNTER — Other Ambulatory Visit: Payer: Self-pay

## 2020-02-05 ENCOUNTER — Other Ambulatory Visit: Payer: Self-pay

## 2020-02-05 NOTE — Patient Outreach (Signed)
[facility]) Care Management  02/05/2020  Sabrina [name] 1946-03-02 [id number]     Transition of Care Referral  Referral Date: 02/05/2020 Referral Source: [facility] Liaison Date of Discharge: 02/01/2020 Facility: [place]: [facility] Medicare PCP Office Does [employer]    Outreach attempt # 1 to patient. Spoke with daughter who requested call be completed with her due to patient's condition. She reports that patient is doing fairly well since returning home. She states she is still waiting to get [facility] approval from [facility], She has already contacted insurance company over the weekend and reports someone is supposed to call her back. Daughter reports she has called PCP office to make an appt and discuss meds. Patient had med changes at discharge. Daughter adamant that she does not wish to start new meds and make med changes until patient first seen and given the approval by MD. Discussed importance of med adherence. Daughter provided with RN CM contact info to call for any future needs or concerns as she declined services at this time.     Plan: RN CM will close case at this time.    [name], RN,BSN,CCM [place] Management Telephonic Care Management Coordinator Direct Phone: [phone] Toll Free: [phone] Fax: [phone]

## 2020-02-10 DIAGNOSIS — R4781 Slurred speech: Secondary | ICD-10-CM | POA: Diagnosis not present

## 2020-02-10 DIAGNOSIS — F419 Anxiety disorder, unspecified: Secondary | ICD-10-CM | POA: Diagnosis not present

## 2020-02-10 DIAGNOSIS — E1165 Type 2 diabetes mellitus with hyperglycemia: Secondary | ICD-10-CM | POA: Diagnosis not present

## 2020-02-10 DIAGNOSIS — I119 Hypertensive heart disease without heart failure: Secondary | ICD-10-CM | POA: Diagnosis not present

## 2020-02-10 DIAGNOSIS — F312 Bipolar disorder, current episode manic severe with psychotic features: Secondary | ICD-10-CM | POA: Diagnosis not present

## 2020-02-10 DIAGNOSIS — I69398 Other sequelae of cerebral infarction: Secondary | ICD-10-CM | POA: Diagnosis not present

## 2020-02-10 DIAGNOSIS — I051 Rheumatic mitral insufficiency: Secondary | ICD-10-CM | POA: Diagnosis not present

## 2020-02-10 DIAGNOSIS — F23 Brief psychotic disorder: Secondary | ICD-10-CM | POA: Diagnosis not present

## 2020-02-10 DIAGNOSIS — R262 Difficulty in walking, not elsewhere classified: Secondary | ICD-10-CM | POA: Diagnosis not present

## 2020-02-12 DIAGNOSIS — E7800 Pure hypercholesterolemia, unspecified: Secondary | ICD-10-CM | POA: Diagnosis not present

## 2020-02-12 DIAGNOSIS — I10 Essential (primary) hypertension: Secondary | ICD-10-CM | POA: Diagnosis not present

## 2020-02-12 DIAGNOSIS — I69328 Other speech and language deficits following cerebral infarction: Secondary | ICD-10-CM | POA: Diagnosis not present

## 2020-02-12 DIAGNOSIS — Z79899 Other long term (current) drug therapy: Secondary | ICD-10-CM | POA: Diagnosis not present

## 2020-02-12 DIAGNOSIS — E118 Type 2 diabetes mellitus with unspecified complications: Secondary | ICD-10-CM | POA: Diagnosis not present

## 2020-02-12 DIAGNOSIS — I6381 Other cerebral infarction due to occlusion or stenosis of small artery: Secondary | ICD-10-CM | POA: Insufficient documentation

## 2020-02-14 DIAGNOSIS — I119 Hypertensive heart disease without heart failure: Secondary | ICD-10-CM | POA: Diagnosis not present

## 2020-02-14 DIAGNOSIS — R262 Difficulty in walking, not elsewhere classified: Secondary | ICD-10-CM | POA: Diagnosis not present

## 2020-02-14 DIAGNOSIS — E1165 Type 2 diabetes mellitus with hyperglycemia: Secondary | ICD-10-CM | POA: Diagnosis not present

## 2020-02-14 DIAGNOSIS — R4781 Slurred speech: Secondary | ICD-10-CM | POA: Diagnosis not present

## 2020-02-14 DIAGNOSIS — I69398 Other sequelae of cerebral infarction: Secondary | ICD-10-CM | POA: Diagnosis not present

## 2020-02-14 DIAGNOSIS — I051 Rheumatic mitral insufficiency: Secondary | ICD-10-CM | POA: Diagnosis not present

## 2020-02-14 DIAGNOSIS — F419 Anxiety disorder, unspecified: Secondary | ICD-10-CM | POA: Diagnosis not present

## 2020-02-14 DIAGNOSIS — F312 Bipolar disorder, current episode manic severe with psychotic features: Secondary | ICD-10-CM | POA: Diagnosis not present

## 2020-02-14 DIAGNOSIS — F23 Brief psychotic disorder: Secondary | ICD-10-CM | POA: Diagnosis not present

## 2020-02-15 DIAGNOSIS — E1165 Type 2 diabetes mellitus with hyperglycemia: Secondary | ICD-10-CM | POA: Diagnosis not present

## 2020-02-15 DIAGNOSIS — F312 Bipolar disorder, current episode manic severe with psychotic features: Secondary | ICD-10-CM | POA: Diagnosis not present

## 2020-02-15 DIAGNOSIS — R262 Difficulty in walking, not elsewhere classified: Secondary | ICD-10-CM | POA: Diagnosis not present

## 2020-02-15 DIAGNOSIS — I051 Rheumatic mitral insufficiency: Secondary | ICD-10-CM | POA: Diagnosis not present

## 2020-02-15 DIAGNOSIS — I119 Hypertensive heart disease without heart failure: Secondary | ICD-10-CM | POA: Diagnosis not present

## 2020-02-15 DIAGNOSIS — R4781 Slurred speech: Secondary | ICD-10-CM | POA: Diagnosis not present

## 2020-02-15 DIAGNOSIS — F23 Brief psychotic disorder: Secondary | ICD-10-CM | POA: Diagnosis not present

## 2020-02-15 DIAGNOSIS — F419 Anxiety disorder, unspecified: Secondary | ICD-10-CM | POA: Diagnosis not present

## 2020-02-15 DIAGNOSIS — I69398 Other sequelae of cerebral infarction: Secondary | ICD-10-CM | POA: Diagnosis not present

## 2020-02-20 DIAGNOSIS — F419 Anxiety disorder, unspecified: Secondary | ICD-10-CM | POA: Diagnosis not present

## 2020-02-20 DIAGNOSIS — E1165 Type 2 diabetes mellitus with hyperglycemia: Secondary | ICD-10-CM | POA: Diagnosis not present

## 2020-02-20 DIAGNOSIS — F312 Bipolar disorder, current episode manic severe with psychotic features: Secondary | ICD-10-CM | POA: Diagnosis not present

## 2020-02-20 DIAGNOSIS — R4781 Slurred speech: Secondary | ICD-10-CM | POA: Diagnosis not present

## 2020-02-20 DIAGNOSIS — F23 Brief psychotic disorder: Secondary | ICD-10-CM | POA: Diagnosis not present

## 2020-02-20 DIAGNOSIS — I69398 Other sequelae of cerebral infarction: Secondary | ICD-10-CM | POA: Diagnosis not present

## 2020-02-20 DIAGNOSIS — I051 Rheumatic mitral insufficiency: Secondary | ICD-10-CM | POA: Diagnosis not present

## 2020-02-20 DIAGNOSIS — I119 Hypertensive heart disease without heart failure: Secondary | ICD-10-CM | POA: Diagnosis not present

## 2020-02-20 DIAGNOSIS — R262 Difficulty in walking, not elsewhere classified: Secondary | ICD-10-CM | POA: Diagnosis not present

## 2020-02-22 DIAGNOSIS — F312 Bipolar disorder, current episode manic severe with psychotic features: Secondary | ICD-10-CM | POA: Diagnosis not present

## 2020-02-22 DIAGNOSIS — F23 Brief psychotic disorder: Secondary | ICD-10-CM | POA: Diagnosis not present

## 2020-02-22 DIAGNOSIS — F419 Anxiety disorder, unspecified: Secondary | ICD-10-CM | POA: Diagnosis not present

## 2020-02-22 DIAGNOSIS — I119 Hypertensive heart disease without heart failure: Secondary | ICD-10-CM | POA: Diagnosis not present

## 2020-02-22 DIAGNOSIS — R4781 Slurred speech: Secondary | ICD-10-CM | POA: Diagnosis not present

## 2020-02-22 DIAGNOSIS — E1165 Type 2 diabetes mellitus with hyperglycemia: Secondary | ICD-10-CM | POA: Diagnosis not present

## 2020-02-22 DIAGNOSIS — R262 Difficulty in walking, not elsewhere classified: Secondary | ICD-10-CM | POA: Diagnosis not present

## 2020-02-22 DIAGNOSIS — I051 Rheumatic mitral insufficiency: Secondary | ICD-10-CM | POA: Diagnosis not present

## 2020-02-22 DIAGNOSIS — I69398 Other sequelae of cerebral infarction: Secondary | ICD-10-CM | POA: Diagnosis not present

## 2020-02-26 DIAGNOSIS — R262 Difficulty in walking, not elsewhere classified: Secondary | ICD-10-CM | POA: Diagnosis not present

## 2020-02-26 DIAGNOSIS — F419 Anxiety disorder, unspecified: Secondary | ICD-10-CM | POA: Diagnosis not present

## 2020-02-26 DIAGNOSIS — I051 Rheumatic mitral insufficiency: Secondary | ICD-10-CM | POA: Diagnosis not present

## 2020-02-26 DIAGNOSIS — R4781 Slurred speech: Secondary | ICD-10-CM | POA: Diagnosis not present

## 2020-02-26 DIAGNOSIS — I69398 Other sequelae of cerebral infarction: Secondary | ICD-10-CM | POA: Diagnosis not present

## 2020-02-26 DIAGNOSIS — F312 Bipolar disorder, current episode manic severe with psychotic features: Secondary | ICD-10-CM | POA: Diagnosis not present

## 2020-02-26 DIAGNOSIS — E1165 Type 2 diabetes mellitus with hyperglycemia: Secondary | ICD-10-CM | POA: Diagnosis not present

## 2020-02-26 DIAGNOSIS — F23 Brief psychotic disorder: Secondary | ICD-10-CM | POA: Diagnosis not present

## 2020-02-26 DIAGNOSIS — I119 Hypertensive heart disease without heart failure: Secondary | ICD-10-CM | POA: Diagnosis not present

## 2020-02-27 DIAGNOSIS — Z8673 Personal history of transient ischemic attack (TIA), and cerebral infarction without residual deficits: Secondary | ICD-10-CM | POA: Diagnosis not present

## 2020-02-27 DIAGNOSIS — R4781 Slurred speech: Secondary | ICD-10-CM | POA: Diagnosis not present

## 2020-02-27 DIAGNOSIS — R410 Disorientation, unspecified: Secondary | ICD-10-CM | POA: Diagnosis not present

## 2020-02-27 DIAGNOSIS — R29898 Other symptoms and signs involving the musculoskeletal system: Secondary | ICD-10-CM | POA: Diagnosis not present

## 2020-02-27 DIAGNOSIS — I4891 Unspecified atrial fibrillation: Secondary | ICD-10-CM | POA: Diagnosis not present

## 2020-02-29 DIAGNOSIS — R809 Proteinuria, unspecified: Secondary | ICD-10-CM | POA: Diagnosis not present

## 2020-02-29 DIAGNOSIS — E782 Mixed hyperlipidemia: Secondary | ICD-10-CM | POA: Diagnosis not present

## 2020-02-29 DIAGNOSIS — I10 Essential (primary) hypertension: Secondary | ICD-10-CM | POA: Diagnosis not present

## 2020-02-29 DIAGNOSIS — E1165 Type 2 diabetes mellitus with hyperglycemia: Secondary | ICD-10-CM | POA: Diagnosis not present

## 2020-02-29 DIAGNOSIS — E1129 Type 2 diabetes mellitus with other diabetic kidney complication: Secondary | ICD-10-CM | POA: Diagnosis not present

## 2020-03-02 DIAGNOSIS — R410 Disorientation, unspecified: Secondary | ICD-10-CM | POA: Insufficient documentation

## 2020-03-02 DIAGNOSIS — Z8673 Personal history of transient ischemic attack (TIA), and cerebral infarction without residual deficits: Secondary | ICD-10-CM | POA: Insufficient documentation

## 2020-03-02 DIAGNOSIS — R4781 Slurred speech: Secondary | ICD-10-CM | POA: Insufficient documentation

## 2020-03-02 DIAGNOSIS — R29898 Other symptoms and signs involving the musculoskeletal system: Secondary | ICD-10-CM | POA: Insufficient documentation

## 2020-03-05 DIAGNOSIS — F312 Bipolar disorder, current episode manic severe with psychotic features: Secondary | ICD-10-CM | POA: Diagnosis not present

## 2020-03-05 DIAGNOSIS — F419 Anxiety disorder, unspecified: Secondary | ICD-10-CM | POA: Diagnosis not present

## 2020-03-05 DIAGNOSIS — R262 Difficulty in walking, not elsewhere classified: Secondary | ICD-10-CM | POA: Diagnosis not present

## 2020-03-05 DIAGNOSIS — I69398 Other sequelae of cerebral infarction: Secondary | ICD-10-CM | POA: Diagnosis not present

## 2020-03-05 DIAGNOSIS — E1165 Type 2 diabetes mellitus with hyperglycemia: Secondary | ICD-10-CM | POA: Diagnosis not present

## 2020-03-05 DIAGNOSIS — F23 Brief psychotic disorder: Secondary | ICD-10-CM | POA: Diagnosis not present

## 2020-03-05 DIAGNOSIS — I051 Rheumatic mitral insufficiency: Secondary | ICD-10-CM | POA: Diagnosis not present

## 2020-03-05 DIAGNOSIS — I119 Hypertensive heart disease without heart failure: Secondary | ICD-10-CM | POA: Diagnosis not present

## 2020-03-05 DIAGNOSIS — R4781 Slurred speech: Secondary | ICD-10-CM | POA: Diagnosis not present

## 2020-03-07 DIAGNOSIS — E113393 Type 2 diabetes mellitus with moderate nonproliferative diabetic retinopathy without macular edema, bilateral: Secondary | ICD-10-CM | POA: Diagnosis not present

## 2020-03-07 DIAGNOSIS — H524 Presbyopia: Secondary | ICD-10-CM | POA: Diagnosis not present

## 2020-03-11 DIAGNOSIS — I69398 Other sequelae of cerebral infarction: Secondary | ICD-10-CM | POA: Diagnosis not present

## 2020-03-11 DIAGNOSIS — F23 Brief psychotic disorder: Secondary | ICD-10-CM | POA: Diagnosis not present

## 2020-03-11 DIAGNOSIS — F312 Bipolar disorder, current episode manic severe with psychotic features: Secondary | ICD-10-CM | POA: Diagnosis not present

## 2020-03-11 DIAGNOSIS — I119 Hypertensive heart disease without heart failure: Secondary | ICD-10-CM | POA: Diagnosis not present

## 2020-03-11 DIAGNOSIS — R4781 Slurred speech: Secondary | ICD-10-CM | POA: Diagnosis not present

## 2020-03-11 DIAGNOSIS — F419 Anxiety disorder, unspecified: Secondary | ICD-10-CM | POA: Diagnosis not present

## 2020-03-11 DIAGNOSIS — R262 Difficulty in walking, not elsewhere classified: Secondary | ICD-10-CM | POA: Diagnosis not present

## 2020-03-11 DIAGNOSIS — E1165 Type 2 diabetes mellitus with hyperglycemia: Secondary | ICD-10-CM | POA: Diagnosis not present

## 2020-03-11 DIAGNOSIS — I051 Rheumatic mitral insufficiency: Secondary | ICD-10-CM | POA: Diagnosis not present

## 2020-03-13 DIAGNOSIS — F312 Bipolar disorder, current episode manic severe with psychotic features: Secondary | ICD-10-CM | POA: Diagnosis not present

## 2020-03-13 DIAGNOSIS — E119 Type 2 diabetes mellitus without complications: Secondary | ICD-10-CM | POA: Diagnosis not present

## 2020-03-13 DIAGNOSIS — I69398 Other sequelae of cerebral infarction: Secondary | ICD-10-CM | POA: Diagnosis not present

## 2020-03-13 DIAGNOSIS — E1165 Type 2 diabetes mellitus with hyperglycemia: Secondary | ICD-10-CM | POA: Diagnosis not present

## 2020-03-13 DIAGNOSIS — F23 Brief psychotic disorder: Secondary | ICD-10-CM | POA: Diagnosis not present

## 2020-03-13 DIAGNOSIS — R262 Difficulty in walking, not elsewhere classified: Secondary | ICD-10-CM | POA: Diagnosis not present

## 2020-03-13 DIAGNOSIS — R4781 Slurred speech: Secondary | ICD-10-CM | POA: Diagnosis not present

## 2020-03-18 DIAGNOSIS — F312 Bipolar disorder, current episode manic severe with psychotic features: Secondary | ICD-10-CM | POA: Diagnosis not present

## 2020-03-18 DIAGNOSIS — R262 Difficulty in walking, not elsewhere classified: Secondary | ICD-10-CM | POA: Diagnosis not present

## 2020-03-18 DIAGNOSIS — F419 Anxiety disorder, unspecified: Secondary | ICD-10-CM | POA: Diagnosis not present

## 2020-03-18 DIAGNOSIS — E1165 Type 2 diabetes mellitus with hyperglycemia: Secondary | ICD-10-CM | POA: Diagnosis not present

## 2020-03-18 DIAGNOSIS — I69398 Other sequelae of cerebral infarction: Secondary | ICD-10-CM | POA: Diagnosis not present

## 2020-03-18 DIAGNOSIS — I119 Hypertensive heart disease without heart failure: Secondary | ICD-10-CM | POA: Diagnosis not present

## 2020-03-18 DIAGNOSIS — F23 Brief psychotic disorder: Secondary | ICD-10-CM | POA: Diagnosis not present

## 2020-03-18 DIAGNOSIS — R4781 Slurred speech: Secondary | ICD-10-CM | POA: Diagnosis not present

## 2020-03-18 DIAGNOSIS — I051 Rheumatic mitral insufficiency: Secondary | ICD-10-CM | POA: Diagnosis not present

## 2020-03-26 DIAGNOSIS — E119 Type 2 diabetes mellitus without complications: Secondary | ICD-10-CM | POA: Diagnosis not present

## 2020-03-26 DIAGNOSIS — E785 Hyperlipidemia, unspecified: Secondary | ICD-10-CM | POA: Diagnosis not present

## 2020-03-26 DIAGNOSIS — R410 Disorientation, unspecified: Secondary | ICD-10-CM | POA: Diagnosis not present

## 2020-03-26 DIAGNOSIS — R4781 Slurred speech: Secondary | ICD-10-CM | POA: Diagnosis not present

## 2020-03-26 DIAGNOSIS — Z794 Long term (current) use of insulin: Secondary | ICD-10-CM | POA: Diagnosis not present

## 2020-03-26 DIAGNOSIS — I10 Essential (primary) hypertension: Secondary | ICD-10-CM | POA: Diagnosis not present

## 2020-03-26 DIAGNOSIS — R479 Unspecified speech disturbances: Secondary | ICD-10-CM | POA: Diagnosis not present

## 2020-03-26 DIAGNOSIS — Z8673 Personal history of transient ischemic attack (TIA), and cerebral infarction without residual deficits: Secondary | ICD-10-CM | POA: Diagnosis not present

## 2020-03-26 DIAGNOSIS — R29898 Other symptoms and signs involving the musculoskeletal system: Secondary | ICD-10-CM | POA: Diagnosis not present

## 2020-03-26 DIAGNOSIS — Z0000 Encounter for general adult medical examination without abnormal findings: Secondary | ICD-10-CM | POA: Diagnosis not present

## 2020-03-27 DIAGNOSIS — R262 Difficulty in walking, not elsewhere classified: Secondary | ICD-10-CM | POA: Diagnosis not present

## 2020-03-27 DIAGNOSIS — I051 Rheumatic mitral insufficiency: Secondary | ICD-10-CM | POA: Diagnosis not present

## 2020-03-27 DIAGNOSIS — E1165 Type 2 diabetes mellitus with hyperglycemia: Secondary | ICD-10-CM | POA: Diagnosis not present

## 2020-03-27 DIAGNOSIS — F419 Anxiety disorder, unspecified: Secondary | ICD-10-CM | POA: Diagnosis not present

## 2020-03-27 DIAGNOSIS — R4781 Slurred speech: Secondary | ICD-10-CM | POA: Diagnosis not present

## 2020-03-27 DIAGNOSIS — F23 Brief psychotic disorder: Secondary | ICD-10-CM | POA: Diagnosis not present

## 2020-03-27 DIAGNOSIS — I69398 Other sequelae of cerebral infarction: Secondary | ICD-10-CM | POA: Diagnosis not present

## 2020-03-27 DIAGNOSIS — F312 Bipolar disorder, current episode manic severe with psychotic features: Secondary | ICD-10-CM | POA: Diagnosis not present

## 2020-03-27 DIAGNOSIS — I119 Hypertensive heart disease without heart failure: Secondary | ICD-10-CM | POA: Diagnosis not present

## 2020-04-05 DIAGNOSIS — R4781 Slurred speech: Secondary | ICD-10-CM | POA: Diagnosis not present

## 2020-04-05 DIAGNOSIS — F419 Anxiety disorder, unspecified: Secondary | ICD-10-CM | POA: Diagnosis not present

## 2020-04-05 DIAGNOSIS — R262 Difficulty in walking, not elsewhere classified: Secondary | ICD-10-CM | POA: Diagnosis not present

## 2020-04-05 DIAGNOSIS — E1165 Type 2 diabetes mellitus with hyperglycemia: Secondary | ICD-10-CM | POA: Diagnosis not present

## 2020-04-05 DIAGNOSIS — I051 Rheumatic mitral insufficiency: Secondary | ICD-10-CM | POA: Diagnosis not present

## 2020-04-05 DIAGNOSIS — F23 Brief psychotic disorder: Secondary | ICD-10-CM | POA: Diagnosis not present

## 2020-04-05 DIAGNOSIS — I69398 Other sequelae of cerebral infarction: Secondary | ICD-10-CM | POA: Diagnosis not present

## 2020-04-05 DIAGNOSIS — F312 Bipolar disorder, current episode manic severe with psychotic features: Secondary | ICD-10-CM | POA: Diagnosis not present

## 2020-04-05 DIAGNOSIS — I119 Hypertensive heart disease without heart failure: Secondary | ICD-10-CM | POA: Diagnosis not present

## 2020-04-10 DIAGNOSIS — F23 Brief psychotic disorder: Secondary | ICD-10-CM | POA: Diagnosis not present

## 2020-04-10 DIAGNOSIS — F419 Anxiety disorder, unspecified: Secondary | ICD-10-CM | POA: Diagnosis not present

## 2020-04-10 DIAGNOSIS — E1165 Type 2 diabetes mellitus with hyperglycemia: Secondary | ICD-10-CM | POA: Diagnosis not present

## 2020-04-10 DIAGNOSIS — I051 Rheumatic mitral insufficiency: Secondary | ICD-10-CM | POA: Diagnosis not present

## 2020-04-10 DIAGNOSIS — F312 Bipolar disorder, current episode manic severe with psychotic features: Secondary | ICD-10-CM | POA: Diagnosis not present

## 2020-04-10 DIAGNOSIS — R262 Difficulty in walking, not elsewhere classified: Secondary | ICD-10-CM | POA: Diagnosis not present

## 2020-04-10 DIAGNOSIS — R4781 Slurred speech: Secondary | ICD-10-CM | POA: Diagnosis not present

## 2020-04-10 DIAGNOSIS — I119 Hypertensive heart disease without heart failure: Secondary | ICD-10-CM | POA: Diagnosis not present

## 2020-04-10 DIAGNOSIS — I69398 Other sequelae of cerebral infarction: Secondary | ICD-10-CM | POA: Diagnosis not present

## 2020-04-11 DIAGNOSIS — F419 Anxiety disorder, unspecified: Secondary | ICD-10-CM | POA: Diagnosis not present

## 2020-04-11 DIAGNOSIS — F23 Brief psychotic disorder: Secondary | ICD-10-CM | POA: Diagnosis not present

## 2020-04-11 DIAGNOSIS — R262 Difficulty in walking, not elsewhere classified: Secondary | ICD-10-CM | POA: Diagnosis not present

## 2020-04-11 DIAGNOSIS — I051 Rheumatic mitral insufficiency: Secondary | ICD-10-CM | POA: Diagnosis not present

## 2020-04-11 DIAGNOSIS — E1165 Type 2 diabetes mellitus with hyperglycemia: Secondary | ICD-10-CM | POA: Diagnosis not present

## 2020-04-11 DIAGNOSIS — I69398 Other sequelae of cerebral infarction: Secondary | ICD-10-CM | POA: Diagnosis not present

## 2020-04-11 DIAGNOSIS — F312 Bipolar disorder, current episode manic severe with psychotic features: Secondary | ICD-10-CM | POA: Diagnosis not present

## 2020-04-11 DIAGNOSIS — I119 Hypertensive heart disease without heart failure: Secondary | ICD-10-CM | POA: Diagnosis not present

## 2020-04-11 DIAGNOSIS — R4781 Slurred speech: Secondary | ICD-10-CM | POA: Diagnosis not present

## 2020-04-15 DIAGNOSIS — E1165 Type 2 diabetes mellitus with hyperglycemia: Secondary | ICD-10-CM | POA: Diagnosis not present

## 2020-04-15 DIAGNOSIS — R809 Proteinuria, unspecified: Secondary | ICD-10-CM | POA: Diagnosis not present

## 2020-04-15 DIAGNOSIS — I10 Essential (primary) hypertension: Secondary | ICD-10-CM | POA: Diagnosis not present

## 2020-04-15 DIAGNOSIS — E782 Mixed hyperlipidemia: Secondary | ICD-10-CM | POA: Diagnosis not present

## 2020-04-15 DIAGNOSIS — E1129 Type 2 diabetes mellitus with other diabetic kidney complication: Secondary | ICD-10-CM | POA: Diagnosis not present

## 2020-04-17 DIAGNOSIS — I051 Rheumatic mitral insufficiency: Secondary | ICD-10-CM | POA: Diagnosis not present

## 2020-04-17 DIAGNOSIS — R262 Difficulty in walking, not elsewhere classified: Secondary | ICD-10-CM | POA: Diagnosis not present

## 2020-04-17 DIAGNOSIS — I69398 Other sequelae of cerebral infarction: Secondary | ICD-10-CM | POA: Diagnosis not present

## 2020-04-17 DIAGNOSIS — I119 Hypertensive heart disease without heart failure: Secondary | ICD-10-CM | POA: Diagnosis not present

## 2020-04-17 DIAGNOSIS — R4781 Slurred speech: Secondary | ICD-10-CM | POA: Diagnosis not present

## 2020-04-17 DIAGNOSIS — E1165 Type 2 diabetes mellitus with hyperglycemia: Secondary | ICD-10-CM | POA: Diagnosis not present

## 2020-04-17 DIAGNOSIS — F23 Brief psychotic disorder: Secondary | ICD-10-CM | POA: Diagnosis not present

## 2020-04-17 DIAGNOSIS — F312 Bipolar disorder, current episode manic severe with psychotic features: Secondary | ICD-10-CM | POA: Diagnosis not present

## 2020-04-17 DIAGNOSIS — F419 Anxiety disorder, unspecified: Secondary | ICD-10-CM | POA: Diagnosis not present

## 2020-04-22 DIAGNOSIS — F312 Bipolar disorder, current episode manic severe with psychotic features: Secondary | ICD-10-CM | POA: Diagnosis not present

## 2020-04-22 DIAGNOSIS — R4781 Slurred speech: Secondary | ICD-10-CM | POA: Diagnosis not present

## 2020-04-22 DIAGNOSIS — R262 Difficulty in walking, not elsewhere classified: Secondary | ICD-10-CM | POA: Diagnosis not present

## 2020-04-22 DIAGNOSIS — E1165 Type 2 diabetes mellitus with hyperglycemia: Secondary | ICD-10-CM | POA: Diagnosis not present

## 2020-04-22 DIAGNOSIS — I119 Hypertensive heart disease without heart failure: Secondary | ICD-10-CM | POA: Diagnosis not present

## 2020-04-22 DIAGNOSIS — I69398 Other sequelae of cerebral infarction: Secondary | ICD-10-CM | POA: Diagnosis not present

## 2020-04-22 DIAGNOSIS — F23 Brief psychotic disorder: Secondary | ICD-10-CM | POA: Diagnosis not present

## 2020-04-24 DIAGNOSIS — I119 Hypertensive heart disease without heart failure: Secondary | ICD-10-CM | POA: Diagnosis not present

## 2020-04-24 DIAGNOSIS — R262 Difficulty in walking, not elsewhere classified: Secondary | ICD-10-CM | POA: Diagnosis not present

## 2020-04-24 DIAGNOSIS — I69398 Other sequelae of cerebral infarction: Secondary | ICD-10-CM | POA: Diagnosis not present

## 2020-04-24 DIAGNOSIS — F312 Bipolar disorder, current episode manic severe with psychotic features: Secondary | ICD-10-CM | POA: Diagnosis not present

## 2020-04-24 DIAGNOSIS — F419 Anxiety disorder, unspecified: Secondary | ICD-10-CM | POA: Diagnosis not present

## 2020-04-24 DIAGNOSIS — F23 Brief psychotic disorder: Secondary | ICD-10-CM | POA: Diagnosis not present

## 2020-04-24 DIAGNOSIS — R4781 Slurred speech: Secondary | ICD-10-CM | POA: Diagnosis not present

## 2020-04-24 DIAGNOSIS — I051 Rheumatic mitral insufficiency: Secondary | ICD-10-CM | POA: Diagnosis not present

## 2020-04-24 DIAGNOSIS — E1165 Type 2 diabetes mellitus with hyperglycemia: Secondary | ICD-10-CM | POA: Diagnosis not present

## 2020-04-26 ENCOUNTER — Ambulatory Visit: Payer: Medicare HMO | Admitting: Physical Therapy

## 2020-05-03 DIAGNOSIS — F23 Brief psychotic disorder: Secondary | ICD-10-CM | POA: Diagnosis not present

## 2020-05-03 DIAGNOSIS — F419 Anxiety disorder, unspecified: Secondary | ICD-10-CM | POA: Diagnosis not present

## 2020-05-03 DIAGNOSIS — E1165 Type 2 diabetes mellitus with hyperglycemia: Secondary | ICD-10-CM | POA: Diagnosis not present

## 2020-05-03 DIAGNOSIS — I119 Hypertensive heart disease without heart failure: Secondary | ICD-10-CM | POA: Diagnosis not present

## 2020-05-03 DIAGNOSIS — R262 Difficulty in walking, not elsewhere classified: Secondary | ICD-10-CM | POA: Diagnosis not present

## 2020-05-03 DIAGNOSIS — R4781 Slurred speech: Secondary | ICD-10-CM | POA: Diagnosis not present

## 2020-05-03 DIAGNOSIS — I051 Rheumatic mitral insufficiency: Secondary | ICD-10-CM | POA: Diagnosis not present

## 2020-05-03 DIAGNOSIS — F312 Bipolar disorder, current episode manic severe with psychotic features: Secondary | ICD-10-CM | POA: Diagnosis not present

## 2020-05-03 DIAGNOSIS — I69398 Other sequelae of cerebral infarction: Secondary | ICD-10-CM | POA: Diagnosis not present

## 2020-05-08 DIAGNOSIS — F312 Bipolar disorder, current episode manic severe with psychotic features: Secondary | ICD-10-CM | POA: Diagnosis not present

## 2020-05-08 DIAGNOSIS — F23 Brief psychotic disorder: Secondary | ICD-10-CM | POA: Diagnosis not present

## 2020-05-08 DIAGNOSIS — I69398 Other sequelae of cerebral infarction: Secondary | ICD-10-CM | POA: Diagnosis not present

## 2020-05-08 DIAGNOSIS — E1165 Type 2 diabetes mellitus with hyperglycemia: Secondary | ICD-10-CM | POA: Diagnosis not present

## 2020-05-08 DIAGNOSIS — I119 Hypertensive heart disease without heart failure: Secondary | ICD-10-CM | POA: Diagnosis not present

## 2020-05-08 DIAGNOSIS — R4781 Slurred speech: Secondary | ICD-10-CM | POA: Diagnosis not present

## 2020-05-08 DIAGNOSIS — I051 Rheumatic mitral insufficiency: Secondary | ICD-10-CM | POA: Diagnosis not present

## 2020-05-08 DIAGNOSIS — R262 Difficulty in walking, not elsewhere classified: Secondary | ICD-10-CM | POA: Diagnosis not present

## 2020-05-08 DIAGNOSIS — F419 Anxiety disorder, unspecified: Secondary | ICD-10-CM | POA: Diagnosis not present

## 2020-05-10 DIAGNOSIS — F23 Brief psychotic disorder: Secondary | ICD-10-CM | POA: Diagnosis not present

## 2020-05-10 DIAGNOSIS — F312 Bipolar disorder, current episode manic severe with psychotic features: Secondary | ICD-10-CM | POA: Diagnosis not present

## 2020-05-10 DIAGNOSIS — I69398 Other sequelae of cerebral infarction: Secondary | ICD-10-CM | POA: Diagnosis not present

## 2020-05-10 DIAGNOSIS — R262 Difficulty in walking, not elsewhere classified: Secondary | ICD-10-CM | POA: Diagnosis not present

## 2020-05-10 DIAGNOSIS — I119 Hypertensive heart disease without heart failure: Secondary | ICD-10-CM | POA: Diagnosis not present

## 2020-05-10 DIAGNOSIS — E1165 Type 2 diabetes mellitus with hyperglycemia: Secondary | ICD-10-CM | POA: Diagnosis not present

## 2020-05-10 DIAGNOSIS — I051 Rheumatic mitral insufficiency: Secondary | ICD-10-CM | POA: Diagnosis not present

## 2020-05-10 DIAGNOSIS — F419 Anxiety disorder, unspecified: Secondary | ICD-10-CM | POA: Diagnosis not present

## 2020-05-10 DIAGNOSIS — R4781 Slurred speech: Secondary | ICD-10-CM | POA: Diagnosis not present

## 2020-05-15 DIAGNOSIS — F23 Brief psychotic disorder: Secondary | ICD-10-CM | POA: Diagnosis not present

## 2020-05-15 DIAGNOSIS — I119 Hypertensive heart disease without heart failure: Secondary | ICD-10-CM | POA: Diagnosis not present

## 2020-05-15 DIAGNOSIS — F419 Anxiety disorder, unspecified: Secondary | ICD-10-CM | POA: Diagnosis not present

## 2020-05-15 DIAGNOSIS — F312 Bipolar disorder, current episode manic severe with psychotic features: Secondary | ICD-10-CM | POA: Diagnosis not present

## 2020-05-15 DIAGNOSIS — I051 Rheumatic mitral insufficiency: Secondary | ICD-10-CM | POA: Diagnosis not present

## 2020-05-15 DIAGNOSIS — R262 Difficulty in walking, not elsewhere classified: Secondary | ICD-10-CM | POA: Diagnosis not present

## 2020-05-15 DIAGNOSIS — I69398 Other sequelae of cerebral infarction: Secondary | ICD-10-CM | POA: Diagnosis not present

## 2020-05-15 DIAGNOSIS — R4781 Slurred speech: Secondary | ICD-10-CM | POA: Diagnosis not present

## 2020-05-15 DIAGNOSIS — E1165 Type 2 diabetes mellitus with hyperglycemia: Secondary | ICD-10-CM | POA: Diagnosis not present

## 2020-05-22 DIAGNOSIS — F312 Bipolar disorder, current episode manic severe with psychotic features: Secondary | ICD-10-CM | POA: Diagnosis not present

## 2020-05-22 DIAGNOSIS — I69398 Other sequelae of cerebral infarction: Secondary | ICD-10-CM | POA: Diagnosis not present

## 2020-05-22 DIAGNOSIS — F419 Anxiety disorder, unspecified: Secondary | ICD-10-CM | POA: Diagnosis not present

## 2020-05-22 DIAGNOSIS — F23 Brief psychotic disorder: Secondary | ICD-10-CM | POA: Diagnosis not present

## 2020-05-22 DIAGNOSIS — R262 Difficulty in walking, not elsewhere classified: Secondary | ICD-10-CM | POA: Diagnosis not present

## 2020-05-22 DIAGNOSIS — I051 Rheumatic mitral insufficiency: Secondary | ICD-10-CM | POA: Diagnosis not present

## 2020-05-22 DIAGNOSIS — R4781 Slurred speech: Secondary | ICD-10-CM | POA: Diagnosis not present

## 2020-05-22 DIAGNOSIS — E1165 Type 2 diabetes mellitus with hyperglycemia: Secondary | ICD-10-CM | POA: Diagnosis not present

## 2020-05-22 DIAGNOSIS — I119 Hypertensive heart disease without heart failure: Secondary | ICD-10-CM | POA: Diagnosis not present

## 2020-06-05 DIAGNOSIS — F23 Brief psychotic disorder: Secondary | ICD-10-CM | POA: Diagnosis not present

## 2020-06-05 DIAGNOSIS — F312 Bipolar disorder, current episode manic severe with psychotic features: Secondary | ICD-10-CM | POA: Diagnosis not present

## 2020-06-05 DIAGNOSIS — I051 Rheumatic mitral insufficiency: Secondary | ICD-10-CM | POA: Diagnosis not present

## 2020-06-05 DIAGNOSIS — I69398 Other sequelae of cerebral infarction: Secondary | ICD-10-CM | POA: Diagnosis not present

## 2020-06-05 DIAGNOSIS — R262 Difficulty in walking, not elsewhere classified: Secondary | ICD-10-CM | POA: Diagnosis not present

## 2020-06-05 DIAGNOSIS — F419 Anxiety disorder, unspecified: Secondary | ICD-10-CM | POA: Diagnosis not present

## 2020-06-05 DIAGNOSIS — I119 Hypertensive heart disease without heart failure: Secondary | ICD-10-CM | POA: Diagnosis not present

## 2020-06-05 DIAGNOSIS — R4781 Slurred speech: Secondary | ICD-10-CM | POA: Diagnosis not present

## 2020-06-05 DIAGNOSIS — E1165 Type 2 diabetes mellitus with hyperglycemia: Secondary | ICD-10-CM | POA: Diagnosis not present

## 2020-06-11 ENCOUNTER — Ambulatory Visit
Admission: RE | Admit: 2020-06-11 | Discharge: 2020-06-11 | Disposition: A | Discharge status: Home / Self Care | Payer: Medicare HMO | Source: Ambulatory Visit | Attending: Internal Medicine | Admitting: Internal Medicine

## 2020-06-11 ENCOUNTER — Other Ambulatory Visit: Payer: Self-pay

## 2020-06-11 DIAGNOSIS — Z1231 Encounter for screening mammogram for malignant neoplasm of breast: Secondary | ICD-10-CM | POA: Diagnosis not present

## 2020-06-18 DIAGNOSIS — Z8673 Personal history of transient ischemic attack (TIA), and cerebral infarction without residual deficits: Secondary | ICD-10-CM | POA: Diagnosis not present

## 2020-06-18 DIAGNOSIS — R4781 Slurred speech: Secondary | ICD-10-CM | POA: Diagnosis not present

## 2020-06-18 DIAGNOSIS — R410 Disorientation, unspecified: Secondary | ICD-10-CM | POA: Diagnosis not present

## 2020-06-18 DIAGNOSIS — R29898 Other symptoms and signs involving the musculoskeletal system: Secondary | ICD-10-CM | POA: Diagnosis not present

## 2020-07-03 DIAGNOSIS — E1165 Type 2 diabetes mellitus with hyperglycemia: Secondary | ICD-10-CM | POA: Diagnosis not present

## 2020-07-03 DIAGNOSIS — I10 Essential (primary) hypertension: Secondary | ICD-10-CM | POA: Diagnosis not present

## 2020-07-03 DIAGNOSIS — R8290 Unspecified abnormal findings in urine: Secondary | ICD-10-CM | POA: Diagnosis not present

## 2020-07-03 DIAGNOSIS — E7800 Pure hypercholesterolemia, unspecified: Secondary | ICD-10-CM | POA: Diagnosis not present

## 2020-07-03 DIAGNOSIS — Z79899 Other long term (current) drug therapy: Secondary | ICD-10-CM | POA: Diagnosis not present

## 2020-07-10 DIAGNOSIS — E113291 Type 2 diabetes mellitus with mild nonproliferative diabetic retinopathy without macular edema, right eye: Secondary | ICD-10-CM | POA: Diagnosis not present

## 2020-07-10 DIAGNOSIS — Z794 Long term (current) use of insulin: Secondary | ICD-10-CM | POA: Diagnosis not present

## 2020-07-10 DIAGNOSIS — I10 Essential (primary) hypertension: Secondary | ICD-10-CM | POA: Diagnosis not present

## 2020-07-10 DIAGNOSIS — E7800 Pure hypercholesterolemia, unspecified: Secondary | ICD-10-CM | POA: Diagnosis not present

## 2020-07-10 DIAGNOSIS — E538 Deficiency of other specified B group vitamins: Secondary | ICD-10-CM | POA: Diagnosis not present

## 2020-07-18 DIAGNOSIS — E1129 Type 2 diabetes mellitus with other diabetic kidney complication: Secondary | ICD-10-CM | POA: Diagnosis not present

## 2020-07-18 DIAGNOSIS — E538 Deficiency of other specified B group vitamins: Secondary | ICD-10-CM | POA: Diagnosis not present

## 2020-07-18 DIAGNOSIS — I10 Essential (primary) hypertension: Secondary | ICD-10-CM | POA: Diagnosis not present

## 2020-07-18 DIAGNOSIS — R809 Proteinuria, unspecified: Secondary | ICD-10-CM | POA: Diagnosis not present

## 2020-07-18 DIAGNOSIS — E1165 Type 2 diabetes mellitus with hyperglycemia: Secondary | ICD-10-CM | POA: Diagnosis not present

## 2020-07-18 DIAGNOSIS — E782 Mixed hyperlipidemia: Secondary | ICD-10-CM | POA: Diagnosis not present

## 2020-09-02 DIAGNOSIS — E113393 Type 2 diabetes mellitus with moderate nonproliferative diabetic retinopathy without macular edema, bilateral: Secondary | ICD-10-CM | POA: Diagnosis not present

## 2020-09-18 NOTE — Congregational Nurse Program (Signed)
HIPAA discussed; agrees to nurse visit today. [facility] nurse clinic at [facility] while picking up food. States she has known hypertension and BP cuff stopped working. Requests BP screening only. Routine follow up at [facility] clinic. BP 120/62. Denies other medical concerns today. To RTC prn. Nurse to look for resource for BP cuff.

## 2020-10-02 DIAGNOSIS — Z8673 Personal history of transient ischemic attack (TIA), and cerebral infarction without residual deficits: Secondary | ICD-10-CM | POA: Diagnosis not present

## 2020-10-02 DIAGNOSIS — R29898 Other symptoms and signs involving the musculoskeletal system: Secondary | ICD-10-CM | POA: Diagnosis not present

## 2020-10-02 DIAGNOSIS — Z79899 Other long term (current) drug therapy: Secondary | ICD-10-CM | POA: Diagnosis not present

## 2020-10-02 DIAGNOSIS — I10 Essential (primary) hypertension: Secondary | ICD-10-CM | POA: Diagnosis not present

## 2020-10-02 DIAGNOSIS — E113291 Type 2 diabetes mellitus with mild nonproliferative diabetic retinopathy without macular edema, right eye: Secondary | ICD-10-CM | POA: Diagnosis not present

## 2020-10-02 DIAGNOSIS — R4781 Slurred speech: Secondary | ICD-10-CM | POA: Diagnosis not present

## 2020-10-02 DIAGNOSIS — R8290 Unspecified abnormal findings in urine: Secondary | ICD-10-CM | POA: Diagnosis not present

## 2020-10-02 DIAGNOSIS — Z794 Long term (current) use of insulin: Secondary | ICD-10-CM | POA: Diagnosis not present

## 2020-10-23 DIAGNOSIS — E1129 Type 2 diabetes mellitus with other diabetic kidney complication: Secondary | ICD-10-CM | POA: Diagnosis not present

## 2020-10-23 DIAGNOSIS — E538 Deficiency of other specified B group vitamins: Secondary | ICD-10-CM | POA: Diagnosis not present

## 2020-10-23 DIAGNOSIS — E782 Mixed hyperlipidemia: Secondary | ICD-10-CM | POA: Diagnosis not present

## 2020-10-23 DIAGNOSIS — E1165 Type 2 diabetes mellitus with hyperglycemia: Secondary | ICD-10-CM | POA: Diagnosis not present

## 2020-10-23 DIAGNOSIS — I10 Essential (primary) hypertension: Secondary | ICD-10-CM | POA: Diagnosis not present

## 2020-10-23 DIAGNOSIS — R809 Proteinuria, unspecified: Secondary | ICD-10-CM | POA: Diagnosis not present

## 2020-12-08 ENCOUNTER — Other Ambulatory Visit: Payer: Self-pay

## 2020-12-08 ENCOUNTER — Emergency Department: Payer: Medicare HMO

## 2020-12-08 ENCOUNTER — Inpatient Hospital Stay
Admission: EM | Admit: 2020-12-08 | Discharge: 2020-12-11 | DRG: 177 | Disposition: A | Discharge status: Home Health | Payer: Medicare HMO | Attending: Internal Medicine | Admitting: Internal Medicine

## 2020-12-08 DIAGNOSIS — E871 Hypo-osmolality and hyponatremia: Secondary | ICD-10-CM | POA: Diagnosis present

## 2020-12-08 DIAGNOSIS — E860 Dehydration: Secondary | ICD-10-CM | POA: Diagnosis not present

## 2020-12-08 DIAGNOSIS — E440 Moderate protein-calorie malnutrition: Secondary | ICD-10-CM | POA: Diagnosis present

## 2020-12-08 DIAGNOSIS — E878 Other disorders of electrolyte and fluid balance, not elsewhere classified: Secondary | ICD-10-CM | POA: Diagnosis present

## 2020-12-08 DIAGNOSIS — E11319 Type 2 diabetes mellitus with unspecified diabetic retinopathy without macular edema: Secondary | ICD-10-CM | POA: Diagnosis present

## 2020-12-08 DIAGNOSIS — R7989 Other specified abnormal findings of blood chemistry: Secondary | ICD-10-CM | POA: Diagnosis not present

## 2020-12-08 DIAGNOSIS — Z6823 Body mass index (BMI) 23.0-23.9, adult: Secondary | ICD-10-CM | POA: Diagnosis not present

## 2020-12-08 DIAGNOSIS — I10 Essential (primary) hypertension: Secondary | ICD-10-CM | POA: Diagnosis not present

## 2020-12-08 DIAGNOSIS — U071 COVID-19: Principal | ICD-10-CM | POA: Diagnosis present

## 2020-12-08 DIAGNOSIS — Z79899 Other long term (current) drug therapy: Secondary | ICD-10-CM

## 2020-12-08 DIAGNOSIS — I639 Cerebral infarction, unspecified: Secondary | ICD-10-CM | POA: Diagnosis present

## 2020-12-08 DIAGNOSIS — K767 Hepatorenal syndrome: Secondary | ICD-10-CM | POA: Diagnosis present

## 2020-12-08 DIAGNOSIS — Z7982 Long term (current) use of aspirin: Secondary | ICD-10-CM

## 2020-12-08 DIAGNOSIS — E1169 Type 2 diabetes mellitus with other specified complication: Secondary | ICD-10-CM

## 2020-12-08 DIAGNOSIS — R509 Fever, unspecified: Secondary | ICD-10-CM

## 2020-12-08 DIAGNOSIS — R531 Weakness: Secondary | ICD-10-CM | POA: Diagnosis not present

## 2020-12-08 DIAGNOSIS — Z888 Allergy status to other drugs, medicaments and biological substances status: Secondary | ICD-10-CM

## 2020-12-08 DIAGNOSIS — I959 Hypotension, unspecified: Secondary | ICD-10-CM | POA: Diagnosis not present

## 2020-12-08 DIAGNOSIS — Z7902 Long term (current) use of antithrombotics/antiplatelets: Secondary | ICD-10-CM

## 2020-12-08 DIAGNOSIS — Z794 Long term (current) use of insulin: Secondary | ICD-10-CM

## 2020-12-08 DIAGNOSIS — I69322 Dysarthria following cerebral infarction: Secondary | ICD-10-CM

## 2020-12-08 DIAGNOSIS — R0902 Hypoxemia: Secondary | ICD-10-CM | POA: Diagnosis not present

## 2020-12-08 DIAGNOSIS — E11649 Type 2 diabetes mellitus with hypoglycemia without coma: Secondary | ICD-10-CM

## 2020-12-08 DIAGNOSIS — E785 Hyperlipidemia, unspecified: Secondary | ICD-10-CM | POA: Diagnosis present

## 2020-12-08 DIAGNOSIS — R9431 Abnormal electrocardiogram [ECG] [EKG]: Secondary | ICD-10-CM | POA: Diagnosis not present

## 2020-12-08 DIAGNOSIS — J1282 Pneumonia due to coronavirus disease 2019: Secondary | ICD-10-CM | POA: Diagnosis present

## 2020-12-08 DIAGNOSIS — E119 Type 2 diabetes mellitus without complications: Secondary | ICD-10-CM

## 2020-12-08 DIAGNOSIS — N179 Acute kidney failure, unspecified: Secondary | ICD-10-CM | POA: Diagnosis not present

## 2020-12-08 DIAGNOSIS — Z885 Allergy status to narcotic agent status: Secondary | ICD-10-CM

## 2020-12-08 DIAGNOSIS — R0602 Shortness of breath: Secondary | ICD-10-CM | POA: Diagnosis not present

## 2020-12-08 DIAGNOSIS — J189 Pneumonia, unspecified organism: Secondary | ICD-10-CM | POA: Diagnosis not present

## 2020-12-08 LAB — COMPREHENSIVE METABOLIC PANEL
ALT: 24 U/L (ref 0–44)
AST: 29 U/L (ref 15–41)
Albumin: 4 g/dL (ref 3.5–5.0)
Alkaline Phosphatase: 57 U/L (ref 38–126)
Anion gap: 15 (ref 5–15)
BUN: 25 mg/dL — ABNORMAL HIGH (ref 8–23)
CO2: 23 mmol/L (ref 22–32)
Calcium: 9.4 mg/dL (ref 8.9–10.3)
Chloride: 94 mmol/L — ABNORMAL LOW (ref 98–111)
Creatinine, Ser: 1.3 mg/dL — ABNORMAL HIGH (ref 0.44–1.00)
GFR, Estimated: 43 mL/min — ABNORMAL LOW (ref >60)
Glucose, Bld: 200 mg/dL — ABNORMAL HIGH (ref 70–99)
Potassium: 4.5 mmol/L (ref 3.5–5.1)
Sodium: 132 mmol/L — ABNORMAL LOW (ref 135–145)
Total Bilirubin: 0.6 mg/dL (ref 0.3–1.2)
Total Protein: 7.8 g/dL (ref 6.5–8.1)

## 2020-12-08 LAB — CBC WITH DIFFERENTIAL/PLATELET
Abs Immature Granulocytes: 0.01 10*3/uL (ref 0.00–0.07)
Basophils Absolute: 0 10*3/uL (ref 0.0–0.1)
Basophils Relative: 0 %
Eosinophils Absolute: 0 10*3/uL (ref 0.0–0.5)
Eosinophils Relative: 0 %
HCT: 35.8 % — ABNORMAL LOW (ref 36.0–46.0)
Hemoglobin: 12 g/dL (ref 12.0–15.0)
Immature Granulocytes: 0 %
Lymphocytes Relative: 19 %
Lymphs Abs: 1.2 10*3/uL (ref 0.7–4.0)
MCH: 30.2 pg (ref 26.0–34.0)
MCHC: 33.5 g/dL (ref 30.0–36.0)
MCV: 89.9 fL (ref 80.0–100.0)
Monocytes Absolute: 0.3 10*3/uL (ref 0.1–1.0)
Monocytes Relative: 5 %
Neutro Abs: 4.7 10*3/uL (ref 1.7–7.7)
Neutrophils Relative %: 76 %
Platelets: 241 10*3/uL (ref 150–400)
RBC: 3.98 MIL/uL (ref 3.87–5.11)
RDW: 12.9 % (ref 11.5–15.5)
WBC: 6.2 10*3/uL (ref 4.0–10.5)
nRBC: 0 % (ref 0.0–0.2)

## 2020-12-08 LAB — RESP PANEL BY RT-PCR (FLU A&B, COVID) ARPGX2
Influenza A by PCR: NEGATIVE
Influenza B by PCR: NEGATIVE
SARS Coronavirus 2 by RT PCR: POSITIVE — AB

## 2020-12-08 LAB — TROPONIN I (HIGH SENSITIVITY): Troponin I (High Sensitivity): 10 ng/L (ref <18)

## 2020-12-08 MED ORDER — PREDNISONE 50 MG PO TABS: 50.0000 mg | ORAL_TABLET | Freq: Every day | ORAL | Status: DC

## 2020-12-08 MED ORDER — LACTATED RINGERS IV BOLUS
1000.0000 mL | Freq: Once | INTRAVENOUS | Status: AC
  Administered 2020-12-08: 1000 mL via INTRAVENOUS

## 2020-12-08 MED ORDER — SODIUM CHLORIDE 0.9 % IV SOLN
200.0000 mg | Freq: Once | INTRAVENOUS | Status: AC
  Administered 2020-12-09: 200 mg via INTRAVENOUS
  Filled 2020-12-08: qty 200

## 2020-12-08 MED ORDER — METHYLPREDNISOLONE SODIUM SUCC 125 MG IJ SOLR
1.0000 mg/kg | Freq: Two times a day (BID) | INTRAMUSCULAR | Status: DC
Start: 2020-12-09 — End: 2020-12-09
  Administered 2020-12-09 (×2): 63.75 mg via INTRAVENOUS
  Filled 2020-12-08 (×2): qty 2

## 2020-12-08 MED ORDER — SODIUM CHLORIDE 0.9 % IV SOLN
100.0000 mg | Freq: Every day | INTRAVENOUS | Status: DC
  Administered 2020-12-10 – 2020-12-11 (×2): 100 mg via INTRAVENOUS
  Filled 2020-12-08 (×2): qty 20

## 2020-12-08 MED ORDER — ACETAMINOPHEN 500 MG PO TABS
1000.0000 mg | ORAL_TABLET | Freq: Once | ORAL | Status: AC
  Administered 2020-12-08: 1000 mg via ORAL
  Filled 2020-12-08: qty 2

## 2020-12-08 NOTE — ED Provider Notes (Signed)
-----------------------------------------   11:47 PM on 12/08/2020 -----------------------------------------  Patient had ambulation trial and required heavy assistance from nursing staff.  At baseline patient is very independent, ambulates without assistance and drives.  She was not hypoxic but exhausted by the end of the short ambulation trial.  Given generalized weakness, Covid+ and this unvaccinated patient and AKI, will discuss with hospital services for admission.   Paulette Blanch, MD 12/09/20 [phone]

## 2020-12-08 NOTE — ED Provider Notes (Incomplete)
-----------------------------------------   11:47 PM on 12/08/2020 -----------------------------------------  Patient had ambulation trial and required heavy assistance from nursing staff.  At baseline patient is very independent, ambulates without assistance and drives.  She was not hypoxic but exhausted by the end of the short ambulation trial.  Given generalized weakness, Covid+ and this unvaccinated patient and AKI, will discuss with hospital services for admission.

## 2020-12-08 NOTE — ED Provider Notes (Signed)
[facility] Emergency Department Provider Note   ____________________________________________   Event Date/Time   First MD Initiated Contact with Patient 12/08/20 2136     (approximate)  I have reviewed the triage vital signs and the nursing notes.   HISTORY  Chief Complaint Weakness    HPI Sabrina [name] is a 75 y.o. female with past medical history of hypertension, hyperlipidemia, diabetes, and stroke who presents to the ED complaining of generalized weakness.  Patient reports that she has been feeling weaker than usual across the entirety of her body for the past 24 hours.  Weakness does not affect any part of her body specifically and she denies any numbness, vision changes, or speech changes.  She has not had any fevers, cough, chest pain, shortness of breath, vomiting, diarrhea, dysuria, or hematuria.  She denies any sick contacts.  Family was concerned that patient was not taking her medications, however patient disputes this.        Past Medical History:  Diagnosis Date  . Diabetes mellitus without complication ([place])   . High cholesterol   . Hypertension     Patient Active Problem List   Diagnosis Date Noted  . Acute CVA (cerebrovascular accident) ([place]) 01/31/2020  . Episode of transient neurologic symptoms 01/30/2020  . Hyperglycemia due to type 2 diabetes mellitus ([place]) 01/30/2020  . Incomplete uterine prolapse 12/08/2017  . Cystocele, midline 12/08/2017  . Bipolar affective disorder, manic, severe, with psychotic behavior ([place])   . Diabetes ([place]) 04/01/2016  . Dyslipidemia 04/01/2016  . Brief psychotic disorder ([place]) 03/28/2016  . Noncompliance 03/28/2016  . Essential hypertension 03/09/2016    Past Surgical History:  Procedure Laterality Date  . DILATION AND CURETTAGE OF UTERUS    . POLYPECTOMY      Prior to Admission medications   Medication Sig Start Date End Date Taking? Authorizing Provider  amLODipine (NORVASC) 5 MG  tablet Take 1 tablet (5 mg total) by mouth daily. 02/01/20 01/31/21  Danford, Suann Larry, MD  ASPIRIN LOW DOSE 81 MG EC tablet Take 81 mg by mouth daily. 07/10/20   [provider]  benazepril (LOTENSIN) 20 MG tablet Take 20 mg by mouth 2 (two) times daily.  11/10/19   [provider]  clopidogrel (PLAVIX) 75 MG tablet Take 75 mg by mouth daily. 10/30/20   [provider]  isosorbide mononitrate (IMDUR) 60 MG 24 hr tablet Take 60 mg by mouth 2 (two) times daily.  01/22/20   [provider]  LEVEMIR FLEXTOUCH 100 UNIT/ML FlexPen Inject 20 Units into the skin at bedtime. 09/30/20   [provider]  metFORMIN (GLUCOPHAGE) 500 MG tablet Take 1,000 mg by mouth 2 (two) times daily. 09/30/20   [provider]  rosuvastatin (CRESTOR) 40 MG tablet Take 0.5 tablets (20 mg total) by mouth at bedtime. 02/01/20   Danford, Suann Larry, MD  spironolactone (ALDACTONE) 25 MG tablet Take 25 mg by mouth daily. 02/18/16   [provider]    Allergies Glimepiride, Glipizide, Lisinopril, Losartan, Norco [hydrocodone-acetaminophen], and Pioglitazone  Family History  Problem Relation Age of Onset  . Breast cancer Maternal Aunt        2 aunts in their 58's  . Breast cancer Maternal Aunt 70  . Ovarian cancer Neg Hx   . Colon cancer Neg Hx     Social History Social History   Tobacco Use  . Smoking status: Never Smoker  . Smokeless tobacco: Never Used  Vaping Use  .  Vaping Use: Never used  Substance Use Topics  . Alcohol use: No  . Drug use: No    Review of Systems  Constitutional: No fever/chills.  Positive for generalized weakness. Eyes: No visual changes. ENT: No sore throat. Cardiovascular: Denies chest pain. Respiratory: Denies shortness of breath. Gastrointestinal: No abdominal pain.  No nausea, no vomiting.  No diarrhea.  No constipation. Genitourinary: Negative for dysuria. Musculoskeletal: Negative for back pain. Skin: Negative for  rash. Neurological: Negative for headaches, focal weakness or numbness.  ____________________________________________   PHYSICAL EXAM:  VITAL SIGNS: ED Triage Vitals  Enc Vitals Group     BP 12/08/20 2140 132/64     Pulse Rate 12/08/20 2140 95     Resp 12/08/20 2140 20     Temp --      Temp src --      SpO2 12/08/20 2140 95 %     Weight 12/08/20 2141 140 lb 6.9 oz (63.7 kg)     Height 12/08/20 2141 5\' 1"  (1.549 m)     Head Circumference --      Peak Flow --      Pain Score 12/08/20 2141 0     Pain Loc --      Pain Edu? --      Excl. in [place]? --     Constitutional: Alert and oriented. Eyes: Conjunctivae are normal. Head: Atraumatic. Nose: No congestion/rhinnorhea. Mouth/Throat: Mucous membranes are moist. Neck: Normal ROM Cardiovascular: Normal rate, regular rhythm. Grossly normal heart sounds. Respiratory: Normal respiratory effort.  No retractions. Lungs CTAB. Gastrointestinal: Soft and nontender. No distention. Genitourinary: deferred Musculoskeletal: No lower extremity tenderness nor edema. Neurologic:  Normal speech and language. No gross focal neurologic deficits are appreciated. Skin:  Skin is warm, dry and intact. No rash noted. Psychiatric: Mood and affect are normal. Speech and behavior are normal.  ____________________________________________   LABS (all labs ordered are listed, but only abnormal results are displayed)  Labs Reviewed  RESP PANEL BY RT-PCR (FLU A&B, COVID) ARPGX2 - Abnormal; Notable for the following components:      Result Value   SARS Coronavirus 2 by RT PCR POSITIVE (*)    All other components within normal limits  CBC WITH DIFFERENTIAL/PLATELET - Abnormal; Notable for the following components:   HCT 35.8 (*)    All other components within normal limits  COMPREHENSIVE METABOLIC PANEL - Abnormal; Notable for the following components:   Sodium 132 (*)    Chloride 94 (*)    Glucose, Bld 200 (*)    BUN 25 (*)    Creatinine, Ser 1.30  (*)    GFR, Estimated 43 (*)    All other components within normal limits  URINALYSIS, COMPLETE (UACMP) WITH MICROSCOPIC  TROPONIN I (HIGH SENSITIVITY)  TROPONIN I (HIGH SENSITIVITY)   ____________________________________________  EKG  ED ECG REPORT I, Blake Divine, the attending physician, personally viewed and interpreted this ECG.   Date: 12/08/2020  EKG Time: 21:41  Rate: 93  Rhythm: normal sinus rhythm  Axis: Normal  Intervals:none  ST&T Change: None   PROCEDURES  Procedure(s) performed (including Critical Care):  Procedures   ____________________________________________   INITIAL IMPRESSION / ASSESSMENT AND PLAN / ED COURSE       75 year old female with past medical history of hypertension, hyperlipidemia, diabetes, and stroke who presents to the ED for generalized weakness worsening over the past 24 hours.  She denies any specific symptoms other than generalized weakness and there are no focal deficits on her  exam to suggest stroke.  EKG shows no evidence of arrhythmia or ischemia.  We will screen for infectious process with UA and chest x-ray, check labs including electrolytes and troponin.  Patient noted to be febrile, concerning for infectious etiology of her generalized weakness, however remainder of vital signs are reassuring and I have low suspicion for sepsis given her well appearance.  Chest x-ray reviewed by me and shows no infiltrate, edema, or effusion.  Remainder of labs are unremarkable, troponin within normal limits.  Testing for COVID-19 is positive, which would explain her weakness.  She continues to maintain O2 sats on room air.  We have will have patient trial ambulation, but if this is unsuccessful she will require admission.      ____________________________________________   FINAL CLINICAL IMPRESSION(S) / ED DIAGNOSES  Final diagnoses:  COVID-19  Generalized weakness     ED Discharge Orders    None       Note:  This document  was prepared using Dragon voice recognition software and may include unintentional dictation errors.   Blake Divine, MD 12/08/20 2320

## 2020-12-08 NOTE — ED Triage Notes (Signed)
Pt arrives from home via [facility] with complaint of weakness, swelling in legs, decreased appetite. Family reports symptoms have been going on about a week, worse over last couple days.  Family reports pt has not been taking her medications, pt states she is.   hx stroke (last march), htn, db  132/56 96 hr 93 ra 238 cbg

## 2020-12-08 NOTE — ED Notes (Signed)
Upon assessment, pt legs do not appear to have any substantial edema. Pt a/o x4. Pt voices frustration about "there  Not being anything wrong with me, I don't know why they sent me here"

## 2020-12-08 NOTE — H&P (Signed)
[facility]   PATIENT NAME: Sabrina [name]    MR#:  [id number]  DATE OF BIRTH:  20-Mar-1946  DATE OF ADMISSION:  12/08/2020  PRIMARY CARE PHYSICIAN: [name], MD   Patient is coming from: Home  REQUESTING/REFERRING PHYSICIAN: Lurline Hare, MD  CHIEF COMPLAINT:   Chief Complaint  Patient presents with  . Weakness    HISTORY OF PRESENT ILLNESS:  Sabrina [name] is a 75 y.o.  female with medical history significant for type diabetes mellitus, hypertension and dyslipidemia, who presented to the emergency room with acute onset of generalized weakness over the last 3 days with significantly diminished appetite.  She has fever without chills.  She denies any nausea vomiting or diarrhea.  She denies any loss of taste or smell.  No cough or wheezing or dyspnea.  She denies any chest pain or palpitations.  No dysuria, oliguria or hematuria, urgency or frequency or flank pain. ED Course: Upon presentation to the emergency room vital signs were within normal except for temperature of 101.  Labs revealed mild hyponatremia hypochloremia, glucose of 200, BUN of 25 and creatinine 1.3 compared to17/0.71 on 02/01/2020.  BNP was 34.8 and high-sensitivity troponin I was 10.  Ferritin came back elevated at 352 and procalcitonin 0.1.  CBC was unremarkable and fibrinogen came back elevated at 527 with 500 with his D-dimer of 1040.96.  Composite antigen ischemic negative.  COVID-19 PCR came back positive.   EKG as reviewed by me :Showed sinus rhythm with a rate of 93 with poor R wave progression and Q waves inferiorly. Imaging: Two-view chest x-ray showed no acute cardiopulmonary disease.  The patient was given 1 g p.o. Tylenol, 1 L bolus of IV lactated Ringer, IV Solu-Medrol and IV remdesivir.  She will be admitted to a medical bed for further evaluation and management. PAST MEDICAL HISTORY:   Past Medical History:  Diagnosis Date  . Diabetes mellitus without complication ([place])   . High  cholesterol   . Hypertension     PAST SURGICAL HISTORY:   Past Surgical History:  Procedure Laterality Date  . DILATION AND CURETTAGE OF UTERUS    . POLYPECTOMY      SOCIAL HISTORY:   Social History   Tobacco Use  . Smoking status: Never Smoker  . Smokeless tobacco: Never Used  Substance Use Topics  . Alcohol use: No    FAMILY HISTORY:   Family History  Problem Relation Age of Onset  . Breast cancer Maternal Aunt        2 aunts in their 79's  . Breast cancer Maternal Aunt 70  . Ovarian cancer Neg Hx   . Colon cancer Neg Hx     DRUG ALLERGIES:   Allergies  Allergen Reactions  . Glimepiride Diarrhea and Other (See Comments)  . Glipizide Other (See Comments)  . Lisinopril Other (See Comments)    headache  . Losartan Other (See Comments)    Headache  . Norco [Hydrocodone-Acetaminophen] Other (See Comments)    Mood changes  . Pioglitazone Swelling    On legs    REVIEW OF SYSTEMS:   ROS As per history of present illness. All pertinent systems were reviewed above. Constitutional, HEENT, cardiovascular, respiratory, GI, GU, musculoskeletal, neuro, psychiatric, endocrine, integumentary and hematologic systems were reviewed and are otherwise negative/unremarkable except for positive findings mentioned above in the HPI.   MEDICATIONS AT HOME:   Prior to Admission medications   Medication Sig Start Date End Date Taking?  Authorizing Provider  amLODipine (NORVASC) 5 MG tablet Take 1 tablet (5 mg total) by mouth daily. 02/01/20 01/31/21 Yes Danford, Suann Larry, MD  ASPIRIN LOW DOSE 81 MG EC tablet Take 81 mg by mouth daily. 07/10/20  Yes [provider]  benazepril (LOTENSIN) 20 MG tablet Take 20 mg by mouth 2 (two) times daily.  11/10/19  Yes [provider]  clopidogrel (PLAVIX) 75 MG tablet Take 75 mg by mouth daily. 10/30/20  Yes [provider]  isosorbide mononitrate (IMDUR) 60 MG 24 hr tablet Take 60 mg by mouth 2 (two) times daily.   01/22/20  Yes [provider]  LEVEMIR FLEXTOUCH 100 UNIT/ML FlexPen Inject 20 Units into the skin at bedtime. 09/30/20  Yes [provider]  metFORMIN (GLUCOPHAGE) 500 MG tablet Take 1,000 mg by mouth 2 (two) times daily. 09/30/20  Yes [provider]  rosuvastatin (CRESTOR) 40 MG tablet Take 0.5 tablets (20 mg total) by mouth at bedtime. 02/01/20  Yes Danford, Suann Larry, MD  spironolactone (ALDACTONE) 25 MG tablet Take 25 mg by mouth daily. 02/18/16  Yes [provider]      VITAL SIGNS:  Blood pressure (!) 116/57, pulse 86, temperature (!) 101 F (38.3 C), temperature source Oral, resp. rate 14, height 5\' 1"  (1.549 m), weight 63.7 kg, SpO2 95 %.  PHYSICAL EXAMINATION:  Physical Exam  GENERAL:  75 y.o.-year-old female patient lying in the bed with no acute distress.  EYES: Pupils equal, round, reactive to light and accommodation. No scleral icterus. Extraocular muscles intact.  HEENT: Head atraumatic, normocephalic. Oropharynx with slightly dry mucous membrane and tongue and nasopharynx clear.  NECK:  Supple, no jugular venous distention. No thyroid enlargement, no tenderness.  LUNGS: Normal breath sounds bilaterally, no wheezing, rales,rhonchi or crepitation. No use of accessory muscles of respiration.  CARDIOVASCULAR: Regular rate and rhythm, S1, S2 normal. No murmurs, rubs, or gallops.  ABDOMEN: Soft, nondistended, nontender. Bowel sounds present. No organomegaly or mass.  EXTREMITIES: No pedal edema, cyanosis, or clubbing.  NEUROLOGIC: Cranial nerves II through XII are intact. Muscle strength 5/5 in all extremities. Sensation intact. Gait not checked.  PSYCHIATRIC: The patient is alert and oriented x 3.  Normal affect and good eye contact. SKIN: No obvious rash, lesion, or ulcer.   LABORATORY PANEL:   CBC Recent Labs  Lab 12/08/20 2144  WBC 6.2  HGB 12.0  HCT 35.8*  PLT 241    ------------------------------------------------------------------------------------------------------------------  Chemistries  Recent Labs  Lab 12/08/20 2144  NA 132*  K 4.5  CL 94*  CO2 23  GLUCOSE 200*  BUN 25*  CREATININE 1.30*  CALCIUM 9.4  AST 29  ALT 24  ALKPHOS 57  BILITOT 0.6   ------------------------------------------------------------------------------------------------------------------  Cardiac Enzymes No results for input(s): TROPONINI in the last 168 hours. ------------------------------------------------------------------------------------------------------------------  RADIOLOGY:  DG Chest 2 View  Result Date: 12/08/2020 CLINICAL DATA:  Weakness, swelling of legs, decreased appetite. EXAM: CHEST - 2 VIEW COMPARISON:  Chest x-ray 01/30/2020, CT chest 03/09/2016 FINDINGS: The heart size and mediastinal contours are unchanged. Aortic arch calcification. No focal consolidation. No pulmonary edema. No pleural effusion. No pneumothorax. No acute osseous abnormality. Multilevel degenerative changes of the spine. IMPRESSION: No active cardiopulmonary disease. Electronically Signed   By: Iven Finn M.D.   On: 12/08/2020 22:47      IMPRESSION AND PLAN:  Active Problems:   AKI (acute kidney injury) ([place])  1.  COVID-19 infection with associated generalized weakness, anorexia and fever. -The patient will  be admitted to medical monitored isolation bed. -Given elevated inflammatory markers will place on IV steroids. -The patient will be continued on IV remdesivir. -She will be placed on vitamin D3, vitamin C, zinc sulfate and aspirin.  2.  Acute kidney injury, likely prerenal due to volume depletion and dehydration. -She will be hydrated with IV normal saline and will follow BMP.  3.  Elevated D-dimer. -We will obtain a chest CTA to rule out PE associated with COVID-19.  4.  Dyslipidemia. -We will continue her statin therapy.  5.  Type 2 diabetes  mellitus. -The patient will be placed on supplement coverage with NovoLog we will continue with basal coverage.  6.  Essential hypertension. -We will hold off her benazepril given acute kidney injury and continue her Norvasc.  DVT prophylaxis: Lovenox. Code Status: full code.  Family Communication:  The plan of care was discussed in details with the patient and her daughter who was with her in the room.  I answered all questions. The patient agreed to proceed with the above mentioned plan. Further management will depend upon hospital course. Disposition Plan: Back to previous home environment Consults called: none. All the records are reviewed and case discussed with ED provider.  Status is: Inpatient  Remains inpatient appropriate because:Ongoing diagnostic testing needed not appropriate for outpatient work up, Unsafe d/c plan, IV treatments appropriate due to intensity of illness or inability to take PO and Inpatient level of care appropriate due to severity of illness   Dispo: The patient is from: Home              Anticipated d/c is to: Home              Anticipated d/c date is: 2 days              Patient currently is not medically stable to d/c.   Difficult to place patient No  TOTAL TIME TAKING CARE OF THIS PATIENT: 55 minutes.    Christel Mormon M.D on 12/08/2020 at 11:55 PM  [facility]   From 7 PM-7 AM, contact night-coverage [email or web address]  CC: Primary care physician; [name], MD

## 2020-12-09 ENCOUNTER — Inpatient Hospital Stay: Payer: Medicare HMO

## 2020-12-09 ENCOUNTER — Encounter: Payer: Self-pay | Admitting: Family Medicine

## 2020-12-09 DIAGNOSIS — N179 Acute kidney failure, unspecified: Secondary | ICD-10-CM | POA: Diagnosis not present

## 2020-12-09 DIAGNOSIS — I10 Essential (primary) hypertension: Secondary | ICD-10-CM | POA: Diagnosis not present

## 2020-12-09 DIAGNOSIS — U071 COVID-19: Secondary | ICD-10-CM | POA: Diagnosis not present

## 2020-12-09 LAB — LACTATE DEHYDROGENASE: LDH: 208 U/L — ABNORMAL HIGH (ref 98–192)

## 2020-12-09 LAB — FERRITIN: Ferritin: 352 ng/mL — ABNORMAL HIGH (ref 11–307)

## 2020-12-09 LAB — GLUCOSE, CAPILLARY
Glucose-Capillary: 297 mg/dL — ABNORMAL HIGH (ref 70–99)
Glucose-Capillary: 205 mg/dL — ABNORMAL HIGH (ref 70–99)
Glucose-Capillary: 159 mg/dL — ABNORMAL HIGH (ref 70–99)

## 2020-12-09 LAB — FIBRIN DERIVATIVES D-DIMER (ARMC ONLY): Fibrin derivatives D-dimer (ARMC): 1040.96 ng/mL (FEU) — ABNORMAL HIGH (ref 0.00–499.00)

## 2020-12-09 LAB — FIBRINOGEN: Fibrinogen: 527 mg/dL — ABNORMAL HIGH (ref 210–475)

## 2020-12-09 LAB — HEPATITIS B SURFACE ANTIGEN: Hepatitis B Surface Ag: NONREACTIVE

## 2020-12-09 LAB — TROPONIN I (HIGH SENSITIVITY): Troponin I (High Sensitivity): 9 ng/L (ref <18)

## 2020-12-09 LAB — C-REACTIVE PROTEIN: CRP: 5.6 mg/dL — ABNORMAL HIGH (ref <1.0)

## 2020-12-09 LAB — PROCALCITONIN: Procalcitonin: 0.1 ng/mL

## 2020-12-09 LAB — BRAIN NATRIURETIC PEPTIDE: B Natriuretic Peptide: 34.8 pg/mL (ref 0.0–100.0)

## 2020-12-09 LAB — ABO/RH: ABO/RH(D): A POS

## 2020-12-09 MED ORDER — BENAZEPRIL HCL 20 MG PO TABS: 20.0000 mg | ORAL_TABLET | Freq: Two times a day (BID) | ORAL | Status: DC

## 2020-12-09 MED ORDER — CLOPIDOGREL BISULFATE 75 MG PO TABS
75.0000 mg | ORAL_TABLET | Freq: Every day | ORAL | Status: DC
  Administered 2020-12-09 – 2020-12-11 (×3): 75 mg via ORAL
  Filled 2020-12-09 (×3): qty 1

## 2020-12-09 MED ORDER — TRAZODONE HCL 50 MG PO TABS
25.0000 mg | ORAL_TABLET | Freq: Every evening | ORAL | Status: DC | PRN
  Filled 2020-12-09 (×2): qty 1

## 2020-12-09 MED ORDER — INSULIN ASPART 100 UNIT/ML ~~LOC~~ SOLN
0.0000 [IU] | Freq: Three times a day (TID) | SUBCUTANEOUS | Status: DC
  Administered 2020-12-09: 12:00:00 8 [IU] via SUBCUTANEOUS
  Administered 2020-12-09: 18:00:00 5 [IU] via SUBCUTANEOUS
  Administered 2020-12-10: 3 [IU] via SUBCUTANEOUS
  Filled 2020-12-09 (×4): qty 1

## 2020-12-09 MED ORDER — SPIRONOLACTONE 25 MG PO TABS
25.0000 mg | ORAL_TABLET | Freq: Every day | ORAL | Status: DC
  Administered 2020-12-09 – 2020-12-11 (×3): 25 mg via ORAL
  Filled 2020-12-09 (×4): qty 1

## 2020-12-09 MED ORDER — FAMOTIDINE 20 MG PO TABS
20.0000 mg | ORAL_TABLET | Freq: Two times a day (BID) | ORAL | Status: DC
  Administered 2020-12-09 – 2020-12-10 (×4): 20 mg via ORAL
  Filled 2020-12-09 (×5): qty 1

## 2020-12-09 MED ORDER — ASPIRIN EC 81 MG PO TBEC
81.0000 mg | DELAYED_RELEASE_TABLET | Freq: Every day | ORAL | Status: DC
  Administered 2020-12-09 – 2020-12-11 (×3): 81 mg via ORAL
  Filled 2020-12-09 (×3): qty 1

## 2020-12-09 MED ORDER — ASPIRIN EC 81 MG PO TBEC: 81.0000 mg | DELAYED_RELEASE_TABLET | Freq: Every day | ORAL | Status: DC

## 2020-12-09 MED ORDER — INSULIN GLARGINE 100 UNIT/ML ~~LOC~~ SOLN
20.0000 [IU] | Freq: Every day | SUBCUTANEOUS | Status: DC
  Administered 2020-12-10: 01:00:00 20 [IU] via SUBCUTANEOUS
  Filled 2020-12-09 (×4): qty 0.2

## 2020-12-09 MED ORDER — ISOSORBIDE MONONITRATE ER 30 MG PO TB24
60.0000 mg | ORAL_TABLET | Freq: Two times a day (BID) | ORAL | Status: DC
  Administered 2020-12-09 – 2020-12-11 (×5): 60 mg via ORAL
  Filled 2020-12-09 (×5): qty 2

## 2020-12-09 MED ORDER — ONDANSETRON HCL 4 MG PO TABS: 4.0000 mg | ORAL_TABLET | Freq: Four times a day (QID) | ORAL | Status: DC | PRN

## 2020-12-09 MED ORDER — SODIUM CHLORIDE 0.9 % IV SOLN: INTRAVENOUS | Status: DC

## 2020-12-09 MED ORDER — INSULIN DETEMIR 100 UNIT/ML FLEXPEN: 20.0000 [IU] | PEN_INJECTOR | Freq: Every day | SUBCUTANEOUS | Status: DC

## 2020-12-09 MED ORDER — ENOXAPARIN SODIUM 40 MG/0.4ML ~~LOC~~ SOLN
40.0000 mg | SUBCUTANEOUS | Status: DC
  Administered 2020-12-09 – 2020-12-11 (×3): 40 mg via SUBCUTANEOUS
  Filled 2020-12-09 (×3): qty 0.4

## 2020-12-09 MED ORDER — AMLODIPINE BESYLATE 5 MG PO TABS
5.0000 mg | ORAL_TABLET | Freq: Every day | ORAL | Status: DC
Start: 2020-12-09 — End: 2020-12-11
  Administered 2020-12-09 – 2020-12-11 (×3): 5 mg via ORAL
  Filled 2020-12-09 (×3): qty 1

## 2020-12-09 MED ORDER — MAGNESIUM HYDROXIDE 400 MG/5ML PO SUSP
30.0000 mL | Freq: Every day | ORAL | Status: DC | PRN
  Filled 2020-12-09: qty 30

## 2020-12-09 MED ORDER — SODIUM CHLORIDE 0.9 % IV SOLN: 200.0000 mg | Freq: Once | INTRAVENOUS | Status: DC

## 2020-12-09 MED ORDER — ROSUVASTATIN CALCIUM 20 MG PO TABS
20.0000 mg | ORAL_TABLET | Freq: Every day | ORAL | Status: DC
  Administered 2020-12-09 – 2020-12-10 (×2): 20 mg via ORAL
  Filled 2020-12-09 (×3): qty 1

## 2020-12-09 MED ORDER — ONDANSETRON HCL 4 MG/2ML IJ SOLN: 4.0000 mg | Freq: Four times a day (QID) | INTRAMUSCULAR | Status: DC | PRN

## 2020-12-09 MED ORDER — ZINC SULFATE 220 (50 ZN) MG PO CAPS
220.0000 mg | ORAL_CAPSULE | Freq: Every day | ORAL | Status: DC
  Administered 2020-12-09 – 2020-12-11 (×3): 220 mg via ORAL
  Filled 2020-12-09 (×3): qty 1

## 2020-12-09 MED ORDER — ACETAMINOPHEN 325 MG PO TABS: 650.0000 mg | ORAL_TABLET | Freq: Four times a day (QID) | ORAL | Status: DC | PRN

## 2020-12-09 MED ORDER — SODIUM CHLORIDE 0.9 % IV SOLN: 100.0000 mg | Freq: Every day | INTRAVENOUS | Status: DC

## 2020-12-09 MED ORDER — IOHEXOL 350 MG/ML SOLN
75.0000 mL | Freq: Once | INTRAVENOUS | Status: AC | PRN
  Administered 2020-12-09: 75 mL via INTRAVENOUS

## 2020-12-09 MED ORDER — ASCORBIC ACID 500 MG PO TABS
500.0000 mg | ORAL_TABLET | Freq: Every day | ORAL | Status: DC
  Administered 2020-12-09 – 2020-12-11 (×3): 500 mg via ORAL
  Filled 2020-12-09 (×3): qty 1

## 2020-12-09 MED ORDER — INSULIN ASPART 100 UNIT/ML ~~LOC~~ SOLN: 0.0000 [IU] | Freq: Every day | SUBCUTANEOUS | Status: DC

## 2020-12-09 MED ORDER — INSULIN ASPART 100 UNIT/ML ~~LOC~~ SOLN: 0.0000 [IU] | Freq: Three times a day (TID) | SUBCUTANEOUS | Status: DC

## 2020-12-09 MED ORDER — LABETALOL HCL 5 MG/ML IV SOLN: 20.0000 mg | INTRAVENOUS | Status: DC | PRN

## 2020-12-09 NOTE — Progress Notes (Signed)
Remdesivir - Pharmacy Brief Note   O:  CXR: "No focal consolidation. No pulmonary edema. No pleural effusion. No pneumothorax." SpO2: 95-97% on RA   A/P:  Remdesivir 200 mg IVPB once followed by 100 mg IVPB daily x 4 days.   Renda Rolls, PharmD, [facility] 12/09/2020 2:24 AM

## 2020-12-09 NOTE — Progress Notes (Addendum)
PROGRESS NOTE    Sabrina [name]   NIO:[id number]  DOB: 10/03/1946  PCP: [name], MD    DOA: 12/08/2020 LOS: 1   Brief Narrative / Hospital Course to-date   75 year old female with past medical history of type 2 diabetes with retinopathy, hypertension, hyperlipidemia, history of CVA, presented to the ED on 12/08/2020 with complaints of progressive generalized weakness and poor appetite for the past 3 days.  No fevers or chills or respiratory complaints.  In the ED, COVID-19 PCR positive.  She had a fever of 101 on arrival, with otherwise stable vitals.  Labs notable for mild hyponatremia, hypochloremia, glucose 200, BUN 25 and creatinine 1.30.  Chest x-ray read as negative.  CTA chest was negative for PE but did show mild multifocal groundglass opacities throughout both lungs typical of COVID-19 pneumonia.  Admitted to hospitalist service and started on remdesivir and supportive care.    Significant Events: -admitted 12/08/2020  Date of +Covid Test: 12/08/2020  Vaccination status: Appears is unvaccinated  Oxygen requirements: None  Covid specific treatments: Remdesivir 2/13 >>  Assessment & Plan   Principal Problem:   COVID-19 virus infection Active Problems:   AKI (acute kidney injury) ([place])   Essential hypertension   Type 2 diabetes mellitus (HCC)   Dyslipidemia   Acute CVA (cerebrovascular accident) ([place])   COVID-19 infection -with symptoms of fevers, generalized weakness and poor appetite.  No respiratory symptoms reported but does have mild groundglass opacities on imaging.  She is not hypoxic however. --Stop steroids given no hypoxia, will resume if she develops this --Continue remdesivir --Continue supportive care with vitamins, zinc, aspirin --Follow inflammatory markers, CBC, CMP  Acute kidney injury -suspected prerenal azotemia in the setting of dehydration and poor p.o. intake.  Continue IV fluids.  Monitor BMP.  Avoid nephrotoxic agents as much as  possible.  Renally dose meds  Hyperlipidemia -continue statin  Type 2 diabetes -continued on basal coverage in addition to sliding scale NovoLog  Essential hypertension -patient's benazepril is held for AKI.  Continued on home Norvasc.  History of CVA -some residual mild dysarthria.  Continue Plavix and aspirin.      DVT prophylaxis: enoxaparin (LOVENOX) injection 40 mg Start: 12/09/20 1000   Diet:  Diet Orders (From admission, onward)    Start     Ordered   12/09/20 0133  Diet Heart Room service appropriate? Yes; Fluid consistency: Thin  Diet effective now       Question Answer Comment  Room service appropriate? Yes   Fluid consistency: Thin      12/09/20 0134            Code Status: Full Code    Subjective 12/09/20    Patient seen sitting edge of bed during lunch today.  She reports feeling slightly better.  Still feels quite weak, much more so than her baseline.  She reports getting a little bit of heartburn while talking to me which she states happens if she is talking well or shortly after eating.  She denies any more fevers or chills, chest pain, palpitations, nausea vomiting or other acute complaints.   Disposition Plan & Communication   Status is: Inpatient  Remains inpatient appropriate because:IV treatments appropriate due to intensity of illness or inability to take PO   Dispo: The patient is from: Home              Anticipated d/c is to: Home  Anticipated d/c date is: 1 to 2 days              Patient currently is not medically stable to d/c.   Difficult to place patient No   Family Communication: None at bedside, will attempt to call this afternoon   Consults, Procedures, Treatments   Consultants:   None  Procedures:   None  Covid-specific treatments:  Antimicrobials:  Anti-infectives (From admission, onward)   Start     Dose/Rate Route Frequency Ordered Stop   12/10/20 1000  remdesivir 100 mg in sodium chloride 0.9 % 100  mL IVPB       "Followed by" Linked Group Details   100 mg 200 mL/hr over 30 Minutes Intravenous Daily 12/08/20 2346 12/14/20 0959   12/10/20 1000  remdesivir 100 mg in sodium chloride 0.9 % 100 mL IVPB  Status:  Discontinued       "Followed by" Linked Group Details   100 mg 200 mL/hr over 30 Minutes Intravenous Daily 12/09/20 0134 12/09/20 0139   12/09/20 0145  remdesivir 200 mg in sodium chloride 0.9% 250 mL IVPB  Status:  Discontinued       "Followed by" Linked Group Details   200 mg 580 mL/hr over 30 Minutes Intravenous Once 12/09/20 0134 12/09/20 0139   12/09/20 0100  remdesivir 200 mg in sodium chloride 0.9% 250 mL IVPB       "Followed by" Linked Group Details   200 mg 580 mL/hr over 30 Minutes Intravenous Once 12/08/20 2346 12/09/20 0503        Objective   Vitals:   12/09/20 0730 12/09/20 0830 12/09/20 1004 12/09/20 1210  BP: 105/61 (!) 108/55 132/66 (!) 145/59  Pulse: 71 68 80 80  Resp: 11 11 16 18   Temp:   98.6 F (37 C) 98.3 F (36.8 C)  TempSrc:   Oral   SpO2: 97% 96% 99% 98%  Weight:   57.8 kg   Height:   5\' 2"  (1.575 m)     Intake/Output Summary (Last 24 hours) at 12/09/2020 1841 Last data filed at 12/09/2020 1700 Gross per 24 hour  Intake --  Output 2950 ml  Net -2950 ml   Filed Weights   12/08/20 2141 12/09/20 1004  Weight: 63.7 kg 57.8 kg    Physical Exam:  General exam: awake, alert, no acute distress HEENT: moist mucus membranes, hearing grossly normal  Respiratory system: CTAB, no wheezes, rales or rhonchi, normal respiratory effort. Cardiovascular system: normal S1/S2, RRR, no pedal edema.   Gastrointestinal system: soft, NT, ND, no HSM felt, +bowel sounds. Central nervous system: A&O x3. no gross focal neurologic deficits, normal speech Extremities: moves all, no edema, normal tone Skin: dry, intact, normal temperature, normal color, No rashes, lesions or ulcers Psychiatry: normal mood, congruent affect, judgement and insight appear  normal  Labs   Data Reviewed: I have personally reviewed following labs and imaging studies  CBC: Recent Labs  Lab 12/08/20 2144  WBC 6.2  NEUTROABS 4.7  HGB 12.0  HCT 35.8*  MCV 89.9  PLT 284   Basic Metabolic Panel: Recent Labs  Lab 12/08/20 2144  NA 132*  K 4.5  CL 94*  CO2 23  GLUCOSE 200*  BUN 25*  CREATININE 1.30*  CALCIUM 9.4   GFR: Estimated Creatinine Clearance: 30.5 mL/min (A) (by C-G formula based on SCr of 1.3 mg/dL (H)). Liver Function Tests: Recent Labs  Lab 12/08/20 2144  AST 29  ALT 24  ALKPHOS 57  BILITOT  0.6  PROT 7.8  ALBUMIN 4.0   No results for input(s): LIPASE, AMYLASE in the last 168 hours. No results for input(s): AMMONIA in the last 168 hours. Coagulation Profile: No results for input(s): INR, PROTIME in the last 168 hours. Cardiac Enzymes: No results for input(s): CKTOTAL, CKMB, CKMBINDEX, TROPONINI in the last 168 hours. BNP (last 3 results) No results for input(s): PROBNP in the last 8760 hours. HbA1C: No results for input(s): HGBA1C in the last 72 hours. CBG: Recent Labs  Lab 12/09/20 1208 12/09/20 1703  GLUCAP 297* 205*   Lipid Profile: No results for input(s): CHOL, HDL, LDLCALC, TRIG, CHOLHDL, LDLDIRECT in the last 72 hours. Thyroid Function Tests: No results for input(s): TSH, T4TOTAL, FREET4, T3FREE, THYROIDAB in the last 72 hours. Anemia Panel: Recent Labs    12/08/20 2144  FERRITIN 352*   Sepsis Labs: Recent Labs  Lab 12/08/20 2144  PROCALCITON 0.10    Recent Results (from the past 240 hour(s))  Resp Panel by RT-PCR (Flu A&B, Covid) Nasopharyngeal Swab     Status: Abnormal   Collection Time: 12/08/20  9:44 PM   Specimen: Nasopharyngeal Swab; Nasopharyngeal(NP) swabs in vial transport medium  Result Value Ref Range Status   SARS Coronavirus 2 by RT PCR POSITIVE (A) NEGATIVE Final    Comment: RESULT CALLED TO, READ BACK BY AND VERIFIED WITH: NOAH Keano [place] 12/08/20 AT 2310 BY HS (NOTE) SARS-CoV-2  target nucleic acids are DETECTED.  The SARS-CoV-2 RNA is generally detectable in upper respiratory specimens during the acute phase of infection. Positive results are indicative of the presence of the identified virus, but do not rule out bacterial infection or co-infection with other pathogens not detected by the test. Clinical correlation with patient history and other diagnostic information is necessary to determine patient infection status. The expected result is Negative.  Fact Sheet for Patients: EntrepreneurPulse.com.au  Fact Sheet for Healthcare Providers: IncredibleEmployment.be  This test is not yet approved or cleared by the [place] FDA and  has been authorized for detection and/or diagnosis of SARS-CoV-2 by FDA under an Emergency Use Authorization (EUA).  This EUA will remain in effect (meaning this test can  be used) for the duration of  the COVID-19 declaration under Section 564(b)(1) of the Act, 21 U.S.C. section 360bbb-3(b)(1), unless the authorization is terminated or revoked sooner.     Influenza A by PCR NEGATIVE NEGATIVE Final   Influenza B by PCR NEGATIVE NEGATIVE Final    Comment: (NOTE) The Xpert Xpress SARS-CoV-2/FLU/RSV plus assay is intended as an aid in the diagnosis of influenza from Nasopharyngeal swab specimens and should not be used as a sole basis for treatment. Nasal washings and aspirates are unacceptable for Xpert Xpress SARS-CoV-2/FLU/RSV testing.  Fact Sheet for Patients: EntrepreneurPulse.com.au  Fact Sheet for Healthcare Providers: IncredibleEmployment.be  This test is not yet approved or cleared by the [place] FDA and has been authorized for detection and/or diagnosis of SARS-CoV-2 by FDA under an Emergency Use Authorization (EUA). This EUA will remain in effect (meaning this test can be used) for the duration of the COVID-19 declaration under Section  564(b)(1) of the Act, 21 U.S.C. section 360bbb-3(b)(1), unless the authorization is terminated or revoked.  Performed at [facility], [address], [place],  [address]       Imaging Studies   DG Chest 2 View  Result Date: 12/08/2020 CLINICAL DATA:  Weakness, swelling of legs, decreased appetite. EXAM: CHEST - 2 VIEW COMPARISON:  Chest x-ray 01/30/2020, CT  chest 03/09/2016 FINDINGS: The heart size and mediastinal contours are unchanged. Aortic arch calcification. No focal consolidation. No pulmonary edema. No pleural effusion. No pneumothorax. No acute osseous abnormality. Multilevel degenerative changes of the spine. IMPRESSION: No active cardiopulmonary disease. Electronically Signed   By: Iven Finn M.D.   On: 12/08/2020 22:47   CT ANGIO CHEST PE W OR WO CONTRAST  Result Date: 12/09/2020 CLINICAL DATA:  Shortness of breath PE suspected, low/intermediate prob, positive D-dimer COVID-19 Weakness. EXAM: CT ANGIOGRAPHY CHEST WITH CONTRAST TECHNIQUE: Multidetector CT imaging of the chest was performed using the standard protocol during bolus administration of intravenous contrast. Multiplanar CT image reconstructions and MIPs were obtained to evaluate the vascular anatomy. CONTRAST:  61mL OMNIPAQUE IOHEXOL 350 MG/ML SOLN COMPARISON:  Radiograph yesterday, CT 03/09/2016 FINDINGS: Cardiovascular: There are no filling defects within the pulmonary arteries to suggest pulmonary embolus. Atherosclerosis of the thoracic aorta without dissection or acute aortic findings. Upper normal heart size. There are coronary artery calcifications. No pericardial effusion. Mediastinum/Nodes: Mild distal esophageal wall thickening. No enlarged mediastinal or hilar lymph nodes. Prominent right lobe of the thyroid is before, no discrete nodule. Lungs/Pleura: Mild multifocal geographic peripheral predominant ground-glass opacities throughout both lungs typical of COVID-19 pneumonia. No septal  thickening or pulmonary edema. No pleural fluid. No pneumothorax. Trachea and central bronchi are patent. Upper Abdomen: No acute or unexpected findings. Musculoskeletal: There are no acute or suspicious osseous abnormalities. Thoracic spondylosis. Review of the MIP images confirms the above findings. IMPRESSION: 1. No pulmonary embolus. 2. Mild multifocal geographic ground-glass opacities throughout both lungs typical of COVID-19 pneumonia. 3. Mild distal esophageal wall thickening, can be seen with reflux or esophagitis. 4. Coronary artery calcifications. Aortic Atherosclerosis (ICD10-I70.0). Electronically Signed   By: Keith Rake M.D.   On: 12/09/2020 02:51     Medications   Scheduled Meds: . amLODipine  5 mg Oral Daily  . vitamin C  500 mg Oral Daily  . aspirin EC  81 mg Oral Daily  . clopidogrel  75 mg Oral Daily  . enoxaparin (LOVENOX) injection  40 mg Subcutaneous Q24H  . famotidine  20 mg Oral BID  . insulin aspart  0-15 Units Subcutaneous TID WC  . insulin aspart  0-5 Units Subcutaneous QHS  . insulin glargine  20 Units Subcutaneous QHS  . isosorbide mononitrate  60 mg Oral BID  . rosuvastatin  20 mg Oral QHS  . spironolactone  25 mg Oral Daily  . zinc sulfate  220 mg Oral Daily   Continuous Infusions: . sodium chloride 100 mL/hr at 12/09/20 0604  . [START ON 12/10/2020] remdesivir 100 mg in NS 100 mL         LOS: 1 day    Time spent: 30 minutes    Ezekiel Slocumb, DO [facility]  12/09/2020, 6:41 PM    If 7PM-7AM, please contact night-coverage. How to contact the [facility] Attending or Consulting provider [place] or covering provider during after hours [place], for this patient?    1. Check the care team in [facility] and look for a) attending/consulting TR[facility] provider listed and b) the [facility] team listed 2. Log into [email or web address] and use [place] universal password to access. If you do not have the password, please contact the hospital operator. 3. Locate the  [facility] provider you are looking for under [facility] and page to a number that you can be directly reached. 4. If you still have difficulty reaching the provider, please page the  DOC (Director on Call) for the Hospitalists listed on amion for assistance.

## 2020-12-09 NOTE — ED Notes (Signed)
Pt ambulated across room and back. Definitely weaker than baseline. Mildly unsteady, ability at approximately the walker level. Pt was reportedly tired prior to ambulation, and continued to be tired after. Pt spo2 maintained at 96% from start to finish   Pt at baseline ambulates and drives without difficulty, does not require any assistance with ADL's

## 2020-12-09 NOTE — ED Notes (Signed)
Report to ashley, rn

## 2020-12-09 NOTE — Hospital Course (Signed)
75 year old female with past medical history of type 2 diabetes with retinopathy, hypertension, hyperlipidemia, history of CVA, presented to the ED on 12/08/2020 with complaints of progressive generalized weakness and poor appetite for the past 3 days.  No fevers or chills or respiratory complaints.  In the ED, COVID-19 PCR positive.  She had a fever of 101 on arrival, with otherwise stable vitals.  Labs notable for mild hyponatremia, hypochloremia, glucose 200, BUN 25 and creatinine 1.30.  Chest x-ray read as negative.  CTA chest was negative for PE but did show mild multifocal groundglass opacities throughout both lungs typical of COVID-19 pneumonia.  Admitted to hospitalist service and started on remdesivir and supportive care.

## 2020-12-10 DIAGNOSIS — I10 Essential (primary) hypertension: Secondary | ICD-10-CM | POA: Diagnosis not present

## 2020-12-10 DIAGNOSIS — E440 Moderate protein-calorie malnutrition: Secondary | ICD-10-CM | POA: Insufficient documentation

## 2020-12-10 DIAGNOSIS — U071 COVID-19: Secondary | ICD-10-CM | POA: Diagnosis not present

## 2020-12-10 LAB — COMPREHENSIVE METABOLIC PANEL
ALT: 21 U/L (ref 0–44)
AST: 26 U/L (ref 15–41)
Albumin: 3.3 g/dL — ABNORMAL LOW (ref 3.5–5.0)
Alkaline Phosphatase: 47 U/L (ref 38–126)
Anion gap: 10 (ref 5–15)
BUN: 24 mg/dL — ABNORMAL HIGH (ref 8–23)
CO2: 23 mmol/L (ref 22–32)
Calcium: 9.1 mg/dL (ref 8.9–10.3)
Chloride: 105 mmol/L (ref 98–111)
Creatinine, Ser: 0.92 mg/dL (ref 0.44–1.00)
GFR, Estimated: >60 mL/min (ref >60)
Glucose, Bld: 157 mg/dL — ABNORMAL HIGH (ref 70–99)
Potassium: 3.8 mmol/L (ref 3.5–5.1)
Sodium: 138 mmol/L (ref 135–145)
Total Bilirubin: 0.5 mg/dL (ref 0.3–1.2)
Total Protein: 6.6 g/dL (ref 6.5–8.1)

## 2020-12-10 LAB — FERRITIN: Ferritin: 486 ng/mL — ABNORMAL HIGH (ref 11–307)

## 2020-12-10 LAB — HEMOGLOBIN A1C
Hgb A1c MFr Bld: 7 % — ABNORMAL HIGH (ref 4.8–5.6)
Mean Plasma Glucose: 154 mg/dL

## 2020-12-10 LAB — CBC
HCT: 31.2 % — ABNORMAL LOW (ref 36.0–46.0)
Hemoglobin: 10.7 g/dL — ABNORMAL LOW (ref 12.0–15.0)
MCH: 30.4 pg (ref 26.0–34.0)
MCHC: 34.3 g/dL (ref 30.0–36.0)
MCV: 88.6 fL (ref 80.0–100.0)
Platelets: 268 10*3/uL (ref 150–400)
RBC: 3.52 MIL/uL — ABNORMAL LOW (ref 3.87–5.11)
RDW: 12.9 % (ref 11.5–15.5)
WBC: 6.3 10*3/uL (ref 4.0–10.5)
nRBC: 0 % (ref 0.0–0.2)

## 2020-12-10 LAB — GLUCOSE, CAPILLARY
Glucose-Capillary: 120 mg/dL — ABNORMAL HIGH (ref 70–99)
Glucose-Capillary: 140 mg/dL — ABNORMAL HIGH (ref 70–99)
Glucose-Capillary: 160 mg/dL — ABNORMAL HIGH (ref 70–99)
Glucose-Capillary: 75 mg/dL (ref 70–99)

## 2020-12-10 LAB — MAGNESIUM: Magnesium: 2.1 mg/dL (ref 1.7–2.4)

## 2020-12-10 LAB — C-REACTIVE PROTEIN: CRP: 4.2 mg/dL — ABNORMAL HIGH (ref <1.0)

## 2020-12-10 LAB — FIBRIN DERIVATIVES D-DIMER (ARMC ONLY): Fibrin derivatives D-dimer (ARMC): 698.12 ng/mL (FEU) — ABNORMAL HIGH (ref 0.00–499.00)

## 2020-12-10 MED ORDER — ADULT MULTIVITAMIN W/MINERALS CH
1.0000 | ORAL_TABLET | Freq: Every day | ORAL | Status: DC
  Administered 2020-12-11: 1 via ORAL
  Filled 2020-12-10: qty 1

## 2020-12-10 MED ORDER — ENSURE ENLIVE PO LIQD
237.0000 mL | Freq: Three times a day (TID) | ORAL | Status: DC
  Administered 2020-12-10 – 2020-12-11 (×2): 237 mL via ORAL

## 2020-12-10 NOTE — Evaluation (Signed)
Occupational Therapy Evaluation Patient Details Name: Sabrina [name] MRN: [id number] DOB: December 04, 1945 Today's Date: 12/10/2020    History of Present Illness 75 year old female with past medical history of type 2 diabetes with retinopathy, hypertension, hyperlipidemia, history of CVA, presented to the ED on 12/08/2020 with complaints of progressive generalized weakness and poor appetite for the past 3 days.  No fevers or chills or respiratory complaints.   Clinical Impression   Patient presenting with decreased I in self care, balance, functional mobility/transfer, endurance, and safety awareness. Patient reports living with her daughter, who works from home, and performing all self care and functional mobility at mod I level with use of SPC PTA. Pt with posterior bias in standing requiring mod A for safety. Pt progressed to min A with short distance mobility hand held. Pt needing min A for self care tasks this session. Pt reports her daughter is able to provide assistance at home and is agreeable to [facility] intervention at discharge. Patient will benefit from acute OT to increase overall independence in the areas of ADLs, functional mobility, and safety awareness in order to safely discharge to next venue of care.    Follow Up Recommendations  Home health OT;Supervision/Assistance - 24 hour    Equipment Recommendations  Other (comment) (RW)       Precautions / Restrictions Precautions Precautions: Fall      Mobility Bed Mobility               General bed mobility comments: sitting on EOB when entering the room    Transfers Overall transfer level: Needs assistance Equipment used: 1 person hand held assist Transfers: Sit to/from [employer] Transfers Sit to Stand: Mod assist Stand pivot transfers: Min assist;Mod assist       General transfer comment: mod lifting assistance with posterior bias noted in standing    Balance Overall balance assessment: Needs  assistance Sitting-balance support: Feet supported Sitting balance-Leahy Scale: Good Sitting balance - Comments: no LOB   Standing balance support: During functional activity Standing balance-Leahy Scale: Poor                             ADL either performed or assessed with clinical judgement   ADL Overall ADL's : Needs assistance/impaired     Grooming: Wash/dry hands;Wash/dry face;Oral care;Supervision/safety;Set up;Sitting               Lower Body Dressing: Minimal assistance;Sitting/lateral leans   Toilet Transfer: Minimal assistance;Moderate assistance Toilet Transfer Details (indicate cue type and reason): simulated         Functional mobility during ADLs: Moderate assistance;Minimal assistance;Cueing for safety General ADL Comments: mod A progressing to min A with posterior bias noted. HHA.     Vision Patient Visual Report: No change from baseline              Pertinent Vitals/Pain Pain Assessment: No/denies pain     Hand Dominance Right   Extremity/Trunk Assessment Upper Extremity Assessment Upper Extremity Assessment: Generalized weakness   Lower Extremity Assessment Lower Extremity Assessment: Generalized weakness   Cervical / Trunk Assessment Cervical / Trunk Assessment: Normal   Communication Communication Communication: No difficulties   Cognition Arousal/Alertness: Awake/alert Behavior During Therapy: WFL for tasks assessed/performed Overall Cognitive Status: Within Functional Limits for tasks assessed  General Comments: Pt is very verbose during session. Pt following commands and is A & O x4.              Home Living Family/patient expects to be discharged to:: Private residence Living Arrangements: Children Available Help at Discharge: Available 24 hours/day;Family Type of Home: House Home Access: Stairs to enter [employer] of Steps: 2 Entrance  Stairs-Rails: None Home Layout: Two level;Able to live on main level with bedroom/bathroom     Bathroom Shower/Tub: Occupational psychologist: Standard     Home Equipment: Cane - single point   Additional Comments: Pt reports she would like to install grab bars in shower      Prior Functioning/Environment Level of Independence: Independent with assistive device(s)        Comments: Pt uses SPC at home and reports being mod I for self care tasks and functional mobility. She does not shower unless family is nearby        OT Problem List: Decreased strength;Impaired balance (sitting and/or standing);Decreased activity tolerance;Decreased knowledge of use of DME or AE;Decreased safety awareness      OT Treatment/Interventions: Self-care/ADL training;Therapeutic exercise;Energy conservation;DME and/or AE instruction;Therapeutic activities;Balance training;Patient/family education;Manual therapy;Modalities    OT Goals(Current goals can be found in the care plan section) Acute Rehab OT Goals Patient Stated Goal: to get better OT Goal Formulation: With patient Time For Goal Achievement: 12/24/20 Potential to Achieve Goals: Fair  OT Frequency: Min 2X/week   Barriers to D/C: Other (comment)  none at this time          AM-PAC OT "6 Clicks" Daily Activity     Outcome Measure Help from another person eating meals?: A Little Help from another person taking care of personal grooming?: A Little Help from another person toileting, which includes using toliet, bedpan, or urinal?: A Lot Help from another person bathing (including washing, rinsing, drying)?: A Lot Help from another person to put on and taking off regular upper body clothing?: A Little Help from another person to put on and taking off regular lower body clothing?: A Lot 6 Click Score: 15   End of Session Nurse Communication: Mobility status  Activity Tolerance: Patient tolerated treatment well Patient left: in  bed;with bed alarm set;with call bell/phone within reach  OT Visit Diagnosis: Unsteadiness on feet (R26.81);Muscle weakness (generalized) (M62.81)                Time: 1010-1040 OT Time Calculation (min): 30 min Charges:  OT General Charges $OT Visit: 1 Visit OT Evaluation $OT Eval Low Complexity: 1 Low OT Treatments $Self Care/Home Management : 8-22 mins $Therapeutic Activity: 8-22 mins  Darleen Crocker, MS, OTR/L , CBIS ascom [phone]  12/10/20, 1:20 PM

## 2020-12-10 NOTE — Evaluation (Signed)
Physical Therapy Evaluation Patient Details Name: Sabrina [name] MRN: [id number] DOB: November 30, 1945 Today's Date: 12/10/2020   History of Present Illness  Sabrina [name] is a 79yoF who comes to [facility] on 2/13 c  weakness, LEE, anorexia.   PMH: HTN, HLD, DM, CVA. Pt (+) c COVID19.  Clinical Impression  Pt admitted with above diagnosis. Pt currently with functional limitations due to the deficits listed below (see "PT Problem List"). Upon entry, pt in bed, awake and agreeable to participate. The pt is alert, pleasant, interactive, and able to provide info regarding prior level of function, both in tolerance and independence. Pt requires minA for all mobility today, and needs constant physical assistance for recurrent LOB in session; I do not anticipate a RW will resolve this problem as pt has delayed onset proprioception and does not realize she is falling until window for self-correct has come to pass. Pt should have 1:1 assist with all AMB activity at DC. Pt also severely limited in activity tolerance, fatigued after AMB only 83ft.   Patient's performance this date reveals decreased ability, independence, and tolerance in performing all basic mobility required for performance of activities of daily living. Pt requires additional DME, close physical assistance, and cues for safe participate in mobility. Pt will benefit from skilled PT intervention to increase independence and safety with basic mobility in preparation for discharge to the venue listed below.       Follow Up Recommendations Home health PT;Supervision for mobility/OOB    Equipment Recommendations  None recommended by PT (should use her walker at home until back to baseline.)    Recommendations for Other Services       Precautions / Restrictions Precautions Precautions: Fall Restrictions Weight Bearing Restrictions: No      Mobility  Bed Mobility Overal bed mobility: Needs Assistance Bed Mobility: Supine to Sit;Sit to Supine      Supine to sit: Min assist Sit to supine: Min assist   General bed mobility comments: acutely weak    Transfers Overall transfer level: Needs assistance Equipment used: 1 person hand held assist Transfers: Sit to/from Stand Sit to Stand: Min assist         General transfer comment: significant impaired sagittal plane posture control, several LOB posteriorly.  Ambulation/Gait Ambulation/Gait assistance: Min assist Gait Distance (Feet): 24 Feet Assistive device: 1 person hand held assist       General Gait Details: slow, staggering, multiple LOB backwards  Stairs            Wheelchair Mobility    Modified Rankin (Stroke Patients Only)       Balance Overall balance assessment: Needs assistance Sitting-balance support: Single extremity supported Sitting balance-Leahy Scale: Good     Standing balance support: Single extremity supported;During functional activity Standing balance-Leahy Scale: Poor                               Pertinent Vitals/Pain Pain Assessment: No/denies pain    Home Living Family/patient expects to be discharged to:: Private residence Living Arrangements: Children Available Help at Discharge: Available 24 hours/day;Family (DTR workds from home; 2 DTRS adn 2 grandchildren) Type of Home: House Home Access: Stairs to enter Entrance Stairs-Rails: None (1 hand on cane, 1 on wall) Entrance Stairs-Number of Steps: 2 Home Layout: Two level;Able to live on main level with bedroom/bathroom Home Equipment: Kasandra [name] - single point;Walker - 2 wheels;Walker - 4 wheels (4ww currenly broken) Additional Comments: Pt reports  she would like to install grab bars in shower    Prior Function Level of Independence: Independent with assistive device(s)         Comments: Pt uses SPC at home and reports being mod I for self care tasks and functional mobility. She does not shower unless family is nearby     Hand Dominance   Dominant Hand:  Right    Extremity/Trunk Assessment   Upper Extremity Assessment Upper Extremity Assessment: Overall WFL for tasks assessed    Lower Extremity Assessment Lower Extremity Assessment: Generalized weakness       Communication   Communication: No difficulties  Cognition Arousal/Alertness: Awake/alert Behavior During Therapy: WFL for tasks assessed/performed Overall Cognitive Status: Within Functional Limits for tasks assessed                                        General Comments      Exercises     Assessment/Plan    PT Assessment Patient needs continued PT services  PT Problem List Decreased strength;Decreased activity tolerance;Decreased mobility       PT Treatment Interventions DME instruction;Balance training;Gait training;Stair training;Functional mobility training;Therapeutic activities;Therapeutic exercise;Patient/family education    PT Goals (Current goals can be found in the Care Plan section)  Acute Rehab PT Goals Patient Stated Goal: to get better PT Goal Formulation: With patient Time For Goal Achievement: 12/24/20 Potential to Achieve Goals: Good    Frequency Min 2X/week   Barriers to discharge        Co-evaluation               AM-PAC PT "6 Clicks" Mobility  Outcome Measure Help needed turning from your back to your side while in a flat bed without using bedrails?: A Lot Help needed moving from lying on your back to sitting on the side of a flat bed without using bedrails?: A Lot Help needed moving to and from a bed to a chair (including a wheelchair)?: A Lot Help needed standing up from a chair using your arms (e.g., wheelchair or bedside chair)?: A Lot Help needed to walk in hospital room?: A Lot Help needed climbing 3-5 steps with a railing? : A Lot 6 Click Score: 12    End of Session Equipment Utilized During Treatment: Gait belt Activity Tolerance: Patient tolerated treatment well;Patient limited by fatigue Patient  left: in bed;with call bell/phone within reach;with bed alarm set Nurse Communication: Mobility status PT Visit Diagnosis: Difficulty in walking, not elsewhere classified (R26.2);Unsteadiness on feet (R26.81);Other abnormalities of gait and mobility (R26.89);Muscle weakness (generalized) (M62.81)    Time: 2633-3545 PT Time Calculation (min) (ACUTE ONLY): 21 min   Charges:   PT Evaluation $PT Eval Moderate Complexity: 1 Mod          4:14 PM, 12/10/20 Etta Grandchild, PT, DPT Physical Therapist - [facility]  [phone] ([place])    Kamas C 12/10/2020, 4:11 PM

## 2020-12-10 NOTE — Progress Notes (Addendum)
Initial Nutrition Assessment  DOCUMENTATION CODES:   Non-severe (moderate) malnutrition in context of social or environmental circumstances  INTERVENTION:   -MVI with minerals, daily  -Ensure Enlive TID, each supplement provides 350 kcal, 20 grams protein   -Magic Cup TID, each supplement provides 290 kcal, 9 grams of protein   -encourage po intake  -recommend changing diet to carbohydrate modified  NUTRITION DIAGNOSIS:   Moderate Malnutrition related to social / environmental circumstances (suspected inadequate oral intake) as evidenced by moderate muscle depletion,mild fat depletion.  GOAL:   Patient will meet greater than or equal to 90% of their needs  MONITOR:   PO intake,Skin,I & O's,Weight trends,Supplement acceptance,Labs  REASON FOR ASSESSMENT:   Malnutrition Screening Tool    ASSESSMENT:   89 YOF presented to ED with generalized weakness and admitted for COVID-19 infection. Pt currently has AKI and hx of HTN, DM w/o complication, HLD, stroke.  Pt mentioned that prior to getting sick she was eating well at home. She mentioned that she typically eats 3 meals a day and snacks between meals. Pt mentioned that she enjoys eating fruits, vegetables, soup and salad. Pt told intern that she likes to have chicken noodle soup and a salad for her lunch and will sometimes have chicken noodle soup for her dinner as well. Pt also mentioned that for dinner she likes to have oodles and noodles with vegetables added in. Intern expects that pt is not eating enough to meet her baseline needs or her increased needs due to acute illness (COVID-19).    Pt mentioned that she has had a poor appetite since she has been sick with COVID-19. She mentioned that she has not been able to eat much during her hospital stay due to her decreased appetite.   Pt told intern she was not taking any ONS PTA, however she was familiar with Ensure and Boost supplements. Intern discussed with pt the  importance of protein and calories to aid in her healing process. Intern mentioned to pt the option of incorporating Ensure Enlive supplements as well as Magic Cup supplements into her diet during her hospital stay. Pt was willing to try supplements and understood their importance.   Pt denied any abdominal pain, constipation, diarrhea, nausea or vomiting. Pt also denied any chewing and/or swallowing difficulties.   Pt's weights reviewed and note a 21% weight loss in 10 months which is severe and significant. Unsure of accuracy, however will continue to monitor,   Meds Reviewed: Vitamin C (500 mg, daily), Pepcid (20 mg, BID), Aldactone (25 mg, daily), Zinc Sulfate (220 mg, daily)   Labs Reviewed: Glucose (157 mg/dL), BUN (24 mg/dL), Ferritin (486 ng/mL)  NUTRITION - FOCUSED PHYSICAL EXAM:  Flowsheet Row Most Recent Value  Orbital Region Mild depletion  Upper Arm Region Mild depletion  Thoracic and Lumbar Region Mild depletion  Buccal Region Mild depletion  Temple Region Mild depletion  Clavicle Bone Region Moderate depletion  Clavicle and Acromion Bone Region Moderate depletion  Scapular Bone Region Mild depletion  Dorsal Hand Moderate depletion  Patellar Region Moderate depletion  Anterior Thigh Region Moderate depletion  Posterior Calf Region Mild depletion  Edema (RD Assessment) None  Hair Reviewed  Eyes Reviewed  Mouth Reviewed  Skin Reviewed  Nails Reviewed       Diet Order:   Diet Order            Diet Heart Room service appropriate? Yes; Fluid consistency: Thin  Diet effective now  EDUCATION NEEDS:   No education needs have been identified at this time  Skin:  Skin Assessment: Reviewed RN Assessment  Last BM:  unknown.  Height:   Ht Readings from Last 1 Encounters:  12/09/20 5\' 2"  (1.575 m)    Weight:   Wt Readings from Last 1 Encounters:  12/09/20 57.8 kg    Ideal Body Weight:  52.3 kg  BMI:  Body mass index is 23.32  kg/m.  Estimated Nutritional Needs:   Kcal:  1600-1800  Protein:  75-90 grams  Fluid:  >/=1.6 L    Salvadore Oxford, Dietetic Intern 12/10/2020 2:54 PM

## 2020-12-10 NOTE — Progress Notes (Addendum)
PROGRESS NOTE    Sabrina [name]   ZOX:[id number]  DOB: 06/11/1946  PCP: [name], MD    DOA: 12/08/2020 LOS: 2   Brief Narrative / Hospital Course to-date   75 year old female with past medical history of type 2 diabetes with retinopathy, hypertension, hyperlipidemia, history of CVA, presented to the ED on 12/08/2020 with complaints of progressive generalized weakness and poor appetite for the past 3 days.  No fevers or chills or respiratory complaints.  In the ED, COVID-19 PCR positive.  She had a fever of 101 on arrival, with otherwise stable vitals.  Labs notable for mild hyponatremia, hypochloremia, glucose 200, BUN 25 and creatinine 1.30.  Chest x-ray read as negative.  CTA chest was negative for PE but did show mild multifocal groundglass opacities throughout both lungs typical of COVID-19 pneumonia.  Admitted to hospitalist service and started on remdesivir and supportive care.    Significant Events: -admitted 12/08/2020  Date of +Covid Test: 12/08/2020  Vaccination status: Appears is unvaccinated  Oxygen requirements: None  Covid specific treatments: Remdesivir 2/13 >>    Assessment & Plan   Principal Problem:   COVID-19 virus infection Active Problems:   AKI (acute kidney injury) ([place])   Essential hypertension   Type 2 diabetes mellitus (HCC)   Dyslipidemia   Acute CVA (cerebrovascular accident) ([place])   Malnutrition of moderate degree   COVID-19 infection -with symptoms of fevers, generalized weakness and poor appetite.  No respiratory symptoms reported but does have mild groundglass opacities on imaging.  She is not hypoxic however. --Stop steroids given no hypoxia, will resume if she develops this --Continue remdesivir (3 days should be sufficient given very mild illness and she is clinically improving) --Continue supportive care with vitamins, zinc, aspirin --Follow inflammatory markers, CBC, CMP  Acute kidney injury -present on admission, resolved.    Due to prerenal azotemia in the setting of dehydration and poor p.o. intake.   Resolved with IV fluids.   --Monitor BMP  Hyperlipidemia -continue statin  Type 2 diabetes -continued on basal coverage in addition to sliding scale NovoLog  Essential hypertension -patient's benazepril is held for AKI.  Continued on home Norvasc.  History of CVA -some residual mild dysarthria.  Continue Plavix and aspirin.  Generalized weakness -PT OT eval's.  TOC consulted, expect home health at discharge.  Malnutrition of moderate severity -dietitian following and has ordered supplements.   DVT prophylaxis: enoxaparin (LOVENOX) injection 40 mg Start: 12/09/20 1000   Diet:  Diet Orders (From admission, onward)    Start     Ordered   12/09/20 0133  Diet Heart Room service appropriate? Yes; Fluid consistency: Thin  Diet effective now       Question Answer Comment  Room service appropriate? Yes   Fluid consistency: Thin      12/09/20 0134            Code Status: Full Code    Subjective 12/10/20    Patient seated edge of bed when seen today.  She reports she is staying here in the hospital for all 5 doses of remdesivir.  She talks for about 15 minutes about how vaccines are killing people, and other common conspiracies about the pandemic.    She says she feels better today.  Tolerating diet with improved appetite.  No fevers, chills, shortness of breath, nausea or vomiting or diarrhea, chest pain, palpitations, dysuria or any other specific complaints.    I reminded her we had discussed 3 days remdesivir  since her illness is mild and if tolerating adequate oral intake does not need to remain in the hospital.  Yesterday she was agreeable to this plan, stating she wanted to go home as soon as possible.  Today she says repeatedly that she is staying right where she is until she receives all 5 doses.   Disposition Plan & Communication   Status is: Inpatient  Remains inpatient appropriate  because:IV treatments appropriate due to intensity of illness or inability to take PO   Dispo: The patient is from: Home              Anticipated d/c is to: Home              Anticipated d/c date is: 1 to 2 days              Patient currently is not medically stable to d/c.   Difficult to place patient No   Family Communication: None at bedside, will attempt to call this afternoon   Consults, Procedures, Treatments   Consultants:   None  Procedures:   None  Covid-specific treatments:  Antimicrobials:  Anti-infectives (From admission, onward)   Start     Dose/Rate Route Frequency Ordered Stop   12/10/20 1000  remdesivir 100 mg in sodium chloride 0.9 % 100 mL IVPB       "Followed by" Linked Group Details   100 mg 200 mL/hr over 30 Minutes Intravenous Daily 12/08/20 2346 12/14/20 0959   12/10/20 1000  remdesivir 100 mg in sodium chloride 0.9 % 100 mL IVPB  Status:  Discontinued       "Followed by" Linked Group Details   100 mg 200 mL/hr over 30 Minutes Intravenous Daily 12/09/20 0134 12/09/20 0139   12/09/20 0145  remdesivir 200 mg in sodium chloride 0.9% 250 mL IVPB  Status:  Discontinued       "Followed by" Linked Group Details   200 mg 580 mL/hr over 30 Minutes Intravenous Once 12/09/20 0134 12/09/20 0139   12/09/20 0100  remdesivir 200 mg in sodium chloride 0.9% 250 mL IVPB       "Followed by" Linked Group Details   200 mg 580 mL/hr over 30 Minutes Intravenous Once 12/08/20 2346 12/09/20 0503        Objective   Vitals:   12/10/20 0051 12/10/20 0530 12/10/20 0849 12/10/20 1158  BP: (!) 122/56 114/64 129/85 (!) 131/91  Pulse: 72 64 62 79  Resp: 18 18 16 16   Temp: 97.9 F (36.6 C) 97.8 F (36.6 C) 98.1 F (36.7 C) 98.2 F (36.8 C)  TempSrc: Oral Oral    SpO2: 98% 99% 99% 99%  Weight:      Height:        Intake/Output Summary (Last 24 hours) at 12/10/2020 1452 Last data filed at 12/10/2020 1113 Gross per 24 hour  Intake --  Output 1350 ml  Net -1350  ml   Filed Weights   12/08/20 2141 12/09/20 1004  Weight: 63.7 kg 57.8 kg    Physical Exam:  General exam: awake, alert, no acute distress, sitting edge of bed Respiratory system: CTAB, no wheezes, rales or rhonchi, normal respiratory effort. Cardiovascular system: Regular rate and rhythm, no murmur heard Gastrointestinal system: Soft, nontender. Central nervous system: A&O x4, CN II through XII grossly intact, normal speech oss focal neurologic deficits, mildly dysarthric speech stable Extremities: No peripheral edema, moves all, normal tone, no cyanosis   Labs   Data Reviewed: I have personally reviewed  following labs and imaging studies  CBC: Recent Labs  Lab 12/08/20 2144 12/10/20 0326  WBC 6.2 6.3  NEUTROABS 4.7  --   HGB 12.0 10.7*  HCT 35.8* 31.2*  MCV 89.9 88.6  PLT 241 597   Basic Metabolic Panel: Recent Labs  Lab 12/08/20 2144 12/10/20 0326  NA 132* 138  K 4.5 3.8  CL 94* 105  CO2 23 23  GLUCOSE 200* 157*  BUN 25* 24*  CREATININE 1.30* 0.92  CALCIUM 9.4 9.1  MG  --  2.1   GFR: Estimated Creatinine Clearance: 43.1 mL/min (by C-G formula based on SCr of 0.92 mg/dL). Liver Function Tests: Recent Labs  Lab 12/08/20 2144 12/10/20 0326  AST 29 26  ALT 24 21  ALKPHOS 57 47  BILITOT 0.6 0.5  PROT 7.8 6.6  ALBUMIN 4.0 3.3*   No results for input(s): LIPASE, AMYLASE in the last 168 hours. No results for input(s): AMMONIA in the last 168 hours. Coagulation Profile: No results for input(s): INR, PROTIME in the last 168 hours. Cardiac Enzymes: No results for input(s): CKTOTAL, CKMB, CKMBINDEX, TROPONINI in the last 168 hours. BNP (last 3 results) No results for input(s): PROBNP in the last 8760 hours. HbA1C: Recent Labs    12/09/20 1104  HGBA1C 7.0*   CBG: Recent Labs  Lab 12/09/20 1208 12/09/20 1703 12/09/20 2154 12/10/20 0850 12/10/20 1159  GLUCAP 297* 205* 159* 120* 140*   Lipid Profile: No results for input(s): CHOL, HDL, LDLCALC,  TRIG, CHOLHDL, LDLDIRECT in the last 72 hours. Thyroid Function Tests: No results for input(s): TSH, T4TOTAL, FREET4, T3FREE, THYROIDAB in the last 72 hours. Anemia Panel: Recent Labs    12/08/20 2144 12/10/20 0326  FERRITIN 352* 486*   Sepsis Labs: Recent Labs  Lab 12/08/20 2144  PROCALCITON 0.10    Recent Results (from the past 240 hour(s))  Resp Panel by RT-PCR (Flu A&B, Covid) Nasopharyngeal Swab     Status: Abnormal   Collection Time: 12/08/20  9:44 PM   Specimen: Nasopharyngeal Swab; Nasopharyngeal(NP) swabs in vial transport medium  Result Value Ref Range Status   SARS Coronavirus 2 by RT PCR POSITIVE (A) NEGATIVE Final    Comment: RESULT CALLED TO, READ BACK BY AND VERIFIED WITH: NOAH Shey Yott 12/08/20 AT 2310 BY HS (NOTE) SARS-CoV-2 target nucleic acids are DETECTED.  The SARS-CoV-2 RNA is generally detectable in upper respiratory specimens during the acute phase of infection. Positive results are indicative of the presence of the identified virus, but do not rule out bacterial infection or co-infection with other pathogens not detected by the test. Clinical correlation with patient history and other diagnostic information is necessary to determine patient infection status. The expected result is Negative.  Fact Sheet for Patients: EntrepreneurPulse.com.au  Fact Sheet for Healthcare Providers: IncredibleEmployment.be  This test is not yet approved or cleared by the [place] FDA and  has been authorized for detection and/or diagnosis of SARS-CoV-2 by FDA under an Emergency Use Authorization (EUA).  This EUA will remain in effect (meaning this test can  be used) for the duration of  the COVID-19 declaration under Section 564(b)(1) of the Act, 21 U.S.C. section 360bbb-3(b)(1), unless the authorization is terminated or revoked sooner.     Influenza A by PCR NEGATIVE NEGATIVE Final   Influenza B by PCR NEGATIVE NEGATIVE  Final    Comment: (NOTE) The Xpert Xpress SARS-CoV-2/FLU/RSV plus assay is intended as an aid in the diagnosis of influenza from Nasopharyngeal swab specimens and should not  be used as a sole basis for treatment. Nasal washings and aspirates are unacceptable for Xpert Xpress SARS-CoV-2/FLU/RSV testing.  Fact Sheet for Patients: EntrepreneurPulse.com.au  Fact Sheet for Healthcare Providers: IncredibleEmployment.be  This test is not yet approved or cleared by the [place] FDA and has been authorized for detection and/or diagnosis of SARS-CoV-2 by FDA under an Emergency Use Authorization (EUA). This EUA will remain in effect (meaning this test can be used) for the duration of the COVID-19 declaration under Section 564(b)(1) of the Act, 21 U.S.C. section 360bbb-3(b)(1), unless the authorization is terminated or revoked.  Performed at [facility], [address] [place], [place] [address]       Imaging Studies   DG Chest 2 View  Result Date: 12/08/2020 CLINICAL DATA:  Weakness, swelling of legs, decreased appetite. EXAM: CHEST - 2 VIEW COMPARISON:  Chest x-ray 01/30/2020, CT chest 03/09/2016 FINDINGS: The heart size and mediastinal contours are unchanged. Aortic arch calcification. No focal consolidation. No pulmonary edema. No pleural effusion. No pneumothorax. No acute osseous abnormality. Multilevel degenerative changes of the spine. IMPRESSION: No active cardiopulmonary disease. Electronically Signed   By: Iven Finn M.D.   On: 12/08/2020 22:47   CT ANGIO CHEST PE W OR WO CONTRAST  Result Date: 12/09/2020 CLINICAL DATA:  Shortness of breath PE suspected, low/intermediate prob, positive D-dimer COVID-19 Weakness. EXAM: CT ANGIOGRAPHY CHEST WITH CONTRAST TECHNIQUE: Multidetector CT imaging of the chest was performed using the standard protocol during bolus administration of intravenous contrast. Multiplanar CT image  reconstructions and MIPs were obtained to evaluate the vascular anatomy. CONTRAST:  76mL OMNIPAQUE IOHEXOL 350 MG/ML SOLN COMPARISON:  Radiograph yesterday, CT 03/09/2016 FINDINGS: Cardiovascular: There are no filling defects within the pulmonary arteries to suggest pulmonary embolus. Atherosclerosis of the thoracic aorta without dissection or acute aortic findings. Upper normal heart size. There are coronary artery calcifications. No pericardial effusion. Mediastinum/Nodes: Mild distal esophageal wall thickening. No enlarged mediastinal or hilar lymph nodes. Prominent right lobe of the thyroid is before, no discrete nodule. Lungs/Pleura: Mild multifocal geographic peripheral predominant ground-glass opacities throughout both lungs typical of COVID-19 pneumonia. No septal thickening or pulmonary edema. No pleural fluid. No pneumothorax. Trachea and central bronchi are patent. Upper Abdomen: No acute or unexpected findings. Musculoskeletal: There are no acute or suspicious osseous abnormalities. Thoracic spondylosis. Review of the MIP images confirms the above findings. IMPRESSION: 1. No pulmonary embolus. 2. Mild multifocal geographic ground-glass opacities throughout both lungs typical of COVID-19 pneumonia. 3. Mild distal esophageal wall thickening, can be seen with reflux or esophagitis. 4. Coronary artery calcifications. Aortic Atherosclerosis (ICD10-I70.0). Electronically Signed   By: Keith Rake M.D.   On: 12/09/2020 02:51     Medications   Scheduled Meds: . amLODipine  5 mg Oral Daily  . vitamin C  500 mg Oral Daily  . aspirin EC  81 mg Oral Daily  . clopidogrel  75 mg Oral Daily  . enoxaparin (LOVENOX) injection  40 mg Subcutaneous Q24H  . famotidine  20 mg Oral BID  . insulin aspart  0-15 Units Subcutaneous TID WC  . insulin aspart  0-5 Units Subcutaneous QHS  . insulin glargine  20 Units Subcutaneous QHS  . isosorbide mononitrate  60 mg Oral BID  . rosuvastatin  20 mg Oral QHS  .  spironolactone  25 mg Oral Daily  . zinc sulfate  220 mg Oral Daily   Continuous Infusions: . remdesivir 100 mg in NS 100 mL 100 mg (12/10/20 1009)  LOS: 2 days    Time spent: 30 minutes    Ezekiel Slocumb, DO [facility]  12/10/2020, 2:52 PM    If 7PM-7AM, please contact night-coverage. How to contact the [facility] Attending or Consulting provider [place] or covering provider during after hours [place], for this patient?    1. Check the care team in [facility] and look for a) attending/consulting [facility] provider listed and b) the [facility] team listed 2. Log into [email or web address] and use [place]'s universal password to access. If you do not have the password, please contact the hospital operator. 3. Locate the [facility] provider you are looking for under [facility] and page to a number that you can be directly reached. 4. If you still have difficulty reaching the provider, please page the [facility] (Director on Call) for the Hospitalists listed on amion for assistance.

## 2020-12-11 DIAGNOSIS — U071 COVID-19: Secondary | ICD-10-CM | POA: Diagnosis not present

## 2020-12-11 LAB — GLUCOSE, CAPILLARY
Glucose-Capillary: 87 mg/dL (ref 70–99)
Glucose-Capillary: 160 mg/dL — ABNORMAL HIGH (ref 70–99)
Glucose-Capillary: 119 mg/dL — ABNORMAL HIGH (ref 70–99)
Glucose-Capillary: 147 mg/dL — ABNORMAL HIGH (ref 70–99)

## 2020-12-11 LAB — MAGNESIUM: Magnesium: 2.1 mg/dL (ref 1.7–2.4)

## 2020-12-11 LAB — D-DIMER, QUANTITATIVE: D-Dimer, Quant: 0.45 ug/mL-FEU (ref 0.00–0.50)

## 2020-12-11 LAB — C-REACTIVE PROTEIN: CRP: 1.8 mg/dL — ABNORMAL HIGH (ref <1.0)

## 2020-12-11 LAB — FERRITIN: Ferritin: 425 ng/mL — ABNORMAL HIGH (ref 11–307)

## 2020-12-11 MED ORDER — FAMOTIDINE 20 MG PO TABS
20.0000 mg | ORAL_TABLET | Freq: Every day | ORAL | Status: DC
  Administered 2020-12-11: 20 mg via ORAL

## 2020-12-11 NOTE — TOC Initial Note (Signed)
Transition of Care [facility]) - Initial/Assessment Note    Patient Details  Name: Sabrina [name] MRN: [id number] Date of Birth: 28-Apr-1946  Transition of Care [facility]) CM/SW Contact:    Magnus Ivan, LCSW Phone Number: 12/11/2020, 10:19 AM  Clinical Narrative:           CSW spoke with patient via phone due to isolation status. Patient lives with her daughter who provides transportation to appointments. PCP is Dr. Doy Hutching. Pharmacy is [facility]. Patient has a cane and RW. Patient had [facility] in the past. No SNF history. Explained recommendations for Home Health, patient agreeable to [place] at DC. Referral made to [facility] with [facility].        Expected Discharge Plan: [place] Barriers to Discharge: Barriers Resolved   Patient Goals and CMS Choice Patient states their goals for this hospitalization and ongoing recovery are:: home with home health CMS Medicare.gov Compare Post Acute Care list provided to:: Patient Choice offered to / list presented to : Patient  Expected Discharge Plan and Services Expected Discharge Plan: [place]: PT,OT [facility] Agency: [facility] [place]) Date [place]: 12/11/20   Representative spoke with at [place]: Drue Novel  Prior Living Arrangements/Services   Lives with:: Adult Children Patient language and need for interpreter reviewed:: Yes Do you feel safe going back to the place where you live?: Yes      Need for Family Participation in Patient Care: Yes (Comment) Care giver support system in place?: Yes (comment) Current home services: DME Criminal Activity/Legal Involvement Pertinent to Current Situation/Hospitalization: No - Comment as needed  Activities of Daily Living Home Assistive Devices/Equipment: Cane (specify quad or straight),Walker (specify type) ADL Screening (condition at time of  admission) Patient's cognitive ability adequate to safely complete daily activities?: Yes Is the patient deaf or have difficulty hearing?: No Does the patient have difficulty seeing, even when wearing glasses/contacts?: No Does the patient have difficulty concentrating, remembering, or making decisions?: Yes Patient able to express need for assistance with ADLs?: Yes Does the patient have difficulty dressing or bathing?: Yes Independently performs ADLs?: No Communication: Independent Dressing (OT): Independent with device (comment) Grooming: Independent with device (comment) Feeding: Independent Bathing: Independent with device (comment) Toileting: Independent with device (comment) In/Out Bed: Independent with device (comment) Walks in Home: Independent with device (comment) Does the patient have difficulty walking or climbing stairs?: Yes Weakness of Legs: Both Weakness of Arms/Hands: Both  Permission Sought/Granted Permission sought to share information with : Chartered certified accountant granted to share information with : Yes, Verbal Permission Granted     Permission granted to share info w AGENCY: Home Health        Emotional Assessment       Orientation: : Oriented to Self,Oriented to Place,Oriented to  Time,Oriented to Situation Alcohol / Substance Use: Not Applicable Psych Involvement: No (comment)  Admission diagnosis:  Generalized weakness [R53.1] AKI (acute kidney injury) ([place]) [N17.9] Fever, unspecified fever cause [R50.9] COVID-19 [U07.1] Patient Active Problem List   Diagnosis Date Noted  . Malnutrition of moderate degree 12/10/2020  . COVID-19 virus infection 12/09/2020  . AKI (acute kidney injury) ([place]) 12/08/2020  . Acute CVA (cerebrovascular accident) ([place]) 01/31/2020  .  Episode of transient neurologic symptoms 01/30/2020  . Hyperglycemia due to type 2 diabetes mellitus ([place]) 01/30/2020  . Incomplete uterine prolapse 12/08/2017  .  Cystocele, midline 12/08/2017  . Bipolar affective disorder, manic, severe, with psychotic behavior ([place])   . Type 2 diabetes mellitus ([place]) 04/01/2016  . Dyslipidemia 04/01/2016  . Brief psychotic disorder ([place]) 03/28/2016  . Noncompliance 03/28/2016  . Essential hypertension 03/09/2016   PCP:  [name], MD Pharmacy:   [facility] [place], [place] - [address] AT [facility] [place] [place] [address] Phone: [phone] Fax: [phone]     Social Determinants of Health (SDOH) Interventions    Readmission Risk Interventions No flowsheet data found.

## 2020-12-11 NOTE — Discharge Summary (Signed)
Physician Discharge Summary  Sabrina [name] EXB:[id number] DOB: 1946-06-05 DOA: 12/08/2020  PCP: [name], MD  Admit date: 12/08/2020 Discharge date: 12/11/2020  Admitted From: Home Disposition: Home  Recommendations for Outpatient Follow-up:  1. Follow up with PCP in 1-2 weeks 2. Please obtain BMP/CBC in one week your next doctors visit.  3. Remain in quarantine for minimum of 10 days as long as remains asymptomatic.    Discharge Condition: Stable CODE STATUS: Full code Diet recommendation: Regular  Brief/Interim Summary: 75 year old with history of DM2, essential hypertension, hyperlipidemia, history of CVA admitted for generalized weakness and poor appetite for the past 3 days prior to admission. She was tested positive for COVID-19. Her CTA chest and chest x-ray were negative. Patient was started on remdesivir and completed 3-day course. She remained on room air at the time of discharge without any complaints. PT recommended home health. Patient is stable for discharge today with outpatient follow-up as recommended above. She was initially tested positive for COVID-19 on 12/08/2020. Advised to remain in quarantine for minimum of 10 days as long as she remains asymptomatic going forward.   COVID-19 infection -remains asymptomatic at this time. Chest x-ray and CTA is normal. Currently remains on room air. Completed 3-day course of remdesivir. No need for steroids. Stable for discharge. BNP and procalcitonin are normal  Acute kidney injury -resolved with IV fluids  Hyperlipidemia -continue statin  Type 2 diabetes with retinopathy -resume home regimen  Essential hypertension -resume home medications  History of CVA -has some residual dysarthria Continue Plavix and aspirin.  Generalized weakness -PT recommends home health    Body mass index is 23.32 kg/m.         Discharge Diagnoses:  Principal Problem:   COVID-19 virus infection Active Problems:    Essential hypertension   Type 2 diabetes mellitus (HCC)   Dyslipidemia   Acute CVA (cerebrovascular accident) ([place])   AKI (acute kidney injury) ([place])   Malnutrition of moderate degree   Subjective: Feels okay no complaints at this time  Discharge Exam: Vitals:   12/11/20 1213 12/11/20 1218  BP: (!) 142/79   Pulse: 81   Resp: (!) 30 18  Temp: 98.9 F (37.2 C)   SpO2: 99%    Vitals:   12/11/20 0609 12/11/20 0818 12/11/20 1213 12/11/20 1218  BP: 131/74 117/64 (!) 142/79   Pulse: 70 70 81   Resp: 14 16 (!) 30 18  Temp: 98.6 F (37 C) 98.8 F (37.1 C) 98.9 F (37.2 C)   TempSrc:   Oral   SpO2: 96% 97% 99%   Weight:      Height:        General: Pt is alert, awake, not in acute distress Cardiovascular: RRR, S1/S2 +, no rubs, no gallops Respiratory: CTA bilaterally, no wheezing, no rhonchi Abdominal: Soft, NT, ND, bowel sounds + Extremities: no edema, no cyanosis  Discharge Instructions   Allergies as of 12/11/2020      Reactions   Glimepiride Diarrhea, Other (See Comments)   Glipizide Other (See Comments)   Lisinopril Other (See Comments)   headache   Losartan Other (See Comments)   Headache   Norco [hydrocodone-acetaminophen] Other (See Comments)   Mood changes   Pioglitazone Swelling   On legs      Medication List    TAKE these medications   amLODipine 5 MG tablet Commonly known as: NORVASC Take 1 tablet (5 mg total) by mouth daily.   Aspirin Low Dose 81 MG EC  tablet Generic drug: aspirin Take 81 mg by mouth daily.   benazepril 20 MG tablet Commonly known as: LOTENSIN Take 20 mg by mouth 2 (two) times daily.   clopidogrel 75 MG tablet Commonly known as: PLAVIX Take 75 mg by mouth daily.   isosorbide mononitrate 60 MG 24 hr tablet Commonly known as: IMDUR Take 60 mg by mouth 2 (two) times daily.   Levemir FlexTouch 100 UNIT/ML FlexPen Generic drug: insulin detemir Inject 20 Units into the skin at bedtime.   metFORMIN 500 MG  tablet Commonly known as: GLUCOPHAGE Take 1,000 mg by mouth 2 (two) times daily.   rosuvastatin 40 MG tablet Commonly known as: CRESTOR Take 0.5 tablets (20 mg total) by mouth at bedtime.   spironolactone 25 MG tablet Commonly known as: ALDACTONE Take 25 mg by mouth daily.       Follow-up Information    [name], MD. Schedule an appointment as soon as possible for a visit in 1 week.   Specialty: Internal Medicine Contact information: [place] [id number] [phone]              Allergies  Allergen Reactions  . Glimepiride Diarrhea and Other (See Comments)  . Glipizide Other (See Comments)  . Lisinopril Other (See Comments)    headache  . Losartan Other (See Comments)    Headache  . Norco [Hydrocodone-Acetaminophen] Other (See Comments)    Mood changes  . Pioglitazone Swelling    On legs    You were cared for by a hospitalist during your hospital stay. If you have any questions about your discharge medications or the care you received while you were in the hospital after you are discharged, you can call the unit and asked to speak with the hospitalist on call if the hospitalist that took care of you is not available. Once you are discharged, your primary care physician will handle any further medical issues. Please note that no refills for any discharge medications will be authorized once you are discharged, as it is imperative that you return to your primary care physician (or establish a relationship with a primary care physician if you do not have one) for your aftercare needs so that they can reassess your need for medications and monitor your lab values.   Procedures/Studies: DG Chest 2 View  Result Date: 12/08/2020 CLINICAL DATA:  Weakness, swelling of legs, decreased appetite. EXAM: CHEST - 2 VIEW COMPARISON:  Chest x-ray 01/30/2020, CT chest 03/09/2016 FINDINGS: The heart size and mediastinal contours are  unchanged. Aortic arch calcification. No focal consolidation. No pulmonary edema. No pleural effusion. No pneumothorax. No acute osseous abnormality. Multilevel degenerative changes of the spine. IMPRESSION: No active cardiopulmonary disease. Electronically Signed   By: Iven Finn M.D.   On: 12/08/2020 22:47   CT ANGIO CHEST PE W OR WO CONTRAST  Result Date: 12/09/2020 CLINICAL DATA:  Shortness of breath PE suspected, low/intermediate prob, positive D-dimer COVID-19 Weakness. EXAM: CT ANGIOGRAPHY CHEST WITH CONTRAST TECHNIQUE: Multidetector CT imaging of the chest was performed using the standard protocol during bolus administration of intravenous contrast. Multiplanar CT image reconstructions and MIPs were obtained to evaluate the vascular anatomy. CONTRAST:  12mL OMNIPAQUE IOHEXOL 350 MG/ML SOLN COMPARISON:  Radiograph yesterday, CT 03/09/2016 FINDINGS: Cardiovascular: There are no filling defects within the pulmonary arteries to suggest pulmonary embolus. Atherosclerosis of the thoracic aorta without dissection or acute aortic findings. Upper normal heart size. There are coronary artery calcifications. No  pericardial effusion. Mediastinum/Nodes: Mild distal esophageal wall thickening. No enlarged mediastinal or hilar lymph nodes. Prominent right lobe of the thyroid is before, no discrete nodule. Lungs/Pleura: Mild multifocal geographic peripheral predominant ground-glass opacities throughout both lungs typical of COVID-19 pneumonia. No septal thickening or pulmonary edema. No pleural fluid. No pneumothorax. Trachea and central bronchi are patent. Upper Abdomen: No acute or unexpected findings. Musculoskeletal: There are no acute or suspicious osseous abnormalities. Thoracic spondylosis. Review of the MIP images confirms the above findings. IMPRESSION: 1. No pulmonary embolus. 2. Mild multifocal geographic ground-glass opacities throughout both lungs typical of COVID-19 pneumonia. 3. Mild distal  esophageal wall thickening, can be seen with reflux or esophagitis. 4. Coronary artery calcifications. Aortic Atherosclerosis (ICD10-I70.0). Electronically Signed   By: Keith Rake M.D.   On: 12/09/2020 02:51      The results of significant diagnostics from this hospitalization (including imaging, microbiology, ancillary and laboratory) are listed below for reference.     Microbiology: Recent Results (from the past 240 hour(s))  Resp Panel by RT-PCR (Flu A&B, Covid) Nasopharyngeal Swab     Status: Abnormal   Collection Time: 12/08/20  9:44 PM   Specimen: Nasopharyngeal Swab; Nasopharyngeal(NP) swabs in vial transport medium  Result Value Ref Range Status   SARS Coronavirus 2 by RT PCR POSITIVE (A) NEGATIVE Final    Comment: RESULT CALLED TO, READ BACK BY AND VERIFIED WITH: NOAH GRIFFITH 12/08/20 AT 2310 BY HS (NOTE) SARS-CoV-2 target nucleic acids are DETECTED.  The SARS-CoV-2 RNA is generally detectable in upper respiratory specimens during the acute phase of infection. Positive results are indicative of the presence of the identified virus, but do not rule out bacterial infection or co-infection with other pathogens not detected by the test. Clinical correlation with patient history and other diagnostic information is necessary to determine patient infection status. The expected result is Negative.  Fact Sheet for Patients: EntrepreneurPulse.com.au  Fact Sheet for Healthcare Providers: IncredibleEmployment.be  This test is not yet approved or cleared by the [place] FDA and  has been authorized for detection and/or diagnosis of SARS-CoV-2 by FDA under an Emergency Use Authorization (EUA).  This EUA will remain in effect (meaning this test can  be used) for the duration of  the COVID-19 declaration under Section 564(b)(1) of the Act, 21 U.S.C. section 360bbb-3(b)(1), unless the authorization is terminated or revoked sooner.      Influenza A by PCR NEGATIVE NEGATIVE Final   Influenza B by PCR NEGATIVE NEGATIVE Final    Comment: (NOTE) The Xpert Xpress SARS-CoV-2/FLU/RSV plus assay is intended as an aid in the diagnosis of influenza from Nasopharyngeal swab specimens and should not be used as a sole basis for treatment. Nasal washings and aspirates are unacceptable for Xpert Xpress SARS-CoV-2/FLU/RSV testing.  Fact Sheet for Patients: EntrepreneurPulse.com.au  Fact Sheet for Healthcare Providers: IncredibleEmployment.be  This test is not yet approved or cleared by the [place] FDA and has been authorized for detection and/or diagnosis of SARS-CoV-2 by FDA under an Emergency Use Authorization (EUA). This EUA will remain in effect (meaning this test can be used) for the duration of the COVID-19 declaration under Section 564(b)(1) of the Act, 21 U.S.C. section 360bbb-3(b)(1), unless the authorization is terminated or revoked.  Performed at [facility], [address] [place], [place] [address]      Labs: BNP (last 3 results) Recent Labs    12/08/20 2144  BNP 93.7   Basic Metabolic Panel: Recent Labs  Lab 12/08/20 2144 12/10/20  0326 12/11/20 0541  NA 132* 138  --   K 4.5 3.8  --   CL 94* 105  --   CO2 23 23  --   GLUCOSE 200* 157*  --   BUN 25* 24*  --   CREATININE 1.30* 0.92  --   CALCIUM 9.4 9.1  --   MG  --  2.1 2.1   Liver Function Tests: Recent Labs  Lab 12/08/20 2144 12/10/20 0326  AST 29 26  ALT 24 21  ALKPHOS 57 47  BILITOT 0.6 0.5  PROT 7.8 6.6  ALBUMIN 4.0 3.3*   No results for input(s): LIPASE, AMYLASE in the last 168 hours. No results for input(s): AMMONIA in the last 168 hours. CBC: Recent Labs  Lab 12/08/20 2144 12/10/20 0326  WBC 6.2 6.3  NEUTROABS 4.7  --   HGB 12.0 10.7*  HCT 35.8* 31.2*  MCV 89.9 88.6  PLT 241 268   Cardiac Enzymes: No results for input(s): CKTOTAL, CKMB, CKMBINDEX, TROPONINI in the  last 168 hours. BNP: Invalid input(s): POCBNP CBG: Recent Labs  Lab 12/10/20 2114 12/11/20 0108 12/11/20 0611 12/11/20 0819 12/11/20 1217  GLUCAP 75 87 160* 119* 147*   D-Dimer Recent Labs    12/11/20 0541  DDIMER 0.45   Hgb A1c Recent Labs    12/09/20 1104  HGBA1C 7.0*   Lipid Profile No results for input(s): CHOL, HDL, LDLCALC, TRIG, CHOLHDL, LDLDIRECT in the last 72 hours. Thyroid function studies No results for input(s): TSH, T4TOTAL, T3FREE, THYROIDAB in the last 72 hours.  Invalid input(s): FREET3 Anemia work up Recent Labs    12/10/20 0326 12/11/20 0541  FERRITIN 486* 425*   Urinalysis    Component Value Date/Time   COLORURINE YELLOW (A) 04/01/2016 1100   APPEARANCEUR HAZY (A) 04/01/2016 1100   LABSPEC 1.014 04/01/2016 1100   PHURINE 5.0 04/01/2016 1100   GLUCOSEU >500 (A) 04/01/2016 1100   HGBUR NEGATIVE 04/01/2016 1100   BILIRUBINUR NEGATIVE 04/01/2016 1100   KETONESUR TRACE (A) 04/01/2016 1100   PROTEINUR 30 (A) 04/01/2016 1100   NITRITE NEGATIVE 04/01/2016 1100   LEUKOCYTESUR NEGATIVE 04/01/2016 1100   Sepsis Labs Invalid input(s): PROCALCITONIN,  WBC,  LACTICIDVEN Microbiology Recent Results (from the past 240 hour(s))  Resp Panel by RT-PCR (Flu A&B, Covid) Nasopharyngeal Swab     Status: Abnormal   Collection Time: 12/08/20  9:44 PM   Specimen: Nasopharyngeal Swab; Nasopharyngeal(NP) swabs in vial transport medium  Result Value Ref Range Status   SARS Coronavirus 2 by RT PCR POSITIVE (A) NEGATIVE Final    Comment: RESULT CALLED TO, READ BACK BY AND VERIFIED WITH: NOAH GRIFFITH 12/08/20 AT 2310 BY HS (NOTE) SARS-CoV-2 target nucleic acids are DETECTED.  The SARS-CoV-2 RNA is generally detectable in upper respiratory specimens during the acute phase of infection. Positive results are indicative of the presence of the identified virus, but do not rule out bacterial infection or co-infection with other pathogens not detected by the test.  Clinical correlation with patient history and other diagnostic information is necessary to determine patient infection status. The expected result is Negative.  Fact Sheet for Patients: EntrepreneurPulse.com.au  Fact Sheet for Healthcare Providers: IncredibleEmployment.be  This test is not yet approved or cleared by the [place] FDA and  has been authorized for detection and/or diagnosis of SARS-CoV-2 by FDA under an Emergency Use Authorization (EUA).  This EUA will remain in effect (meaning this test can  be used) for the duration of  the COVID-19  declaration under Section 564(b)(1) of the Act, 21 U.S.C. section 360bbb-3(b)(1), unless the authorization is terminated or revoked sooner.     Influenza A by PCR NEGATIVE NEGATIVE Final   Influenza B by PCR NEGATIVE NEGATIVE Final    Comment: (NOTE) The Xpert Xpress SARS-CoV-2/FLU/RSV plus assay is intended as an aid in the diagnosis of influenza from Nasopharyngeal swab specimens and should not be used as a sole basis for treatment. Nasal washings and aspirates are unacceptable for Xpert Xpress SARS-CoV-2/FLU/RSV testing.  Fact Sheet for Patients: EntrepreneurPulse.com.au  Fact Sheet for Healthcare Providers: IncredibleEmployment.be  This test is not yet approved or cleared by the [place] FDA and has been authorized for detection and/or diagnosis of SARS-CoV-2 by FDA under an Emergency Use Authorization (EUA). This EUA will remain in effect (meaning this test can be used) for the duration of the COVID-19 declaration under Section 564(b)(1) of the Act, 21 U.S.C. section 360bbb-3(b)(1), unless the authorization is terminated or revoked.  Performed at [facility], [address] [place], [place] [address]      Time coordinating discharge:  I have spent 35 minutes face to face with the patient and on the ward discussing the patients  care, assessment, plan and disposition with other care givers. >50% of the time was devoted counseling the patient about the risks and benefits of treatment/Discharge disposition and coordinating care.   SIGNED:   Damita Lack, MD  [facility] 12/11/2020, 2:27 PM   If 7PM-7AM, please contact night-coverage

## 2020-12-11 NOTE — Progress Notes (Signed)
Patient discharged to home.  Leaves the unit via w/c with all belongings and AVS.  Patient educated that no changes were made to her home medications upon discharge and her PCP office was at lunch so f/u appointment was not made and she will need to schedule a f/u.  Patient voiced understanding and states that her cousin is [place] [facility] [place] entrance to take her home.

## 2020-12-11 NOTE — Care Management Important Message (Signed)
Important Message  Patient Details  Name: Sabrina [name] MRN: [id number] Date of Birth: 1946-09-01   Medicare Important Message Given:  Yes  Patient is in isolation and I talked to her by phone [phone] and reviewed the Important Message from Medicare with her.  She stated she had a copy and was aware of her rights and did not want another copy. Said she was in agreement with her discharge plan but was hoping to receive 5 days of IV medications. I directed her to call Sabrina [name] if she changed her mind and wished to appeal the discharge.  She said she had put it in God's hands and would discharge as planned.   Juliann Pulse A Dawnell Bryant 12/11/2020, 12:14 PM

## 2020-12-13 DIAGNOSIS — E11319 Type 2 diabetes mellitus with unspecified diabetic retinopathy without macular edema: Secondary | ICD-10-CM | POA: Diagnosis not present

## 2020-12-13 DIAGNOSIS — Z7902 Long term (current) use of antithrombotics/antiplatelets: Secondary | ICD-10-CM | POA: Diagnosis not present

## 2020-12-13 DIAGNOSIS — E440 Moderate protein-calorie malnutrition: Secondary | ICD-10-CM | POA: Diagnosis not present

## 2020-12-13 DIAGNOSIS — Z7984 Long term (current) use of oral hypoglycemic drugs: Secondary | ICD-10-CM | POA: Diagnosis not present

## 2020-12-13 DIAGNOSIS — E785 Hyperlipidemia, unspecified: Secondary | ICD-10-CM | POA: Diagnosis not present

## 2020-12-13 DIAGNOSIS — Z794 Long term (current) use of insulin: Secondary | ICD-10-CM | POA: Diagnosis not present

## 2020-12-13 DIAGNOSIS — I10 Essential (primary) hypertension: Secondary | ICD-10-CM | POA: Diagnosis not present

## 2020-12-13 DIAGNOSIS — U071 COVID-19: Secondary | ICD-10-CM | POA: Diagnosis not present

## 2020-12-13 DIAGNOSIS — I69322 Dysarthria following cerebral infarction: Secondary | ICD-10-CM | POA: Diagnosis not present

## 2020-12-18 DIAGNOSIS — E440 Moderate protein-calorie malnutrition: Secondary | ICD-10-CM | POA: Diagnosis not present

## 2020-12-18 DIAGNOSIS — Z794 Long term (current) use of insulin: Secondary | ICD-10-CM | POA: Diagnosis not present

## 2020-12-18 DIAGNOSIS — Z7902 Long term (current) use of antithrombotics/antiplatelets: Secondary | ICD-10-CM | POA: Diagnosis not present

## 2020-12-18 DIAGNOSIS — U071 COVID-19: Secondary | ICD-10-CM | POA: Diagnosis not present

## 2020-12-18 DIAGNOSIS — I10 Essential (primary) hypertension: Secondary | ICD-10-CM | POA: Diagnosis not present

## 2020-12-18 DIAGNOSIS — E785 Hyperlipidemia, unspecified: Secondary | ICD-10-CM | POA: Diagnosis not present

## 2020-12-18 DIAGNOSIS — I69322 Dysarthria following cerebral infarction: Secondary | ICD-10-CM | POA: Diagnosis not present

## 2020-12-18 DIAGNOSIS — Z7984 Long term (current) use of oral hypoglycemic drugs: Secondary | ICD-10-CM | POA: Diagnosis not present

## 2020-12-18 DIAGNOSIS — E11319 Type 2 diabetes mellitus with unspecified diabetic retinopathy without macular edema: Secondary | ICD-10-CM | POA: Diagnosis not present

## 2020-12-20 DIAGNOSIS — I69322 Dysarthria following cerebral infarction: Secondary | ICD-10-CM | POA: Diagnosis not present

## 2020-12-20 DIAGNOSIS — I10 Essential (primary) hypertension: Secondary | ICD-10-CM | POA: Diagnosis not present

## 2020-12-20 DIAGNOSIS — U071 COVID-19: Secondary | ICD-10-CM | POA: Diagnosis not present

## 2020-12-20 DIAGNOSIS — Z794 Long term (current) use of insulin: Secondary | ICD-10-CM | POA: Diagnosis not present

## 2020-12-20 DIAGNOSIS — E11319 Type 2 diabetes mellitus with unspecified diabetic retinopathy without macular edema: Secondary | ICD-10-CM | POA: Diagnosis not present

## 2020-12-20 DIAGNOSIS — E785 Hyperlipidemia, unspecified: Secondary | ICD-10-CM | POA: Diagnosis not present

## 2020-12-20 DIAGNOSIS — Z7984 Long term (current) use of oral hypoglycemic drugs: Secondary | ICD-10-CM | POA: Diagnosis not present

## 2020-12-20 DIAGNOSIS — E440 Moderate protein-calorie malnutrition: Secondary | ICD-10-CM | POA: Diagnosis not present

## 2020-12-20 DIAGNOSIS — Z7902 Long term (current) use of antithrombotics/antiplatelets: Secondary | ICD-10-CM | POA: Diagnosis not present

## 2020-12-23 DIAGNOSIS — E785 Hyperlipidemia, unspecified: Secondary | ICD-10-CM | POA: Diagnosis not present

## 2020-12-23 DIAGNOSIS — E440 Moderate protein-calorie malnutrition: Secondary | ICD-10-CM | POA: Diagnosis not present

## 2020-12-23 DIAGNOSIS — E11319 Type 2 diabetes mellitus with unspecified diabetic retinopathy without macular edema: Secondary | ICD-10-CM | POA: Diagnosis not present

## 2020-12-23 DIAGNOSIS — I69322 Dysarthria following cerebral infarction: Secondary | ICD-10-CM | POA: Diagnosis not present

## 2020-12-23 DIAGNOSIS — I10 Essential (primary) hypertension: Secondary | ICD-10-CM | POA: Diagnosis not present

## 2020-12-23 DIAGNOSIS — Z794 Long term (current) use of insulin: Secondary | ICD-10-CM | POA: Diagnosis not present

## 2020-12-23 DIAGNOSIS — Z7902 Long term (current) use of antithrombotics/antiplatelets: Secondary | ICD-10-CM | POA: Diagnosis not present

## 2020-12-23 DIAGNOSIS — Z7984 Long term (current) use of oral hypoglycemic drugs: Secondary | ICD-10-CM | POA: Diagnosis not present

## 2020-12-23 DIAGNOSIS — U071 COVID-19: Secondary | ICD-10-CM | POA: Diagnosis not present

## 2020-12-24 DIAGNOSIS — R4781 Slurred speech: Secondary | ICD-10-CM | POA: Diagnosis not present

## 2020-12-24 DIAGNOSIS — R410 Disorientation, unspecified: Secondary | ICD-10-CM | POA: Diagnosis not present

## 2020-12-24 DIAGNOSIS — R29898 Other symptoms and signs involving the musculoskeletal system: Secondary | ICD-10-CM | POA: Diagnosis not present

## 2020-12-24 DIAGNOSIS — U071 COVID-19: Secondary | ICD-10-CM | POA: Diagnosis not present

## 2020-12-24 DIAGNOSIS — Z8659 Personal history of other mental and behavioral disorders: Secondary | ICD-10-CM | POA: Diagnosis not present

## 2020-12-24 DIAGNOSIS — Z8673 Personal history of transient ischemic attack (TIA), and cerebral infarction without residual deficits: Secondary | ICD-10-CM | POA: Diagnosis not present

## 2020-12-24 DIAGNOSIS — J1282 Pneumonia due to coronavirus disease 2019: Secondary | ICD-10-CM | POA: Diagnosis not present

## 2020-12-25 DIAGNOSIS — E538 Deficiency of other specified B group vitamins: Secondary | ICD-10-CM | POA: Diagnosis not present

## 2020-12-25 DIAGNOSIS — J189 Pneumonia, unspecified organism: Secondary | ICD-10-CM | POA: Diagnosis not present

## 2020-12-25 DIAGNOSIS — R8290 Unspecified abnormal findings in urine: Secondary | ICD-10-CM | POA: Diagnosis not present

## 2020-12-25 DIAGNOSIS — E785 Hyperlipidemia, unspecified: Secondary | ICD-10-CM | POA: Diagnosis not present

## 2020-12-25 DIAGNOSIS — U071 COVID-19: Secondary | ICD-10-CM | POA: Diagnosis not present

## 2020-12-25 DIAGNOSIS — I10 Essential (primary) hypertension: Secondary | ICD-10-CM | POA: Diagnosis not present

## 2020-12-25 DIAGNOSIS — Z0000 Encounter for general adult medical examination without abnormal findings: Secondary | ICD-10-CM | POA: Diagnosis not present

## 2020-12-25 DIAGNOSIS — E119 Type 2 diabetes mellitus without complications: Secondary | ICD-10-CM | POA: Diagnosis not present

## 2020-12-25 DIAGNOSIS — J1282 Pneumonia due to coronavirus disease 2019: Secondary | ICD-10-CM | POA: Diagnosis not present

## 2020-12-25 DIAGNOSIS — Z1211 Encounter for screening for malignant neoplasm of colon: Secondary | ICD-10-CM | POA: Diagnosis not present

## 2020-12-26 DIAGNOSIS — E785 Hyperlipidemia, unspecified: Secondary | ICD-10-CM | POA: Diagnosis not present

## 2020-12-26 DIAGNOSIS — E11319 Type 2 diabetes mellitus with unspecified diabetic retinopathy without macular edema: Secondary | ICD-10-CM | POA: Diagnosis not present

## 2020-12-26 DIAGNOSIS — U071 COVID-19: Secondary | ICD-10-CM | POA: Diagnosis not present

## 2020-12-26 DIAGNOSIS — Z7984 Long term (current) use of oral hypoglycemic drugs: Secondary | ICD-10-CM | POA: Diagnosis not present

## 2020-12-26 DIAGNOSIS — I69322 Dysarthria following cerebral infarction: Secondary | ICD-10-CM | POA: Diagnosis not present

## 2020-12-26 DIAGNOSIS — E440 Moderate protein-calorie malnutrition: Secondary | ICD-10-CM | POA: Diagnosis not present

## 2020-12-26 DIAGNOSIS — Z794 Long term (current) use of insulin: Secondary | ICD-10-CM | POA: Diagnosis not present

## 2020-12-26 DIAGNOSIS — I10 Essential (primary) hypertension: Secondary | ICD-10-CM | POA: Diagnosis not present

## 2020-12-26 DIAGNOSIS — Z7902 Long term (current) use of antithrombotics/antiplatelets: Secondary | ICD-10-CM | POA: Diagnosis not present

## 2020-12-27 DIAGNOSIS — E785 Hyperlipidemia, unspecified: Secondary | ICD-10-CM | POA: Diagnosis not present

## 2020-12-27 DIAGNOSIS — Z794 Long term (current) use of insulin: Secondary | ICD-10-CM | POA: Diagnosis not present

## 2020-12-27 DIAGNOSIS — I69322 Dysarthria following cerebral infarction: Secondary | ICD-10-CM | POA: Diagnosis not present

## 2020-12-27 DIAGNOSIS — I10 Essential (primary) hypertension: Secondary | ICD-10-CM | POA: Diagnosis not present

## 2020-12-27 DIAGNOSIS — U071 COVID-19: Secondary | ICD-10-CM | POA: Diagnosis not present

## 2020-12-27 DIAGNOSIS — E11319 Type 2 diabetes mellitus with unspecified diabetic retinopathy without macular edema: Secondary | ICD-10-CM | POA: Diagnosis not present

## 2020-12-27 DIAGNOSIS — E440 Moderate protein-calorie malnutrition: Secondary | ICD-10-CM | POA: Diagnosis not present

## 2020-12-30 DIAGNOSIS — I10 Essential (primary) hypertension: Secondary | ICD-10-CM | POA: Diagnosis not present

## 2020-12-30 DIAGNOSIS — Z794 Long term (current) use of insulin: Secondary | ICD-10-CM | POA: Diagnosis not present

## 2020-12-30 DIAGNOSIS — E785 Hyperlipidemia, unspecified: Secondary | ICD-10-CM | POA: Diagnosis not present

## 2020-12-30 DIAGNOSIS — E11319 Type 2 diabetes mellitus with unspecified diabetic retinopathy without macular edema: Secondary | ICD-10-CM | POA: Diagnosis not present

## 2020-12-30 DIAGNOSIS — U071 COVID-19: Secondary | ICD-10-CM | POA: Diagnosis not present

## 2020-12-30 DIAGNOSIS — Z7984 Long term (current) use of oral hypoglycemic drugs: Secondary | ICD-10-CM | POA: Diagnosis not present

## 2020-12-30 DIAGNOSIS — Z7902 Long term (current) use of antithrombotics/antiplatelets: Secondary | ICD-10-CM | POA: Diagnosis not present

## 2020-12-30 DIAGNOSIS — I69322 Dysarthria following cerebral infarction: Secondary | ICD-10-CM | POA: Diagnosis not present

## 2020-12-30 DIAGNOSIS — E440 Moderate protein-calorie malnutrition: Secondary | ICD-10-CM | POA: Diagnosis not present

## 2021-01-01 DIAGNOSIS — Z7984 Long term (current) use of oral hypoglycemic drugs: Secondary | ICD-10-CM | POA: Diagnosis not present

## 2021-01-01 DIAGNOSIS — Z794 Long term (current) use of insulin: Secondary | ICD-10-CM | POA: Diagnosis not present

## 2021-01-01 DIAGNOSIS — I10 Essential (primary) hypertension: Secondary | ICD-10-CM | POA: Diagnosis not present

## 2021-01-01 DIAGNOSIS — E440 Moderate protein-calorie malnutrition: Secondary | ICD-10-CM | POA: Diagnosis not present

## 2021-01-01 DIAGNOSIS — Z7902 Long term (current) use of antithrombotics/antiplatelets: Secondary | ICD-10-CM | POA: Diagnosis not present

## 2021-01-01 DIAGNOSIS — U071 COVID-19: Secondary | ICD-10-CM | POA: Diagnosis not present

## 2021-01-01 DIAGNOSIS — E11319 Type 2 diabetes mellitus with unspecified diabetic retinopathy without macular edema: Secondary | ICD-10-CM | POA: Diagnosis not present

## 2021-01-01 DIAGNOSIS — I69322 Dysarthria following cerebral infarction: Secondary | ICD-10-CM | POA: Diagnosis not present

## 2021-01-01 DIAGNOSIS — E785 Hyperlipidemia, unspecified: Secondary | ICD-10-CM | POA: Diagnosis not present

## 2021-01-06 DIAGNOSIS — U071 COVID-19: Secondary | ICD-10-CM | POA: Diagnosis not present

## 2021-01-06 DIAGNOSIS — Z7984 Long term (current) use of oral hypoglycemic drugs: Secondary | ICD-10-CM | POA: Diagnosis not present

## 2021-01-06 DIAGNOSIS — E440 Moderate protein-calorie malnutrition: Secondary | ICD-10-CM | POA: Diagnosis not present

## 2021-01-06 DIAGNOSIS — Z794 Long term (current) use of insulin: Secondary | ICD-10-CM | POA: Diagnosis not present

## 2021-01-06 DIAGNOSIS — I10 Essential (primary) hypertension: Secondary | ICD-10-CM | POA: Diagnosis not present

## 2021-01-06 DIAGNOSIS — E11319 Type 2 diabetes mellitus with unspecified diabetic retinopathy without macular edema: Secondary | ICD-10-CM | POA: Diagnosis not present

## 2021-01-06 DIAGNOSIS — I69322 Dysarthria following cerebral infarction: Secondary | ICD-10-CM | POA: Diagnosis not present

## 2021-01-06 DIAGNOSIS — E785 Hyperlipidemia, unspecified: Secondary | ICD-10-CM | POA: Diagnosis not present

## 2021-01-06 DIAGNOSIS — Z7902 Long term (current) use of antithrombotics/antiplatelets: Secondary | ICD-10-CM | POA: Diagnosis not present

## 2021-01-08 DIAGNOSIS — U071 COVID-19: Secondary | ICD-10-CM | POA: Diagnosis not present

## 2021-01-08 DIAGNOSIS — E440 Moderate protein-calorie malnutrition: Secondary | ICD-10-CM | POA: Diagnosis not present

## 2021-01-08 DIAGNOSIS — I10 Essential (primary) hypertension: Secondary | ICD-10-CM | POA: Diagnosis not present

## 2021-01-08 DIAGNOSIS — Z794 Long term (current) use of insulin: Secondary | ICD-10-CM | POA: Diagnosis not present

## 2021-01-08 DIAGNOSIS — E11319 Type 2 diabetes mellitus with unspecified diabetic retinopathy without macular edema: Secondary | ICD-10-CM | POA: Diagnosis not present

## 2021-01-08 DIAGNOSIS — E785 Hyperlipidemia, unspecified: Secondary | ICD-10-CM | POA: Diagnosis not present

## 2021-01-08 DIAGNOSIS — Z7984 Long term (current) use of oral hypoglycemic drugs: Secondary | ICD-10-CM | POA: Diagnosis not present

## 2021-01-08 DIAGNOSIS — Z7902 Long term (current) use of antithrombotics/antiplatelets: Secondary | ICD-10-CM | POA: Diagnosis not present

## 2021-01-08 DIAGNOSIS — I69322 Dysarthria following cerebral infarction: Secondary | ICD-10-CM | POA: Diagnosis not present

## 2021-01-09 DIAGNOSIS — I69322 Dysarthria following cerebral infarction: Secondary | ICD-10-CM | POA: Diagnosis not present

## 2021-01-09 DIAGNOSIS — Z7984 Long term (current) use of oral hypoglycemic drugs: Secondary | ICD-10-CM | POA: Diagnosis not present

## 2021-01-09 DIAGNOSIS — Z7902 Long term (current) use of antithrombotics/antiplatelets: Secondary | ICD-10-CM | POA: Diagnosis not present

## 2021-01-09 DIAGNOSIS — I10 Essential (primary) hypertension: Secondary | ICD-10-CM | POA: Diagnosis not present

## 2021-01-09 DIAGNOSIS — U071 COVID-19: Secondary | ICD-10-CM | POA: Diagnosis not present

## 2021-01-09 DIAGNOSIS — E440 Moderate protein-calorie malnutrition: Secondary | ICD-10-CM | POA: Diagnosis not present

## 2021-01-09 DIAGNOSIS — E11319 Type 2 diabetes mellitus with unspecified diabetic retinopathy without macular edema: Secondary | ICD-10-CM | POA: Diagnosis not present

## 2021-01-09 DIAGNOSIS — E785 Hyperlipidemia, unspecified: Secondary | ICD-10-CM | POA: Diagnosis not present

## 2021-01-09 DIAGNOSIS — Z794 Long term (current) use of insulin: Secondary | ICD-10-CM | POA: Diagnosis not present

## 2021-01-12 DIAGNOSIS — E440 Moderate protein-calorie malnutrition: Secondary | ICD-10-CM | POA: Diagnosis not present

## 2021-01-12 DIAGNOSIS — U071 COVID-19: Secondary | ICD-10-CM | POA: Diagnosis not present

## 2021-01-12 DIAGNOSIS — I69322 Dysarthria following cerebral infarction: Secondary | ICD-10-CM | POA: Diagnosis not present

## 2021-01-12 DIAGNOSIS — Z7902 Long term (current) use of antithrombotics/antiplatelets: Secondary | ICD-10-CM | POA: Diagnosis not present

## 2021-01-12 DIAGNOSIS — E785 Hyperlipidemia, unspecified: Secondary | ICD-10-CM | POA: Diagnosis not present

## 2021-01-12 DIAGNOSIS — I10 Essential (primary) hypertension: Secondary | ICD-10-CM | POA: Diagnosis not present

## 2021-01-12 DIAGNOSIS — E11319 Type 2 diabetes mellitus with unspecified diabetic retinopathy without macular edema: Secondary | ICD-10-CM | POA: Diagnosis not present

## 2021-01-12 DIAGNOSIS — Z7984 Long term (current) use of oral hypoglycemic drugs: Secondary | ICD-10-CM | POA: Diagnosis not present

## 2021-01-12 DIAGNOSIS — Z794 Long term (current) use of insulin: Secondary | ICD-10-CM | POA: Diagnosis not present

## 2021-01-13 DIAGNOSIS — I10 Essential (primary) hypertension: Secondary | ICD-10-CM | POA: Diagnosis not present

## 2021-01-13 DIAGNOSIS — Z794 Long term (current) use of insulin: Secondary | ICD-10-CM | POA: Diagnosis not present

## 2021-01-13 DIAGNOSIS — E785 Hyperlipidemia, unspecified: Secondary | ICD-10-CM | POA: Diagnosis not present

## 2021-01-13 DIAGNOSIS — Z7902 Long term (current) use of antithrombotics/antiplatelets: Secondary | ICD-10-CM | POA: Diagnosis not present

## 2021-01-13 DIAGNOSIS — E440 Moderate protein-calorie malnutrition: Secondary | ICD-10-CM | POA: Diagnosis not present

## 2021-01-13 DIAGNOSIS — U071 COVID-19: Secondary | ICD-10-CM | POA: Diagnosis not present

## 2021-01-13 DIAGNOSIS — Z7984 Long term (current) use of oral hypoglycemic drugs: Secondary | ICD-10-CM | POA: Diagnosis not present

## 2021-01-13 DIAGNOSIS — I69322 Dysarthria following cerebral infarction: Secondary | ICD-10-CM | POA: Diagnosis not present

## 2021-01-13 DIAGNOSIS — E11319 Type 2 diabetes mellitus with unspecified diabetic retinopathy without macular edema: Secondary | ICD-10-CM | POA: Diagnosis not present

## 2021-01-14 DIAGNOSIS — E11319 Type 2 diabetes mellitus with unspecified diabetic retinopathy without macular edema: Secondary | ICD-10-CM | POA: Diagnosis not present

## 2021-01-14 DIAGNOSIS — I69322 Dysarthria following cerebral infarction: Secondary | ICD-10-CM | POA: Diagnosis not present

## 2021-01-14 DIAGNOSIS — U071 COVID-19: Secondary | ICD-10-CM | POA: Diagnosis not present

## 2021-01-14 DIAGNOSIS — I10 Essential (primary) hypertension: Secondary | ICD-10-CM | POA: Diagnosis not present

## 2021-01-14 DIAGNOSIS — E785 Hyperlipidemia, unspecified: Secondary | ICD-10-CM | POA: Diagnosis not present

## 2021-01-14 DIAGNOSIS — E440 Moderate protein-calorie malnutrition: Secondary | ICD-10-CM | POA: Diagnosis not present

## 2021-01-14 DIAGNOSIS — Z7902 Long term (current) use of antithrombotics/antiplatelets: Secondary | ICD-10-CM | POA: Diagnosis not present

## 2021-01-14 DIAGNOSIS — Z7984 Long term (current) use of oral hypoglycemic drugs: Secondary | ICD-10-CM | POA: Diagnosis not present

## 2021-01-14 DIAGNOSIS — Z794 Long term (current) use of insulin: Secondary | ICD-10-CM | POA: Diagnosis not present

## 2021-01-21 DIAGNOSIS — Z794 Long term (current) use of insulin: Secondary | ICD-10-CM | POA: Diagnosis not present

## 2021-01-21 DIAGNOSIS — I10 Essential (primary) hypertension: Secondary | ICD-10-CM | POA: Diagnosis not present

## 2021-01-21 DIAGNOSIS — Z7984 Long term (current) use of oral hypoglycemic drugs: Secondary | ICD-10-CM | POA: Diagnosis not present

## 2021-01-21 DIAGNOSIS — E785 Hyperlipidemia, unspecified: Secondary | ICD-10-CM | POA: Diagnosis not present

## 2021-01-21 DIAGNOSIS — E11319 Type 2 diabetes mellitus with unspecified diabetic retinopathy without macular edema: Secondary | ICD-10-CM | POA: Diagnosis not present

## 2021-01-21 DIAGNOSIS — I69322 Dysarthria following cerebral infarction: Secondary | ICD-10-CM | POA: Diagnosis not present

## 2021-01-21 DIAGNOSIS — Z7902 Long term (current) use of antithrombotics/antiplatelets: Secondary | ICD-10-CM | POA: Diagnosis not present

## 2021-01-21 DIAGNOSIS — E440 Moderate protein-calorie malnutrition: Secondary | ICD-10-CM | POA: Diagnosis not present

## 2021-01-21 DIAGNOSIS — U071 COVID-19: Secondary | ICD-10-CM | POA: Diagnosis not present

## 2021-01-31 DIAGNOSIS — E440 Moderate protein-calorie malnutrition: Secondary | ICD-10-CM | POA: Diagnosis not present

## 2021-01-31 DIAGNOSIS — I69322 Dysarthria following cerebral infarction: Secondary | ICD-10-CM | POA: Diagnosis not present

## 2021-01-31 DIAGNOSIS — U071 COVID-19: Secondary | ICD-10-CM | POA: Diagnosis not present

## 2021-01-31 DIAGNOSIS — Z7984 Long term (current) use of oral hypoglycemic drugs: Secondary | ICD-10-CM | POA: Diagnosis not present

## 2021-01-31 DIAGNOSIS — I10 Essential (primary) hypertension: Secondary | ICD-10-CM | POA: Diagnosis not present

## 2021-01-31 DIAGNOSIS — E11319 Type 2 diabetes mellitus with unspecified diabetic retinopathy without macular edema: Secondary | ICD-10-CM | POA: Diagnosis not present

## 2021-01-31 DIAGNOSIS — Z794 Long term (current) use of insulin: Secondary | ICD-10-CM | POA: Diagnosis not present

## 2021-01-31 DIAGNOSIS — E785 Hyperlipidemia, unspecified: Secondary | ICD-10-CM | POA: Diagnosis not present

## 2021-01-31 DIAGNOSIS — Z7902 Long term (current) use of antithrombotics/antiplatelets: Secondary | ICD-10-CM | POA: Diagnosis not present

## 2021-02-05 DIAGNOSIS — Z794 Long term (current) use of insulin: Secondary | ICD-10-CM | POA: Diagnosis not present

## 2021-02-05 DIAGNOSIS — Z7984 Long term (current) use of oral hypoglycemic drugs: Secondary | ICD-10-CM | POA: Diagnosis not present

## 2021-02-05 DIAGNOSIS — E11319 Type 2 diabetes mellitus with unspecified diabetic retinopathy without macular edema: Secondary | ICD-10-CM | POA: Diagnosis not present

## 2021-02-05 DIAGNOSIS — I10 Essential (primary) hypertension: Secondary | ICD-10-CM | POA: Diagnosis not present

## 2021-02-05 DIAGNOSIS — I69322 Dysarthria following cerebral infarction: Secondary | ICD-10-CM | POA: Diagnosis not present

## 2021-02-05 DIAGNOSIS — Z7902 Long term (current) use of antithrombotics/antiplatelets: Secondary | ICD-10-CM | POA: Diagnosis not present

## 2021-02-05 DIAGNOSIS — E785 Hyperlipidemia, unspecified: Secondary | ICD-10-CM | POA: Diagnosis not present

## 2021-02-05 DIAGNOSIS — E440 Moderate protein-calorie malnutrition: Secondary | ICD-10-CM | POA: Diagnosis not present

## 2021-02-05 DIAGNOSIS — U071 COVID-19: Secondary | ICD-10-CM | POA: Diagnosis not present

## 2021-03-10 DIAGNOSIS — H524 Presbyopia: Secondary | ICD-10-CM | POA: Diagnosis not present

## 2021-03-10 DIAGNOSIS — E113393 Type 2 diabetes mellitus with moderate nonproliferative diabetic retinopathy without macular edema, bilateral: Secondary | ICD-10-CM | POA: Diagnosis not present

## 2021-04-16 ENCOUNTER — Inpatient Hospital Stay
Admission: EM | Admit: 2021-04-16 | Discharge: 2021-04-19 | DRG: 872 | Disposition: A | Discharge status: Home Health | Payer: Medicare HMO | Attending: Internal Medicine | Admitting: Internal Medicine

## 2021-04-16 ENCOUNTER — Emergency Department: Payer: Medicare HMO

## 2021-04-16 ENCOUNTER — Inpatient Hospital Stay: Payer: Medicare HMO

## 2021-04-16 ENCOUNTER — Other Ambulatory Visit: Payer: Self-pay

## 2021-04-16 DIAGNOSIS — I10 Essential (primary) hypertension: Secondary | ICD-10-CM | POA: Diagnosis present

## 2021-04-16 DIAGNOSIS — D259 Leiomyoma of uterus, unspecified: Secondary | ICD-10-CM | POA: Diagnosis not present

## 2021-04-16 DIAGNOSIS — A419 Sepsis, unspecified organism: Principal | ICD-10-CM | POA: Diagnosis present

## 2021-04-16 DIAGNOSIS — M25551 Pain in right hip: Secondary | ICD-10-CM | POA: Diagnosis present

## 2021-04-16 DIAGNOSIS — Z7982 Long term (current) use of aspirin: Secondary | ICD-10-CM | POA: Diagnosis not present

## 2021-04-16 DIAGNOSIS — E1169 Type 2 diabetes mellitus with other specified complication: Secondary | ICD-10-CM

## 2021-04-16 DIAGNOSIS — T796XXA Traumatic ischemia of muscle, initial encounter: Secondary | ICD-10-CM

## 2021-04-16 DIAGNOSIS — E119 Type 2 diabetes mellitus without complications: Secondary | ICD-10-CM

## 2021-04-16 DIAGNOSIS — Z888 Allergy status to other drugs, medicaments and biological substances status: Secondary | ICD-10-CM | POA: Diagnosis not present

## 2021-04-16 DIAGNOSIS — N8189 Other female genital prolapse: Secondary | ICD-10-CM | POA: Diagnosis present

## 2021-04-16 DIAGNOSIS — R634 Abnormal weight loss: Secondary | ICD-10-CM | POA: Diagnosis not present

## 2021-04-16 DIAGNOSIS — R000 Tachycardia, unspecified: Secondary | ICD-10-CM | POA: Diagnosis present

## 2021-04-16 DIAGNOSIS — N3001 Acute cystitis with hematuria: Secondary | ICD-10-CM | POA: Diagnosis not present

## 2021-04-16 DIAGNOSIS — N390 Urinary tract infection, site not specified: Secondary | ICD-10-CM | POA: Diagnosis not present

## 2021-04-16 DIAGNOSIS — R509 Fever, unspecified: Secondary | ICD-10-CM | POA: Diagnosis not present

## 2021-04-16 DIAGNOSIS — K769 Liver disease, unspecified: Secondary | ICD-10-CM | POA: Diagnosis not present

## 2021-04-16 DIAGNOSIS — D72819 Decreased white blood cell count, unspecified: Secondary | ICD-10-CM | POA: Diagnosis present

## 2021-04-16 DIAGNOSIS — Z7984 Long term (current) use of oral hypoglycemic drugs: Secondary | ICD-10-CM

## 2021-04-16 DIAGNOSIS — Z8616 Personal history of COVID-19: Secondary | ICD-10-CM

## 2021-04-16 DIAGNOSIS — Y92009 Unspecified place in unspecified non-institutional (private) residence as the place of occurrence of the external cause: Secondary | ICD-10-CM | POA: Diagnosis not present

## 2021-04-16 DIAGNOSIS — N8110 Cystocele, unspecified: Secondary | ICD-10-CM | POA: Diagnosis not present

## 2021-04-16 DIAGNOSIS — F319 Bipolar disorder, unspecified: Secondary | ICD-10-CM | POA: Diagnosis present

## 2021-04-16 DIAGNOSIS — E11649 Type 2 diabetes mellitus with hypoglycemia without coma: Secondary | ICD-10-CM | POA: Diagnosis not present

## 2021-04-16 DIAGNOSIS — Z7902 Long term (current) use of antithrombotics/antiplatelets: Secondary | ICD-10-CM | POA: Diagnosis not present

## 2021-04-16 DIAGNOSIS — S0990XA Unspecified injury of head, initial encounter: Secondary | ICD-10-CM | POA: Diagnosis not present

## 2021-04-16 DIAGNOSIS — I2510 Atherosclerotic heart disease of native coronary artery without angina pectoris: Secondary | ICD-10-CM | POA: Diagnosis present

## 2021-04-16 DIAGNOSIS — Z79899 Other long term (current) drug therapy: Secondary | ICD-10-CM

## 2021-04-16 DIAGNOSIS — T796XXS Traumatic ischemia of muscle, sequela: Secondary | ICD-10-CM | POA: Diagnosis not present

## 2021-04-16 DIAGNOSIS — Z803 Family history of malignant neoplasm of breast: Secondary | ICD-10-CM | POA: Diagnosis not present

## 2021-04-16 DIAGNOSIS — Z8673 Personal history of transient ischemic attack (TIA), and cerebral infarction without residual deficits: Secondary | ICD-10-CM

## 2021-04-16 DIAGNOSIS — Z794 Long term (current) use of insulin: Secondary | ICD-10-CM | POA: Diagnosis not present

## 2021-04-16 DIAGNOSIS — E7800 Pure hypercholesterolemia, unspecified: Secondary | ICD-10-CM | POA: Diagnosis present

## 2021-04-16 DIAGNOSIS — I639 Cerebral infarction, unspecified: Secondary | ICD-10-CM | POA: Diagnosis not present

## 2021-04-16 DIAGNOSIS — N858 Other specified noninflammatory disorders of uterus: Secondary | ICD-10-CM | POA: Diagnosis not present

## 2021-04-16 DIAGNOSIS — R531 Weakness: Secondary | ICD-10-CM | POA: Diagnosis not present

## 2021-04-16 DIAGNOSIS — N8500 Endometrial hyperplasia, unspecified: Secondary | ICD-10-CM | POA: Diagnosis not present

## 2021-04-16 DIAGNOSIS — E872 Acidosis: Secondary | ICD-10-CM | POA: Diagnosis present

## 2021-04-16 DIAGNOSIS — W19XXXA Unspecified fall, initial encounter: Secondary | ICD-10-CM | POA: Diagnosis present

## 2021-04-16 DIAGNOSIS — R9389 Abnormal findings on diagnostic imaging of other specified body structures: Secondary | ICD-10-CM

## 2021-04-16 DIAGNOSIS — N179 Acute kidney failure, unspecified: Secondary | ICD-10-CM | POA: Diagnosis present

## 2021-04-16 DIAGNOSIS — E785 Hyperlipidemia, unspecified: Secondary | ICD-10-CM

## 2021-04-16 DIAGNOSIS — I959 Hypotension, unspecified: Secondary | ICD-10-CM | POA: Diagnosis not present

## 2021-04-16 LAB — URINALYSIS, COMPLETE (UACMP) WITH MICROSCOPIC
Bilirubin Urine: NEGATIVE
Glucose, UA: NEGATIVE mg/dL
Ketones, ur: NEGATIVE mg/dL
Nitrite: POSITIVE — AB
Protein, ur: 100 mg/dL — AB
Specific Gravity, Urine: 1.008 (ref 1.005–1.030)
pH: 5 (ref 5.0–8.0)

## 2021-04-16 LAB — CBC WITH DIFFERENTIAL/PLATELET
Abs Immature Granulocytes: 0.01 10*3/uL (ref 0.00–0.07)
Basophils Absolute: 0 10*3/uL (ref 0.0–0.1)
Basophils Relative: 0 %
Eosinophils Absolute: 0 10*3/uL (ref 0.0–0.5)
Eosinophils Relative: 0 %
HCT: 36 % (ref 36.0–46.0)
Hemoglobin: 11.9 g/dL — ABNORMAL LOW (ref 12.0–15.0)
Immature Granulocytes: 0 %
Lymphocytes Relative: 15 %
Lymphs Abs: 0.5 10*3/uL — ABNORMAL LOW (ref 0.7–4.0)
MCH: 29.5 pg (ref 26.0–34.0)
MCHC: 33.1 g/dL (ref 30.0–36.0)
MCV: 89.3 fL (ref 80.0–100.0)
Monocytes Absolute: 0.2 10*3/uL (ref 0.1–1.0)
Monocytes Relative: 5 %
Neutro Abs: 2.9 10*3/uL (ref 1.7–7.7)
Neutrophils Relative %: 80 %
Platelets: 209 10*3/uL (ref 150–400)
RBC: 4.03 MIL/uL (ref 3.87–5.11)
RDW: 12.6 % (ref 11.5–15.5)
WBC: 3.6 10*3/uL — ABNORMAL LOW (ref 4.0–10.5)
nRBC: 0 % (ref 0.0–0.2)

## 2021-04-16 LAB — COMPREHENSIVE METABOLIC PANEL
ALT: 46 U/L — ABNORMAL HIGH (ref 0–44)
AST: 64 U/L — ABNORMAL HIGH (ref 15–41)
Albumin: 4.3 g/dL (ref 3.5–5.0)
Alkaline Phosphatase: 55 U/L (ref 38–126)
Anion gap: 10 (ref 5–15)
BUN: 18 mg/dL (ref 8–23)
CO2: 25 mmol/L (ref 22–32)
Calcium: 9.6 mg/dL (ref 8.9–10.3)
Chloride: 95 mmol/L — ABNORMAL LOW (ref 98–111)
Creatinine, Ser: 1.11 mg/dL — ABNORMAL HIGH (ref 0.44–1.00)
GFR, Estimated: 52 mL/min — ABNORMAL LOW (ref >60)
Glucose, Bld: 180 mg/dL — ABNORMAL HIGH (ref 70–99)
Potassium: 4.2 mmol/L (ref 3.5–5.1)
Sodium: 130 mmol/L — ABNORMAL LOW (ref 135–145)
Total Bilirubin: 0.9 mg/dL (ref 0.3–1.2)
Total Protein: 7.7 g/dL (ref 6.5–8.1)

## 2021-04-16 LAB — PREGNANCY, URINE: Preg Test, Ur: NEGATIVE

## 2021-04-16 LAB — GLUCOSE, CAPILLARY
Glucose-Capillary: 128 mg/dL — ABNORMAL HIGH (ref 70–99)
Glucose-Capillary: 69 mg/dL — ABNORMAL LOW (ref 70–99)

## 2021-04-16 LAB — LACTIC ACID, PLASMA: Lactic Acid, Venous: 2.3 mmol/L (ref 0.5–1.9)

## 2021-04-16 MED ORDER — ENOXAPARIN SODIUM 40 MG/0.4ML IJ SOSY
40.0000 mg | PREFILLED_SYRINGE | INTRAMUSCULAR | Status: DC
  Administered 2021-04-16: 40 mg via SUBCUTANEOUS
  Filled 2021-04-16: qty 0.4

## 2021-04-16 MED ORDER — ONDANSETRON HCL 4 MG PO TABS: 4.0000 mg | ORAL_TABLET | Freq: Four times a day (QID) | ORAL | Status: DC | PRN

## 2021-04-16 MED ORDER — SODIUM CHLORIDE 0.9 % IV SOLN: 1.0000 g | INTRAVENOUS | Status: DC

## 2021-04-16 MED ORDER — ONDANSETRON HCL 4 MG/2ML IJ SOLN: 4.0000 mg | Freq: Four times a day (QID) | INTRAMUSCULAR | Status: DC | PRN

## 2021-04-16 MED ORDER — ACETAMINOPHEN 325 MG PO TABS
650.0000 mg | ORAL_TABLET | Freq: Once | ORAL | Status: AC | PRN
  Administered 2021-04-16: 650 mg via ORAL
  Filled 2021-04-16: qty 2

## 2021-04-16 MED ORDER — SODIUM CHLORIDE 0.9 % IV BOLUS
500.0000 mL | Freq: Once | INTRAVENOUS | Status: AC
  Administered 2021-04-16: 500 mL via INTRAVENOUS

## 2021-04-16 MED ORDER — INSULIN ASPART 100 UNIT/ML IJ SOLN
0.0000 [IU] | Freq: Three times a day (TID) | INTRAMUSCULAR | Status: DC
  Administered 2021-04-16 – 2021-04-18 (×4): 2 [IU] via SUBCUTANEOUS
  Administered 2021-04-18: 09:00:00 3 [IU] via SUBCUTANEOUS
  Administered 2021-04-18 – 2021-04-19 (×2): 2 [IU] via SUBCUTANEOUS
  Administered 2021-04-19: 3 [IU] via SUBCUTANEOUS
  Filled 2021-04-16 (×8): qty 1

## 2021-04-16 MED ORDER — ACETAMINOPHEN 650 MG RE SUPP: 650.0000 mg | Freq: Four times a day (QID) | RECTAL | Status: DC | PRN

## 2021-04-16 MED ORDER — LACTATED RINGERS IV SOLN: INTRAVENOUS | Status: DC

## 2021-04-16 MED ORDER — ROSUVASTATIN CALCIUM 20 MG PO TABS
20.0000 mg | ORAL_TABLET | Freq: Every day | ORAL | Status: DC
  Administered 2021-04-16: 20 mg via ORAL
  Filled 2021-04-16 (×2): qty 1

## 2021-04-16 MED ORDER — ASPIRIN EC 81 MG PO TBEC
81.0000 mg | DELAYED_RELEASE_TABLET | Freq: Every day | ORAL | Status: DC
  Administered 2021-04-17 – 2021-04-19 (×3): 81 mg via ORAL
  Filled 2021-04-16 (×3): qty 1

## 2021-04-16 MED ORDER — SODIUM CHLORIDE 0.9 % IV SOLN
1.0000 g | INTRAVENOUS | Status: DC
  Administered 2021-04-16 – 2021-04-18 (×3): 1 g via INTRAVENOUS
  Filled 2021-04-16: qty 1
  Filled 2021-04-16 (×3): qty 10

## 2021-04-16 MED ORDER — ACETAMINOPHEN 325 MG PO TABS
650.0000 mg | ORAL_TABLET | Freq: Four times a day (QID) | ORAL | Status: DC | PRN
  Administered 2021-04-16 – 2021-04-19 (×7): 650 mg via ORAL
  Filled 2021-04-16 (×7): qty 2

## 2021-04-16 NOTE — ED Notes (Signed)
Patient transported to Ultrasound. Daughter at bedside updated on POC and inpatient bed assignment.

## 2021-04-16 NOTE — ED Triage Notes (Signed)
Pt comes into the ED via POV with family c/o generalized weakness and fall.  Family and pt deny hitting their head on anything.  Pt denies any CP, SHOB, or dizziness.  PT in NAD at this time with even and unlabored respirations.

## 2021-04-16 NOTE — ED Provider Notes (Signed)
[facility] Emergency Department Provider Note    Event Date/Time   First MD Initiated Contact with Patient 04/16/21 1149     (approximate)  I have reviewed the triage vital signs and the nursing notes.   HISTORY  Chief Complaint Fall and Weakness    HPI Sabrina [name] is a 75 y.o. female extensive past medical history as listed below presents to the ER for generalized weakness.  Probably did have a fall secondary to generalized weakness.  Did not strike her head.  Denies any numbness or tingling.  Denies any chest pain or shortness of breath.  Does have mild right-sided right lower quadrant abdominal pain.  Has some right hip pain but able to fully range hip and leg.  Denies any new rashes.  No cough or congestion.  Was sick with COVID 3 months ago.  States that she does not want to be tested for Covid.  Past Medical History:  Diagnosis Date   Diabetes mellitus without complication (HCC)    High cholesterol    Hypertension    Family History  Problem Relation Age of Onset   Breast cancer Maternal Aunt        2 aunts in their 71's   Breast cancer Maternal Aunt 70   Ovarian cancer Neg Hx    Colon cancer Neg Hx    Past Surgical History:  Procedure Laterality Date   DILATION AND CURETTAGE OF UTERUS     POLYPECTOMY     Patient Active Problem List   Diagnosis Date Noted   Malnutrition of moderate degree 12/10/2020   COVID-19 virus infection 12/09/2020   AKI (acute kidney injury) ([place]) 12/08/2020   Acute CVA (cerebrovascular accident) ([place]) 01/31/2020   Episode of transient neurologic symptoms 01/30/2020   Hyperglycemia due to type 2 diabetes mellitus ([place]) 01/30/2020   Incomplete uterine prolapse 12/08/2017   Cystocele, midline 12/08/2017   Bipolar affective disorder, manic, severe, with psychotic behavior ([place])    Type 2 diabetes mellitus ([place]) 04/01/2016   Dyslipidemia 04/01/2016   Brief psychotic disorder ([place]) 03/28/2016   Noncompliance  03/28/2016   Essential hypertension 03/09/2016      Prior to Admission medications   Medication Sig Start Date End Date Taking? Authorizing Provider  amLODipine (NORVASC) 5 MG tablet Take 1 tablet (5 mg total) by mouth daily. 02/01/20 01/31/21  Danford, Suann Larry, MD  ASPIRIN LOW DOSE 81 MG EC tablet Take 81 mg by mouth daily. 07/10/20   [provider]  benazepril (LOTENSIN) 20 MG tablet Take 20 mg by mouth 2 (two) times daily.  11/10/19   [provider]  clopidogrel (PLAVIX) 75 MG tablet Take 75 mg by mouth daily. 10/30/20   [provider]  isosorbide mononitrate (IMDUR) 60 MG 24 hr tablet Take 60 mg by mouth 2 (two) times daily.  01/22/20   [provider]  LEVEMIR FLEXTOUCH 100 UNIT/ML FlexPen Inject 20 Units into the skin at bedtime. 09/30/20   [provider]  metFORMIN (GLUCOPHAGE) 500 MG tablet Take 1,000 mg by mouth 2 (two) times daily. 09/30/20   [provider]  rosuvastatin (CRESTOR) 40 MG tablet Take 0.5 tablets (20 mg total) by mouth at bedtime. 02/01/20   Danford, Suann Larry, MD  spironolactone (ALDACTONE) 25 MG tablet Take 25 mg by mouth daily. 02/18/16   [provider]    Allergies Glimepiride, Glipizide, Lisinopril, Losartan, Norco [hydrocodone-acetaminophen], and Pioglitazone    Social History Social History   Tobacco Use  Smoking status: Never   Smokeless tobacco: Never  Vaping Use   Vaping Use: Never used  Substance Use Topics   Alcohol use: No   Drug use: No    Review of Systems Patient denies headaches, rhinorrhea, blurry vision, numbness, shortness of breath, chest pain, edema, cough, abdominal pain, nausea, vomiting, diarrhea, dysuria, fevers, rashes or hallucinations unless otherwise stated above in HPI. ____________________________________________   PHYSICAL EXAM:  VITAL SIGNS: Vitals:   04/16/21 1430 04/16/21 1500  BP: (!) 99/58 (!) 102/55  Pulse: 85 83  Resp:    Temp:    SpO2:  98% 99%    Constitutional: Alert and oriented.  Eyes: Conjunctivae are normal.  Head: Atraumatic. Nose: No congestion/rhinnorhea. Mouth/Throat: Mucous membranes are moist.   Neck: No stridor. Painless ROM.  Cardiovascular: Normal rate, regular rhythm. Grossly normal heart sounds.  Good peripheral circulation. Respiratory: Normal respiratory effort.  No retractions. Lungs CTAB. Gastrointestinal: Soft and nontender in all four quadrants.  No distention. No abdominal bruits. No CVA tenderness. Genitourinary:  Musculoskeletal: No lower extremity tenderness nor edema.  Full painless rom of bilat hip and knees.  No joint effusions. Neurologic:  Normal speech and language. No gross focal neurologic deficits are appreciated. No facial droop Skin:  Skin is warm, dry and intact. No rash noted. Psychiatric: Mood and affect are normal. Speech and behavior are normal.  ____________________________________________   LABS (all labs ordered are listed, but only abnormal results are displayed)  Results for orders placed or performed during the hospital encounter of 04/16/21 (from the past 24 hour(s))  Lactic acid, plasma     Status: Abnormal   Collection Time: 04/16/21 11:40 AM  Result Value Ref Range   Lactic Acid, Venous 2.3 (HH) 0.5 - 1.9 mmol/L  Comprehensive metabolic panel     Status: Abnormal   Collection Time: 04/16/21 11:40 AM  Result Value Ref Range   Sodium 130 (L) 135 - 145 mmol/L   Potassium 4.2 3.5 - 5.1 mmol/L   Chloride 95 (L) 98 - 111 mmol/L   CO2 25 22 - 32 mmol/L   Glucose, Bld 180 (H) 70 - 99 mg/dL   BUN 18 8 - 23 mg/dL   Creatinine, Ser 1.11 (H) 0.44 - 1.00 mg/dL   Calcium 9.6 8.9 - 10.3 mg/dL   Total Protein 7.7 6.5 - 8.1 g/dL   Albumin 4.3 3.5 - 5.0 g/dL   AST 64 (H) 15 - 41 U/L   ALT 46 (H) 0 - 44 U/L   Alkaline Phosphatase 55 38 - 126 U/L   Total Bilirubin 0.9 0.3 - 1.2 mg/dL   GFR, Estimated 52 (L) >60 mL/min   Anion gap 10 5 - 15  CBC with Differential      Status: Abnormal   Collection Time: 04/16/21 11:40 AM  Result Value Ref Range   WBC 3.6 (L) 4.0 - 10.5 K/uL   RBC 4.03 3.87 - 5.11 MIL/uL   Hemoglobin 11.9 (L) 12.0 - 15.0 g/dL   HCT 36.0 36.0 - 46.0 %   MCV 89.3 80.0 - 100.0 fL   MCH 29.5 26.0 - 34.0 pg   MCHC 33.1 30.0 - 36.0 g/dL   RDW 12.6 11.5 - 15.5 %   Platelets 209 150 - 400 K/uL   nRBC 0.0 0.0 - 0.2 %   Neutrophils Relative % 80 %   Neutro Abs 2.9 1.7 - 7.7 K/uL   Lymphocytes Relative 15 %   Lymphs Abs 0.5 (L) 0.7 - 4.0 K/uL  Monocytes Relative 5 %   Monocytes Absolute 0.2 0.1 - 1.0 K/uL   Eosinophils Relative 0 %   Eosinophils Absolute 0.0 0.0 - 0.5 K/uL   Basophils Relative 0 %   Basophils Absolute 0.0 0.0 - 0.1 K/uL   Immature Granulocytes 0 %   Abs Immature Granulocytes 0.01 0.00 - 0.07 K/uL  Urinalysis, Complete w Microscopic     Status: Abnormal   Collection Time: 04/16/21  3:15 PM  Result Value Ref Range   Color, Urine AMBER (A) YELLOW   APPearance CLOUDY (A) CLEAR   Specific Gravity, Urine 1.008 1.005 - 1.030   pH 5.0 5.0 - 8.0   Glucose, UA NEGATIVE NEGATIVE mg/dL   Hgb urine dipstick MODERATE (A) NEGATIVE   Bilirubin Urine NEGATIVE NEGATIVE   Ketones, ur NEGATIVE NEGATIVE mg/dL   Protein, ur 100 (A) NEGATIVE mg/dL   Nitrite POSITIVE (A) NEGATIVE   Leukocytes,Ua SMALL (A) NEGATIVE   RBC / HPF 0-5 0 - 5 RBC/hpf   WBC, UA 21-50 0 - 5 WBC/hpf   Bacteria, UA MANY (A) NONE SEEN   Squamous Epithelial / LPF 0-5 0 - 5   WBC Clumps PRESENT    Mucus PRESENT    ____________________________________________  EKG My review and personal interpretation at Time: 11:41   Indication: weakness  Rate: 107  Rhythm: sinus Axis: normal Other: no stemi, nonspecific st abn,  ____________________________________________  RADIOLOGY  I personally reviewed all radiographic images ordered to evaluate for the above acute complaints and reviewed radiology reports and findings.  These findings were personally discussed with  the patient.  Please see medical record for radiology report.  ____________________________________________   PROCEDURES  Procedure(s) performed:  Procedures    Critical Care performed: no ____________________________________________   INITIAL IMPRESSION / ASSESSMENT AND PLAN / ED COURSE  Pertinent labs & imaging results that were available during my care of the patient were reviewed by me and considered in my medical decision making (see chart for details).   DDX: sepsis, uti, pna, covid, dehydration, electrolyte abn  Lajean L Mirza is a 75 y.o. who presents to the ED with weakness symptoms as described above.  Patient febrile mildly tachycardic but otherwise well perfused.  Does appear frail.  Will give IV fluids.  Blood work was sent for the but differential.  CT imaging ordered for above differential.  Urinalysis does show evidence of UTI.  She is given IV Rocephin.  Lactate mildly elevated.  Given IV fluids for resuscitation.  No sign of pneumonia.  Will discuss with hospitalist for admission     The patient was evaluated in Emergency Department today for the symptoms described in the history of present illness. He/she was evaluated in the context of the global COVID-19 pandemic, which necessitated consideration that the patient might be at risk for infection with the SARS-CoV-2 virus that causes COVID-19. Institutional protocols and algorithms that pertain to the evaluation of patients at risk for COVID-19 are in a state of rapid change based on information released by regulatory bodies including the CDC and federal and state organizations. These policies and algorithms were followed during the patient's care in the ED.  As part of my medical decision making, I reviewed the following data within the [employer] notes reviewed and incorporated, Labs reviewed, notes from prior ED visits and [place] Controlled Substance  Database   ____________________________________________   FINAL CLINICAL IMPRESSION(S) / ED DIAGNOSES  Final diagnoses:  Weakness  Sepsis without acute organ dysfunction,  due to unspecified organism [facility])  Lower urinary tract infectious disease      NEW MEDICATIONS STARTED DURING THIS VISIT:  New Prescriptions   No medications on file     Note:  This document was prepared using Dragon voice recognition software and may include unintentional dictation errors.    Merlyn Lot, MD 04/16/21 [phone]

## 2021-04-16 NOTE — H&P (Signed)
History and Physical    Sabrina [name] VZS:[id number] DOB: April 01, 1946 DOA: 04/16/2021  PCP: [name], MD   Patient coming from: Home  I have personally briefly reviewed patient's old medical records in [place]  Chief Complaint: Weakness  HPI: Sabrina [name] is a 75 y.o. female with medical history significant for diabetes and hypertension, as well as anxiety with history of a brief psychiatric admission in 2017 who presents to the ER via private vehicle for evaluation of generalized weakness and a fall last night prior to her admission.  Patient was walking back to her bedroom from the bathroom when she felt very weak and slid to the floor landing on her knees.  She denied hitting her head and denies any loss of consciousness.  She states that she has not felt well and her appetite has been poor. She complains of pain in her right hip. She has had frequency of urination but denies having any dysuria or hematuria.  She denies having any chest pain, no cough, no abdominal pain, no changes in her bowel habits, no headache, no dizziness, no lightheadedness. Labs show sodium 130, potassium 4.2, chloride 95, bicarb 25, glucose 180, BUN 18, creatinine 1.1, calcium 9.6, magnesium 2.1, alkaline phosphatase 55, albumin 4.3, AST 64, ALT 46, total protein 7.7, total bilirubin 0.9, lactic acid 2.3, white count 3.6, hemoglobin 11.9, hematocrit 36, MCV 89.3, RDW 12.6, platelet count 209 Chest x-ray reviewed by me shows no acute findings CT scan of the head without contrast shows no acute findings CT scan of abdomen and pelvis shows abnormal appearance of the uterus with prominent fluid distension of the endometrial cavity and areas of surface nodularity. Findings are concerning for endometrial malignancy. Further evaluation with pelvic ultrasound is recommended.Pelvic floor laxity with mild cystocele. Aortic and coronary artery atherosclerosis  Twelve-lead EKG shows sinus tachycardia with low  voltage QRS Patient had a positive SARS Coronavirus 2 POCT in 02/22 and refuses further testing at this time     ED Course: Patient is a 75 year old female who presents to the ER via private vehicle for evaluation of weakness and and a fall at home.  She was noted to be hypotensive with systolic blood pressure in the 90s when she arrived to ER.  She had a low-grade fever with a T-max of 100.8 and lactic acid was elevated at 2.3. Patient noted to have pyuria and received IV Rocephin as well as IV fluids. She will be admitted to the hospital for further evaluation.   Review of Systems: As per HPI otherwise all other systems reviewed and negative.    Past Medical History:  Diagnosis Date   Diabetes mellitus without complication (HCC)    High cholesterol    Hypertension     Past Surgical History:  Procedure Laterality Date   DILATION AND CURETTAGE OF UTERUS     POLYPECTOMY       reports that she has never smoked. She has never used smokeless tobacco. She reports that she does not drink alcohol and does not use drugs.  Allergies  Allergen Reactions   Glimepiride Diarrhea and Other (See Comments)   Glipizide Other (See Comments)   Lisinopril Other (See Comments)    headache   Losartan Other (See Comments)    Headache   Norco [Hydrocodone-Acetaminophen] Other (See Comments)    Mood changes   Pioglitazone Swelling    On legs    Family History  Problem Relation Age of Onset   Breast  cancer Maternal Aunt        2 aunts in their 66's   Breast cancer Maternal Aunt 70   Ovarian cancer Neg Hx    Colon cancer Neg Hx       Prior to Admission medications   Medication Sig Start Date End Date Taking? Authorizing Provider  amLODipine (NORVASC) 5 MG tablet Take 1 tablet (5 mg total) by mouth daily. 02/01/20 01/31/21  Danford, Suann Larry, MD  ASPIRIN LOW DOSE 81 MG EC tablet Take 81 mg by mouth daily. 07/10/20   [provider]  benazepril (LOTENSIN) 20 MG tablet Take 20 mg  by mouth 2 (two) times daily.  11/10/19   [provider]  clopidogrel (PLAVIX) 75 MG tablet Take 75 mg by mouth daily. 10/30/20   [provider]  isosorbide mononitrate (IMDUR) 60 MG 24 hr tablet Take 60 mg by mouth 2 (two) times daily.  01/22/20   [provider]  LEVEMIR FLEXTOUCH 100 UNIT/ML FlexPen Inject 20 Units into the skin at bedtime. 09/30/20   [provider]  metFORMIN (GLUCOPHAGE) 500 MG tablet Take 1,000 mg by mouth 2 (two) times daily. 09/30/20   [provider]  rosuvastatin (CRESTOR) 40 MG tablet Take 0.5 tablets (20 mg total) by mouth at bedtime. 02/01/20   Danford, Suann Larry, MD  spironolactone (ALDACTONE) 25 MG tablet Take 25 mg by mouth daily. 02/18/16   [provider]    Physical Exam: Vitals:   04/16/21 1423 04/16/21 1430 04/16/21 1500 04/16/21 1530  BP:  (!) 99/58 (!) 102/55 (!) 104/53  Pulse: 87 85 83 80  Resp:      Temp:      TempSrc:      SpO2: 98% 98% 99% 99%  Weight:      Height:         Vitals:   04/16/21 1423 04/16/21 1430 04/16/21 1500 04/16/21 1530  BP:  (!) 99/58 (!) 102/55 (!) 104/53  Pulse: 87 85 83 80  Resp:      Temp:      TempSrc:      SpO2: 98% 98% 99% 99%  Weight:      Height:          Constitutional: Alert and oriented x 3 . Not in any apparent distress HEENT:      Head: Normocephalic and atraumatic.         Eyes: PERLA, EOMI, Conjunctivae are normal. Sclera is non-icteric.       Mouth/Throat: Mucous membranes are moist.       Neck: Supple with no signs of meningismus. Cardiovascular: Regular rate and rhythm. No murmurs, gallops, or rubs. 2+ symmetrical distal pulses are present . No JVD. No LE edema Respiratory: Respiratory effort normal .Lungs sounds clear bilaterally. No wheezes, crackles, or rhonchi.  Gastrointestinal: Soft, non tender, and non distended with positive bowel sounds.  Genitourinary: No CVA tenderness. Musculoskeletal: Nontender with normal range of motion in  all extremities. No cyanosis, or erythema of extremities. Neurologic:  Face is symmetric. Moving all extremities. No gross focal neurologic deficits . Skin: Skin is warm, dry.  No rash or ulcers Psychiatric: Mood and affect are normal    Labs on Admission: I have personally reviewed following labs and imaging studies  CBC: Recent Labs  Lab 04/16/21 1140  WBC 3.6*  NEUTROABS 2.9  HGB 11.9*  HCT 36.0  MCV 89.3  PLT 893   Basic Metabolic Panel: Recent Labs  Lab 04/16/21 1140  NA  130*  K 4.2  CL 95*  CO2 25  GLUCOSE 180*  BUN 18  CREATININE 1.11*  CALCIUM 9.6   GFR: Estimated Creatinine Clearance: 35.2 mL/min (A) (by C-G formula based on SCr of 1.11 mg/dL (H)). Liver Function Tests: Recent Labs  Lab 04/16/21 1140  AST 64*  ALT 46*  ALKPHOS 55  BILITOT 0.9  PROT 7.7  ALBUMIN 4.3   No results for input(s): LIPASE, AMYLASE in the last 168 hours. No results for input(s): AMMONIA in the last 168 hours. Coagulation Profile: No results for input(s): INR, PROTIME in the last 168 hours. Cardiac Enzymes: No results for input(s): CKTOTAL, CKMB, CKMBINDEX, TROPONINI in the last 168 hours. BNP (last 3 results) No results for input(s): PROBNP in the last 8760 hours. HbA1C: No results for input(s): HGBA1C in the last 72 hours. CBG: No results for input(s): GLUCAP in the last 168 hours. Lipid Profile: No results for input(s): CHOL, HDL, LDLCALC, TRIG, CHOLHDL, LDLDIRECT in the last 72 hours. Thyroid Function Tests: No results for input(s): TSH, T4TOTAL, FREET4, T3FREE, THYROIDAB in the last 72 hours. Anemia Panel: No results for input(s): VITAMINB12, FOLATE, FERRITIN, TIBC, IRON, RETICCTPCT in the last 72 hours. Urine analysis:    Component Value Date/Time   COLORURINE AMBER (A) 04/16/2021 1515   APPEARANCEUR CLOUDY (A) 04/16/2021 1515   LABSPEC 1.008 04/16/2021 1515   PHURINE 5.0 04/16/2021 1515   GLUCOSEU NEGATIVE 04/16/2021 1515   HGBUR MODERATE (A) 04/16/2021  1515   BILIRUBINUR NEGATIVE 04/16/2021 1515   KETONESUR NEGATIVE 04/16/2021 1515   PROTEINUR 100 (A) 04/16/2021 1515   NITRITE POSITIVE (A) 04/16/2021 1515   LEUKOCYTESUR SMALL (A) 04/16/2021 1515    Radiological Exams on Admission: CT ABDOMEN PELVIS WO CONTRAST  Result Date: 04/16/2021 CLINICAL DATA:  Abdominal pain, fever, generalized weakness EXAM: CT ABDOMEN AND PELVIS WITHOUT CONTRAST TECHNIQUE: Multidetector CT imaging of the abdomen and pelvis was performed following the standard protocol without IV contrast. COMPARISON:  CT 06/16/2019, 04/17/2010 FINDINGS: Lower chest: Included lung bases are clear. Extensive coronary artery atherosclerosis. Hepatobiliary: Unremarkable unenhanced appearance of the liver. No focal liver lesion identified. Gallbladder within normal limits. No hyperdense gallstone. No biliary dilatation. Pancreas: Unremarkable. No pancreatic ductal dilatation or surrounding inflammatory changes. Spleen: Normal in size without focal abnormality. Adrenals/Urinary Tract: Adrenal glands are unremarkable. Kidneys are normal, without renal calculi, focal lesion, or hydronephrosis. Urinary bladder is within normal limits for the degree of distension. Pelvic floor laxity with mild cystocele. Stomach/Bowel: Stomach is within normal limits. Appendix appears normal (series 2, image 51). No evidence of bowel wall thickening, distention, or inflammatory changes. Vascular/Lymphatic: Aortic atherosclerosis. No enlarged abdominal or pelvic lymph nodes. Small right external iliac chain lymph node is unchanged in size from prior. Reproductive: Abnormal appearance of the uterus with prominent fluid distension of the endometrial cavity and areas of surface nodularity (series 2, images 69-73). Probable exophytic posterior uterine body fibroid appears unchanged from prior. No adnexal abnormality. Other: No free fluid. No abdominopelvic fluid collection. No pneumoperitoneum. No abdominal wall hernia. Well  circumscribed 2.1 cm peripherally calcified lesion anterior to the liver is unchanged compared to previous CT from 2011, benign. Musculoskeletal: No acute or significant osseous findings. Moderate arthropathy of the right hip joint. Facet predominant degenerative changes within the lumbar spine. No acute osseous abnormality. No suspicious bone lesion. IMPRESSION: 1. Abnormal appearance of the uterus with prominent fluid distension of the endometrial cavity and areas of surface nodularity. Findings are concerning for endometrial malignancy. Further evaluation  with pelvic ultrasound is recommended. 2. Pelvic floor laxity with mild cystocele. 3. Aortic and coronary artery atherosclerosis (ICD10-I70.0). Electronically Signed   By: Davina Poke D.O.   On: 04/16/2021 13:50   DG Chest 2 View  Result Date: 04/16/2021 CLINICAL DATA:  Generalized weakness for 2 days. EXAM: CHEST - 2 VIEW COMPARISON:  Chest CT 12/09/2020 and chest radiograph 12/08/2020. FINDINGS: Trachea is midline. Heart size stable. Lungs are clear. No pleural fluid. IMPRESSION: No acute findings. Electronically Signed   By: Lorin Picket M.D.   On: 04/16/2021 13:05   CT Head Wo Contrast  Result Date: 04/16/2021 CLINICAL DATA:  Head trauma EXAM: CT HEAD WITHOUT CONTRAST TECHNIQUE: Contiguous axial images were obtained from the base of the skull through the vertex without intravenous contrast. COMPARISON:  April 2021 FINDINGS: Brain: There is no acute intracranial hemorrhage, mass effect, or edema. Gray-white differentiation is preserved. There is no extra-axial fluid collection. Patchy and confluent low-attenuation in the supratentorial white matter is nonspecific but may reflect similar moderate chronic microvascular ischemic changes. Small chronic infarct of the right thalamus. Prominence of the ventricles and sulci reflects similar parenchymal volume loss. Vascular: There is atherosclerotic calcification at the skull base. Skull: Calvarium  is unremarkable. Sinuses/Orbits: No acute finding. Other: None. IMPRESSION: No evidence of acute intracranial injury. Chronic/nonemergent findings detailed above. Electronically Signed   By: Macy Mis M.D.   On: 04/16/2021 13:45     Assessment/Plan Principal Problem:   Sepsis ([place]) Active Problems:   Essential hypertension   Type 2 diabetes mellitus ([place])     Sepsis from a urinary source (POA) As evidenced by fever with a low-grade temp of 100.18F, tachycardia and lactic acidosis as well as pyuria. Patient had relative hypotension with systolic blood pressure in the 90s upon arrival to the ER and received IV fluid resuscitation Prior urine culture yielded Klebsiella pneumonia sensitive to cephalosporins Continue aggressive IV fluid resuscitation Will place patient empirically on Rocephin 1 g IV daily until urine culture results become available.     Hypertension Hold amlodipine, benazepril and nitrates due to relative hypotension    Diabetes mellitus Maintain consistent carbohydrate diet Glycemic control with sliding scale insulin     History of TIA/CVA Continue high intensity statins, aspirin and Plavix.    Weakness/fall Will place patient on fall precautions Will request PT evaluation     DVT prophylaxis: Lovenox  Code Status: full code  Family Communication: Greater than 50% of time was spent discussing patient's condition and plan of care with her and her daughter at the bedside.  All questions and concerns have been addressed.  They verbalized understanding and agree with the plan. Disposition Plan: Back to previous home environment Consults called: Physical therapy Status: At the time of admission, it appears that the appropriate admission status for this patient is inpatient. This is judged to be reasonable and necessary in order to provide the required intensity of service to ensure the patient's safety given the presenting symptoms, physical exam  findings, and initial radiographic and laboratory data in the context of their comorbid conditions Patient requires inpatient status due to high intensity of service, high risk for further deterioration and high frequency of surveillance required.    Collier Bullock MD [facility]     04/16/2021, 4:41 PM

## 2021-04-16 NOTE — ED Triage Notes (Signed)
See prior triage note. Pt reports coming to ED for generalized weakness since Monday, states she has not had a fall, her right hip has just been hurting her make it hard to walk, c/o pain since march.  Pt alert and oriented, clear speech, able to move all extremities  Pt reports she is allergic to hydrocodone, but takes tylenol at home, tylenol ordered for fever

## 2021-04-16 NOTE — ED Notes (Signed)
Pt did not want in and out catheter. Purewick placed. Pt tolerated well.

## 2021-04-17 ENCOUNTER — Encounter: Payer: Self-pay | Admitting: Internal Medicine

## 2021-04-17 DIAGNOSIS — R531 Weakness: Secondary | ICD-10-CM

## 2021-04-17 DIAGNOSIS — R9389 Abnormal findings on diagnostic imaging of other specified body structures: Secondary | ICD-10-CM

## 2021-04-17 DIAGNOSIS — E11649 Type 2 diabetes mellitus with hypoglycemia without coma: Secondary | ICD-10-CM

## 2021-04-17 DIAGNOSIS — E785 Hyperlipidemia, unspecified: Secondary | ICD-10-CM

## 2021-04-17 DIAGNOSIS — I959 Hypotension, unspecified: Secondary | ICD-10-CM

## 2021-04-17 DIAGNOSIS — N3001 Acute cystitis with hematuria: Secondary | ICD-10-CM

## 2021-04-17 LAB — CBC
HCT: 33.9 % — ABNORMAL LOW (ref 36.0–46.0)
Hemoglobin: 11.5 g/dL — ABNORMAL LOW (ref 12.0–15.0)
MCH: 30.3 pg (ref 26.0–34.0)
MCHC: 33.9 g/dL (ref 30.0–36.0)
MCV: 89.4 fL (ref 80.0–100.0)
Platelets: 179 10*3/uL (ref 150–400)
RBC: 3.79 MIL/uL — ABNORMAL LOW (ref 3.87–5.11)
RDW: 12.4 % (ref 11.5–15.5)
WBC: 3.1 10*3/uL — ABNORMAL LOW (ref 4.0–10.5)
nRBC: 0 % (ref 0.0–0.2)

## 2021-04-17 LAB — GLUCOSE, CAPILLARY
Glucose-Capillary: 114 mg/dL — ABNORMAL HIGH (ref 70–99)
Glucose-Capillary: 137 mg/dL — ABNORMAL HIGH (ref 70–99)
Glucose-Capillary: 140 mg/dL — ABNORMAL HIGH (ref 70–99)
Glucose-Capillary: 113 mg/dL — ABNORMAL HIGH (ref 70–99)
Glucose-Capillary: 140 mg/dL — ABNORMAL HIGH (ref 70–99)

## 2021-04-17 LAB — PROTIME-INR
INR: 1.2 (ref 0.8–1.2)
Prothrombin Time: 14.7 seconds (ref 11.4–15.2)

## 2021-04-17 LAB — BASIC METABOLIC PANEL
Anion gap: 8 (ref 5–15)
BUN: 15 mg/dL (ref 8–23)
CO2: 27 mmol/L (ref 22–32)
Calcium: 9.3 mg/dL (ref 8.9–10.3)
Chloride: 99 mmol/L (ref 98–111)
Creatinine, Ser: 0.85 mg/dL (ref 0.44–1.00)
GFR, Estimated: >60 mL/min (ref >60)
Glucose, Bld: 156 mg/dL — ABNORMAL HIGH (ref 70–99)
Potassium: 4 mmol/L (ref 3.5–5.1)
Sodium: 134 mmol/L — ABNORMAL LOW (ref 135–145)

## 2021-04-17 LAB — HEMOGLOBIN A1C
Hgb A1c MFr Bld: 7.1 % — ABNORMAL HIGH (ref 4.8–5.6)
Mean Plasma Glucose: 157.07 mg/dL

## 2021-04-17 LAB — CORTISOL-AM, BLOOD: Cortisol - AM: 26.7 ug/dL — ABNORMAL HIGH (ref 6.7–22.6)

## 2021-04-17 LAB — PROCALCITONIN: Procalcitonin: 0.35 ng/mL

## 2021-04-17 LAB — CK: Total CK: 583 U/L — ABNORMAL HIGH (ref 38–234)

## 2021-04-17 NOTE — Progress Notes (Signed)
Patient ID: Sabrina [name], female   DOB: 11/17/1945, 75 y.o.   MRN: [id number] [facility] PROGRESS NOTE  Sabrina [name] [id number]:[id number] DOB: [birth year] DOA: 04/16/2021 PCP: [name], MD  HPI/Subjective: The patient's major complaint is weakness.  Patient offers no other complaints.  Patient's daughter stated that she had a fall and ended up on the ground and was on the ground for an unknown period of time.  Patient had a fever earlier this morning.  No pain.  Blood pressure low in the emergency room.  Admitted with sepsis.  Objective: Vitals:   04/17/21 0812 04/17/21 1214  BP: (!) 119/55 (!) 111/54  Pulse: 84 72  Resp: 18 18  Temp: 100 F (37.8 C) 98.6 F (37 C)  SpO2: 97% 100%    Intake/Output Summary (Last 24 hours) at 04/17/2021 1520 Last data filed at 04/17/2021 1500 Gross per 24 hour  Intake 3274.3 ml  Output 3600 ml  Net -325.7 ml   Filed Weights   04/16/21 1136  Weight: 58.1 kg    ROS: Review of Systems  Respiratory:  Negative for shortness of breath.   Cardiovascular:  Negative for chest pain.  Gastrointestinal:  Negative for abdominal pain, nausea and vomiting.  Musculoskeletal:  Negative for joint pain.  Exam: Physical Exam HENT:     Head: Normocephalic.     Mouth/Throat:     Pharynx: No oropharyngeal exudate.  Eyes:     General: Lids are normal.     Conjunctiva/sclera: Conjunctivae normal.  Cardiovascular:     Rate and Rhythm: Normal rate and regular rhythm.     Heart sounds: Normal heart sounds, S1 normal and S2 normal.  Pulmonary:     Breath sounds: Normal breath sounds. No decreased breath sounds, wheezing, rhonchi or rales.  Abdominal:     Palpations: Abdomen is soft.     Tenderness: There is no abdominal tenderness.  Musculoskeletal:     Right lower leg: No swelling.     Left lower leg: No swelling.  Skin:    General: Skin is warm.     Findings: No rash.  Neurological:     Mental Status: She is alert and oriented to person,  place, and time.     Comments: Able to straight leg raise bilaterally.     Data Reviewed: Basic Metabolic Panel: Recent Labs  Lab 04/16/21 1140 04/17/21 0512  NA 130* 134*  K 4.2 4.0  CL 95* 99  CO2 25 27  GLUCOSE 180* 156*  BUN 18 15  CREATININE 1.11* 0.85  CALCIUM 9.6 9.3   Liver Function Tests: Recent Labs  Lab 04/16/21 1140  AST 64*  ALT 46*  ALKPHOS 55  BILITOT 0.9  PROT 7.7  ALBUMIN 4.3   CBC: Recent Labs  Lab 04/16/21 1140 04/17/21 0512  WBC 3.6* 3.1*  NEUTROABS 2.9  --   HGB 11.9* 11.5*  HCT 36.0 33.9*  MCV 89.3 89.4  PLT 209 179    BNP (last 3 results) Recent Labs    12/08/20 2144  BNP 34.8     CBG: Recent Labs  Lab 04/16/21 1809 04/16/21 2122 04/17/21 0455 04/17/21 0809 04/17/21 1145  GLUCAP 128* 69* 114* 137* 140*    Recent Results (from the past 240 hour(s))  Blood culture (routine x 2)     Status: None (Preliminary result)   Collection Time: 04/16/21 12:56 PM   Specimen: BLOOD  Result Value Ref Range Status   Specimen Description BLOOD BLOOD RIGHT FOREARM  Final   Special Requests   Final    BOTTLES DRAWN AEROBIC AND ANAEROBIC Blood Culture results may not be optimal due to an excessive volume of blood received in culture bottles   Culture   Final    NO GROWTH < 24 HOURS Performed at [facility], [address], [place], [place] [address]    Report Status PENDING  Incomplete  Blood culture (routine x 2)     Status: None (Preliminary result)   Collection Time: 04/16/21 12:56 PM   Specimen: BLOOD  Result Value Ref Range Status   Specimen Description BLOOD BLOOD LEFT FOREARM  Final   Special Requests   Final    BOTTLES DRAWN AEROBIC AND ANAEROBIC Blood Culture results may not be optimal due to an excessive volume of blood received in culture bottles   Culture   Final    NO GROWTH < 24 HOURS Performed at [facility], [address], [place], [place] [address]    Report Status PENDING  Incomplete      Studies: CT ABDOMEN PELVIS WO CONTRAST  Result Date: 04/16/2021 CLINICAL DATA:  Abdominal pain, fever, generalized weakness EXAM: CT ABDOMEN AND PELVIS WITHOUT CONTRAST TECHNIQUE: Multidetector CT imaging of the abdomen and pelvis was performed following the standard protocol without IV contrast. COMPARISON:  CT 06/16/2019, 04/17/2010 FINDINGS: Lower chest: Included lung bases are clear. Extensive coronary artery atherosclerosis. Hepatobiliary: Unremarkable unenhanced appearance of the liver. No focal liver lesion identified. Gallbladder within normal limits. No hyperdense gallstone. No biliary dilatation. Pancreas: Unremarkable. No pancreatic ductal dilatation or surrounding inflammatory changes. Spleen: Normal in size without focal abnormality. Adrenals/Urinary Tract: Adrenal glands are unremarkable. Kidneys are normal, without renal calculi, focal lesion, or hydronephrosis. Urinary bladder is within normal limits for the degree of distension. Pelvic floor laxity with mild cystocele. Stomach/Bowel: Stomach is within normal limits. Appendix appears normal (series 2, image 51). No evidence of bowel wall thickening, distention, or inflammatory changes. Vascular/Lymphatic: Aortic atherosclerosis. No enlarged abdominal or pelvic lymph nodes. Small right external iliac chain lymph node is unchanged in size from prior. Reproductive: Abnormal appearance of the uterus with prominent fluid distension of the endometrial cavity and areas of surface nodularity (series 2, images 69-73). Probable exophytic posterior uterine body fibroid appears unchanged from prior. No adnexal abnormality. Other: No free fluid. No abdominopelvic fluid collection. No pneumoperitoneum. No abdominal wall hernia. Well circumscribed 2.1 cm peripherally calcified lesion anterior to the liver is unchanged compared to previous CT from 2011, benign. Musculoskeletal: No acute or significant osseous findings. Moderate arthropathy of the right hip  joint. Facet predominant degenerative changes within the lumbar spine. No acute osseous abnormality. No suspicious bone lesion. IMPRESSION: 1. Abnormal appearance of the uterus with prominent fluid distension of the endometrial cavity and areas of surface nodularity. Findings are concerning for endometrial malignancy. Further evaluation with pelvic ultrasound is recommended. 2. Pelvic floor laxity with mild cystocele. 3. Aortic and coronary artery atherosclerosis (ICD10-I70.0). Electronically Signed   By: Davina Poke D.O.   On: 04/16/2021 13:50   DG Chest 2 View  Result Date: 04/16/2021 CLINICAL DATA:  Generalized weakness for 2 days. EXAM: CHEST - 2 VIEW COMPARISON:  Chest CT 12/09/2020 and chest radiograph 12/08/2020. FINDINGS: Trachea is midline. Heart size stable. Lungs are clear. No pleural fluid. IMPRESSION: No acute findings. Electronically Signed   By: Lorin Picket M.D.   On: 04/16/2021 13:05   CT Head Wo Contrast  Result Date: 04/16/2021 CLINICAL DATA:  Head trauma EXAM:  CT HEAD WITHOUT CONTRAST TECHNIQUE: Contiguous axial images were obtained from the base of the skull through the vertex without intravenous contrast. COMPARISON:  April 2021 FINDINGS: Brain: There is no acute intracranial hemorrhage, mass effect, or edema. Gray-white differentiation is preserved. There is no extra-axial fluid collection. Patchy and confluent low-attenuation in the supratentorial white matter is nonspecific but may reflect similar moderate chronic microvascular ischemic changes. Small chronic infarct of the right thalamus. Prominence of the ventricles and sulci reflects similar parenchymal volume loss. Vascular: There is atherosclerotic calcification at the skull base. Skull: Calvarium is unremarkable. Sinuses/Orbits: No acute finding. Other: None. IMPRESSION: No evidence of acute intracranial injury. Chronic/nonemergent findings detailed above. Electronically Signed   By: Macy Mis M.D.   On: 04/16/2021  13:45   US PELVIC COMPLETE WITH TRANSVAGINAL  Result Date: 04/16/2021 CLINICAL DATA:  Abnormal endometrial canal on recent CT EXAM: TRANSABDOMINAL AND TRANSVAGINAL ULTRASOUND OF PELVIS TECHNIQUE: Both transabdominal and transvaginal ultrasound examinations of the pelvis were performed. Transabdominal technique was performed for global imaging of the pelvis including uterus, ovaries, adnexal regions, and pelvic cul-de-sac. It was necessary to proceed with endovaginal exam following the transabdominal exam to visualize the ovaries and endometrium. COMPARISON:  CT from earlier in the same day. FINDINGS: Uterus Measurements: 9.1 x 5.8 x 6.5 cm. = volume: 180 mL. 3 cm lower uterine segment fibroid is noted. Endometrium Irregular thickened (4 cm) endometrium is noted with fluid within and a debris level likely representing a hematocrit level. Diffuse nodularity measuring up to 18 mm is identified. These changes are highly suspicious for endometrial carcinoma and further evaluation is recommended. Right ovary Not well visualized Left ovary Not well visualized Other findings No abnormal free fluid. IMPRESSION: Dilated and thickened endometrial canal with fluid and hematocrit level consistent with hemorrhage. Multiple areas of nodularity are noted. These are highly suspicious for endometrial carcinoma. Gynecologic referral is recommended for tissue sampling and diagnosis. Electronically Signed   By: Inez Catalina M.D.   On: 04/16/2021 18:12    Scheduled Meds:  aspirin EC  81 mg Oral Daily   enoxaparin (LOVENOX) injection  40 mg Subcutaneous Q24H   insulin aspart  0-15 Units Subcutaneous TID WC   Continuous Infusions:  cefTRIAXone (ROCEPHIN)  IV 1 g (04/17/21 1256)   lactated ringers 50 mL/hr at 04/17/21 1517    Assessment/Plan:  Clinical sepsis, present on admission.  Patient had fever, tachycardia, relative hypotension, lactic acidosis and positive urine analysis.  I added on a urine culture.  Empiric  Rocephin. Relative hypotension holding amlodipine, benazepril, spironolactone Abnormal CT scan and pelvic ultrasound.  Radiologist mentioned possibility of endometrial cancer.  Hemoglobin stable. Type 2 diabetes mellitus with relative hypoglycemia last night.  Patient on sliding scale insulin.  Hold metformin for now. History of TIA CVA.  Holding Plavix for now just in case gynecological procedure needed.  Continue aspirin.  Hold statin and check a CPK. Weakness and falls.  Physical therapy evaluation Patient declined COVID test.  Looks like she had previous infection of COVID on 12/08/2020.        Code Status:     Code Status Orders  (From admission, onward)           Start     Ordered   04/16/21 1602  Full code  Continuous        04/16/21 1603           Code Status History     Date Active Date Inactive Code Status Order  ID Comments User Context   12/09/2020 0134 12/11/2020 1950 Full Code [id number]  Sidney Ace Arvella Merles, MD ED   01/30/2020 2021 02/01/2020 1825 Full Code [id number]  Athena Masse, MD ED   03/31/2016 1630 04/02/2016 2015 Full Code [id number]  Gonzella Lex, MD Inpatient   03/09/2016 0430 03/09/2016 2231 Full Code [id number]  Saundra Shelling, MD Inpatient      Family Communication: Spoke with patient's daughter on the phone Disposition Plan: Status is: Inpatient  Dispo: The patient is from: Home              Anticipated d/c is to: Home              Patient currently with fever requiring IV antibiotics for sepsis infection   Difficult to place patient.  No.  Antibiotics: Rocephin  Time spent: 29 minutes  [place]

## 2021-04-17 NOTE — Evaluation (Signed)
Physical Therapy Evaluation Patient Details Name: Sabrina [name] MRN: [id number] DOB: [birth year] Today's Date: 04/17/2021   History of Present Illness  Pt is a 75 y.o. female with medical history significant for diabetes and hypertension, as well as anxiety with history of a brief psychiatric admission in 2017 who presents to the ER via private vehicle for evaluation of generalized weakness and a fall prior to her admission. MD assessment includes: sepsis, relative hypotension, and weakness.   Clinical Impression  Pt motivated to return home and performed well with functional tasks during the session.  Pt required no physical assistance during the session and reported that she felt like she was at her functional baseline with no need of further skilled PT services.  Pt's BP, SpO2, and HR were all WNL during the session with no adverse symptoms noted. Will complete PT orders at this time but will reassess pt pending a change in status upon receipt of new PT orders.     Follow Up Recommendations No PT follow up    Equipment Recommendations  None recommended by PT    Recommendations for Other Services       Precautions / Restrictions Precautions Precautions: Fall Restrictions Weight Bearing Restrictions: No      Mobility  Bed Mobility Overal bed mobility: Independent             General bed mobility comments: Good speed and effort    Transfers Overall transfer level: Modified independent Equipment used: Straight cane             General transfer comment: Good control and stability  Ambulation/Gait Ambulation/Gait assistance: Modified independent (Device/Increase time) Gait Distance (Feet): 30 Feet Assistive device: Straight cane Gait Pattern/deviations: Step-through pattern;Decreased step length - right;Decreased step length - left Gait velocity: decreased   General Gait Details: Pt steady with amb with a SPC including during 180 deg turns and navigating tight  spaces; amb distance limited secondary to confined to room with airborne precautions but pt able to easily ambulate 30+ feet without advese symptoms  Stairs            Wheelchair Mobility    Modified Rankin (Stroke Patients Only)       Balance Overall balance assessment: No apparent balance deficits (not formally assessed)                                           Pertinent Vitals/Pain Pain Assessment: No/denies pain    Home Living Family/patient expects to be discharged to:: Private residence Living Arrangements: Children Available Help at Discharge: Available 24 hours/day;Family Type of Home: House Home Access: Stairs to enter Entrance Stairs-Rails: None Entrance Stairs-Number of Steps: 2 Home Layout: Two level;Able to live on main level with bedroom/bathroom Home Equipment: Kasandra Knudsen - single point;Walker - 2 wheels;Walker - 4 wheels      Prior Function Level of Independence: Needs assistance   Gait / Transfers Assistance Needed: Mod Ind amb community distances with a SPC, no other fall history other than fall prior to admission  ADL's / Homemaking Assistance Needed: Occasional minimal assist with ADLs from dtr        Hand Dominance   Dominant Hand: Right    Extremity/Trunk Assessment   Upper Extremity Assessment Upper Extremity Assessment: Overall WFL for tasks assessed    Lower Extremity Assessment Lower Extremity Assessment: Overall WFL for tasks assessed  Communication   Communication: No difficulties  Cognition Arousal/Alertness: Awake/alert Behavior During Therapy: WFL for tasks assessed/performed Overall Cognitive Status: Within Functional Limits for tasks assessed                                        General Comments      Exercises     Assessment/Plan    PT Assessment Patent does not need any further PT services  PT Problem List         PT Treatment Interventions      PT Goals (Current  goals can be found in the Care Plan section)  Acute Rehab PT Goals PT Goal Formulation: All assessment and education complete, DC therapy    Frequency     Barriers to discharge        Co-evaluation               AM-PAC PT "6 Clicks" Mobility  Outcome Measure Help needed turning from your back to your side while in a flat bed without using bedrails?: None Help needed moving from lying on your back to sitting on the side of a flat bed without using bedrails?: None Help needed moving to and from a bed to a chair (including a wheelchair)?: None Help needed standing up from a chair using your arms (e.g., wheelchair or bedside chair)?: None Help needed to walk in hospital room?: None Help needed climbing 3-5 steps with a railing? : None 6 Click Score: 24    End of Session Equipment Utilized During Treatment: Gait belt Activity Tolerance: Patient tolerated treatment well Patient left: in chair;with chair alarm set;with nursing/sitter in room Nurse Communication: Mobility status PT Visit Diagnosis: Muscle weakness (generalized) (M62.81)    Time: 9449-6759 PT Time Calculation (min) (ACUTE ONLY): 34 min   Charges:   PT Evaluation $PT Eval Low Complexity: 1 Low          D. Scott Katara Griner PT, DPT 04/17/21, 5:03 PM

## 2021-04-17 NOTE — Consult Note (Signed)
Consult History and Physical   SERVICE: Gynecology  Patient Name: Sabrina [name] Patient MRN:   [id number]  CC: Abnormal imaging  HPI: Sabrina [name] is a 75 y.o. [id number] admitted with weakness and found to be septic and is being treated for urinary infection.   Imaging concerning for endometrial cancer.  CT scan of the head without contrast shows no acute findings CT scan of abdomen and pelvis shows abnormal appearance of the uterus with prominent fluid distension of the endometrial cavity and areas of surface nodularity. Findings are concerning for endometrial malignancy. Further evaluation with pelvic ultrasound is recommended.Pelvic floor laxity with mild cystocele. Aortic and coronary artery atherosclerosis  Twelve-lead EKG shows sinus tachycardia with low voltage QRS Patient had a positive SARS Coronavirus 2 POCT in 02/22 and refuses further testing at this time  Pt states she has lost significant weight this year without trying. No vaginal bleeding.   Review of Systems: positives in bold GEN:   fevers, chills, weight changes, appetite changes, fatigue, night sweats HEENT:  HA, vision changes, hearing loss, congestion, rhinorrhea, sinus pressure, dysphagia CV:   CP, palpitations PULM:  SOB, cough GI:  abd pain, N/V/D/C GU:  dysuria, urgency, frequency MSK:  arthralgias, myalgias, back pain, swelling SKIN:  rashes, color changes, pallor NEURO:  numbness, weakness, tingling, seizures, dizziness, tremors PSYCH:  depression, anxiety, behavioral problems, confusion  HEME/LYMPH:  easy bruising or bleeding ENDO:  heat/cold intolerance  Past Obstetrical History: OB History     Gravida  5   Para  3   Term  3   Preterm      AB  2   Living  3      SAB  1   IAB  1   Ectopic      Multiple      Live Births  3           Past Gynecologic History: No LMP recorded. Patient is postmenopausal. LMP at age 29yo.  Past Medical History: Past Medical History:   Diagnosis Date   Diabetes mellitus without complication (HCC)    High cholesterol    Hypertension     Past Surgical History:   Past Surgical History:  Procedure Laterality Date   DILATION AND CURETTAGE OF UTERUS     POLYPECTOMY      Family History:  family history includes Breast cancer in her maternal aunt; Breast cancer (age of onset: 8) in her maternal aunt.  Social History:  Social History   Socioeconomic History   Marital status: Divorced    Spouse name: Not on file   Number of children: Not on file   Years of education: Not on file   Highest education level: Not on file  Occupational History   Occupation: retired  Tobacco Use   Smoking status: Never   Smokeless tobacco: Never  Vaping Use   Vaping Use: Never used  Substance and Sexual Activity   Alcohol use: No   Drug use: No   Sexual activity: Not Currently    Birth control/protection: Post-menopausal  Other Topics Concern   Not on file  Social History Narrative   Not on file   Social Determinants of Health   Financial Resource Strain: Not on file  Food Insecurity: Not on file  Transportation Needs: Not on file  Physical Activity: Not on file  Stress: Not on file  Social Connections: Not on file  Intimate Partner Violence: Not on file    Home Medications:  Medications  reconciled in EPIC  No current facility-administered medications on file prior to encounter.   Current Outpatient Medications on File Prior to Encounter  Medication Sig Dispense Refill   amLODipine (NORVASC) 5 MG tablet Take 1 tablet (5 mg total) by mouth daily. 30 tablet 11   ASPIRIN LOW DOSE 81 MG EC tablet Take 81 mg by mouth daily.     benazepril (LOTENSIN) 20 MG tablet Take 20 mg by mouth daily.     clopidogrel (PLAVIX) 75 MG tablet Take 75 mg by mouth daily.     hydrocortisone cream 1 % Apply 1 application topically 2 (two) times daily.     Iron-Vitamins (GERITOL COMPLETE) TABS Take 1 tablet by mouth See admin instructions.  Take 1 tablet by mouth two to three times a week.     isosorbide mononitrate (IMDUR) 60 MG 24 hr tablet Take 60 mg by mouth daily.     LEVEMIR FLEXTOUCH 100 UNIT/ML FlexPen Inject 10 Units into the skin at bedtime.     metFORMIN (GLUCOPHAGE) 500 MG tablet Take 1,000 mg by mouth 2 (two) times daily.     rosuvastatin (CRESTOR) 40 MG tablet Take 0.5 tablets (20 mg total) by mouth at bedtime. 90 tablet 3   spironolactone (ALDACTONE) 25 MG tablet Take 25 mg by mouth daily.  0   trolamine salicylate (ASPERCREME) 10 % cream Apply 1 application topically as needed for muscle pain.     vitamin B-12 (CYANOCOBALAMIN) 100 MCG tablet Take 100 mcg by mouth daily.      Allergies:  Allergies  Allergen Reactions   Glimepiride Diarrhea and Other (See Comments)   Glipizide Other (See Comments)   Lisinopril Other (See Comments)    headache   Losartan Other (See Comments)    Headache   Norco [Hydrocodone-Acetaminophen] Other (See Comments)    Mood changes   Pioglitazone Swelling    On legs    Physical Exam:  Temp:  [98.6 F (37 C)-102.4 F (39.1 C)] 98.9 F (37.2 C) (06/23 1513) Pulse Rate:  [72-87] 72 (06/23 1214) Resp:  [17-20] 20 (06/23 1513) BP: (111-156)/(54-132) 111/54 (06/23 1214) SpO2:  [96 %-100 %] 100 % (06/23 1513)   General Appearance:  Well developed, well nourished, no acute distress, alert and oriented x3 HEENT:  Normocephalic atraumatic, extraocular movements intact, moist mucous membranes Cardiovascular:  Normal S1/S2, regular rate and rhythm, no murmurs Pulmonary:  clear to auscultation, no wheezes, rales or rhonchi, symmetric air entry, good air exchange Abdomen:  Bowel sounds present, soft, nontender, nondistended, no abnormal masses, no epigastric pain Extremities:  Full range of motion, no pedal edema, 2+ distal pulses, no tenderness Skin:  normal coloration and turgor, no rashes, no suspicious skin lesions noted  Neurologic:  Cranial nerves 2-12 grossly intact, normal  muscle tone, strength 5/5 all four extremities Psychiatric:  Normal mood and affect, appropriate, no AH/VH. However, she does feel persecuted and does not trust that we have her best interests in mind.  Pelvic:  deferred   Labs/Studies:   CBC and Coags:  Lab Results  Component Value Date   WBC 3.1 (L) 04/17/2021   NEUTOPHILPCT 80 04/16/2021   EOSPCT 0 04/16/2021   BASOPCT 0 04/16/2021   LYMPHOPCT 15 04/16/2021   HGB 11.5 (L) 04/17/2021   HCT 33.9 (L) 04/17/2021   MCV 89.4 04/17/2021   PLT 179 04/17/2021   INR 1.2 04/17/2021   CMP:  Lab Results  Component Value Date   NA 134 (L) 04/17/2021   K  4.0 04/17/2021   CL 99 04/17/2021   CO2 27 04/17/2021   BUN 15 04/17/2021   CREATININE 0.85 04/17/2021   CREATININE 1.11 (H) 04/16/2021   CREATININE 0.92 12/10/2020   PROT 7.7 04/16/2021   BILITOT 0.9 04/16/2021   ALT 46 (H) 04/16/2021   AST 64 (H) 04/16/2021   ALKPHOS 55 04/16/2021    Other Imaging: CT ABDOMEN PELVIS WO CONTRAST  Result Date: 04/16/2021 CLINICAL DATA:  Abdominal pain, fever, generalized weakness EXAM: CT ABDOMEN AND PELVIS WITHOUT CONTRAST TECHNIQUE: Multidetector CT imaging of the abdomen and pelvis was performed following the standard protocol without IV contrast. COMPARISON:  CT 06/16/2019, 04/17/2010 FINDINGS: Lower chest: Included lung bases are clear. Extensive coronary artery atherosclerosis. Hepatobiliary: Unremarkable unenhanced appearance of the liver. No focal liver lesion identified. Gallbladder within normal limits. No hyperdense gallstone. No biliary dilatation. Pancreas: Unremarkable. No pancreatic ductal dilatation or surrounding inflammatory changes. Spleen: Normal in size without focal abnormality. Adrenals/Urinary Tract: Adrenal glands are unremarkable. Kidneys are normal, without renal calculi, focal lesion, or hydronephrosis. Urinary bladder is within normal limits for the degree of distension. Pelvic floor laxity with mild cystocele.  Stomach/Bowel: Stomach is within normal limits. Appendix appears normal (series 2, image 51). No evidence of bowel wall thickening, distention, or inflammatory changes. Vascular/Lymphatic: Aortic atherosclerosis. No enlarged abdominal or pelvic lymph nodes. Small right external iliac chain lymph node is unchanged in size from prior. Reproductive: Abnormal appearance of the uterus with prominent fluid distension of the endometrial cavity and areas of surface nodularity (series 2, images 69-73). Probable exophytic posterior uterine body fibroid appears unchanged from prior. No adnexal abnormality. Other: No free fluid. No abdominopelvic fluid collection. No pneumoperitoneum. No abdominal wall hernia. Well circumscribed 2.1 cm peripherally calcified lesion anterior to the liver is unchanged compared to previous CT from 2011, benign. Musculoskeletal: No acute or significant osseous findings. Moderate arthropathy of the right hip joint. Facet predominant degenerative changes within the lumbar spine. No acute osseous abnormality. No suspicious bone lesion. IMPRESSION: 1. Abnormal appearance of the uterus with prominent fluid distension of the endometrial cavity and areas of surface nodularity. Findings are concerning for endometrial malignancy. Further evaluation with pelvic ultrasound is recommended. 2. Pelvic floor laxity with mild cystocele. 3. Aortic and coronary artery atherosclerosis (ICD10-I70.0). Electronically Signed   By: Davina Poke D.O.   On: 04/16/2021 13:50   DG Chest 2 View  Result Date: 04/16/2021 CLINICAL DATA:  Generalized weakness for 2 days. EXAM: CHEST - 2 VIEW COMPARISON:  Chest CT 12/09/2020 and chest radiograph 12/08/2020. FINDINGS: Trachea is midline. Heart size stable. Lungs are clear. No pleural fluid. IMPRESSION: No acute findings. Electronically Signed   By: Lorin Picket M.D.   On: 04/16/2021 13:05   CT Head Wo Contrast  Result Date: 04/16/2021 CLINICAL DATA:  Head trauma EXAM:  CT HEAD WITHOUT CONTRAST TECHNIQUE: Contiguous axial images were obtained from the base of the skull through the vertex without intravenous contrast. COMPARISON:  April 2021 FINDINGS: Brain: There is no acute intracranial hemorrhage, mass effect, or edema. Gray-white differentiation is preserved. There is no extra-axial fluid collection. Patchy and confluent low-attenuation in the supratentorial white matter is nonspecific but may reflect similar moderate chronic microvascular ischemic changes. Small chronic infarct of the right thalamus. Prominence of the ventricles and sulci reflects similar parenchymal volume loss. Vascular: There is atherosclerotic calcification at the skull base. Skull: Calvarium is unremarkable. Sinuses/Orbits: No acute finding. Other: None. IMPRESSION: No evidence of acute intracranial injury. Chronic/nonemergent findings detailed above.  Electronically Signed   By: Macy Mis M.D.   On: 04/16/2021 13:45   US PELVIC COMPLETE WITH TRANSVAGINAL  Result Date: 04/16/2021 CLINICAL DATA:  Abnormal endometrial canal on recent CT EXAM: TRANSABDOMINAL AND TRANSVAGINAL ULTRASOUND OF PELVIS TECHNIQUE: Both transabdominal and transvaginal ultrasound examinations of the pelvis were performed. Transabdominal technique was performed for global imaging of the pelvis including uterus, ovaries, adnexal regions, and pelvic cul-de-sac. It was necessary to proceed with endovaginal exam following the transabdominal exam to visualize the ovaries and endometrium. COMPARISON:  CT from earlier in the same day. FINDINGS: Uterus Measurements: 9.1 x 5.8 x 6.5 cm. = volume: 180 mL. 3 cm lower uterine segment fibroid is noted. Endometrium Irregular thickened (4 cm) endometrium is noted with fluid within and a debris level likely representing a hematocrit level. Diffuse nodularity measuring up to 18 mm is identified. These changes are highly suspicious for endometrial carcinoma and further evaluation is recommended.  Right ovary Not well visualized Left ovary Not well visualized Other findings No abnormal free fluid. IMPRESSION: Dilated and thickened endometrial canal with fluid and hematocrit level consistent with hemorrhage. Multiple areas of nodularity are noted. These are highly suspicious for endometrial carcinoma. Gynecologic referral is recommended for tissue sampling and diagnosis. Electronically Signed   By: Inez Catalina M.D.   On: 04/16/2021 18:12     Assessment / Plan:   TAIZ BICKLE is a 75 y.o. [id number] who presents with sepsis from urinary source, found to have uterine nodularity suspicious for malignancy  1. I sat with the patient and discussed the imaging findings, as well as my recommendation for tissue sample pathology. I described endometrial biopsy, which would give [place] the findings we need. She is concerned, however, that more medical interventions would be worse for her. She declines EMBx in the hospital at this time, and tells me "it is up for discussion" and that she might f/u as an outpatient. She tells me of female family members who died shortly after surgery or with doctors diagnosis, and she is not keen on having something similar happen to her. If she does have cancer, she says she's been diagnosed in the past and is still here. She also says she would decline surgery.   She requests that I don't speak to her family members about these findings or recommendations. She states that she "wants to live my life" and that prior doctors "have messed up" her daughter's head with talk that she is crazy, which she adamantly denies.  At this time, I will not pursue biopsy, and if I can I will try to proceed ss an outpatient in the office. She is clear, though, that she does not want to pursue diagnosis or treatment, and is not certain that there is anything to diagnose. I respect her decisions and requests and unless she is deemed incompetent to make them, we will hold pursing invasive testing or  exams.

## 2021-04-18 DIAGNOSIS — E1169 Type 2 diabetes mellitus with other specified complication: Secondary | ICD-10-CM

## 2021-04-18 DIAGNOSIS — T796XXS Traumatic ischemia of muscle, sequela: Secondary | ICD-10-CM

## 2021-04-18 DIAGNOSIS — T796XXA Traumatic ischemia of muscle, initial encounter: Secondary | ICD-10-CM

## 2021-04-18 LAB — CBC WITH DIFFERENTIAL/PLATELET
Abs Immature Granulocytes: 0 10*3/uL (ref 0.00–0.07)
Basophils Absolute: 0 10*3/uL (ref 0.0–0.1)
Basophils Relative: 0 %
Eosinophils Absolute: 0 10*3/uL (ref 0.0–0.5)
Eosinophils Relative: 1 %
HCT: 33.7 % — ABNORMAL LOW (ref 36.0–46.0)
Hemoglobin: 11.5 g/dL — ABNORMAL LOW (ref 12.0–15.0)
Immature Granulocytes: 0 %
Lymphocytes Relative: 50 %
Lymphs Abs: 1.3 10*3/uL (ref 0.7–4.0)
MCH: 30.2 pg (ref 26.0–34.0)
MCHC: 34.1 g/dL (ref 30.0–36.0)
MCV: 88.5 fL (ref 80.0–100.0)
Monocytes Absolute: 0.2 10*3/uL (ref 0.1–1.0)
Monocytes Relative: 7 %
Neutro Abs: 1.1 10*3/uL — ABNORMAL LOW (ref 1.7–7.7)
Neutrophils Relative %: 42 %
Platelets: 171 10*3/uL (ref 150–400)
RBC: 3.81 MIL/uL — ABNORMAL LOW (ref 3.87–5.11)
RDW: 12.3 % (ref 11.5–15.5)
Smear Review: NORMAL
WBC: 2.6 10*3/uL — ABNORMAL LOW (ref 4.0–10.5)
nRBC: 0 % (ref 0.0–0.2)

## 2021-04-18 LAB — GLUCOSE, CAPILLARY
Glucose-Capillary: 154 mg/dL — ABNORMAL HIGH (ref 70–99)
Glucose-Capillary: 136 mg/dL — ABNORMAL HIGH (ref 70–99)
Glucose-Capillary: 136 mg/dL — ABNORMAL HIGH (ref 70–99)
Glucose-Capillary: 115 mg/dL — ABNORMAL HIGH (ref 70–99)

## 2021-04-18 LAB — BASIC METABOLIC PANEL
Anion gap: 6 (ref 5–15)
BUN: 11 mg/dL (ref 8–23)
CO2: 28 mmol/L (ref 22–32)
Calcium: 9.3 mg/dL (ref 8.9–10.3)
Chloride: 102 mmol/L (ref 98–111)
Creatinine, Ser: 0.84 mg/dL (ref 0.44–1.00)
GFR, Estimated: >60 mL/min (ref >60)
Glucose, Bld: 165 mg/dL — ABNORMAL HIGH (ref 70–99)
Potassium: 3.5 mmol/L (ref 3.5–5.1)
Sodium: 136 mmol/L (ref 135–145)

## 2021-04-18 MED ORDER — DOXYCYCLINE HYCLATE 100 MG PO TABS
100.0000 mg | ORAL_TABLET | Freq: Two times a day (BID) | ORAL | Status: DC
  Administered 2021-04-18 – 2021-04-19 (×3): 100 mg via ORAL
  Filled 2021-04-18 (×3): qty 1

## 2021-04-18 MED ORDER — CLOPIDOGREL BISULFATE 75 MG PO TABS
75.0000 mg | ORAL_TABLET | Freq: Every day | ORAL | Status: DC
  Administered 2021-04-18 – 2021-04-19 (×2): 75 mg via ORAL
  Filled 2021-04-18 (×2): qty 1

## 2021-04-18 NOTE — Progress Notes (Signed)
Patient ID: Sabrina [name], female   DOB: 03/23/1946, 75 y.o.   MRN: [id number] [facility] PROGRESS NOTE  Sabrina [name] DJT:[id number] DOB: Jul 14, 1946 DOA: 04/16/2021 PCP: [name], MD  HPI/Subjective: Patient and daughter at the bedside.  Patient had numerous questions.  Patient came in with weakness and found to have a positive urinalysis.  Urine culture still pending.  Had a fever of 102 this morning.  Objective: Vitals:   04/18/21 0846 04/18/21 1231  BP: (!) 138/52 121/64  Pulse: 63 61  Resp: 17 17  Temp: 98.6 F (37 C) 98.2 F (36.8 C)  SpO2: 100% 100%    Intake/Output Summary (Last 24 hours) at 04/18/2021 1406 Last data filed at 04/18/2021 3009 Gross per 24 hour  Intake 2794.3 ml  Output 2300 ml  Net 494.3 ml   Filed Weights   04/16/21 1136  Weight: 58.1 kg    ROS: Review of Systems  Constitutional:  Positive for malaise/fatigue.  Respiratory:  Negative for shortness of breath.   Cardiovascular:  Negative for chest pain.  Gastrointestinal:  Negative for abdominal pain, nausea and vomiting.  Exam: Physical Exam HENT:     Head: Normocephalic.     Mouth/Throat:     Pharynx: No oropharyngeal exudate.  Eyes:     General: Lids are normal.     Conjunctiva/sclera: Conjunctivae normal.  Cardiovascular:     Rate and Rhythm: Normal rate and regular rhythm.     Heart sounds: Normal heart sounds, S1 normal and S2 normal.  Pulmonary:     Breath sounds: Normal breath sounds. No decreased breath sounds, wheezing, rhonchi or rales.  Abdominal:     Palpations: Abdomen is soft.     Tenderness: There is no abdominal tenderness.  Musculoskeletal:     Right lower leg: No swelling.     Left lower leg: No swelling.  Skin:    General: Skin is warm.     Findings: No rash.  Neurological:     Mental Status: She is alert and oriented to person, place, and time.     Data Reviewed: Basic Metabolic Panel: Recent Labs  Lab 04/16/21 1140 04/17/21 0512  04/18/21 0618  NA 130* 134* 136  K 4.2 4.0 3.5  CL 95* 99 102  CO2 25 27 28   GLUCOSE 180* 156* 165*  BUN 18 15 11   CREATININE 1.11* 0.85 0.84  CALCIUM 9.6 9.3 9.3   Liver Function Tests: Recent Labs  Lab 04/16/21 1140  AST 64*  ALT 46*  ALKPHOS 55  BILITOT 0.9  PROT 7.7  ALBUMIN 4.3   CBC: Recent Labs  Lab 04/16/21 1140 04/17/21 0512 04/18/21 0618  WBC 3.6* 3.1* 2.6*  NEUTROABS 2.9  --  1.1*  HGB 11.9* 11.5* 11.5*  HCT 36.0 33.9* 33.7*  MCV 89.3 89.4 88.5  PLT 209 179 171   Cardiac Enzymes: Recent Labs  Lab 04/17/21 0512  CKTOTAL 583*   BNP (last 3 results) Recent Labs    12/08/20 2144  BNP 34.8     CBG: Recent Labs  Lab 04/17/21 1145 04/17/21 1631 04/17/21 2108 04/18/21 0851 04/18/21 1234  GLUCAP 140* 113* 140* 154* 136*    Recent Results (from the past 240 hour(s))  Blood culture (routine x 2)     Status: None (Preliminary result)   Collection Time: 04/16/21 12:56 PM   Specimen: BLOOD  Result Value Ref Range Status   Specimen Description BLOOD BLOOD RIGHT FOREARM  Final   Special Requests  Final    BOTTLES DRAWN AEROBIC AND ANAEROBIC Blood Culture results may not be optimal due to an excessive volume of blood received in culture bottles   Culture   Final    NO GROWTH < 24 HOURS Performed at [facility], [address] [place], [place] [address]    Report Status PENDING  Incomplete  Blood culture (routine x 2)     Status: None (Preliminary result)   Collection Time: 04/16/21 12:56 PM   Specimen: BLOOD  Result Value Ref Range Status   Specimen Description BLOOD BLOOD LEFT FOREARM  Final   Special Requests   Final    BOTTLES DRAWN AEROBIC AND ANAEROBIC Blood Culture results may not be optimal due to an excessive volume of blood received in culture bottles   Culture   Final    NO GROWTH < 24 HOURS Performed at [facility], [address] [place], [place] [address]    Report Status PENDING  Incomplete      Studies: US PELVIC COMPLETE WITH TRANSVAGINAL  Result Date: 04/16/2021 CLINICAL DATA:  Abnormal endometrial canal on recent CT EXAM: TRANSABDOMINAL AND TRANSVAGINAL ULTRASOUND OF PELVIS TECHNIQUE: Both transabdominal and transvaginal ultrasound examinations of the pelvis were performed. Transabdominal technique was performed for global imaging of the pelvis including uterus, ovaries, adnexal regions, and pelvic cul-de-sac. It was necessary to proceed with endovaginal exam following the transabdominal exam to visualize the ovaries and endometrium. COMPARISON:  CT from earlier in the same day. FINDINGS: Uterus Measurements: 9.1 x 5.8 x 6.5 cm. = volume: 180 mL. 3 cm lower uterine segment fibroid is noted. Endometrium Irregular thickened (4 cm) endometrium is noted with fluid within and a debris level likely representing a hematocrit level. Diffuse nodularity measuring up to 18 mm is identified. These changes are highly suspicious for endometrial carcinoma and further evaluation is recommended. Right ovary Not well visualized Left ovary Not well visualized Other findings No abnormal free fluid. IMPRESSION: Dilated and thickened endometrial canal with fluid and hematocrit level consistent with hemorrhage. Multiple areas of nodularity are noted. These are highly suspicious for endometrial carcinoma. Gynecologic referral is recommended for tissue sampling and diagnosis. Electronically Signed   By: Inez Catalina M.D.   On: 04/16/2021 18:12    Scheduled Meds:  aspirin EC  81 mg Oral Daily   clopidogrel  75 mg Oral Daily   doxycycline  100 mg Oral Q12H   insulin aspart  0-15 Units Subcutaneous TID WC   Continuous Infusions:  cefTRIAXone (ROCEPHIN)  IV 1 g (04/18/21 1249)   lactated ringers 50 mL/hr at 04/17/21 1517    Assessment/Plan:  Clinical sepsis, present on admission.  Last night had another fever of 102.  Leukopenia with left shift.  Urine culture pending at this point.  Blood cultures so far  negative.  On empiric Rocephin.  I added doxycycline to cover atypical organisms, tick borne and would also cover gynecologic organisms.  Continue to monitor here in the hospital. Relative hypotension.  Continue to hold amlodipine benazepril and spironolactone.  Blood pressure normal today.  May end up restarting 1 medication at a time. Abnormal CT scan and pelvic ultrasound.  Radiologist mention possibility of endometrial cancer.  Seen by Dr. Leafy Ro.  Patient did not want to have a biopsy while in the hospital.  Will need follow-up as outpatient to rule out endometrial cancer. Type 2 diabetes mellitus with relative hypoglycemia on presentation.  Hemoglobin A1c 7.1.  Sugars acceptable right now off of medications.  Mild rhabdomyolysis.  Holding statin at this point.  On gentle IV fluids. Weakness and falls.  Did well with physical therapy History of TIA and CVA in the past.  Restart Plavix since the patient declined a gynecological procedure at this point. Patient declined COVID test.  Previous COVID-positive on 12/08/2020.        Code Status:     Code Status Orders  (From admission, onward)           Start     Ordered   04/16/21 1602  Full code  Continuous        04/16/21 1603           Code Status History     Date Active Date Inactive Code Status Order ID Comments User Context   12/09/2020 0134 12/11/2020 1950 Full Code [id number]  Christel Mormon, MD ED   01/30/2020 2021 02/01/2020 1825 Full Code [id number]  Athena Masse, MD ED   03/31/2016 1630 04/02/2016 2015 Full Code [id number]  Clapacs, Madie Reno, MD Inpatient   03/09/2016 0430 03/09/2016 2231 Full Code [id number]  Saundra Shelling, MD Inpatient      Family Communication: Spoke with daughter at the bedside Disposition Plan: Status is: Inpatient  Dispo: The patient is from: Home              Anticipated d/c is to: Home.              Patient currently had a fever of 102 late last night.  Not ready for disposition yet.  Continue  antibiotics.  Urine culture still pending.   Difficult to place patient.  No.  Antibiotics: Rocephin, doxycycline  Time spent: 27 minutes  [place]

## 2021-04-18 NOTE — Care Management Important Message (Signed)
Important Message  Patient Details  Name: Sabrina [name] MRN: [id number] Date of Birth: [birth year]   Medicare Important Message Given:  N/A - LOS <3 / Initial given by admissions  Initial Medicare IM reviewed with [name], daughter by S. Geanie Cooley, Patient Access Associate on 04/17/2021 at 11:11am.   Dannette Barbara 04/18/2021, 8:20 AM

## 2021-04-19 LAB — URINE CULTURE
Culture: NO GROWTH
Special Requests: NORMAL

## 2021-04-19 LAB — BASIC METABOLIC PANEL
Anion gap: 8 (ref 5–15)
BUN: 11 mg/dL (ref 8–23)
CO2: 29 mmol/L (ref 22–32)
Calcium: 9.2 mg/dL (ref 8.9–10.3)
Chloride: 102 mmol/L (ref 98–111)
Creatinine, Ser: 0.81 mg/dL (ref 0.44–1.00)
GFR, Estimated: >60 mL/min (ref >60)
Glucose, Bld: 137 mg/dL — ABNORMAL HIGH (ref 70–99)
Potassium: 3.4 mmol/L — ABNORMAL LOW (ref 3.5–5.1)
Sodium: 139 mmol/L (ref 135–145)

## 2021-04-19 LAB — GLUCOSE, CAPILLARY
Glucose-Capillary: 136 mg/dL — ABNORMAL HIGH (ref 70–99)
Glucose-Capillary: 189 mg/dL — ABNORMAL HIGH (ref 70–99)

## 2021-04-19 LAB — CK: Total CK: 209 U/L (ref 38–234)

## 2021-04-19 MED ORDER — CEPHALEXIN 500 MG PO CAPS: 500.0000 mg | ORAL_CAPSULE | Freq: Three times a day (TID) | ORAL | 0 refills | Status: AC

## 2021-04-19 MED ORDER — CEPHALEXIN 500 MG PO CAPS
500.0000 mg | ORAL_CAPSULE | Freq: Three times a day (TID) | ORAL | Status: DC
  Administered 2021-04-19: 13:00:00 500 mg via ORAL
  Filled 2021-04-19: qty 1

## 2021-04-19 MED ORDER — DOXYCYCLINE HYCLATE 100 MG PO TABS: 100.0000 mg | ORAL_TABLET | Freq: Two times a day (BID) | ORAL | 0 refills | Status: AC

## 2021-04-19 MED ORDER — POTASSIUM CHLORIDE CRYS ER 20 MEQ PO TBCR
40.0000 meq | EXTENDED_RELEASE_TABLET | Freq: Once | ORAL | Status: AC
  Administered 2021-04-19: 10:00:00 40 meq via ORAL
  Filled 2021-04-19: qty 2

## 2021-04-19 NOTE — Discharge Summary (Signed)
[facility] [place] NAME: Sabrina [name]    MR#:  [id number]  DATE OF BIRTH:  [birth year]  DATE OF ADMISSION:  04/16/2021 ADMITTING PHYSICIAN: Collier Bullock, MD  DATE OF DISCHARGE: 04/19/2021  2:02 PM  PRIMARY CARE PHYSICIAN: [name], MD    ADMISSION DIAGNOSIS:  Lower urinary tract infectious disease [N39.0] Weakness [R53.1] Increased endometrial stripe thickness [R93.89] Abnormal finding on CT scan [R93.89] Sepsis ([place]) [A41.9] Sepsis without acute organ dysfunction, due to unspecified organism ([place]) [A41.9]  DISCHARGE DIAGNOSIS:  Principal Problem:   Sepsis ([place]) Active Problems:   Essential hypertension   Type 2 diabetes mellitus with hyperlipidemia ([place])   Hyperlipidemia   Acute cystitis with hematuria   Abnormal finding on CT scan   Hypotension   Weakness   Traumatic rhabdomyolysis ([place])   SECONDARY DIAGNOSIS:   Past Medical History:  Diagnosis Date  . Diabetes mellitus without complication ([place])   . High cholesterol   . Hypertension     HOSPITAL COURSE:   Clinical sepsis, present on admission.  The patient had fevers during the hospital course.  Patient was started on Rocephin initially.  Patient did have leukopenia with a left shift.  Initial urine analysis was positive.  They did not send off a urine culture on the day of admission so I added on a urine culture.  This urine culture was negative.  Blood cultures also negative I added doxycycline on 04/18/2021 which would cover atypical organisms, tickborne organisms and also gynecologic organisms.  Patient was afebrile and feeling better.  I will discharge home on a few more days of Keflex.  Has appointment with PMD on Wednesday Relative hypotension on presentation.  We held the patient's blood pressure medications during the hospital course.  Since blood pressure started to trend higher can go back on spironolactone as outpatient. Abnormal CT scan and pelvic  ultrasound.  Radiologist mention the possibility of endometrial cancer.  Dr. Leafy Ro came to talk to her about an endometrial biopsy.  The patient did not want to have a biopsy while here in the hospital.  The patient would have to hold Plavix for 5 days prior to any procedure. Type 2 diabetes mellitus with relative hypoglycemia on presentation.  Hemoglobin A1c 7.1.  All sugars have been under 140 off of medications.  Discontinue insulin.  Can consider restarting metformin as outpatient but since the patient had lactic acidosis here I will hold off on metformin upon discharge.  Diet controlled. Mild rhabdomyolysis.  Continue to hold statin at this point.  Improved with IV fluid hydration Weakness and falls.  Did well with physical therapy and no need for outpatient PT.  We will set up home health RN, aide and social work. History of TIA and CVA in the past.  Restarted Plavix since patient declined gynecological procedure at this point.  Continue aspirin and Plavix.  Statin on hold. Patient declined COVID test while here in the hospital.  Did have a previous COVID-positive test on 12/08/2020. Acute kidney injury with creatinine 1.11 on presentation down to 0.81 upon discharge  DISCHARGE CONDITIONS:   Satisfactory  CONSULTS OBTAINED:  Treatment Team:  Benjaman Kindler, MD  DRUG ALLERGIES:   Allergies  Allergen Reactions  . Glimepiride Diarrhea and Other (See Comments)  . Glipizide Other (See Comments)  . Lisinopril Other (See Comments)    headache  . Losartan Other (See Comments)    Headache  . Norco [Hydrocodone-Acetaminophen] Other (See Comments)  Mood changes  . Pioglitazone Swelling    On legs    DISCHARGE MEDICATIONS:   Allergies as of 04/19/2021       Reactions   Glimepiride Diarrhea, Other (See Comments)   Glipizide Other (See Comments)   Lisinopril Other (See Comments)   headache   Losartan Other (See Comments)   Headache   Norco [hydrocodone-acetaminophen] Other (See  Comments)   Mood changes   Pioglitazone Swelling   On legs        Medication List     STOP taking these medications    amLODipine 5 MG tablet Commonly known as: NORVASC   benazepril 20 MG tablet Commonly known as: LOTENSIN   hydrocortisone cream 1 %   isosorbide mononitrate 60 MG 24 hr tablet Commonly known as: IMDUR   Levemir FlexTouch 100 UNIT/ML FlexPen Generic drug: insulin detemir   metFORMIN 500 MG tablet Commonly known as: GLUCOPHAGE   rosuvastatin 40 MG tablet Commonly known as: CRESTOR       TAKE these medications    Aspirin Low Dose 81 MG EC tablet Generic drug: aspirin Take 81 mg by mouth daily.   cephALEXin 500 MG capsule Commonly known as: KEFLEX Take 1 capsule (500 mg total) by mouth every 8 (eight) hours for 2 days.   clopidogrel 75 MG tablet Commonly known as: PLAVIX Take 75 mg by mouth daily.   doxycycline 100 MG tablet Commonly known as: VIBRA-TABS Take 1 tablet (100 mg total) by mouth every 12 (twelve) hours for 6 days.   Geritol Complete Tabs Take 1 tablet by mouth See admin instructions. Take 1 tablet by mouth two to three times a week.   spironolactone 25 MG tablet Commonly known as: ALDACTONE Take 25 mg by mouth daily.   trolamine salicylate 10 % cream Commonly known as: ASPERCREME Apply 1 application topically as needed for muscle pain.   vitamin B-12 100 MCG tablet Commonly known as: CYANOCOBALAMIN Take 100 mcg by mouth daily.         DISCHARGE INSTRUCTIONS:   Follow-up PMD with scheduled appointment on Wednesday Follow-up with Dr. Leafy Ro in 2 weeks  If you experience worsening of your admission symptoms, develop shortness of breath, life threatening emergency, suicidal or homicidal thoughts you must seek medical attention immediately by calling 911 or calling your MD immediately  if symptoms less severe.  You Must read complete instructions/literature along with all the possible adverse reactions/side effects  for all the Medicines you take and that have been prescribed to you. Take any new Medicines after you have completely understood and accept all the possible adverse reactions/side effects.   Please note  You were cared for by a hospitalist during your hospital stay. If you have any questions about your discharge medications or the care you received while you were in the hospital after you are discharged, you can call the unit and asked to speak with the hospitalist on call if the hospitalist that took care of you is not available. Once you are discharged, your primary care physician will handle any further medical issues. Please note that NO REFILLS for any discharge medications will be authorized once you are discharged, as it is imperative that you return to your primary care physician (or establish a relationship with a primary care physician if you do not have one) for your aftercare needs so that they can reassess your need for medications and monitor your lab values.    Today   CHIEF COMPLAINT:   Chief  Complaint  Patient presents with  . Fall  . Weakness    HISTORY OF PRESENT ILLNESS:  Kashmir Leedy  is a 75 y.o. female came in with weakness and a fall   VITAL SIGNS:  Blood pressure (!) 141/63, pulse 62, temperature 98.2 F (36.8 C), resp. rate 17, height 5\' 2"  (1.575 m), weight 58.1 kg, SpO2 100 %.  I/O:   Intake/Output Summary (Last 24 hours) at 04/19/2021 1429 Last data filed at 04/19/2021 0403 Gross per 24 hour  Intake 2123.86 ml  Output 850 ml  Net 1273.86 ml    PHYSICAL EXAMINATION:  GENERAL:  75 y.o.-year-old patient lying in the bed with no acute distress.  EYES: Pupils equal, round, reactive to light and accommodation. No scleral icterus. HEENT: Head atraumatic, normocephalic. Oropharynx and nasopharynx clear.   LUNGS: Normal breath sounds bilaterally, no wheezing, rales,rhonchi or crepitation. No use of accessory muscles of respiration.  CARDIOVASCULAR: S1, S2  normal. No murmurs, rubs, or gallops.  ABDOMEN: Soft, non-tender, non-distended.  EXTREMITIES: No pedal edema.  NEUROLOGIC: Cranial nerves II through XII are intact. Muscle strength 5/5 in all extremities. Sensation intact. Gait not checked.  PSYCHIATRIC: The patient is alert and oriented x 3.  SKIN: No obvious rash, lesion, or ulcer.   DATA REVIEW:   CBC Recent Labs  Lab 04/18/21 0618  WBC 2.6*  HGB 11.5*  HCT 33.7*  PLT 171    Chemistries  Recent Labs  Lab 04/16/21 1140 04/17/21 0512 04/19/21 0534  NA 130*   < > 139  K 4.2   < > 3.4*  CL 95*   < > 102  CO2 25   < > 29  GLUCOSE 180*   < > 137*  BUN 18   < > 11  CREATININE 1.11*   < > 0.81  CALCIUM 9.6   < > 9.2  AST 64*  --   --   ALT 46*  --   --   ALKPHOS 55  --   --   BILITOT 0.9  --   --    < > = values in this interval not displayed.     Microbiology Results  Results for orders placed or performed during the hospital encounter of 04/16/21  Blood culture (routine x 2)     Status: None (Preliminary result)   Collection Time: 04/16/21 12:56 PM   Specimen: BLOOD  Result Value Ref Range Status   Specimen Description BLOOD BLOOD RIGHT FOREARM  Final   Special Requests   Final    BOTTLES DRAWN AEROBIC AND ANAEROBIC Blood Culture results may not be optimal due to an excessive volume of blood received in culture bottles   Culture   Final    NO GROWTH 3 DAYS Performed at [facility], [address] [place], [place] [address]    Report Status PENDING  Incomplete  Blood culture (routine x 2)     Status: None (Preliminary result)   Collection Time: 04/16/21 12:56 PM   Specimen: BLOOD  Result Value Ref Range Status   Specimen Description BLOOD BLOOD LEFT FOREARM  Final   Special Requests   Final    BOTTLES DRAWN AEROBIC AND ANAEROBIC Blood Culture results may not be optimal due to an excessive volume of blood received in culture bottles   Culture   Final    NO GROWTH 3 DAYS Performed at [facility], [address], [place], [place] [address]    Report Status PENDING  Incomplete  Urine Culture     Status: None   Collection Time: 04/17/21  2:40 PM   Specimen: Urine, Clean Catch  Result Value Ref Range Status   Specimen Description   Final    URINE, CLEAN CATCH Performed at [facility], [address], [place], [place] [address]    Special Requests   Final    Normal Performed at [facility], [address] [place], [place] [address]    Culture   Final    NO GROWTH Performed at [facility], [place] [address] [place],  [address]    Report Status 04/19/2021 FINAL  Final     Management plans discussed with the patient, family and they are in agreement.  CODE STATUS:     Code Status Orders  (From admission, onward)           Start     Ordered   04/16/21 1602  Full code  Continuous        04/16/21 1603           Code Status History     Date Active Date Inactive Code Status Order ID Comments User Context   12/09/2020 0134 12/11/2020 1950 Full Code [id number]  Christel Mormon, MD ED   01/30/2020 2021 02/01/2020 1825 Full Code [id number]  Athena Masse, MD ED   03/31/2016 1630 04/02/2016 2015 Full Code [id number]  Clapacs, Madie Reno, MD Inpatient   03/09/2016 0430 03/09/2016 2231 Full Code [id number]  Saundra Shelling, MD Inpatient       TOTAL TIME TAKING CARE OF THIS PATIENT: 35 minutes.    Loletha Grayer M.D on 04/19/2021 at 2:29 PM  Between 7am to 6pm - Pager - [phone]  After 6pm go to [email or web address] - password [email or web address]  [facility]  CC: Primary care physician; [name], MD

## 2021-04-19 NOTE — TOC Transition Note (Signed)
Transition of Care [facility]) - CM/SW Discharge Note   Patient Details  Name: HILARY MILKS MRN: [id number] Date of Birth: 04/13/1946  [facility] CM/SW Contact:  Shelbie Hutching, RN Phone Number: 04/19/2021, 12:00 PM   Clinical Narrative:    Patient admitted to the hospital with sepsis now medically cleared for discharge home with home health services.  Patient walks with a cane, she lives with her daughter who will be picking her up today.  Patient has used [facility] in the past.  Home health referral for RN, aide, and SW given to [place] with [facility].     Final next level of care: [place] Barriers to Discharge: Barriers Resolved   Patient Goals and CMS Choice Patient states their goals for this hospitalization and ongoing recovery are:: Would like to have home health services CMS Medicare.gov Compare Post Acute Care list provided to:: Patient Choice offered to / list presented to : Patient  Discharge Placement                       Discharge Plan and Services                DME Arranged: N/A DME Agency: NA       HH Arranged: RN, Nurse's Aide, Social Work [employer] Agency: [place] Date [place]: 04/19/21 Time [place]: 1200 Representative spoke with at [place]: [place]  Social Determinants of Health ([place]) Interventions     Readmission Risk Interventions No flowsheet data found.

## 2021-04-19 NOTE — Progress Notes (Signed)
Sabrina [name] to be D/C'd  per MD order.  Discussed with the patient and all questions fully answered.  VSS, Skin clean, dry and intact without evidence of skin break down, no evidence of skin tears noted.  IV catheter discontinued intact. Site without signs and symptoms of complications. Dressing and pressure applied.  An After Visit Summary was printed and given to the patient. Patient received prescription.  D/c education completed with patient/family including follow up instructions, medication list, d/c activities limitations if indicated, with other d/c instructions as indicated by MD - patient able to verbalize understanding, all questions fully answered.   Patient instructed to return to ED, call 911, or call MD for any changes in condition.   Patient to be escorted via [place], and D/C home via private auto.

## 2021-04-19 NOTE — Discharge Instructions (Addendum)
Hold medications for diabetes at this point Hold Cholesterol medication at this point Hold BP meds, except for spironolactone Avoid sun exposure while on doxycycline

## 2021-04-21 LAB — CULTURE, BLOOD (ROUTINE X 2)
Culture: NO GROWTH
Culture: NO GROWTH

## 2021-04-23 DIAGNOSIS — E119 Type 2 diabetes mellitus without complications: Secondary | ICD-10-CM | POA: Diagnosis not present

## 2021-04-23 DIAGNOSIS — I10 Essential (primary) hypertension: Secondary | ICD-10-CM | POA: Diagnosis not present

## 2021-04-23 DIAGNOSIS — Z79899 Other long term (current) drug therapy: Secondary | ICD-10-CM | POA: Diagnosis not present

## 2021-04-23 DIAGNOSIS — F03918 Unspecified dementia, unspecified severity, with other behavioral disturbance: Secondary | ICD-10-CM | POA: Insufficient documentation

## 2021-05-05 ENCOUNTER — Other Ambulatory Visit: Payer: Self-pay | Admitting: Internal Medicine

## 2021-05-05 DIAGNOSIS — Z1231 Encounter for screening mammogram for malignant neoplasm of breast: Secondary | ICD-10-CM

## 2021-06-09 DIAGNOSIS — E538 Deficiency of other specified B group vitamins: Secondary | ICD-10-CM | POA: Diagnosis not present

## 2021-06-09 DIAGNOSIS — I10 Essential (primary) hypertension: Secondary | ICD-10-CM | POA: Diagnosis not present

## 2021-06-09 DIAGNOSIS — Z79899 Other long term (current) drug therapy: Secondary | ICD-10-CM | POA: Diagnosis not present

## 2021-06-09 DIAGNOSIS — E782 Mixed hyperlipidemia: Secondary | ICD-10-CM | POA: Diagnosis not present

## 2021-06-09 DIAGNOSIS — E119 Type 2 diabetes mellitus without complications: Secondary | ICD-10-CM | POA: Diagnosis not present

## 2021-06-09 DIAGNOSIS — Z794 Long term (current) use of insulin: Secondary | ICD-10-CM | POA: Diagnosis not present

## 2021-06-12 ENCOUNTER — Ambulatory Visit
Admission: RE | Admit: 2021-06-12 | Discharge: 2021-06-12 | Disposition: A | Discharge status: Home / Self Care | Payer: Medicare HMO | Source: Ambulatory Visit | Attending: Internal Medicine | Admitting: Internal Medicine

## 2021-06-12 ENCOUNTER — Other Ambulatory Visit: Payer: Self-pay

## 2021-06-12 DIAGNOSIS — Z1231 Encounter for screening mammogram for malignant neoplasm of breast: Secondary | ICD-10-CM | POA: Diagnosis not present

## 2021-09-02 DIAGNOSIS — E782 Mixed hyperlipidemia: Secondary | ICD-10-CM | POA: Diagnosis not present

## 2021-09-02 DIAGNOSIS — Z79899 Other long term (current) drug therapy: Secondary | ICD-10-CM | POA: Diagnosis not present

## 2021-09-02 DIAGNOSIS — E118 Type 2 diabetes mellitus with unspecified complications: Secondary | ICD-10-CM | POA: Diagnosis not present

## 2021-09-02 DIAGNOSIS — R8290 Unspecified abnormal findings in urine: Secondary | ICD-10-CM | POA: Diagnosis not present

## 2021-09-02 DIAGNOSIS — I10 Essential (primary) hypertension: Secondary | ICD-10-CM | POA: Diagnosis not present

## 2021-09-04 DIAGNOSIS — E113393 Type 2 diabetes mellitus with moderate nonproliferative diabetic retinopathy without macular edema, bilateral: Secondary | ICD-10-CM | POA: Diagnosis not present

## 2021-09-09 DIAGNOSIS — Z794 Long term (current) use of insulin: Secondary | ICD-10-CM | POA: Diagnosis not present

## 2021-09-09 DIAGNOSIS — I10 Essential (primary) hypertension: Secondary | ICD-10-CM | POA: Diagnosis not present

## 2021-09-09 DIAGNOSIS — N390 Urinary tract infection, site not specified: Secondary | ICD-10-CM | POA: Diagnosis not present

## 2021-09-09 DIAGNOSIS — F03918 Unspecified dementia, unspecified severity, with other behavioral disturbance: Secondary | ICD-10-CM | POA: Diagnosis not present

## 2021-09-09 DIAGNOSIS — E782 Mixed hyperlipidemia: Secondary | ICD-10-CM | POA: Diagnosis not present

## 2021-09-09 DIAGNOSIS — E119 Type 2 diabetes mellitus without complications: Secondary | ICD-10-CM | POA: Diagnosis not present

## 2021-09-23 DIAGNOSIS — N390 Urinary tract infection, site not specified: Secondary | ICD-10-CM | POA: Diagnosis not present

## 2021-11-24 ENCOUNTER — Encounter: Payer: Self-pay | Admitting: Urology

## 2021-11-24 ENCOUNTER — Ambulatory Visit: Payer: Medicare HMO | Admitting: Urology

## 2021-11-24 ENCOUNTER — Other Ambulatory Visit: Payer: Self-pay

## 2021-11-24 VITALS — BP 181/110 | HR 108 | Ht 62.0 in | Wt 149.0 lb

## 2021-11-24 DIAGNOSIS — E1122 Type 2 diabetes mellitus with diabetic chronic kidney disease: Secondary | ICD-10-CM | POA: Diagnosis not present

## 2021-11-24 DIAGNOSIS — I10 Essential (primary) hypertension: Secondary | ICD-10-CM | POA: Diagnosis not present

## 2021-11-24 DIAGNOSIS — N390 Urinary tract infection, site not specified: Secondary | ICD-10-CM

## 2021-11-24 DIAGNOSIS — N183 Chronic kidney disease, stage 3 unspecified: Secondary | ICD-10-CM | POA: Diagnosis not present

## 2021-11-24 DIAGNOSIS — N8111 Cystocele, midline: Secondary | ICD-10-CM

## 2021-11-24 LAB — MICROSCOPIC EXAMINATION: Bacteria, UA: NONE SEEN

## 2021-11-24 LAB — URINALYSIS, COMPLETE
Bilirubin, UA: NEGATIVE
Ketones, UA: NEGATIVE
Leukocytes,UA: NEGATIVE
Nitrite, UA: NEGATIVE
RBC, UA: NEGATIVE
Specific Gravity, UA: 1.025 (ref 1.005–1.030)
Urobilinogen, Ur: 0.2 mg/dL (ref 0.2–1.0)
pH, UA: 5.5 (ref 5.0–7.5)

## 2021-11-24 NOTE — Progress Notes (Signed)
11/24/2021 10:09 AM   Sabrina [name] 1946/01/03 [id number]  Referring provider: [name], MD [facility] [place],  [place] [address]  Chief Complaint  Patient presents with   Recurrent UTI    HPI: I was consulted to assess the patient for apparent prolapse.  The history was challenging.  It sounds like she was going to have a bladder repair and hysterectomy more than a year ago but it was canceled.  At baseline she voids every 3-4 hours gets up twice a night.  I believe she has urge incontinence especially when she goes from a sitting to standing position.  She says she changes her pad every 3 hours but was not specific on how what they are.  No bedwetting  She used to be on insulin but now pills for diabetes.  She had a stroke a year ago  In June 2022 she was admitted with sepsis.There was a question about endometrial cancer and Dr. Leafy Ro talked her about endometrial biopsy.  She did not want to stop her Plavix.  She saw Dr. Enzo Bi in February 2019.  She had been referred to a tertiary center but there were insurance issues.  She had been scheduled for hysterectomy and anterior repair.  Its not clear why the surgery was canceled but soon after think she had a stroke.  She had a CT scan in June 2022.  Normal kidneys.  She had an abnormal appearance of the uterus with prominent fluid distention in the endometrial cavity with surface nodularity.  The findings were concerning for malignancy.    There was question that she might be having bladder infections but by history she is not.  Last urine culture negative  Details were difficult but I think she is back on Plavix but she did not fill the prescription in the last week.  No history of kidney stones bladder surgery or bladder infections.  Bowel movements normal.     PMH: Past Medical History:  Diagnosis Date   Diabetes mellitus without complication ([place])    High cholesterol     Hypertension     Surgical History: Past Surgical History:  Procedure Laterality Date   DILATION AND CURETTAGE OF UTERUS     POLYPECTOMY      Home Medications:  Allergies as of 11/24/2021       Reactions   Glimepiride Diarrhea, Other (See Comments)   Glipizide Other (See Comments)   Lisinopril Other (See Comments)   headache   Losartan Other (See Comments)   Headache   Norco [hydrocodone-acetaminophen] Other (See Comments)   Mood changes   Pioglitazone Swelling   On legs        Medication List        Accurate as of November 24, 2021 10:09 AM. If you have any questions, ask your nurse or doctor.          Aspirin Low Dose 81 MG EC tablet Generic drug: aspirin Take 81 mg by mouth daily.   clopidogrel 75 MG tablet Commonly known as: PLAVIX Take 75 mg by mouth daily.   Geritol Complete Tabs Take 1 tablet by mouth See admin instructions. Take 1 tablet by mouth two to three times a week.   spironolactone 25 MG tablet Commonly known as: ALDACTONE Take 25 mg by mouth daily.   trolamine salicylate 10 % cream Commonly known as: ASPERCREME Apply 1 application topically as needed for muscle pain.   vitamin B-12 100 MCG tablet  Commonly known as: CYANOCOBALAMIN Take 100 mcg by mouth daily.        Allergies:  Allergies  Allergen Reactions   Glimepiride Diarrhea and Other (See Comments)   Glipizide Other (See Comments)   Lisinopril Other (See Comments)    headache   Losartan Other (See Comments)    Headache   Norco [Hydrocodone-Acetaminophen] Other (See Comments)    Mood changes   Pioglitazone Swelling    On legs    Family History: Family History  Problem Relation Age of Onset   Breast cancer Maternal Aunt        2 aunts in their 75's   Breast cancer Maternal Aunt 70   Ovarian cancer Neg Hx    Colon cancer Neg Hx     Social History:  reports that she has never smoked. She has never used smokeless tobacco. She reports that she does not drink  alcohol and does not use drugs.  ROS:                                        Physical Exam: There were no vitals taken for this visit.  Constitutional:  Alert and oriented, No acute distress. HEENT: [place] AT, moist mucus membranes.  Trachea midline, no masses. Cardiovascular: No clubbing, cyanosis, or edema. Respiratory: Normal respiratory effort, no increased work of breathing. GI: Abdomen is soft, nontender, nondistended, no abdominal masses GU: On pelvic examination patient cervix and uterus descended almost to the introitus.  With the apex reduced she had a small grade 2 cystocele with some shortening of the anterior vaginal wall.  There was not much central defect.  She had moderate atrophy.  With the prolapse reduced she had a grade 1 rectocele. Skin: No rashes, bruises or suspicious lesions. Lymph: No cervical or inguinal adenopathy. Neurologic: Grossly intact, no focal deficits, moving all 4 extremities. Psychiatric: Normal mood and affect.  Laboratory Data: Lab Results  Component Value Date   WBC 2.6 (L) 04/18/2021   HGB 11.5 (L) 04/18/2021   HCT 33.7 (L) 04/18/2021   MCV 88.5 04/18/2021   PLT 171 04/18/2021    Lab Results  Component Value Date   CREATININE 0.81 04/19/2021    No results found for: PSA  No results found for: TESTOSTERONE  Lab Results  Component Value Date   HGBA1C 7.1 (H) 04/16/2021    Urinalysis    Component Value Date/Time   COLORURINE AMBER (A) 04/16/2021 1515   APPEARANCEUR CLOUDY (A) 04/16/2021 1515   LABSPEC 1.008 04/16/2021 1515   PHURINE 5.0 04/16/2021 1515   GLUCOSEU NEGATIVE 04/16/2021 1515   HGBUR MODERATE (A) 04/16/2021 1515   BILIRUBINUR NEGATIVE 04/16/2021 1515   KETONESUR NEGATIVE 04/16/2021 1515   PROTEINUR 100 (A) 04/16/2021 1515   NITRITE POSITIVE (A) 04/16/2021 1515   LEUKOCYTESUR SMALL (A) 04/16/2021 1515    Pertinent Imaging: Chart reviewed  Assessment & Plan: Picture drawn.  Patient has  uterine prolapse.  My concern is that she may have a malignancy.  Regarding prolapse the treatment would be a pessary or robotic hysterectomy with vault suspension.  Of course if she has cancer of the treatment may differ.  It was difficult to sort out why she did not follow-up with gynecology.  The patient on a few occasions noted that she was not pleased although she was disappointed with all the tests she is going through and that her surgery  was canceled in the past and she does not know the reason why.  She said she does not want more testing or surgery.  She says To follow-up with Dr. Doy Hutching in 3 weeks and I will send a copy of this note to Dr. Doy Hutching.  I urged her to see gynecology and she understands that she could have uterine cancer.  She understands I do not perform the surgery.  Perhaps Dr. Doy Hutching can reiterate to her the importance of this.  She said she would rather be left in peace.  There are no diagnoses linked to this encounter.  No follow-ups on file.  Reece Packer, MD  [facility] [address] [place], [place] [address] [phone]

## 2021-12-11 DIAGNOSIS — E1165 Type 2 diabetes mellitus with hyperglycemia: Secondary | ICD-10-CM | POA: Diagnosis not present

## 2021-12-11 DIAGNOSIS — Z794 Long term (current) use of insulin: Secondary | ICD-10-CM | POA: Diagnosis not present

## 2021-12-11 DIAGNOSIS — Z79899 Other long term (current) drug therapy: Secondary | ICD-10-CM | POA: Diagnosis not present

## 2021-12-11 DIAGNOSIS — I10 Essential (primary) hypertension: Secondary | ICD-10-CM | POA: Diagnosis not present

## 2022-01-04 ENCOUNTER — Encounter: Payer: Self-pay | Admitting: Emergency Medicine

## 2022-01-04 ENCOUNTER — Emergency Department
Admission: EM | Admit: 2022-01-04 | Discharge: 2022-01-04 | Disposition: A | Discharge status: Home / Self Care | Payer: Medicare HMO | Source: Home / Self Care | Attending: Emergency Medicine | Admitting: Emergency Medicine

## 2022-01-04 ENCOUNTER — Other Ambulatory Visit: Payer: Self-pay

## 2022-01-04 DIAGNOSIS — Z803 Family history of malignant neoplasm of breast: Secondary | ICD-10-CM | POA: Diagnosis not present

## 2022-01-04 DIAGNOSIS — R109 Unspecified abdominal pain: Secondary | ICD-10-CM | POA: Insufficient documentation

## 2022-01-04 DIAGNOSIS — G319 Degenerative disease of nervous system, unspecified: Secondary | ICD-10-CM | POA: Diagnosis not present

## 2022-01-04 DIAGNOSIS — I129 Hypertensive chronic kidney disease with stage 1 through stage 4 chronic kidney disease, or unspecified chronic kidney disease: Secondary | ICD-10-CM | POA: Diagnosis present

## 2022-01-04 DIAGNOSIS — I639 Cerebral infarction, unspecified: Secondary | ICD-10-CM | POA: Diagnosis not present

## 2022-01-04 DIAGNOSIS — E7800 Pure hypercholesterolemia, unspecified: Secondary | ICD-10-CM | POA: Diagnosis present

## 2022-01-04 DIAGNOSIS — E1122 Type 2 diabetes mellitus with diabetic chronic kidney disease: Secondary | ICD-10-CM | POA: Diagnosis present

## 2022-01-04 DIAGNOSIS — K219 Gastro-esophageal reflux disease without esophagitis: Secondary | ICD-10-CM | POA: Diagnosis present

## 2022-01-04 DIAGNOSIS — N179 Acute kidney failure, unspecified: Secondary | ICD-10-CM | POA: Diagnosis present

## 2022-01-04 DIAGNOSIS — I10 Essential (primary) hypertension: Secondary | ICD-10-CM | POA: Diagnosis not present

## 2022-01-04 DIAGNOSIS — F0390 Unspecified dementia without behavioral disturbance: Secondary | ICD-10-CM | POA: Insufficient documentation

## 2022-01-04 DIAGNOSIS — E119 Type 2 diabetes mellitus without complications: Secondary | ICD-10-CM | POA: Diagnosis not present

## 2022-01-04 DIAGNOSIS — K449 Diaphragmatic hernia without obstruction or gangrene: Secondary | ICD-10-CM | POA: Diagnosis not present

## 2022-01-04 DIAGNOSIS — E1169 Type 2 diabetes mellitus with other specified complication: Secondary | ICD-10-CM | POA: Diagnosis present

## 2022-01-04 DIAGNOSIS — R0902 Hypoxemia: Secondary | ICD-10-CM | POA: Diagnosis present

## 2022-01-04 DIAGNOSIS — E785 Hyperlipidemia, unspecified: Secondary | ICD-10-CM | POA: Diagnosis not present

## 2022-01-04 DIAGNOSIS — R188 Other ascites: Secondary | ICD-10-CM | POA: Diagnosis not present

## 2022-01-04 DIAGNOSIS — R55 Syncope and collapse: Secondary | ICD-10-CM | POA: Diagnosis present

## 2022-01-04 DIAGNOSIS — R112 Nausea with vomiting, unspecified: Secondary | ICD-10-CM | POA: Insufficient documentation

## 2022-01-04 DIAGNOSIS — I2692 Saddle embolus of pulmonary artery without acute cor pulmonale: Secondary | ICD-10-CM | POA: Diagnosis present

## 2022-01-04 DIAGNOSIS — R531 Weakness: Secondary | ICD-10-CM | POA: Insufficient documentation

## 2022-01-04 DIAGNOSIS — D75839 Thrombocytosis, unspecified: Secondary | ICD-10-CM | POA: Diagnosis present

## 2022-01-04 DIAGNOSIS — N939 Abnormal uterine and vaginal bleeding, unspecified: Secondary | ICD-10-CM | POA: Diagnosis present

## 2022-01-04 DIAGNOSIS — R9389 Abnormal findings on diagnostic imaging of other specified body structures: Secondary | ICD-10-CM | POA: Diagnosis present

## 2022-01-04 DIAGNOSIS — E860 Dehydration: Secondary | ICD-10-CM | POA: Diagnosis present

## 2022-01-04 DIAGNOSIS — R110 Nausea: Secondary | ICD-10-CM | POA: Diagnosis not present

## 2022-01-04 DIAGNOSIS — Z885 Allergy status to narcotic agent status: Secondary | ICD-10-CM | POA: Diagnosis not present

## 2022-01-04 DIAGNOSIS — F0393 Unspecified dementia, unspecified severity, with mood disturbance: Secondary | ICD-10-CM | POA: Diagnosis present

## 2022-01-04 DIAGNOSIS — I248 Other forms of acute ischemic heart disease: Secondary | ICD-10-CM | POA: Diagnosis present

## 2022-01-04 DIAGNOSIS — I6782 Cerebral ischemia: Secondary | ICD-10-CM | POA: Diagnosis not present

## 2022-01-04 DIAGNOSIS — Z20822 Contact with and (suspected) exposure to covid-19: Secondary | ICD-10-CM | POA: Diagnosis present

## 2022-01-04 DIAGNOSIS — I2699 Other pulmonary embolism without acute cor pulmonale: Secondary | ICD-10-CM | POA: Diagnosis not present

## 2022-01-04 DIAGNOSIS — N189 Chronic kidney disease, unspecified: Secondary | ICD-10-CM | POA: Diagnosis not present

## 2022-01-04 DIAGNOSIS — R778 Other specified abnormalities of plasma proteins: Secondary | ICD-10-CM | POA: Diagnosis not present

## 2022-01-04 DIAGNOSIS — Z8673 Personal history of transient ischemic attack (TIA), and cerebral infarction without residual deficits: Secondary | ICD-10-CM | POA: Diagnosis not present

## 2022-01-04 DIAGNOSIS — Z7982 Long term (current) use of aspirin: Secondary | ICD-10-CM | POA: Diagnosis not present

## 2022-01-04 DIAGNOSIS — I2609 Other pulmonary embolism with acute cor pulmonale: Secondary | ICD-10-CM | POA: Diagnosis not present

## 2022-01-04 DIAGNOSIS — R739 Hyperglycemia, unspecified: Secondary | ICD-10-CM | POA: Diagnosis not present

## 2022-01-04 DIAGNOSIS — R1111 Vomiting without nausea: Secondary | ICD-10-CM | POA: Diagnosis not present

## 2022-01-04 DIAGNOSIS — J90 Pleural effusion, not elsewhere classified: Secondary | ICD-10-CM | POA: Diagnosis not present

## 2022-01-04 DIAGNOSIS — N1831 Chronic kidney disease, stage 3a: Secondary | ICD-10-CM | POA: Diagnosis present

## 2022-01-04 DIAGNOSIS — I2602 Saddle embolus of pulmonary artery with acute cor pulmonale: Secondary | ICD-10-CM | POA: Diagnosis not present

## 2022-01-04 DIAGNOSIS — F319 Bipolar disorder, unspecified: Secondary | ICD-10-CM | POA: Diagnosis present

## 2022-01-04 DIAGNOSIS — S0990XA Unspecified injury of head, initial encounter: Secondary | ICD-10-CM | POA: Diagnosis not present

## 2022-01-04 DIAGNOSIS — E871 Hypo-osmolality and hyponatremia: Secondary | ICD-10-CM | POA: Diagnosis not present

## 2022-01-04 LAB — COMPREHENSIVE METABOLIC PANEL
ALT: 15 U/L (ref 0–44)
AST: 25 U/L (ref 15–41)
Albumin: 3.3 g/dL — ABNORMAL LOW (ref 3.5–5.0)
Alkaline Phosphatase: 59 U/L (ref 38–126)
Anion gap: 13 (ref 5–15)
BUN: 16 mg/dL (ref 8–23)
CO2: 23 mmol/L (ref 22–32)
Calcium: 9.3 mg/dL (ref 8.9–10.3)
Chloride: 101 mmol/L (ref 98–111)
Creatinine, Ser: 1.04 mg/dL — ABNORMAL HIGH (ref 0.44–1.00)
GFR, Estimated: 56 mL/min — ABNORMAL LOW (ref >60)
Glucose, Bld: 289 mg/dL — ABNORMAL HIGH (ref 70–99)
Potassium: 4.8 mmol/L (ref 3.5–5.1)
Sodium: 137 mmol/L (ref 135–145)
Total Bilirubin: 0.5 mg/dL (ref 0.3–1.2)
Total Protein: 7.4 g/dL (ref 6.5–8.1)

## 2022-01-04 LAB — URINALYSIS, ROUTINE W REFLEX MICROSCOPIC
Bacteria, UA: NONE SEEN
Bilirubin Urine: NEGATIVE
Glucose, UA: >=500 mg/dL — AB
Hgb urine dipstick: NEGATIVE
Ketones, ur: 5 mg/dL — AB
Nitrite: NEGATIVE
Protein, ur: 30 mg/dL — AB
Specific Gravity, Urine: 1.025 (ref 1.005–1.030)
pH: 5 (ref 5.0–8.0)

## 2022-01-04 LAB — LIPASE, BLOOD: Lipase: 42 U/L (ref 11–51)

## 2022-01-04 LAB — CBC
HCT: 38.3 % (ref 36.0–46.0)
Hemoglobin: 11.9 g/dL — ABNORMAL LOW (ref 12.0–15.0)
MCH: 28.1 pg (ref 26.0–34.0)
MCHC: 31.1 g/dL (ref 30.0–36.0)
MCV: 90.3 fL (ref 80.0–100.0)
Platelets: 454 10*3/uL — ABNORMAL HIGH (ref 150–400)
RBC: 4.24 MIL/uL (ref 3.87–5.11)
RDW: 12.6 % (ref 11.5–15.5)
WBC: 6.9 10*3/uL (ref 4.0–10.5)
nRBC: 0 % (ref 0.0–0.2)

## 2022-01-04 MED ORDER — ONDANSETRON HCL 4 MG/2ML IJ SOLN
4.0000 mg | Freq: Once | INTRAMUSCULAR | Status: AC
  Administered 2022-01-04: 4 mg via INTRAVENOUS
  Filled 2022-01-04: qty 2

## 2022-01-04 MED ORDER — ONDANSETRON 4 MG PO TBDP: 4.0000 mg | ORAL_TABLET | Freq: Three times a day (TID) | ORAL | 0 refills | Status: DC | PRN

## 2022-01-04 MED ORDER — SODIUM CHLORIDE 0.9 % IV BOLUS
500.0000 mL | Freq: Once | INTRAVENOUS | Status: AC
  Administered 2022-01-04: 500 mL via INTRAVENOUS

## 2022-01-04 NOTE — ED Provider Notes (Signed)
? ? ?[facility] ?Emergency Department Provider Note ? ? ? ? Event Date/Time  ? First MD Initiated Contact with Patient 01/04/22 1311   ?  (approximate) ? ? ?History  ? ?Abdominal Pain, Weakness, and Nausea ? ? ?HPI ? ?Sabrina [name] is a 76 y.o. female with a history of hypertension, CVA, dementia, bipolar disorder, type 2 diabetes and AKI, presents to the ED for evaluation of sudden onset of vomiting.  Patient noted yesterday she experienced some weakness but denies any frank fevers chills or sweats.  She denies any cough, congestion, dysuria, or hematuria.  She did today she had an episode of nonbloody, nonbilious emesis.  Patient denies any frank abdominal discomfort for chest pain.  She is afebrile on presentation, and presents to the ED for further evaluation of her symptoms. ?  ? ? ?Physical Exam  ? ?Triage Vital Signs: ?ED Triage Vitals  ?Enc Vitals Group  ?   BP 01/04/22 1302 121/77  ?   Pulse Rate 01/04/22 1302 62  ?   Resp 01/04/22 1302 19  ?   Temp 01/04/22 1302 98.1 ?F (36.7 ?C)  ?   Temp Source 01/04/22 1302 Axillary  ?   SpO2 01/04/22 1302 98 %  ?   Weight 01/04/22 1300 141 lb (64 kg)  ?   Height 01/04/22 1300 '5\' 2"'$  (1.575 m)  ?   Head Circumference --   ?   Peak Flow --   ?   Pain Score 01/04/22 1300 4  ?   Pain Loc --   ?   Pain Edu? --   ?   Excl. in [place]? --   ? ? ?Most recent vital signs: ?Vitals:  ? 01/04/22 1726 01/04/22 1758  ?BP: 128/77 130/80  ?Pulse: 66 65  ?Resp: 18 17  ?Temp: (!) 96.9 ?F (36.1 ?C) 98.4 ?F (36.9 ?C)  ?SpO2: 96% 98%  ? ? ?General Awake, no distress. Active and engaged ?CV:  Good peripheral perfusion. RRR ?RESP:  Normal effort. CTA ?ABD:  No distention. Soft, nontender ? ? ?ED Results / Procedures / Treatments  ? ?Labs ?(all labs ordered are listed, but only abnormal results are displayed) ?Labs Reviewed  ?CBC - Abnormal; Notable for the following components:  ?    Result Value  ? Hemoglobin 11.9 (*)   ? Platelets 454 (*)   ? All other components within  normal limits  ?URINALYSIS, ROUTINE W REFLEX MICROSCOPIC - Abnormal; Notable for the following components:  ? Color, Urine YELLOW (*)   ? APPearance HAZY (*)   ? Glucose, UA >=500 (*)   ? Ketones, ur 5 (*)   ? Protein, ur 30 (*)   ? Leukocytes,Ua SMALL (*)   ? All other components within normal limits  ?COMPREHENSIVE METABOLIC PANEL - Abnormal; Notable for the following components:  ? Glucose, Bld 289 (*)   ? Creatinine, Ser 1.04 (*)   ? Albumin 3.3 (*)   ? GFR, Estimated 56 (*)   ? All other components within normal limits  ?LIPASE, BLOOD  ?CBG MONITORING, ED  ? ? ? ?EKG ? ?Vent. rate 115 BPM ?PR interval 88 ms ?QRS duration 158 ms ?QT/QTcB 424/586 ms ?P-R-T axes * -51 -35 ?No STEMI ?Sinus tach with PVCs ? ?RADIOLOGY ? ? ?No results found. ? ? ?PROCEDURES: ? ?Critical Care performed: No ? ?Procedures ? ? ?MEDICATIONS ORDERED IN ED: ?Medications  ?ondansetron (ZOFRAN) injection 4 mg (4 mg Intravenous Given 01/04/22 1516)  ?sodium chloride  0.9 % bolus 500 mL (0 mLs Intravenous Stopped 01/04/22 1552)  ? ? ? ?IMPRESSION / MDM / ASSESSMENT AND PLAN / ED COURSE  ?I reviewed the triage vital signs and the nursing notes. ?             ?               ? ?Differential diagnosis includes, but is not limited to,  acute appendicitis, diverticulitis, urinary tract infection/pyelonephritis, endometriosis, bowel obstruction, colitis, renal colic, gastroenteritis, hernia,  ? ?The patient is on the cardiac monitor to evaluate for evidence of arrhythmia and/or significant heart rate changes. ? ?Patient's diagnosis is consistent with nausea and vomiting non-intractable in nature without associated fevers or chills.  No lab abnormalities including leukocytosis or electrolyte deficiencies.  No UA evidence of an acute UTI at this time.  Patient has remained stable and afebrile during her course.  She reports improvement of her symptoms after IV fluid and antiemetic medication administration.  Exam is reassuring as she has a benign belly  exam and no other concern for sources of infection.  No indication at this time for admission for further interventions at this time. Patient will be discharged home with prescriptions for Zofran. Patient is to follow up with primary provider as needed or otherwise directed. Patient is given ED precautions to return to the ED for any worsening or new symptoms. ? ? ? ?FINAL CLINICAL IMPRESSION(S) / ED DIAGNOSES  ? ?Final diagnoses:  ?Nausea and vomiting, unspecified vomiting type  ?Weakness  ? ? ? ?Rx / DC Orders  ? ?ED Discharge Orders   ? ?      Ordered  ?  ondansetron (ZOFRAN-ODT) 4 MG disintegrating tablet  Every 8 hours PRN       ? 01/04/22 1736  ? ?  ?  ? ?  ? ? ? ?Note:  This document was prepared using Dragon voice recognition software and may include unintentional dictation errors. ? ?  ?Melvenia Needles, PA-C ?01/04/22 2114 ? ?  ?Lavonia Drafts, MD ?01/05/22 [phone] ? ?

## 2022-01-04 NOTE — ED Triage Notes (Signed)
Pt reports started getting weak yesterday and then today she started with vomiting.  ?

## 2022-01-04 NOTE — Discharge Instructions (Signed)
Sabrina [name] has a normal exam and work-up today. There is no evidence of a UTI. Continue to take your home meds and take the prescription nausea medicine as needed. Follow-up with Dr. Doy Hutching as scheduled. Return to the ED if needed.  ?

## 2022-01-05 ENCOUNTER — Emergency Department: Payer: Medicare HMO

## 2022-01-05 ENCOUNTER — Inpatient Hospital Stay
Admission: EM | Admit: 2022-01-05 | Discharge: 2022-01-11 | DRG: 164 | Disposition: A | Discharge status: Home Health | Payer: Medicare HMO | Attending: Internal Medicine | Admitting: Internal Medicine

## 2022-01-05 ENCOUNTER — Inpatient Hospital Stay: Payer: Medicare HMO

## 2022-01-05 ENCOUNTER — Other Ambulatory Visit: Payer: Self-pay

## 2022-01-05 DIAGNOSIS — F319 Bipolar disorder, unspecified: Secondary | ICD-10-CM | POA: Diagnosis present

## 2022-01-05 DIAGNOSIS — Z20822 Contact with and (suspected) exposure to covid-19: Secondary | ICD-10-CM | POA: Diagnosis present

## 2022-01-05 DIAGNOSIS — E119 Type 2 diabetes mellitus without complications: Secondary | ICD-10-CM | POA: Diagnosis not present

## 2022-01-05 DIAGNOSIS — R9389 Abnormal findings on diagnostic imaging of other specified body structures: Secondary | ICD-10-CM | POA: Diagnosis not present

## 2022-01-05 DIAGNOSIS — N939 Abnormal uterine and vaginal bleeding, unspecified: Secondary | ICD-10-CM | POA: Diagnosis present

## 2022-01-05 DIAGNOSIS — K219 Gastro-esophageal reflux disease without esophagitis: Secondary | ICD-10-CM | POA: Diagnosis present

## 2022-01-05 DIAGNOSIS — E1122 Type 2 diabetes mellitus with diabetic chronic kidney disease: Secondary | ICD-10-CM | POA: Diagnosis present

## 2022-01-05 DIAGNOSIS — J90 Pleural effusion, not elsewhere classified: Secondary | ICD-10-CM | POA: Diagnosis not present

## 2022-01-05 DIAGNOSIS — Z8673 Personal history of transient ischemic attack (TIA), and cerebral infarction without residual deficits: Secondary | ICD-10-CM

## 2022-01-05 DIAGNOSIS — I2602 Saddle embolus of pulmonary artery with acute cor pulmonale: Secondary | ICD-10-CM | POA: Diagnosis not present

## 2022-01-05 DIAGNOSIS — Z885 Allergy status to narcotic agent status: Secondary | ICD-10-CM | POA: Diagnosis not present

## 2022-01-05 DIAGNOSIS — Z7982 Long term (current) use of aspirin: Secondary | ICD-10-CM | POA: Diagnosis not present

## 2022-01-05 DIAGNOSIS — I2692 Saddle embolus of pulmonary artery without acute cor pulmonale: Secondary | ICD-10-CM | POA: Diagnosis present

## 2022-01-05 DIAGNOSIS — Z888 Allergy status to other drugs, medicaments and biological substances status: Secondary | ICD-10-CM

## 2022-01-05 DIAGNOSIS — I129 Hypertensive chronic kidney disease with stage 1 through stage 4 chronic kidney disease, or unspecified chronic kidney disease: Secondary | ICD-10-CM | POA: Diagnosis present

## 2022-01-05 DIAGNOSIS — R55 Syncope and collapse: Secondary | ICD-10-CM | POA: Diagnosis present

## 2022-01-05 DIAGNOSIS — R0902 Hypoxemia: Secondary | ICD-10-CM | POA: Diagnosis present

## 2022-01-05 DIAGNOSIS — Z803 Family history of malignant neoplasm of breast: Secondary | ICD-10-CM | POA: Diagnosis not present

## 2022-01-05 DIAGNOSIS — I10 Essential (primary) hypertension: Secondary | ICD-10-CM | POA: Diagnosis present

## 2022-01-05 DIAGNOSIS — I2699 Other pulmonary embolism without acute cor pulmonale: Secondary | ICD-10-CM

## 2022-01-05 DIAGNOSIS — E1169 Type 2 diabetes mellitus with other specified complication: Secondary | ICD-10-CM | POA: Diagnosis present

## 2022-01-05 DIAGNOSIS — E871 Hypo-osmolality and hyponatremia: Secondary | ICD-10-CM

## 2022-01-05 DIAGNOSIS — N179 Acute kidney failure, unspecified: Secondary | ICD-10-CM | POA: Diagnosis not present

## 2022-01-05 DIAGNOSIS — R112 Nausea with vomiting, unspecified: Secondary | ICD-10-CM | POA: Diagnosis present

## 2022-01-05 DIAGNOSIS — N189 Chronic kidney disease, unspecified: Secondary | ICD-10-CM | POA: Diagnosis not present

## 2022-01-05 DIAGNOSIS — N1831 Chronic kidney disease, stage 3a: Secondary | ICD-10-CM | POA: Diagnosis present

## 2022-01-05 DIAGNOSIS — E7800 Pure hypercholesterolemia, unspecified: Secondary | ICD-10-CM | POA: Diagnosis present

## 2022-01-05 DIAGNOSIS — R109 Unspecified abdominal pain: Secondary | ICD-10-CM | POA: Diagnosis present

## 2022-01-05 DIAGNOSIS — I639 Cerebral infarction, unspecified: Secondary | ICD-10-CM | POA: Diagnosis not present

## 2022-01-05 DIAGNOSIS — E785 Hyperlipidemia, unspecified: Secondary | ICD-10-CM | POA: Diagnosis not present

## 2022-01-05 DIAGNOSIS — K767 Hepatorenal syndrome: Secondary | ICD-10-CM

## 2022-01-05 DIAGNOSIS — F0393 Unspecified dementia, unspecified severity, with mood disturbance: Secondary | ICD-10-CM | POA: Diagnosis present

## 2022-01-05 DIAGNOSIS — Z79899 Other long term (current) drug therapy: Secondary | ICD-10-CM

## 2022-01-05 DIAGNOSIS — Z7902 Long term (current) use of antithrombotics/antiplatelets: Secondary | ICD-10-CM

## 2022-01-05 DIAGNOSIS — I2609 Other pulmonary embolism with acute cor pulmonale: Secondary | ICD-10-CM | POA: Diagnosis not present

## 2022-01-05 DIAGNOSIS — Z880 Allergy status to penicillin: Secondary | ICD-10-CM

## 2022-01-05 DIAGNOSIS — D75839 Thrombocytosis, unspecified: Secondary | ICD-10-CM | POA: Diagnosis present

## 2022-01-05 DIAGNOSIS — R778 Other specified abnormalities of plasma proteins: Secondary | ICD-10-CM | POA: Diagnosis present

## 2022-01-05 DIAGNOSIS — I248 Other forms of acute ischemic heart disease: Secondary | ICD-10-CM | POA: Diagnosis present

## 2022-01-05 DIAGNOSIS — K449 Diaphragmatic hernia without obstruction or gangrene: Secondary | ICD-10-CM | POA: Diagnosis not present

## 2022-01-05 DIAGNOSIS — R188 Other ascites: Secondary | ICD-10-CM | POA: Diagnosis not present

## 2022-01-05 DIAGNOSIS — E860 Dehydration: Secondary | ICD-10-CM | POA: Diagnosis present

## 2022-01-05 LAB — PROTIME-INR
INR: 1.2 (ref 0.8–1.2)
Prothrombin Time: 15.5 seconds — ABNORMAL HIGH (ref 11.4–15.2)

## 2022-01-05 LAB — CBC
HCT: 34.3 % — ABNORMAL LOW (ref 36.0–46.0)
Hemoglobin: 10.7 g/dL — ABNORMAL LOW (ref 12.0–15.0)
MCH: 27.9 pg (ref 26.0–34.0)
MCHC: 31.2 g/dL (ref 30.0–36.0)
MCV: 89.3 fL (ref 80.0–100.0)
Platelets: 437 10*3/uL — ABNORMAL HIGH (ref 150–400)
RBC: 3.84 MIL/uL — ABNORMAL LOW (ref 3.87–5.11)
RDW: 12.8 % (ref 11.5–15.5)
WBC: 7.8 10*3/uL (ref 4.0–10.5)
nRBC: 0 % (ref 0.0–0.2)

## 2022-01-05 LAB — COMPREHENSIVE METABOLIC PANEL
ALT: 13 U/L (ref 0–44)
AST: 20 U/L (ref 15–41)
Albumin: 3 g/dL — ABNORMAL LOW (ref 3.5–5.0)
Alkaline Phosphatase: 54 U/L (ref 38–126)
Anion gap: 11 (ref 5–15)
BUN: 18 mg/dL (ref 8–23)
CO2: 22 mmol/L (ref 22–32)
Calcium: 8.8 mg/dL — ABNORMAL LOW (ref 8.9–10.3)
Chloride: 104 mmol/L (ref 98–111)
Creatinine, Ser: 1.32 mg/dL — ABNORMAL HIGH (ref 0.44–1.00)
GFR, Estimated: 42 mL/min — ABNORMAL LOW (ref >60)
Glucose, Bld: 232 mg/dL — ABNORMAL HIGH (ref 70–99)
Potassium: 4.4 mmol/L (ref 3.5–5.1)
Sodium: 137 mmol/L (ref 135–145)
Total Bilirubin: 0.5 mg/dL (ref 0.3–1.2)
Total Protein: 6.6 g/dL (ref 6.5–8.1)

## 2022-01-05 LAB — MRSA NEXT GEN BY PCR, NASAL: MRSA by PCR Next Gen: NOT DETECTED

## 2022-01-05 LAB — RESP PANEL BY RT-PCR (FLU A&B, COVID) ARPGX2
Influenza A by PCR: NEGATIVE
Influenza B by PCR: NEGATIVE
SARS Coronavirus 2 by RT PCR: NEGATIVE

## 2022-01-05 LAB — APTT: aPTT: 29 seconds (ref 24–36)

## 2022-01-05 LAB — TROPONIN I (HIGH SENSITIVITY)
Troponin I (High Sensitivity): 269 ng/L (ref <18)
Troponin I (High Sensitivity): 332 ng/L (ref <18)
Troponin I (High Sensitivity): 420 ng/L (ref <18)

## 2022-01-05 LAB — GLUCOSE, CAPILLARY: Glucose-Capillary: 157 mg/dL — ABNORMAL HIGH (ref 70–99)

## 2022-01-05 LAB — BRAIN NATRIURETIC PEPTIDE: B Natriuretic Peptide: 682.7 pg/mL — ABNORMAL HIGH (ref 0.0–100.0)

## 2022-01-05 LAB — HEPARIN LEVEL (UNFRACTIONATED): Heparin Unfractionated: 1.07 IU/mL — ABNORMAL HIGH (ref 0.30–0.70)

## 2022-01-05 MED ORDER — INSULIN ASPART 100 UNIT/ML IJ SOLN: 0.0000 [IU] | Freq: Every day | INTRAMUSCULAR | Status: DC

## 2022-01-05 MED ORDER — ALBUTEROL SULFATE (2.5 MG/3ML) 0.083% IN NEBU
3.0000 mL | INHALATION_SOLUTION | RESPIRATORY_TRACT | Status: DC | PRN
Start: 2022-01-05 — End: 2022-01-11

## 2022-01-05 MED ORDER — ACETAMINOPHEN 325 MG PO TABS
650.0000 mg | ORAL_TABLET | Freq: Four times a day (QID) | ORAL | Status: DC | PRN
  Administered 2022-01-05 – 2022-01-11 (×9): 650 mg via ORAL
  Filled 2022-01-05 (×9): qty 2

## 2022-01-05 MED ORDER — DM-GUAIFENESIN ER 30-600 MG PO TB12
1.0000 | ORAL_TABLET | Freq: Two times a day (BID) | ORAL | Status: DC | PRN
Start: 2022-01-05 — End: 2022-01-11
  Filled 2022-01-05: qty 1

## 2022-01-05 MED ORDER — HEPARIN (PORCINE) 25000 UT/250ML-% IV SOLN
1150.0000 [IU]/h | INTRAVENOUS | Status: DC
  Administered 2022-01-05: 1150 [IU]/h via INTRAVENOUS
  Filled 2022-01-05: qty 250

## 2022-01-05 MED ORDER — ONDANSETRON HCL 4 MG/2ML IJ SOLN
4.0000 mg | Freq: Three times a day (TID) | INTRAMUSCULAR | Status: DC | PRN
Start: 2022-01-05 — End: 2022-01-08
  Administered 2022-01-05 – 2022-01-07 (×6): 4 mg via INTRAVENOUS
  Filled 2022-01-05 (×5): qty 2

## 2022-01-05 MED ORDER — SODIUM CHLORIDE 0.9 % IV SOLN
INTRAVENOUS | Status: DC
Start: 2022-01-05 — End: 2022-01-06

## 2022-01-05 MED ORDER — CHLORHEXIDINE GLUCONATE CLOTH 2 % EX PADS
6.0000 | MEDICATED_PAD | Freq: Every day | CUTANEOUS | Status: DC
  Administered 2022-01-05 – 2022-01-11 (×6): 6 via TOPICAL
  Filled 2022-01-05: qty 6

## 2022-01-05 MED ORDER — INSULIN ASPART 100 UNIT/ML IJ SOLN
0.0000 [IU] | Freq: Three times a day (TID) | INTRAMUSCULAR | Status: DC
  Administered 2022-01-06 – 2022-01-07 (×3): 1 [IU] via SUBCUTANEOUS
  Administered 2022-01-10: 2 [IU] via SUBCUTANEOUS
  Administered 2022-01-10: 1 [IU] via SUBCUTANEOUS
  Filled 2022-01-05 (×6): qty 1

## 2022-01-05 MED ORDER — SODIUM CHLORIDE 0.9 % IV BOLUS
1000.0000 mL | Freq: Once | INTRAVENOUS | Status: AC
  Administered 2022-01-05: 1000 mL via INTRAVENOUS

## 2022-01-05 MED ORDER — ATORVASTATIN CALCIUM 20 MG PO TABS
40.0000 mg | ORAL_TABLET | Freq: Every evening | ORAL | Status: DC
  Filled 2022-01-05 (×5): qty 2

## 2022-01-05 MED ORDER — HEPARIN BOLUS VIA INFUSION
5000.0000 [IU] | Freq: Once | INTRAVENOUS | Status: AC
  Administered 2022-01-05: 5000 [IU] via INTRAVENOUS
  Filled 2022-01-05: qty 5000

## 2022-01-05 MED ORDER — INSULIN GLARGINE-YFGN 100 UNIT/ML ~~LOC~~ SOLN
10.0000 [IU] | Freq: Every day | SUBCUTANEOUS | Status: DC
  Administered 2022-01-05 – 2022-01-09 (×5): 10 [IU] via SUBCUTANEOUS
  Filled 2022-01-05 (×6): qty 0.1

## 2022-01-05 MED ORDER — HYDRALAZINE HCL 20 MG/ML IJ SOLN: 5.0000 mg | INTRAMUSCULAR | Status: DC | PRN

## 2022-01-05 MED ORDER — IOHEXOL 350 MG/ML SOLN
60.0000 mL | Freq: Once | INTRAVENOUS | Status: AC | PRN
  Administered 2022-01-05: 60 mL via INTRAVENOUS

## 2022-01-05 MED ORDER — ASPIRIN 81 MG PO CHEW
324.0000 mg | CHEWABLE_TABLET | Freq: Once | ORAL | Status: AC
  Administered 2022-01-05: 324 mg via ORAL
  Filled 2022-01-05: qty 4

## 2022-01-05 MED ORDER — HEPARIN BOLUS VIA INFUSION
3800.0000 [IU] | Freq: Once | INTRAVENOUS | Status: DC
Start: 2022-01-05 — End: 2022-01-05
  Filled 2022-01-05: qty 3800

## 2022-01-05 NOTE — ED Notes (Signed)
Report given to Cameron, RN.

## 2022-01-05 NOTE — ED Provider Notes (Signed)
? ?[facility] ?Provider Note ? ? ? Event Date/Time  ? First MD Initiated Contact with Patient 01/05/22 1217   ?  (approximate) ? ?History  ? ?Chief Complaint: Loss of Consciousness ? ?HPI ? ?Sabrina [name] is a 76 y.o. female with a past medical history of diabetes, hypertension, hyperlipidemia, presents to the emergency department after syncopal event.  According to the patient she has not been eating or drinking much over the past 2 days.  Today she attempted to make some oatmeal and states she believes she passed out before the oatmeal was ready.  States she woke up on the floor.  Patient denies any pain and is not sure if she hit her head.  Denies any fever, cough, chest pain or abdominal pain.  Largely negative review of systems. ? ?Physical Exam  ? ?Triage Vital Signs: ?ED Triage Vitals  ?Enc Vitals Group  ?   BP 01/05/22 1220 119/80  ?   Pulse Rate 01/05/22 1220 (!) 110  ?   Resp 01/05/22 1220 19  ?   Temp 01/05/22 1220 97.6 ?F (36.4 ?C)  ?   Temp Source 01/05/22 1220 Oral  ?   SpO2 01/05/22 1215 98 %  ?   Weight 01/05/22 1216 140 lb 14 oz (63.9 kg)  ?   Height 01/05/22 1216 '5\' 2"'$  (1.575 m)  ?   Head Circumference --   ?   Peak Flow --   ?   Pain Score 01/05/22 1216 4  ?   Pain Loc --   ?   Pain Edu? --   ?   Excl. in [place]? --   ? ? ?Most recent vital signs: ?Vitals:  ? 01/05/22 1215 01/05/22 1220  ?BP:  119/80  ?Pulse:  (!) 110  ?Resp:  19  ?Temp:  97.6 ?F (36.4 ?C)  ?SpO2: 98% 97%  ? ? ?General: Awake, no distress.  ?CV:  Good peripheral perfusion.  Regular rate and rhythm  ?Resp:  Normal effort.  Equal breath sounds bilaterally.  ?Abd:  No distention.  Soft, nontender.  No rebound or guarding. ? ? ? ?ED Results / Procedures / Treatments  ? ?EKG ? ?EKG viewed and interpreted by myself shows sinus tachycardia 109 bpm with a narrow QRS, left axis deviation, largely normal intervals with nonspecific ST changes. ? ?RADIOLOGY ? ?I personally viewed the patient's CTA images.  Patient appears  to have large volume of clot including a saddle emboli. ?Radiology is read the CTA as bilateral pulmonary emboli with saddle emboli and signs of right heart strain.  Also with pleural effusions and ascites. ?CT scan of the head is negative. ? ? ?MEDICATIONS ORDERED IN ED: ?Medications  ?sodium chloride 0.9 % bolus 1,000 mL (has no administration in time range)  ? ? ? ?IMPRESSION / MDM / ASSESSMENT AND PLAN / ED COURSE  ?I reviewed the triage vital signs and the nursing notes. ? ?Patient presents to the emergency department after syncopal episode at home.  Overall patient appears well, no distress.  Patient states she was only on the floor briefly before her ex-husband found her.  Here the patient is awake alert able to give a good history.  We will check labs, IV hydrate obtain CT imaging of the head given possible head injury although patient denies head pain.  Patient was seen in the emergency department yesterday for nausea and vomiting which could be contributing to her symptoms today.  Patient receiving IV fluids and nausea  medication.  Differential is quite broad would include metabolic or electrolyte abnormality, dehydration, hypoglycemia, ACS, intracranial abnormality, infectious etiology such as UTI/COVID/flu. ? ?Patient's work-up shows an elevated troponin greater than 200.  Patient denies any chest pain or shortness of breath.  However patient is noted to be mildly tachycardic.  Given the patient's elevated troponin we will start on heparin I have also ordered a CTA of the chest to evaluate for possible pulmonary emboli.  Remainder the patient's lab work is largely Clifford.  Slight renal insufficiency otherwise reassuring chemistry.  Normal CBC with a normal white blood cell count. ? ?Patient CTA has resulted positive for pulmonary emboli including a saddle emboli and signs of right heart strain.  Patient again has no chest symptoms no chest pain or shortness of breath.  We will discuss with vascular  surgery for any possible intervention.  Patient will be admitted to the hospital service for further work-up and treatment.  I have ordered a heparin infusion for the patient. ? ?I spoke with Dr. Delana Meyer vascular surgery was states patient will likely require thrombectomy.  Patient mated to the hospital service. ? ?CRITICAL CARE ?Performed by: Harvest Dark ? ? ?Total critical care time: 45 minutes ? ?Critical care time was exclusive of separately billable procedures and treating other patients. ? ?Critical care was necessary to treat or prevent imminent or life-threatening deterioration. ? ?Critical care was time spent personally by me on the following activities: development of treatment plan with patient and/or surrogate as well as nursing, discussions with consultants, evaluation of patient's response to treatment, examination of patient, obtaining history from patient or surrogate, ordering and performing treatments and interventions, ordering and review of laboratory studies, ordering and review of radiographic studies, pulse oximetry and re-evaluation of patient's condition. ? ? ?FINAL CLINICAL IMPRESSION(S) / ED DIAGNOSES  ? ?Nausea ?Syncope ?Pulmonary embolus ?Elevated troponin ? ? ?Note:  This document was prepared using Dragon voice recognition software and may include unintentional dictation errors. ?  ?Harvest Dark, MD ?01/05/22 1544 ? ?

## 2022-01-05 NOTE — ED Notes (Signed)
Pt knows need for ua. Pt refused in and out cath and purewick. She stated she would let me know when she needs to go. Pt has call bell. ?

## 2022-01-05 NOTE — ED Triage Notes (Signed)
Pt arrived via EMS with 20 gauge in left AC. Pt received 562m in route. Pt states she got up to fix breakfast and took her metformin. Pt says she woke up later in the floor. Pt is unsure if she hit her head. Pt is on blood thinner.Pt had nausea and vomiting on the way here and according to EMS was seen here for same yesterday. Pt has not been able to eat in 2 days due to vomiting. ? ?

## 2022-01-05 NOTE — Consult Note (Addendum)
ANTICOAGULATION CONSULT NOTE - Initial Consult ? ?Pharmacy Consult for heparin ?Indication: Pulmonary Embolsim ? ?Allergies  ?Allergen Reactions  ? Glimepiride Diarrhea and Other (See Comments)  ? Glipizide Other (See Comments)  ? Lisinopril Other (See Comments)  ?  headache  ? Losartan Other (See Comments)  ?  Headache  ? Norco [Hydrocodone-Acetaminophen] Other (See Comments)  ?  Mood changes  ? Pioglitazone Swelling  ?  On legs  ? Penicillins Diarrhea and Rash  ? ? ?Patient Measurements: ?Height: '5\' 2"'$  (157.5 cm) ?Weight: 63.9 kg (140 lb 14 oz) ?IBW/kg (Calculated) : 50.1 ?Heparin Dosing Weight: 63 kg ? ?Vital Signs: ?Temp: 97.6 ?F (36.4 ?C) (03/13 1220) ?Temp Source: Oral (03/13 1220) ?BP: 119/80 (03/13 1220) ?Pulse Rate: 110 (03/13 1220) ? ?Labs: ?Recent Labs  ?  01/04/22 ?1303 01/04/22 ?1318 01/05/22 ?1226 01/05/22 ?1249  ?HGB 11.9*  --  10.7*  --   ?HCT 38.3  --  34.3*  --   ?PLT 454*  --  437*  --   ?CREATININE  --  1.04*  --  1.32*  ?TROPONINIHS  --   --  269*  --   ? ? ?Estimated Creatinine Clearance: 32.3 mL/min (A) (by C-G formula based on SCr of 1.32 mg/dL (H)). ? ? ?Medical History: ?Past Medical History:  ?Diagnosis Date  ? Diabetes mellitus without complication ([place])   ? High cholesterol   ? Hypertension   ? ? ?Medications:  ?PTA: Clopidogrel '75mg'$  PO QD ?Inpatient: Heparin infusion (3/13 >>> )  ?Allergies: No AC/APT related allergies ? ?Assessment: ?76yo female with past medical history of hyperlipidemia, history of CVA, and hypertension presents to ED with abdominal pain weakness, and nausea. Cardiac high sensitivity tropinins elevated at 269 ng/L. Pharmacy consulted for management of heparin drip in the setting of pulmonary embolism ? ?CTA Chest: Acute pulmonary embolism with large thrombus burden. There is saddle ?embolus in the right and left main pulmonary arteries. Acute right ?ventricular strain.  ? ?Date Time aPTT/HL Rate/Comment ?     ? ?Baseline Labs: ?aPTT - pending ?Hgb - 10.7 ?Plts -  437 ? ?Goal of Therapy:  ?Heparin level 0.3-0.7 units/ml ?Monitor platelets by anticoagulation protocol: Yes ?  ?Plan:  ?Give heparin IV 5000 units bolus x1; then start heparin infusion at 1150 units/hr ?Check anti-Xa level in 8 hours and daily once consecutively therapeutic. ?Continue to monitor H&H and platelets daily while on heparin gtt. ? ?Darrick Penna, PharmD, MS PGPM ?Clinical Pharmacist ?01/05/2022 ?3:00 PM ? ? ? ?

## 2022-01-05 NOTE — H&P (Signed)
History and Physical    [name] YIF:[id number] DOB: [birth year] DOA: 01/05/2022  Referring MD/NP/PA:   PCP: [name], MD   Patient coming from:  The patient is coming from home.  At baseline, pt is independent for most of ADL.        Chief Complaint: syncope  HPI: Sabrina [name] is a 76 y.o. female with medical history significant of hypertension, hyperlipidemia, diabetes mellitus, stroke, bipolar disorder COVID-19 infection 12/08/2020, who presents with syncope.  Pt was seen in ED yesterday due to nausea, vomiting and decreased oral intake for several days.  Patient had normal lipase and negative work-up in ED. Today patient passed out in kitchen when she was make some oatmeal per her daughter. She woke up on the floor.  Patient denies unilateral numbness or tinglings in extremities.  No facial droop or slurred speech.  Patient has generalized weakness. Denies chest pain, cough, shortness breath.  No fever or chills.  Denies diarrhea or abdominal pain.  No symptoms of UTI.  No dark stool or rectal bleeding.   Data Reviewed and ED Course: pt was found to have WBC 7.8, troponin level 269, pending COVID PCR, AKI with creatinine 1.32, BUN 18, GFR 42 (creatinine 0.81 on 04/19/2021), temperature normal, blood pressure 119/80, heart rate of 110, 19, oxygen saturation 97% on room air.  CT of head is negative for acute intracranial abnormalities.  Patient is placed on telemetry bed for observation.  CTA: Acute pulmonary embolism with large thrombus burden. There is saddle embolus in the right and left main pulmonary arteries. Acute right ventricular strain.   Small bilateral pleural effusions, more so on the right side. Linear patchy infiltrates are seen in the lower lung fields, more so on the right side suggesting subsegmental atelectasis/pneumonia.   There is moderate to large ascites in the upper abdomen.  EKG: I have personally reviewed.  Sinus rhythm, QTc 443, bifascicular  block, low voltage.   Review of Systems:   General: no fevers, chills, no body weight gain, has poor appetite, has fatigue HEENT: no blurry vision, hearing changes or sore throat Respiratory: no dyspnea, coughing, wheezing CV: no chest pain, no palpitations GI: has nausea, vomiting, no abdominal pain, diarrhea, constipation GU: no dysuria, burning on urination, increased urinary frequency, hematuria  Ext: no leg edema Neuro: no unilateral weakness, numbness, or tingling, no vision change or hearing loss Skin: no rash, no skin tear. MSK: No muscle spasm, no deformity, no limitation of range of movement in spin Heme: No easy bruising.  Travel history: No recent long distant travel.   Allergy:  Allergies  Allergen Reactions   Glimepiride Diarrhea and Other (See Comments)   Glipizide Other (See Comments)   Lisinopril Other (See Comments)    headache   Losartan Other (See Comments)    Headache   Norco [Hydrocodone-Acetaminophen] Other (See Comments)    Mood changes   Pioglitazone Swelling    On legs   Penicillins Diarrhea and Rash    Past Medical History:  Diagnosis Date   Diabetes mellitus without complication (HCC)    High cholesterol    Hypertension     Past Surgical History:  Procedure Laterality Date   DILATION AND CURETTAGE OF UTERUS     POLYPECTOMY      Social History:  reports that she has never smoked. She has never used smokeless tobacco. She reports that she does not drink alcohol and does not use drugs.  Family History:  Family History  Problem Relation Age of Onset   Breast cancer Maternal Aunt        2 aunts in their 104's   Breast cancer Maternal Aunt 70   Ovarian cancer Neg Hx    Colon cancer Neg Hx      Prior to Admission medications   Medication Sig Start Date End Date Taking? Authorizing Provider  clopidogrel (PLAVIX) 75 MG tablet Take 75 mg by mouth daily. 10/30/20  Yes [provider]  metFORMIN (GLUCOPHAGE) 500 MG tablet Take 1,000  mg by mouth 2 (two) times daily. 09/10/21  Yes [provider]  ondansetron (ZOFRAN-ODT) 4 MG disintegrating tablet Take 1 tablet (4 mg total) by mouth every 8 (eight) hours as needed for nausea or vomiting. 01/04/22  Yes Menshew, Dannielle Karvonen, PA-C  spironolactone (ALDACTONE) 25 MG tablet Take 25 mg by mouth daily. 02/18/16  Yes [provider]  insulin detemir (LEVEMIR) 100 UNIT/ML FlexPen Inject 10-20 Units into the skin daily.    [provider]    Physical Exam: Vitals:   01/05/22 1215 01/05/22 1216 01/05/22 1220  BP:   119/80  Pulse:   (!) 110  Resp:   19  Temp:   97.6 F (36.4 C)  TempSrc:   Oral  SpO2: 98%  97%  Weight:  63.9 kg   Height:  '5\' 2"'$  (1.575 m)    General: Not in acute distress HEENT:       Eyes: PERRL, EOMI, no scleral icterus.       ENT: No discharge from the ears and nose, no pharynx injection, no tonsillar enlargement.        Neck: No JVD, no bruit, no mass felt. Heme: No neck lymph node enlargement. Cardiac: S1/S2, RRR, No murmurs, No gallops or rubs. Respiratory: No rales, wheezing, rhonchi or rubs. GI: Soft, nondistended, nontender, no rebound pain, no organomegaly, BS present. GU: No hematuria Ext: No pitting leg edema bilaterally. 1+DP/PT pulse bilaterally. Musculoskeletal: No joint deformities, No joint redness or warmth, no limitation of ROM in spin. Skin: No rashes.  Neuro: Alert, oriented X3, cranial nerves II-XII grossly intact, moves all extremities normally.  Psych: Patient is not psychotic, no suicidal or hemocidal ideation.  Labs on Admission: I have personally reviewed following labs and imaging studies  CBC: Recent Labs  Lab 01/04/22 1303 01/05/22 1226  WBC 6.9 7.8  HGB 11.9* 10.7*  HCT 38.3 34.3*  MCV 90.3 89.3  PLT 454* 476*   Basic Metabolic Panel: Recent Labs  Lab 01/04/22 1318 01/05/22 1249  NA 137 137  K 4.8 4.4  CL 101 104  CO2 23 22  GLUCOSE 289* 232*  BUN 16 18  CREATININE 1.04* 1.32*   CALCIUM 9.3 8.8*   GFR: Estimated Creatinine Clearance: 32.3 mL/min (A) (by C-G formula based on SCr of 1.32 mg/dL (H)). Liver Function Tests: Recent Labs  Lab 01/04/22 1318 01/05/22 1249  AST 25 20  ALT 15 13  ALKPHOS 59 54  BILITOT 0.5 0.5  PROT 7.4 6.6  ALBUMIN 3.3* 3.0*   Recent Labs  Lab 01/04/22 1318  LIPASE 42   No results for input(s): AMMONIA in the last 168 hours. Coagulation Profile: Recent Labs  Lab 01/05/22 1512  INR 1.2   Cardiac Enzymes: No results for input(s): CKTOTAL, CKMB, CKMBINDEX, TROPONINI in the last 168 hours. BNP (last 3 results) No results for input(s): PROBNP in the last 8760 hours. HbA1C: No results for input(s): HGBA1C in the last 72 hours. CBG: No  results for input(s): GLUCAP in the last 168 hours. Lipid Profile: No results for input(s): CHOL, HDL, LDLCALC, TRIG, CHOLHDL, LDLDIRECT in the last 72 hours. Thyroid Function Tests: No results for input(s): TSH, T4TOTAL, FREET4, T3FREE, THYROIDAB in the last 72 hours. Anemia Panel: No results for input(s): VITAMINB12, FOLATE, FERRITIN, TIBC, IRON, RETICCTPCT in the last 72 hours. Urine analysis:    Component Value Date/Time   COLORURINE YELLOW (A) 01/04/2022 1303   APPEARANCEUR HAZY (A) 01/04/2022 1303   APPEARANCEUR Cloudy (A) 11/24/2021 1032   LABSPEC 1.025 01/04/2022 1303   PHURINE 5.0 01/04/2022 1303   GLUCOSEU >=500 (A) 01/04/2022 1303   HGBUR NEGATIVE 01/04/2022 1303   BILIRUBINUR NEGATIVE 01/04/2022 1303   BILIRUBINUR Negative 11/24/2021 1032   KETONESUR 5 (A) 01/04/2022 1303   PROTEINUR 30 (A) 01/04/2022 1303   NITRITE NEGATIVE 01/04/2022 1303   LEUKOCYTESUR SMALL (A) 01/04/2022 1303   Sepsis Labs: '@LABRCNTIP'$ (procalcitonin:4,lacticidven:4) )No results found for this or any previous visit (from the past 240 hour(s)).   Radiological Exams on Admission: CT HEAD WO CONTRAST (5MM)  Result Date: 01/05/2022 CLINICAL DATA:  A 76 year old female presents for evaluation of  head trauma. EXAM: CT HEAD WITHOUT CONTRAST TECHNIQUE: Contiguous axial images were obtained from the base of the skull through the vertex without intravenous contrast. RADIATION DOSE REDUCTION: This exam was performed according to the departmental dose-optimization program which includes automated exposure control, adjustment of the mA and/or kV according to patient size and/or use of iterative reconstruction technique. COMPARISON:  April 16, 2021. FINDINGS: Brain: No evidence of acute infarction, hemorrhage, hydrocephalus, extra-axial collection or mass lesion/mass effect. Signs of atrophy and of chronic microvascular ischemic change similar to prior imaging. Vascular: No hyperdense vessel or unexpected calcification. Skull: Normal. Negative for fracture or focal lesion. Sinuses/Orbits: Visualized paranasal sinuses and orbits are unremarkable. Other: None IMPRESSION: 1. No acute intracranial process. 2. Signs of atrophy and chronic microvascular ischemic change similar to prior imaging. Electronically Signed   By: Zetta Bills M.D.   On: 01/05/2022 13:05   CT Angio Chest PE W and/or Wo Contrast  Result Date: 01/05/2022 CLINICAL DATA:  Syncope EXAM: CT ANGIOGRAPHY CHEST WITH CONTRAST TECHNIQUE: Multidetector CT imaging of the chest was performed using the standard protocol during bolus administration of intravenous contrast. Multiplanar CT image reconstructions and MIPs were obtained to evaluate the vascular anatomy. RADIATION DOSE REDUCTION: This exam was performed according to the departmental dose-optimization program which includes automated exposure control, adjustment of the mA and/or kV according to patient size and/or use of iterative reconstruction technique. CONTRAST:  28m OMNIPAQUE IOHEXOL 350 MG/ML SOLN COMPARISON:  12/09/2020 FINDINGS: Cardiovascular: Contrast density in the thoracic aorta is less than optimal to evaluate the lumen. There are scattered calcifications and atherosclerotic plaques  in the thoracic aorta and its major branches. Fairly extensive coronary artery calcifications are seen. There is saddle embolus in the right and left main pulmonary arteries. There is large thrombus burden in both lungs, more so in both lower lobes. Right ventricular cavity is dilated in comparison to left ventricular cavity consistent with right ventricular strain. There is reflux of contrast into hepatic veins. Heart is enlarged in size. Mediastinum/Nodes: No significant lymphadenopathy seen. Lungs/Pleura: There are small linear patchy densities in the both lower lung fields, more so on the right side. Small bilateral pleural effusions are seen, more so on the right side. Upper Abdomen: There is moderate to large ascites in the upper abdomen. Small hiatal hernia is seen. Musculoskeletal: Unremarkable. Review of  the MIP images confirms the above findings. IMPRESSION: Acute pulmonary embolism with large thrombus burden. There is saddle embolus in the right and left main pulmonary arteries. Acute right ventricular strain. Small bilateral pleural effusions, more so on the right side. Linear patchy infiltrates are seen in the lower lung fields, more so on the right side suggesting subsegmental atelectasis/pneumonia. There is moderate to large ascites in the upper abdomen. Imaging findings were relayed to Dr. Kerman Passey  by telephone call. Electronically Signed   By: Elmer Picker M.D.   On: 01/05/2022 15:13      Assessment/Plan Principal Problem:   Acute saddle pulmonary embolism (HCC) Active Problems:   Essential hypertension   AKI (acute kidney injury) (HCC)   Syncope   Stroke (HCC)   Elevated troponin   Nausea & vomiting   HLD (hyperlipidemia)   Diabetes mellitus without complication ([place])   Acute saddle pulmonary embolism ([place]): CT angiogram showed large saddle PE, surprisingly patient does not have chest pain or shortness of breath.  Oxygen saturation 97% on room air.  Currently patient  has tachycardia with heart rate of 110s, but hemodynamically stable.  Dr. Delana Meyer of vascular surgeon was consulted by EDP.  -admit to stepdown as pt -heparin drip initiated -2D echocardiogram ordered -LE dopplers ordered to evaluate for DVT -prn albuterol nebs and mucinex  -check BNP -f/u Dr. Delana Meyer of [facility]VS recommendations  Essential hypertension -IV hydralazine as needed -Hold spironolactone due to AKI  AKI (acute kidney injury) ([place]): Likely due to dehydration -IV fluid: 1 L normal saline, followed by 75 cc/h -Hold spironolactone  Syncope: CT head negative.  Most likely due to large PE -Frequent neuro check -IV fluid as above  History of stroke (HCC) -Hold Plavix since patient may need procedure  Elevated troponin: Troponin 269, likely demand ischemia secondary to large PE -Trend troponin -Check A1c, FLP -Patient was given 1 dose of aspirin 324 mg  Nausea & vomiting: No diarrhea or abdominal pain.  May be related to large PE.  Lipase was normal yesterday -Supportive care -IV fluids -As needed Zofran  HLD (hyperlipidemia): Patient not taking medications currently -Follow-up FLP  Diabetes mellitus without complication [facility]): Patient states that she is taking metformin and Levemir 20 units daily at bedtime -Sliding scale insulin -Glargine insulin 10 units daily      DVT ppx: on IV Heparin       Code Status: Full code per pt and her daughter  Family Communication: Yes, patient's  daughter  at bed side.       Disposition Plan:  Anticipate discharge back to previous environment  Consults called: Dr. Delana Meyer of [facility]VS  Admission status and Level of care: Stepdown:   for obs    Severity of Illness:  The appropriate patient status for this patient is OBSERVATION. Observation status is judged to be reasonable and necessary in order to provide the required intensity of service to ensure the patient's safety. The patient's presenting symptoms, physical exam findings,  and initial radiographic and laboratory data in the context of their medical condition is felt to place them at decreased risk for further clinical deterioration. Furthermore, it is anticipated that the patient will be medically stable for discharge from the hospital within 2 midnights of admission.        Date of Service 01/05/2022    Ivor Costa [facility]   If 7PM-7AM, please contact night-coverage [email or web address] 01/05/2022, 4:24 PM

## 2022-01-05 NOTE — Progress Notes (Signed)
Sabrina [name] is a 76 year old woman that presents to [facility] for nausea, vomiting and decreased oral intake.  The patient had a syncopal event and passed out in her kitchen.  There were no CVA-like symptoms.  There was generalized weakness.  There were no episodes of chest pain or shortness of breath.  She has a past medical history significant for hypertension, diabetes, stroke, hyperlipidemia and bipolar disorder.  During work-up CT revealed a saddle embolus in the bilateral pulmonary arteries.  There is also acute right heart strain noted.  Also noted on CT scan for bilateral pleural effusions as well as ascites in the upper abdomen.  Based on evidence of right heart strain and syncopal events with pulmonary embolism we will keep the patient n.p.o. for possible pulmonary thrombectomy on 01/06/2022.  Full consult to follow. ?

## 2022-01-06 ENCOUNTER — Inpatient Hospital Stay
Admit: 2022-01-06 | Discharge: 2022-01-06 | Disposition: A | Discharge status: Home / Self Care | Payer: Medicare HMO | Attending: Internal Medicine | Admitting: Internal Medicine

## 2022-01-06 DIAGNOSIS — R778 Other specified abnormalities of plasma proteins: Secondary | ICD-10-CM | POA: Diagnosis not present

## 2022-01-06 DIAGNOSIS — N179 Acute kidney failure, unspecified: Secondary | ICD-10-CM | POA: Diagnosis not present

## 2022-01-06 DIAGNOSIS — I2602 Saddle embolus of pulmonary artery with acute cor pulmonale: Secondary | ICD-10-CM | POA: Diagnosis not present

## 2022-01-06 LAB — BASIC METABOLIC PANEL
Anion gap: 10 (ref 5–15)
BUN: 22 mg/dL (ref 8–23)
CO2: 21 mmol/L — ABNORMAL LOW (ref 22–32)
Calcium: 8.8 mg/dL — ABNORMAL LOW (ref 8.9–10.3)
Chloride: 107 mmol/L (ref 98–111)
Creatinine, Ser: 1.16 mg/dL — ABNORMAL HIGH (ref 0.44–1.00)
GFR, Estimated: 49 mL/min — ABNORMAL LOW (ref >60)
Glucose, Bld: 140 mg/dL — ABNORMAL HIGH (ref 70–99)
Potassium: 4.5 mmol/L (ref 3.5–5.1)
Sodium: 138 mmol/L (ref 135–145)

## 2022-01-06 LAB — LIPID PANEL
Cholesterol: 138 mg/dL (ref 0–200)
HDL: 34 mg/dL — ABNORMAL LOW (ref >40)
LDL Cholesterol: 75 mg/dL (ref 0–99)
Total CHOL/HDL Ratio: 4.1 RATIO
Triglycerides: 147 mg/dL (ref <150)
VLDL: 29 mg/dL (ref 0–40)

## 2022-01-06 LAB — ECHOCARDIOGRAM COMPLETE
AR max vel: 4.23 cm2
AV Area VTI: 4.08 cm2
AV Area mean vel: 3.58 cm2
AV Mean grad: 2 mmHg
AV Peak grad: 2.9 mmHg
Ao pk vel: 0.85 m/s
Area-P 1/2: 5.46 cm2
Height: 62 in
MV VTI: 3.87 cm2
S' Lateral: 1.8 cm
Weight: 2328.06 oz

## 2022-01-06 LAB — CBC
HCT: 32.8 % — ABNORMAL LOW (ref 36.0–46.0)
Hemoglobin: 10.4 g/dL — ABNORMAL LOW (ref 12.0–15.0)
MCH: 27.6 pg (ref 26.0–34.0)
MCHC: 31.7 g/dL (ref 30.0–36.0)
MCV: 87 fL (ref 80.0–100.0)
Platelets: 407 10*3/uL — ABNORMAL HIGH (ref 150–400)
RBC: 3.77 MIL/uL — ABNORMAL LOW (ref 3.87–5.11)
RDW: 12.8 % (ref 11.5–15.5)
WBC: 6.4 10*3/uL (ref 4.0–10.5)
nRBC: 0 % (ref 0.0–0.2)

## 2022-01-06 LAB — GLUCOSE, CAPILLARY
Glucose-Capillary: 203 mg/dL — ABNORMAL HIGH (ref 70–99)
Glucose-Capillary: 150 mg/dL — ABNORMAL HIGH (ref 70–99)
Glucose-Capillary: 104 mg/dL — ABNORMAL HIGH (ref 70–99)
Glucose-Capillary: 98 mg/dL (ref 70–99)
Glucose-Capillary: 79 mg/dL (ref 70–99)

## 2022-01-06 LAB — HEPARIN LEVEL (UNFRACTIONATED)
Heparin Unfractionated: 0.63 IU/mL (ref 0.30–0.70)
Heparin Unfractionated: 0.53 IU/mL (ref 0.30–0.70)

## 2022-01-06 LAB — HEMOGLOBIN A1C
Hgb A1c MFr Bld: 8.8 % — ABNORMAL HIGH (ref 4.8–5.6)
Mean Plasma Glucose: 206 mg/dL

## 2022-01-06 MED ORDER — HEPARIN (PORCINE) 25000 UT/250ML-% IV SOLN
950.0000 [IU]/h | INTRAVENOUS | Status: DC
  Administered 2022-01-06 – 2022-01-08 (×4): 950 [IU]/h via INTRAVENOUS
  Filled 2022-01-06 (×3): qty 250

## 2022-01-06 MED ORDER — LORAZEPAM 2 MG/ML IJ SOLN: 0.5000 mg | Freq: Once | INTRAMUSCULAR | Status: DC

## 2022-01-06 MED ORDER — FUROSEMIDE 10 MG/ML IJ SOLN
40.0000 mg | Freq: Once | INTRAMUSCULAR | Status: AC
  Administered 2022-01-06: 40 mg via INTRAVENOUS
  Filled 2022-01-06: qty 4

## 2022-01-06 MED ORDER — LORAZEPAM 2 MG/ML IJ SOLN
INTRAMUSCULAR | Status: AC
  Filled 2022-01-06: qty 1

## 2022-01-06 NOTE — Progress Notes (Signed)
?  Progress Note ? ? ?Patient: Sabrina [name]:[id number] DOB: [birth year] DOA: 01/05/2022     1 ?DOS: the patient was seen and examined on 01/06/2022 ?  ?Brief hospital course: ?Sabrina [name] is a 76 y.o. female with medical history significant of hypertension, hyperlipidemia, diabetes mellitus, stroke, bipolar disorder COVID-19 infection 12/08/2020, who presents with syncope.  Chest CT angiogram showed saddle PE, she is a placed on IV heparin drip. ? ?Assessment and Plan: ?* Acute saddle pulmonary embolism ([place]) ?Patient is on heparin drip. ?Patient is scheduled to have pulmonary thrombectomy today by Dr. Delana Meyer, but the patient wanted to a different doctor.  Discussed with Dr. Delana Meyer and Dr. Wandra Feinstein, Dr. Lucky Cowboy is not available for surgery today.  Then came back talking to patient and her daughter, patient is adamant that she will not have surgery by Dr. Delana Meyer, even if she has to die. ?As a result, we will attempt for surgery tomorrow. ?We will monitor patient closely in the stepdown unit. ?Patient has elevated BNP, probable diagnosis of acute cor pulmonale from PE.  Prior echocardiogram 2 years ago had a normal ejection fraction, repeat echocardiogram is pending today.  I will give her dose of IV Lasix.  Discontinue IV fluids. ? ? ?Diabetes mellitus without complication ([place]) ?Continue insulin glargine and sliding scale insulin. ? ?Elevated troponin ?Secondary to PE. ? ?Stroke [facility]) ?History of stroke, no recurrence ? ?Syncope ?Secondary to PE. ? ?AKI (acute kidney injury) ([place]) ?Renal function is better. ? ?Essential hypertension ?Blood pressure stable, patient is not on any blood pressure medicine at home. ? ? ? ? ?  ? ?Subjective:  ?Patient doing better today, denies any chest pain or shortness of breath.  No cough. ?No nausea vomiting or diarrhea. ? ?Physical Exam: ?Vitals:  ? 01/06/22 0600 01/06/22 0700 01/06/22 0800 01/06/22 0900  ?BP: 110/66 115/63 116/74 129/80  ?Pulse: (!) 111 (!) 110 (!) 111 (!) 113  ?Resp:  '16 17 19 16  '$ ?Temp:   97.9 ?F (36.6 ?C)   ?TempSrc:   Oral   ?SpO2: 91% 95% 93% 94%  ?Weight:      ?Height:      ? ?General exam: Appears calm and comfortable  ?Respiratory system: Clear to auscultation. Respiratory effort normal. ?Cardiovascular system: S1 & S2 heard, RRR. No JVD, murmurs, rubs, gallops or clicks. No pedal edema. ?Gastrointestinal system: Abdomen is nondistended, soft and nontender. No organomegaly or masses felt. Normal bowel sounds heard. ?Central nervous system: Alert and oriented x2. No focal neurological deficits. ?Extremities: Symmetric 5 x 5 power. ?Skin: No rashes, lesions or ulcers ?Psychiatry: Judgement and insight appear normal. Mood & affect appropriate.  ? ?Data Reviewed: ?Reviewed CT scan, all labs. ? ?Family Communication: Daughter updated at bedside. ? ?Disposition: ?Status is: Inpatient ?Remains inpatient appropriate because: Severity of disease, IV treatment, pending procedure. ? Planned Discharge Destination: Home ? ? ? ?Time spent: 45 minutes ? ?Author: ?Sharen Hones, MD ?01/06/2022 10:49 AM ? ?For on call review [email or web address].  ?

## 2022-01-06 NOTE — Progress Notes (Signed)
*  PRELIMINARY RESULTS* ?Echocardiogram ?2D Echocardiogram has been performed. ? ?Sabrina [name], Sonia Side ?01/06/2022, 8:51 AM ?

## 2022-01-06 NOTE — Assessment & Plan Note (Addendum)
Secondary to saddle pulmonary embolism. ?

## 2022-01-06 NOTE — Assessment & Plan Note (Addendum)
Troponins went up to 420.  Likely secondary to pulmonary embolism.  No complaints of chest pain or shortness of breath. ?

## 2022-01-06 NOTE — Assessment & Plan Note (Addendum)
Creatinine upon presentation 1.04.  Creatinine on 313 up at 1.32.  Yesterday's creatinine 1.04. Underlying chronic kidney disease stage IIIa ?

## 2022-01-06 NOTE — Progress Notes (Signed)
0830 Dr Roosevelt Locks spoke with patient regarding PE most likely related to Covid patient had in 11/2021 patient thinks covid is a conspiracy and refused to have Dr. Ronalee Belts work with her at all. Dr. Roosevelt Locks will see if other vascular Dr. Available to remove PE today. Patients daughter at bedside. ?0900 patient call light placed on request to see chaplain and states that the social worker wanted to do a private interview with her, patient questioned when this happen since no social worker has seen her since admission patient then asking about admission lady Mateo Flow? Patient then raised her voice asking for Chaplain visit. Chaplain paged. Patient daughter at bedside. ?

## 2022-01-06 NOTE — Assessment & Plan Note (Deleted)
Continue insulin glargine and sliding scale insulin. ?

## 2022-01-06 NOTE — Consult Note (Deleted)
ANTICOAGULATION CONSULT NOTE ? ?Pharmacy Consult for heparin ?Indication: Pulmonary Embolsim ? ?Allergies  ?Allergen Reactions  ? Glimepiride Diarrhea and Other (See Comments)  ? Glipizide Other (See Comments)  ? Lisinopril Other (See Comments)  ?  headache  ? Losartan Other (See Comments)  ?  Headache  ? Norco [Hydrocodone-Acetaminophen] Other (See Comments)  ?  Mood changes  ? Pioglitazone Swelling  ?  On legs  ? Penicillins Diarrhea and Rash  ? ? ?Patient Measurements: ?Height: '5\' 2"'$  (157.5 cm) ?Weight: 66 kg (145 lb 8.1 oz) ?IBW/kg (Calculated) : 50.1 ?Heparin Dosing Weight: 63 kg ? ?Vital Signs: ?Temp: 98.1 ?F (36.7 ?C) (03/14 1600) ?Temp Source: Oral (03/14 1600) ?BP: 113/78 (03/14 1800) ?Pulse Rate: 110 (03/14 1800) ? ?Labs: ?Recent Labs  ?  01/04/22 ?1303 01/04/22 ?1318 01/05/22 ?1226 01/05/22 ?1249 01/05/22 ?1512 01/05/22 ?1819 01/05/22 ?2248 01/06/22 ?0446 01/06/22 ?3762 01/06/22 ?1822  ?HGB 11.9*  --  10.7*  --   --   --   --  10.4*  --   --   ?HCT 38.3  --  34.3*  --   --   --   --  32.8*  --   --   ?PLT 454*  --  437*  --   --   --   --  407*  --   --   ?APTT  --   --   --   --  29  --   --   --   --   --   ?LABPROT  --   --   --   --  15.5*  --   --   --   --   --   ?INR  --   --   --   --  1.2  --   --   --   --   --   ?HEPARINUNFRC  --   --   --   --   --   --  1.07*  --  0.63 0.53  ?CREATININE  --  1.04*  --  1.32*  --   --   --  1.16*  --   --   ?TROPONINIHS  --   --  269*  --  332* 420*  --   --   --   --   ? ? ? ?Estimated Creatinine Clearance: 37.4 mL/min (A) (by C-G formula based on SCr of 1.16 mg/dL (H)). ? ? ?Medical History: ?Past Medical History:  ?Diagnosis Date  ? Diabetes mellitus without complication ([place])   ? High cholesterol   ? Hypertension   ? ? ?Medications:  ?PTA: Clopidogrel '75mg'$  PO QD ?Inpatient: Heparin infusion (3/13 >>> )  ?Allergies: No AC/APT related allergies ? ?Assessment: ?76yo female with past medical history of hyperlipidemia, history of CVA, and hypertension presents  to ED with abdominal pain weakness, and nausea. Cardiac high sensitivity tropinins elevated at 269 ng/L. Pharmacy consulted for management of heparin drip in the setting of pulmonary embolism. H&H, platelets stable. pulmonary thrombectomy  pending ? ?CTA Chest: Acute pulmonary embolism with large thrombus burden. There is saddle embolus in the right and left main pulmonary arteries. Acute right ?ventricular strain.  ? ?Goal of Therapy:  ?Heparin level 0.3-0.7 units/ml ?Monitor platelets by anticoagulation protocol: Yes ?  ?Plan:  ?3/14'@1822'$ : HL 0.53, therapeutic x 2 ?Continue heparin infusion at 950 units/hr ?Recheck heparin level with AM labs ?Daily CBC while on IV heparin ? ?Pearla Dubonnet, PharmD ?Clinical Pharmacist ?01/06/2022 ?  7:27 PM ? ? ? ?

## 2022-01-06 NOTE — Progress Notes (Signed)
1720 patient arrived from ER alert x4 able to communicate all needs when asking admission questions patient states " I refuse to discuss this at this time" patient then winked right eye several times when asked admission question "Do you feel safe at home?" Then patient informed that she didn't have to wink eye that she could be honest that no one was in the room bedside  staff daughter was in waiting area. Patient states " you all keep asking me these questions and I dont want to answer this." Patient informed of the importance of her safety and that if she didn't feel safe at home that we could help. Patient states " No one hits me ok, I only want the daughter that's here to be with me." Patient asked what her daughters name was patient states her name is "Sabrina [name] " and was agreeable to talk with her and share information with patient and set up a password. Patient states " No other family member can have information they are all just waiting for me to die."  ?

## 2022-01-06 NOTE — Progress Notes (Signed)
?  Chaplain On-Call responded to a call from Unit Secretary Chasity to visit the patient. ? ?Chaplain received medical and social update from patient's RN Herschel Senegal. ? ?The patient asked this Chaplain to sit quietly at the bedside and to pray silently with her. ?After several minutes, the patient asked Chaplain to recite Psalm 23 with her. ? ?Chaplain continued to provide spiritual and emotional support. ? ?The patient stated her gratitude for this visit. ?Chaplain assured her of availability of Chaplains upon her request. ? ?Chaplain Pollyann Samples ?M.Div., [facility] ? ? ?

## 2022-01-06 NOTE — Assessment & Plan Note (Addendum)
Patient is status post thrombolysis and mechanical thrombectomy for saddle pulmonary emboli on 01/08/2022.  Echocardiogram shows normal left ventricular ejection fraction but right ventricular systolic function is mildly reduced with severely elevated pulmonary arterial systolic pressures.  Ultrasound lower extremity negative.  Continue Eliquis. ? ?

## 2022-01-06 NOTE — Assessment & Plan Note (Deleted)
Blood pressure stable, patient is not on any blood pressure medicine at home. ?

## 2022-01-06 NOTE — Consult Note (Signed)
ANTICOAGULATION CONSULT NOTE ? ?Pharmacy Consult for heparin ?Indication: Pulmonary Embolsim ? ?Allergies  ?Allergen Reactions  ? Glimepiride Diarrhea and Other (See Comments)  ? Glipizide Other (See Comments)  ? Lisinopril Other (See Comments)  ?  headache  ? Losartan Other (See Comments)  ?  Headache  ? Norco [Hydrocodone-Acetaminophen] Other (See Comments)  ?  Mood changes  ? Pioglitazone Swelling  ?  On legs  ? Penicillins Diarrhea and Rash  ? ? ?Patient Measurements: ?Height: '5\' 2"'$  (157.5 cm) ?Weight: 66 kg (145 lb 8.1 oz) ?IBW/kg (Calculated) : 50.1 ?Heparin Dosing Weight: 63 kg ? ?Vital Signs: ?Temp: 98.1 ?F (36.7 ?C) (03/14 1600) ?Temp Source: Oral (03/14 1600) ?BP: 113/78 (03/14 1800) ?Pulse Rate: 110 (03/14 1800) ? ?Labs: ?Recent Labs  ?  01/04/22 ?1303 01/04/22 ?1318 01/05/22 ?1226 01/05/22 ?1249 01/05/22 ?1512 01/05/22 ?1819 01/05/22 ?2248 01/06/22 ?0446 01/06/22 ?8841 01/06/22 ?1822  ?HGB 11.9*  --  10.7*  --   --   --   --  10.4*  --   --   ?HCT 38.3  --  34.3*  --   --   --   --  32.8*  --   --   ?PLT 454*  --  437*  --   --   --   --  407*  --   --   ?APTT  --   --   --   --  29  --   --   --   --   --   ?LABPROT  --   --   --   --  15.5*  --   --   --   --   --   ?INR  --   --   --   --  1.2  --   --   --   --   --   ?HEPARINUNFRC  --   --   --   --   --   --  1.07*  --  0.63 0.53  ?CREATININE  --  1.04*  --  1.32*  --   --   --  1.16*  --   --   ?TROPONINIHS  --   --  269*  --  332* 420*  --   --   --   --   ? ? ? ?Estimated Creatinine Clearance: 37.4 mL/min (A) (by C-G formula based on SCr of 1.16 mg/dL (H)). ? ? ?Medical History: ?Past Medical History:  ?Diagnosis Date  ? Diabetes mellitus without complication ([place])   ? High cholesterol   ? Hypertension   ? ? ?Medications: Heparin Dosing Weight: 63 kg ?PTA: Clopidogrel '75mg'$  PO QD ?Inpatient: Heparin infusion (3/13 >>> )  ?Allergies: No AC/APT related allergies ? ?Assessment: ?76yo female with past medical history of hyperlipidemia, history of  CVA, and hypertension presents to ED with abdominal pain weakness, and nausea. Cardiac high sensitivity tropinins elevated at 269 ng/L. Pharmacy consulted for management of heparin drip in the setting of pulmonary embolism. H&H, platelets stable. pulmonary thrombectomy  pending ? ?CTA Chest: Acute pulmonary embolism with large thrombus burden. There is saddle embolus in the right and left main pulmonary arteries. Acute right ?ventricular strain.  ? ?Date Time aPTT/HL Rate/Comment ?3/13 2248  -- / 1.07 Supratherapeutic; 1150>950 un/hr ?3/13 0922  -- / 0.63 Thera x1; 950 un/hr ?3/13 1822  -- / 0.53 Thera x2; 950 un/hr ? ?Baseline Labs: ?aPTT - 29s ?INR - 1.2 ?Hgb - 11.9>10.7>10.4 ?Plts -  223>361>224  ? ?Goal of Therapy:  ?Heparin level 0.3-0.7 units/ml ?Monitor platelets by anticoagulation protocol: Yes ?  ?Plan:  ?Heparin level therapeutic x2 consecutively. ?Continue heparin infusion at 950 units/hr ?Recheck heparin level daily w/ AM labs ?Daily CBC while on IV heparin ? ?Lorna Dibble, PharmD, BCCP ?Clinical Pharmacist ?01/06/2022 7:31 PM ? ? ? ? ?

## 2022-01-06 NOTE — Hospital Course (Addendum)
Sabrina [name] is a 76 y.o. female with medical history significant of hypertension, hyperlipidemia, diabetes mellitus, stroke, bipolar disorder COVID-19 infection 12/08/2020, who presents with syncope.  Chest CT angiogram showed saddle PE, she is on IV heparin drip. ?

## 2022-01-06 NOTE — Consult Note (Signed)
ANTICOAGULATION CONSULT NOTE - Initial Consult ? ?Pharmacy Consult for heparin ?Indication: Pulmonary Embolsim ? ?Allergies  ?Allergen Reactions  ? Glimepiride Diarrhea and Other (See Comments)  ? Glipizide Other (See Comments)  ? Lisinopril Other (See Comments)  ?  headache  ? Losartan Other (See Comments)  ?  Headache  ? Norco [Hydrocodone-Acetaminophen] Other (See Comments)  ?  Mood changes  ? Pioglitazone Swelling  ?  On legs  ? Penicillins Diarrhea and Rash  ? ? ?Patient Measurements: ?Height: '5\' 2"'$  (157.5 cm) ?Weight: 66 kg (145 lb 8.1 oz) ?IBW/kg (Calculated) : 50.1 ?Heparin Dosing Weight: 63 kg ? ?Vital Signs: ?Temp: 98.6 ?F (37 ?C) (03/13 2000) ?Temp Source: Axillary (03/13 2000) ?BP: 96/66 (03/13 2200) ?Pulse Rate: 111 (03/13 2200) ? ?Labs: ?Recent Labs  ?  01/04/22 ?1303 01/04/22 ?1318 01/05/22 ?1226 01/05/22 ?1249 01/05/22 ?1512 01/05/22 ?1819 01/05/22 ?2248  ?HGB 11.9*  --  10.7*  --   --   --   --   ?HCT 38.3  --  34.3*  --   --   --   --   ?PLT 454*  --  437*  --   --   --   --   ?APTT  --   --   --   --  29  --   --   ?LABPROT  --   --   --   --  15.5*  --   --   ?INR  --   --   --   --  1.2  --   --   ?HEPARINUNFRC  --   --   --   --   --   --  1.07*  ?CREATININE  --  1.04*  --  1.32*  --   --   --   ?TROPONINIHS  --   --  269*  --  332* 420*  --   ? ? ? ?Estimated Creatinine Clearance: 32.8 mL/min (A) (by C-G formula based on SCr of 1.32 mg/dL (H)). ? ? ?Medical History: ?Past Medical History:  ?Diagnosis Date  ? Diabetes mellitus without complication ([place])   ? High cholesterol   ? Hypertension   ? ? ?Medications:  ?PTA: Clopidogrel '75mg'$  PO QD ?Inpatient: Heparin infusion (3/13 >>> )  ?Allergies: No AC/APT related allergies ? ?Assessment: ?76yo female with past medical history of hyperlipidemia, history of CVA, and hypertension presents to ED with abdominal pain weakness, and nausea. Cardiac high sensitivity tropinins elevated at 269 ng/L. Pharmacy consulted for management of heparin drip in the  setting of pulmonary embolism ? ?CTA Chest: Acute pulmonary embolism with large thrombus burden. There is saddle ?embolus in the right and left main pulmonary arteries. Acute right ?ventricular strain.  ? ?Date Time aPTT/HL Rate/Comment ?     ? ?Baseline Labs: ?aPTT - pending ?Hgb - 10.7 ?Plts - 437 ? ?Goal of Therapy:  ?Heparin level 0.3-0.7 units/ml ?Monitor platelets by anticoagulation protocol: Yes ?  ?Plan:  ?3/13:  HL @ 2248 = 1.07, elevated ?Will hold heparin drip for 1 hr and restart @ 950 units/hr. ?Will recheck HL 8 hrs after restart.  ? ?Ameka Krigbaum D, PharmD, ?Clinical Pharmacist ?01/06/2022 ?12:04 AM ? ? ? ?

## 2022-01-06 NOTE — Consult Note (Addendum)
?[facility] ?Vascular Consult Note ? ?MRN : [id number] ? ?Sabrina [name] is a 76 y.o. (11-07-1945) female who presents with chief complaint of  ?Chief Complaint  ?Patient presents with  ? Loss of Consciousness  ?. ? ?History of Present Illness: We are asked to consult by Dr. Roosevelt Locks for acute pulmonary embolism.  Sabrina [name] is a 76 year old woman that presents to [facility] for nausea, vomiting and decreased oral intake.  The patient had a syncopal event and passed out in her kitchen.  There were no CVA-like symptoms.  There was generalized weakness.  There were no episodes of chest pain or shortness of breath.  She has a past medical history significant for hypertension, diabetes, stroke, hyperlipidemia and bipolar disorder.  During work-up CT revealed a saddle embolus in the bilateral pulmonary arteries.  There is also acute right heart strain noted.  Also noted on CT scan for bilateral pleural effusions as well as ascites in the upper abdomen. ? ?Current Facility-Administered Medications  ?Medication Dose Route Frequency Provider Last Rate Last Admin  ? acetaminophen (TYLENOL) tablet 650 mg  650 mg Oral Q6H PRN Ivor Costa, MD   650 mg at 01/05/22 2117  ? albuterol (PROVENTIL) (2.5 MG/3ML) 0.083% nebulizer solution 3 mL  3 mL Inhalation Q4H PRN Ivor Costa, MD      ? atorvastatin (LIPITOR) tablet 40 mg  40 mg Oral QPM Ivor Costa, MD      ? Chlorhexidine Gluconate Cloth 2 % PADS 6 each  6 each Topical Q0600 Ivor Costa, MD   6 each at 01/05/22 1724  ? dextromethorphan-guaiFENesin (MUCINEX DM) 30-600 MG per 12 hr tablet 1 tablet  1 tablet Oral BID PRN Ivor Costa, MD      ? heparin ADULT infusion 100 units/mL (25000 units/241m)  950 Units/hr Intravenous Continuous NIvor Costa MD 9.5 mL/hr at 01/06/22 1112 950 Units/hr at 01/06/22 1112  ? hydrALAZINE (APRESOLINE) injection 5 mg  5 mg Intravenous Q2H PRN NIvor Costa MD      ? insulin aspart (novoLOG) injection 0-5 Units   0-5 Units Subcutaneous QHS NIvor Costa MD      ? insulin aspart (novoLOG) injection 0-9 Units  0-9 Units Subcutaneous TID WC NIvor Costa MD   1 Units at 01/06/22 05188 ? insulin glargine-yfgn (SEMGLEE) injection 10 Units  10 Units Subcutaneous Daily NIvor Costa MD   10 Units at 01/06/22 1113  ? ondansetron (ZOFRAN) injection 4 mg  4 mg Intravenous Q8H PRN NIvor Costa MD   4 mg at 01/05/22 2343  ? ? ?Past Medical History:  ?Diagnosis Date  ? Diabetes mellitus without complication ([place]   ? High cholesterol   ? Hypertension   ? ? ?Past Surgical History:  ?Procedure Laterality Date  ? DILATION AND CURETTAGE OF UTERUS    ? POLYPECTOMY    ? ? ?Social History ?Social History  ? ?Tobacco Use  ? Smoking status: Never  ? Smokeless tobacco: Never  ?Vaping Use  ? Vaping Use: Never used  ?Substance Use Topics  ? Alcohol use: No  ? Drug use: No  ? ? ?Family History ?Family History  ?Problem Relation Age of Onset  ? Breast cancer Maternal Aunt   ?     2 aunts in their 736's ? Breast cancer Maternal Aunt 760 ? Ovarian cancer Neg Hx   ? Colon cancer Neg Hx   ? ? ?Allergies  ?Allergen Reactions  ? Glimepiride Diarrhea and Other (  See Comments)  ? Glipizide Other (See Comments)  ? Lisinopril Other (See Comments)  ?  headache  ? Losartan Other (See Comments)  ?  Headache  ? Norco [Hydrocodone-Acetaminophen] Other (See Comments)  ?  Mood changes  ? Pioglitazone Swelling  ?  On legs  ? Penicillins Diarrhea and Rash  ? ? ? ?REVIEW OF SYSTEMS (Negative unless checked) ? ?Constitutional: '[]'$ Weight loss  '[]'$ Fever  '[]'$ Chills ?Cardiac: '[]'$ Chest pain   '[]'$ Chest pressure   '[]'$ Palpitations   '[]'$ Shortness of breath when laying flat   '[]'$ Shortness of breath at rest   '[x]'$ Shortness of breath with exertion. ?Vascular:  '[]'$ Pain in legs with walking   '[]'$ Pain in legs at rest   '[]'$ Pain in legs when laying flat   '[]'$ Claudication   '[]'$ Pain in feet when walking  '[]'$ Pain in feet at rest  '[]'$ Pain in feet when laying flat   '[]'$ History of DVT   '[]'$ Phlebitis   '[]'$ Swelling in legs    '[]'$ Varicose veins   '[]'$ Non-healing ulcers ?Pulmonary:   '[]'$ Uses home oxygen   '[]'$ Productive cough   '[]'$ Hemoptysis   '[]'$ Wheeze  '[]'$ COPD   '[]'$ Asthma ?Neurologic:  '[]'$ Dizziness  '[]'$ Blackouts   '[]'$ Seizures   '[]'$ History of stroke   '[]'$ History of TIA  '[]'$ Aphasia   '[]'$ Temporary blindness   '[]'$ Dysphagia   '[]'$ Weakness or numbness in arms   '[]'$ Weakness or numbness in legs ?Musculoskeletal:  '[]'$ Arthritis   '[]'$ Joint swelling   '[]'$ Joint pain   '[]'$ Low back pain ?Hematologic:  '[]'$ Easy bruising  '[]'$ Easy bleeding   '[]'$ Hypercoagulable state   '[]'$ Anemic  '[]'$ Hepatitis ?Gastrointestinal:  '[]'$ Blood in stool   '[]'$ Vomiting blood  '[]'$ Gastroesophageal reflux/heartburn   '[]'$ Difficulty swallowing. ?Genitourinary:  '[]'$ Chronic kidney disease   '[]'$ Difficult urination  '[]'$ Frequent urination  '[]'$ Burning with urination   '[]'$ Blood in urine ?Skin:  '[]'$ Rashes   '[]'$ Ulcers   '[]'$ Wounds ?Psychological:  '[]'$ History of anxiety   '[]'$  History of major depression. ? ?Physical Examination ? ?Vitals:  ? 01/06/22 1120 01/06/22 1200 01/06/22 1300 01/06/22 1400  ?BP:  109/67 126/78 137/81  ?Pulse:  (!) 107 (!) 120 (!) 123  ?Resp:  '14 15 14  '$ ?Temp: 97.8 ?F (36.6 ?C)     ?TempSrc: Oral     ?SpO2:  96% 97% 94%  ?Weight:      ?Height:      ? ?Body mass index is 26.61 kg/m?. ?Gen:  WD/WN, NAD ?Head: [place]/AT, No temporalis wasting. Prominent temp pulse not noted. ?Ear/Nose/Throat: Hearing grossly intact, nares w/o erythema or drainage, oropharynx w/o Erythema/Exudate ?Eyes: Sclera non-icteric, conjunctiva clear ?Neck: Trachea midline.  No JVD.  ?Pulmonary:  respirations not labored ?Cardiac: Tachycardic ?Vascular:  ?Vessel Right Left  ?PT Palpable Palpable  ?DP Palpable Palpable  ? ?Gastrointestinal: soft, non-tender/non-distended. No guarding/reflex.  ?Musculoskeletal: M/S 5/5 throughout.  Extremities without ischemic changes.  No deformity or atrophy. No edema. ?Neurologic: Sensation grossly intact in extremities.  Symmetrical.  Speech is fluent. Motor exam as listed above. ?Psychiatric: Judgment intact, Mood  & affect appropriate for pt's clinical situation. ?Dermatologic: No rashes or ulcers noted.  No cellulitis or open wounds. ?Lymph : No Cervical, Axillary, or Inguinal lymphadenopathy. ? ? ? ? ?CBC ?Lab Results  ?Component Value Date  ? WBC 6.4 01/06/2022  ? HGB 10.4 (L) 01/06/2022  ? HCT 32.8 (L) 01/06/2022  ? MCV 87.0 01/06/2022  ? PLT 407 (H) 01/06/2022  ? ? ?BMET ?   ?Component Value Date/Time  ? NA 138 01/06/2022 0446  ? K 4.5 01/06/2022 0446  ? CL 107 01/06/2022 0446  ?  CO2 21 (L) 01/06/2022 0446  ? GLUCOSE 140 (H) 01/06/2022 0446  ? BUN 22 01/06/2022 0446  ? CREATININE 1.16 (H) 01/06/2022 0446  ? CALCIUM 8.8 (L) 01/06/2022 0446  ? GFRNONAA 49 (L) 01/06/2022 0446  ? GFRAA >60 02/01/2020 0437  ? ?Estimated Creatinine Clearance: 37.4 mL/min (A) (by C-G formula based on SCr of 1.16 mg/dL (H)). ? ?COAG ?Lab Results  ?Component Value Date  ? INR 1.2 01/05/2022  ? INR 1.2 04/17/2021  ? INR 1.0 01/30/2020  ? ? ?Radiology ?CT HEAD WO CONTRAST (5MM) ? ?Result Date: 01/05/2022 ?CLINICAL DATA:  A 76 year old female presents for evaluation of head trauma. EXAM: CT HEAD WITHOUT CONTRAST TECHNIQUE: Contiguous axial images were obtained from the base of the skull through the vertex without intravenous contrast. RADIATION DOSE REDUCTION: This exam was performed according to the departmental dose-optimization program which includes automated exposure control, adjustment of the mA and/or kV according to patient size and/or use of iterative reconstruction technique. COMPARISON:  April 16, 2021. FINDINGS: Brain: No evidence of acute infarction, hemorrhage, hydrocephalus, extra-axial collection or mass lesion/mass effect. Signs of atrophy and of chronic microvascular ischemic change similar to prior imaging. Vascular: No hyperdense vessel or unexpected calcification. Skull: Normal. Negative for fracture or focal lesion. Sinuses/Orbits: Visualized paranasal sinuses and orbits are unremarkable. Other: None IMPRESSION: 1. No acute  intracranial process. 2. Signs of atrophy and chronic microvascular ischemic change similar to prior imaging. Electronically Signed   By: Zetta Bills M.D.   On: 01/05/2022 13:05  ? ?CT Angio Chest PE W a

## 2022-01-06 NOTE — Consult Note (Signed)
ANTICOAGULATION CONSULT NOTE ? ?Pharmacy Consult for heparin ?Indication: Pulmonary Embolsim ? ?Allergies  ?Allergen Reactions  ? Glimepiride Diarrhea and Other (See Comments)  ? Glipizide Other (See Comments)  ? Lisinopril Other (See Comments)  ?  headache  ? Losartan Other (See Comments)  ?  Headache  ? Norco [Hydrocodone-Acetaminophen] Other (See Comments)  ?  Mood changes  ? Pioglitazone Swelling  ?  On legs  ? Penicillins Diarrhea and Rash  ? ? ?Patient Measurements: ?Height: '5\' 2"'$  (157.5 cm) ?Weight: 66 kg (145 lb 8.1 oz) ?IBW/kg (Calculated) : 50.1 ?Heparin Dosing Weight: 63 kg ? ?Vital Signs: ?Temp: 97.6 ?F (36.4 ?C) (03/14 0400) ?Temp Source: Axillary (03/14 0400) ?BP: 110/66 (03/14 0600) ?Pulse Rate: 111 (03/14 0600) ? ?Labs: ?Recent Labs  ?  01/04/22 ?1303 01/04/22 ?1318 01/05/22 ?1226 01/05/22 ?1249 01/05/22 ?1512 01/05/22 ?1819 01/05/22 ?2248 01/06/22 ?0446  ?HGB 11.9*  --  10.7*  --   --   --   --  10.4*  ?HCT 38.3  --  34.3*  --   --   --   --  32.8*  ?PLT 454*  --  437*  --   --   --   --  407*  ?APTT  --   --   --   --  29  --   --   --   ?LABPROT  --   --   --   --  15.5*  --   --   --   ?INR  --   --   --   --  1.2  --   --   --   ?HEPARINUNFRC  --   --   --   --   --   --  1.07*  --   ?CREATININE  --  1.04*  --  1.32*  --   --   --  1.16*  ?TROPONINIHS  --   --  269*  --  332* 420*  --   --   ? ? ? ?Estimated Creatinine Clearance: 37.4 mL/min (A) (by C-G formula based on SCr of 1.16 mg/dL (H)). ? ? ?Medical History: ?Past Medical History:  ?Diagnosis Date  ? Diabetes mellitus without complication ([place])   ? High cholesterol   ? Hypertension   ? ? ?Medications:  ?PTA: Clopidogrel '75mg'$  PO QD ?Inpatient: Heparin infusion (3/13 >>> )  ?Allergies: No AC/APT related allergies ? ?Assessment: ?76yo female with past medical history of hyperlipidemia, history of CVA, and hypertension presents to ED with abdominal pain weakness, and nausea. Cardiac high sensitivity tropinins elevated at 269 ng/L. Pharmacy  consulted for management of heparin drip in the setting of pulmonary embolism. H&H, platelets stable. pulmonary thrombectomy  pending ? ?CTA Chest: Acute pulmonary embolism with large thrombus burden. There is saddle embolus in the right and left main pulmonary arteries. Acute right ?ventricular strain.  ? ?Goal of Therapy:  ?Heparin level 0.3-0.7 units/ml ?Monitor platelets by anticoagulation protocol: Yes ?  ?Plan:  ?Heparin level now therapeutic ?Continue heparin infusion at 950 units/hr ?Recheck heparin level in 8 hours to confirm ?Daily CBC while on IV heparin ? ?Dallie Piles, PharmD, BCPS ?Clinical Pharmacist ?01/06/2022 ?6:57 AM ? ? ? ?

## 2022-01-06 NOTE — Assessment & Plan Note (Addendum)
Prior history of stroke on aspirin and Plavix as outpatient.  Both these medications were discontinued since the patient be on Eliquis upon disposition. ?

## 2022-01-07 DIAGNOSIS — I2602 Saddle embolus of pulmonary artery with acute cor pulmonale: Secondary | ICD-10-CM | POA: Diagnosis not present

## 2022-01-07 DIAGNOSIS — N179 Acute kidney failure, unspecified: Secondary | ICD-10-CM | POA: Diagnosis not present

## 2022-01-07 DIAGNOSIS — R55 Syncope and collapse: Secondary | ICD-10-CM | POA: Diagnosis not present

## 2022-01-07 DIAGNOSIS — R9389 Abnormal findings on diagnostic imaging of other specified body structures: Secondary | ICD-10-CM | POA: Diagnosis not present

## 2022-01-07 LAB — BASIC METABOLIC PANEL
Anion gap: 11 (ref 5–15)
BUN: 21 mg/dL (ref 8–23)
CO2: 21 mmol/L — ABNORMAL LOW (ref 22–32)
Calcium: 8.7 mg/dL — ABNORMAL LOW (ref 8.9–10.3)
Chloride: 105 mmol/L (ref 98–111)
Creatinine, Ser: 1.23 mg/dL — ABNORMAL HIGH (ref 0.44–1.00)
GFR, Estimated: 46 mL/min — ABNORMAL LOW (ref >60)
Glucose, Bld: 109 mg/dL — ABNORMAL HIGH (ref 70–99)
Potassium: 3.9 mmol/L (ref 3.5–5.1)
Sodium: 137 mmol/L (ref 135–145)

## 2022-01-07 LAB — CBC WITH DIFFERENTIAL/PLATELET
Abs Immature Granulocytes: 0.02 10*3/uL (ref 0.00–0.07)
Basophils Absolute: 0 10*3/uL (ref 0.0–0.1)
Basophils Relative: 0 %
Eosinophils Absolute: 0.1 10*3/uL (ref 0.0–0.5)
Eosinophils Relative: 1 %
HCT: 34 % — ABNORMAL LOW (ref 36.0–46.0)
Hemoglobin: 10.5 g/dL — ABNORMAL LOW (ref 12.0–15.0)
Immature Granulocytes: 0 %
Lymphocytes Relative: 18 %
Lymphs Abs: 1.2 10*3/uL (ref 0.7–4.0)
MCH: 27.5 pg (ref 26.0–34.0)
MCHC: 30.9 g/dL (ref 30.0–36.0)
MCV: 89 fL (ref 80.0–100.0)
Monocytes Absolute: 0.6 10*3/uL (ref 0.1–1.0)
Monocytes Relative: 8 %
Neutro Abs: 4.8 10*3/uL (ref 1.7–7.7)
Neutrophils Relative %: 73 %
Platelets: 426 10*3/uL — ABNORMAL HIGH (ref 150–400)
RBC: 3.82 MIL/uL — ABNORMAL LOW (ref 3.87–5.11)
RDW: 12.9 % (ref 11.5–15.5)
WBC: 6.6 10*3/uL (ref 4.0–10.5)
nRBC: 0 % (ref 0.0–0.2)

## 2022-01-07 LAB — MAGNESIUM: Magnesium: 1.9 mg/dL (ref 1.7–2.4)

## 2022-01-07 LAB — GLUCOSE, CAPILLARY
Glucose-Capillary: 108 mg/dL — ABNORMAL HIGH (ref 70–99)
Glucose-Capillary: 149 mg/dL — ABNORMAL HIGH (ref 70–99)
Glucose-Capillary: 130 mg/dL — ABNORMAL HIGH (ref 70–99)
Glucose-Capillary: 110 mg/dL — ABNORMAL HIGH (ref 70–99)

## 2022-01-07 LAB — HEPARIN LEVEL (UNFRACTIONATED): Heparin Unfractionated: 0.55 IU/mL (ref 0.30–0.70)

## 2022-01-07 MED ORDER — SODIUM CHLORIDE 0.9 % IV SOLN: INTRAVENOUS | Status: DC

## 2022-01-07 NOTE — Assessment & Plan Note (Addendum)
Patient can go back on metformin as outpatient can discontinue insulin.  Follow-up with Dr. Doy Hutching. ?

## 2022-01-07 NOTE — Progress Notes (Signed)
?Progress Note ? ? ?Patient: Sabrina [name] QIW:[id number] DOB: [birth year] DOA: 01/05/2022     2 ?DOS: the patient was seen and examined on 01/07/2022 ?  ?Brief hospital course: ?Sabrina [name] is a 76 y.o. female with medical history significant of hypertension, hyperlipidemia, diabetes mellitus, stroke, bipolar disorder COVID-19 infection 12/08/2020, who presents with syncope.  Chest CT angiogram showed saddle PE, she is a placed on IV heparin drip. ? ?Assessment and Plan: ?* Acute saddle pulmonary embolism ([place]) ?Patient currently on heparin drip.  N.p.o. for vascular procedure today by Dr. Lucky Cowboy.  Will likely need Eliquis upon disposition.  Echocardiogram shows normal left ventricular ejection fraction but right ventricular systolic function is mildly reduced with severely elevated pulmonary arterial systolic pressures. ? ? ?Acute kidney injury superimposed on CKD ([place]) ?Creatinine upon presentation 1.04.  Creatinine on 313 up at 1.32.  Today's creatinine 1.23.  We will give gentle IV fluid hydration.  Underlying chronic kidney disease stage IIIa. ? ?Abnormal finding present on diagnostic imaging of uterus ?Reviewing pelvic ultrasound from 04/16/2021 and CT scan from 04/16/2021.  At that time the patient declined any gynecological work-up.  She has not followed up as outpatient.  With those imaging studies cannot rule out endometrial carcinoma. ? ?Syncope ?Secondary to saddle pulmonary embolism. ? ?Stroke [facility]) ?Prior history of stroke on aspirin and Plavix as outpatient. ? ?Elevated troponin ?Troponins went up to 420.  Likely secondary to pulmonary embolism.  No complaints of chest pain or shortness of breath currently ? ?Type 2 diabetes mellitus with hyperlipidemia ([place]) ?Continue Lipitor.  Patient on glargine insulin and sliding scale insulin.  Watch sugars closely with being n.p.o. ? ? ? ? ?  ? ?Subjective: Patient came in because she passed out.  No complaints of chest pain or shortness of breath.  Was diagnosed  with saddle pulmonary emboli ? ?Physical Exam: ?Vitals:  ? 01/07/22 0500 01/07/22 0600 01/07/22 0700 01/07/22 0800  ?BP: 108/68 101/68 101/70 115/74  ?Pulse: (!) 117 (!) 117 (!) 114 (!) 116  ?Resp: '16 17 18 17  '$ ?Temp:    98.8 ?F (37.1 ?C)  ?TempSrc:    Oral  ?SpO2: 90% 92% 91% 93%  ?Weight:      ?Height:      ? ?Physical Exam ?HENT:  ?   Head: Normocephalic.  ?   Mouth/Throat:  ?   Pharynx: No oropharyngeal exudate.  ?Eyes:  ?   General: Lids are normal.  ?   Conjunctiva/sclera: Conjunctivae normal.  ?   Pupils: Pupils are equal, round, and reactive to light.  ?Cardiovascular:  ?   Rate and Rhythm: Normal rate and regular rhythm.  ?   Heart sounds: Normal heart sounds, S1 normal and S2 normal.  ?Pulmonary:  ?   Breath sounds: No decreased breath sounds, wheezing, rhonchi or rales.  ?Abdominal:  ?   Palpations: Abdomen is soft.  ?   Tenderness: There is no abdominal tenderness.  ?Musculoskeletal:  ?   Right lower leg: No swelling.  ?   Left lower leg: No swelling.  ?Skin: ?   General: Skin is warm.  ?   Findings: No rash.  ?Neurological:  ?   Mental Status: She is alert and oriented to person, place, and time.  ?  ?Data Reviewed: ?CT scan and echocardiogram reviewed.  Today's creatinine is 1.23 with a GFR 46. ? ?Family Communication: Daughter at bedside ? ?Disposition: ?Status is: Inpatient ?Remains inpatient appropriate because: N.p.o. for vascular's procedure today for pulmonary  embolism ?Planned Discharge Destination: Home ? ? ?Author: ?Loletha Grayer, MD ?01/07/2022 10:50 AM ? ?For on call review [email or web address].  ?

## 2022-01-07 NOTE — Consult Note (Signed)
ANTICOAGULATION CONSULT NOTE ? ?Pharmacy Consult for heparin ?Indication: Pulmonary Embolsim ? ?Allergies  ?Allergen Reactions  ? Glimepiride Diarrhea and Other (See Comments)  ? Glipizide Other (See Comments)  ? Lisinopril Other (See Comments)  ?  headache  ? Losartan Other (See Comments)  ?  Headache  ? Norco [Hydrocodone-Acetaminophen] Other (See Comments)  ?  Mood changes  ? Pioglitazone Swelling  ?  On legs  ? Penicillins Diarrhea and Rash  ? ? ?Patient Measurements: ?Height: '5\' 2"'$  (157.5 cm) ?Weight: 66 kg (145 lb 8.1 oz) ?IBW/kg (Calculated) : 50.1 ?Heparin Dosing Weight: 63 kg ? ?Vital Signs: ?Temp: 98.8 ?F (37.1 ?C) (03/15 0000) ?Temp Source: Oral (03/14 2000) ?BP: 123/75 (03/15 0200) ?Pulse Rate: 120 (03/15 0200) ? ?Labs: ?Recent Labs  ?  01/05/22 ?1226 01/05/22 ?1249 01/05/22 ?1512 01/05/22 ?1819 01/05/22 ?2248 01/06/22 ?0446 01/06/22 ?4481 01/06/22 ?1822 01/07/22 ?0355  ?HGB 10.7*  --   --   --   --  10.4*  --   --  10.5*  ?HCT 34.3*  --   --   --   --  32.8*  --   --  34.0*  ?PLT 437*  --   --   --   --  407*  --   --  426*  ?APTT  --   --  29  --   --   --   --   --   --   ?LABPROT  --   --  15.5*  --   --   --   --   --   --   ?INR  --   --  1.2  --   --   --   --   --   --   ?HEPARINUNFRC  --   --   --   --    < >  --  0.63 0.53 0.55  ?CREATININE  --  1.32*  --   --   --  1.16*  --   --  1.23*  ?TROPONINIHS 269*  --  332* 420*  --   --   --   --   --   ? < > = values in this interval not displayed.  ? ? ? ?Estimated Creatinine Clearance: 35.2 mL/min (A) (by C-G formula based on SCr of 1.23 mg/dL (H)). ? ? ?Medical History: ?Past Medical History:  ?Diagnosis Date  ? Diabetes mellitus without complication ([place])   ? High cholesterol   ? Hypertension   ? ? ?Medications:  ?PTA: Clopidogrel '75mg'$  PO QD ?Inpatient: Heparin infusion (3/13 >>> )  ?Allergies: No AC/APT related allergies ? ?Assessment: ?76yo female with past medical history of hyperlipidemia, history of CVA, and hypertension presents to ED with  abdominal pain weakness, and nausea. Cardiac high sensitivity tropinins elevated at 269 ng/L. Pharmacy consulted for management of heparin drip in the setting of pulmonary embolism. H&H, platelets stable. pulmonary thrombectomy  pending ? ?CTA Chest: Acute pulmonary embolism with large thrombus burden. There is saddle embolus in the right and left main pulmonary arteries. Acute right ?ventricular strain.  ? ?Goal of Therapy:  ?Heparin level 0.3-0.7 units/ml ?Monitor platelets by anticoagulation protocol: Yes ?  ?Plan:  ?3/15:  HL @ 0355 = 55, therapeutic X 3 ?Will continue pt on current rate and recheck HL on 3/16 with AM labs.  ? ?Lacoya Wilbanks D, PharmD ?Clinical Pharmacist ?01/07/2022 ?5:06 AM ? ? ? ?

## 2022-01-07 NOTE — Assessment & Plan Note (Addendum)
Reviewing pelvic ultrasound from 04/16/2021 and CT scan from 04/16/2021.  At that time the patient declined any gynecological work-up.  She has not followed up as outpatient.  With those imaging studies cannot rule out endometrial carcinoma.  Patient has had some intermittent vaginal bleeding.  Hemoglobin 9.5 upon discharge.  I gave IV iron on 01/10/2022.  Refer to gynecology as outpatient. ?

## 2022-01-07 NOTE — Progress Notes (Signed)
[facility] ? ?Daily Progress Note ? ? ?Subjective  -  ? ?Remains tachycardic, mildly hypoxic. No major events overnight ? ?Objective ?Vitals:  ? 01/07/22 0900 01/07/22 1000 01/07/22 1100 01/07/22 1200  ?BP: 112/70  112/69 113/76  ?Pulse: (!) 114 (!) 116 (!) 115 (!) 111  ?Resp: '16 17 18 17  '$ ?Temp:    98.7 ?F (37.1 ?C)  ?TempSrc:    Oral  ?SpO2: 92% 92% 92% 92%  ?Weight:      ?Height:      ? ? ?Intake/Output Summary (Last 24 hours) at 01/07/2022 1447 ?Last data filed at 01/07/2022 1230 ?Gross per 24 hour  ?Intake 357.41 ml  ?Output 776 ml  ?Net -418.59 ml  ? ? ?PULM  CTAB ?CV  tachycardic ? ?Laboratory ?CBC ?   ?Component Value Date/Time  ? WBC 6.6 01/07/2022 0355  ? HGB 10.5 (L) 01/07/2022 0355  ? HCT 34.0 (L) 01/07/2022 0355  ? PLT 426 (H) 01/07/2022 0355  ? ? ?BMET ?   ?Component Value Date/Time  ? NA 137 01/07/2022 0355  ? K 3.9 01/07/2022 0355  ? CL 105 01/07/2022 0355  ? CO2 21 (L) 01/07/2022 0355  ? GLUCOSE 109 (H) 01/07/2022 0355  ? BUN 21 01/07/2022 0355  ? CREATININE 1.23 (H) 01/07/2022 0355  ? CALCIUM 8.7 (L) 01/07/2022 0355  ? GFRNONAA 46 (L) 01/07/2022 0355  ? GFRAA >60 02/01/2020 0437  ? ? ?Assessment/Planning: ? ? ?Saddle pulmonary embolus ?Stable but tachycardic and mildly hypoxic ?Some SOB at rest ?Plan PE thrombectomy tomorrow in specials as no available time today ? ? ?Leotis Pain ? ?01/07/2022, 2:47 PM ? ? ? ?  ?

## 2022-01-07 NOTE — Assessment & Plan Note (Deleted)
Patient on Lipitor ?
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
## 2022-01-07 NOTE — H&P (View-Only) (Signed)
[facility] ? ?Daily Progress Note ? ? ?Subjective  -  ? ?Remains tachycardic, mildly hypoxic. No major events overnight ? ?Objective ?Vitals:  ? 01/07/22 0900 01/07/22 1000 01/07/22 1100 01/07/22 1200  ?BP: 112/70  112/69 113/76  ?Pulse: (!) 114 (!) 116 (!) 115 (!) 111  ?Resp: '16 17 18 17  '$ ?Temp:    98.7 ?F (37.1 ?C)  ?TempSrc:    Oral  ?SpO2: 92% 92% 92% 92%  ?Weight:      ?Height:      ? ? ?Intake/Output Summary (Last 24 hours) at 01/07/2022 1447 ?Last data filed at 01/07/2022 1230 ?Gross per 24 hour  ?Intake 357.41 ml  ?Output 776 ml  ?Net -418.59 ml  ? ? ?PULM  CTAB ?CV  tachycardic ? ?Laboratory ?CBC ?   ?Component Value Date/Time  ? WBC 6.6 01/07/2022 0355  ? HGB 10.5 (L) 01/07/2022 0355  ? HCT 34.0 (L) 01/07/2022 0355  ? PLT 426 (H) 01/07/2022 0355  ? ? ?BMET ?   ?Component Value Date/Time  ? NA 137 01/07/2022 0355  ? K 3.9 01/07/2022 0355  ? CL 105 01/07/2022 0355  ? CO2 21 (L) 01/07/2022 0355  ? GLUCOSE 109 (H) 01/07/2022 0355  ? BUN 21 01/07/2022 0355  ? CREATININE 1.23 (H) 01/07/2022 0355  ? CALCIUM 8.7 (L) 01/07/2022 0355  ? GFRNONAA 46 (L) 01/07/2022 0355  ? GFRAA >60 02/01/2020 0437  ? ? ?Assessment/Planning: ? ? ?Saddle pulmonary embolus ?Stable but tachycardic and mildly hypoxic ?Some SOB at rest ?Plan PE thrombectomy tomorrow in specials as no available time today ? ? ?Sabrina [name] ? ?01/07/2022, 2:47 PM ? ? ? ?  ?

## 2022-01-08 ENCOUNTER — Encounter
Admission: EM | Disposition: A | Discharge status: Home Health | Payer: Self-pay | Source: Home / Self Care | Attending: Internal Medicine

## 2022-01-08 DIAGNOSIS — N179 Acute kidney failure, unspecified: Secondary | ICD-10-CM | POA: Diagnosis not present

## 2022-01-08 DIAGNOSIS — I2602 Saddle embolus of pulmonary artery with acute cor pulmonale: Secondary | ICD-10-CM | POA: Diagnosis not present

## 2022-01-08 DIAGNOSIS — D75839 Thrombocytosis, unspecified: Secondary | ICD-10-CM

## 2022-01-08 DIAGNOSIS — R9389 Abnormal findings on diagnostic imaging of other specified body structures: Secondary | ICD-10-CM | POA: Diagnosis not present

## 2022-01-08 DIAGNOSIS — R55 Syncope and collapse: Secondary | ICD-10-CM | POA: Diagnosis not present

## 2022-01-08 DIAGNOSIS — I2609 Other pulmonary embolism with acute cor pulmonale: Secondary | ICD-10-CM

## 2022-01-08 DIAGNOSIS — K219 Gastro-esophageal reflux disease without esophagitis: Secondary | ICD-10-CM

## 2022-01-08 HISTORY — PX: PULMONARY THROMBECTOMY: CATH118295

## 2022-01-08 LAB — CBC
HCT: 33.7 % — ABNORMAL LOW (ref 36.0–46.0)
Hemoglobin: 10.7 g/dL — ABNORMAL LOW (ref 12.0–15.0)
MCH: 27.5 pg (ref 26.0–34.0)
MCHC: 31.8 g/dL (ref 30.0–36.0)
MCV: 86.6 fL (ref 80.0–100.0)
Platelets: 451 10*3/uL — ABNORMAL HIGH (ref 150–400)
RBC: 3.89 MIL/uL (ref 3.87–5.11)
RDW: 12.9 % (ref 11.5–15.5)
WBC: 5.8 10*3/uL (ref 4.0–10.5)
nRBC: 0 % (ref 0.0–0.2)

## 2022-01-08 LAB — BASIC METABOLIC PANEL
Anion gap: 10 (ref 5–15)
BUN: 19 mg/dL (ref 8–23)
CO2: 20 mmol/L — ABNORMAL LOW (ref 22–32)
Calcium: 8.5 mg/dL — ABNORMAL LOW (ref 8.9–10.3)
Chloride: 106 mmol/L (ref 98–111)
Creatinine, Ser: 1.06 mg/dL — ABNORMAL HIGH (ref 0.44–1.00)
GFR, Estimated: 55 mL/min — ABNORMAL LOW (ref >60)
Glucose, Bld: 90 mg/dL (ref 70–99)
Potassium: 4 mmol/L (ref 3.5–5.1)
Sodium: 136 mmol/L (ref 135–145)

## 2022-01-08 LAB — GLUCOSE, CAPILLARY
Glucose-Capillary: 80 mg/dL (ref 70–99)
Glucose-Capillary: 73 mg/dL (ref 70–99)
Glucose-Capillary: 84 mg/dL (ref 70–99)
Glucose-Capillary: 90 mg/dL (ref 70–99)
Glucose-Capillary: 126 mg/dL — ABNORMAL HIGH (ref 70–99)

## 2022-01-08 LAB — HEPARIN LEVEL (UNFRACTIONATED): Heparin Unfractionated: 0.41 IU/mL (ref 0.30–0.70)

## 2022-01-08 SURGERY — PULMONARY THROMBECTOMY
Anesthesia: Moderate Sedation | Laterality: Bilateral

## 2022-01-08 MED ORDER — FENTANYL CITRATE (PF) 100 MCG/2ML IJ SOLN
INTRAMUSCULAR | Status: AC
  Filled 2022-01-08: qty 2

## 2022-01-08 MED ORDER — FENTANYL CITRATE (PF) 100 MCG/2ML IJ SOLN
INTRAMUSCULAR | Status: DC | PRN
  Administered 2022-01-08: 50 ug via INTRAVENOUS
  Administered 2022-01-08: 25 ug via INTRAVENOUS

## 2022-01-08 MED ORDER — POLYETHYLENE GLYCOL 3350 17 G PO PACK
17.0000 g | PACK | Freq: Every day | ORAL | Status: DC
  Administered 2022-01-08 – 2022-01-10 (×3): 17 g via ORAL
  Filled 2022-01-08 (×3): qty 1

## 2022-01-08 MED ORDER — HYDROMORPHONE HCL 1 MG/ML IJ SOLN: 1.0000 mg | Freq: Once | INTRAMUSCULAR | Status: DC | PRN

## 2022-01-08 MED ORDER — DIPHENHYDRAMINE HCL 50 MG/ML IJ SOLN: 50.0000 mg | Freq: Once | INTRAMUSCULAR | Status: DC | PRN

## 2022-01-08 MED ORDER — MIDAZOLAM HCL 2 MG/ML PO SYRP: 8.0000 mg | ORAL_SOLUTION | Freq: Once | ORAL | Status: DC | PRN

## 2022-01-08 MED ORDER — ONDANSETRON HCL 4 MG/2ML IJ SOLN
4.0000 mg | Freq: Four times a day (QID) | INTRAMUSCULAR | Status: DC | PRN
  Administered 2022-01-11: 4 mg via INTRAVENOUS
  Filled 2022-01-08: qty 2

## 2022-01-08 MED ORDER — CLINDAMYCIN PHOSPHATE 300 MG/50ML IV SOLN: 300.0000 mg | Freq: Once | INTRAVENOUS | Status: AC

## 2022-01-08 MED ORDER — SODIUM CHLORIDE 0.9 % IV SOLN: INTRAVENOUS | Status: DC

## 2022-01-08 MED ORDER — MIDAZOLAM HCL 2 MG/2ML IJ SOLN
INTRAMUSCULAR | Status: AC
  Filled 2022-01-08: qty 2

## 2022-01-08 MED ORDER — MIDAZOLAM HCL 2 MG/2ML IJ SOLN
INTRAMUSCULAR | Status: DC | PRN
  Administered 2022-01-08 (×2): 1 mg via INTRAVENOUS

## 2022-01-08 MED ORDER — PANTOPRAZOLE SODIUM 40 MG IV SOLR
40.0000 mg | INTRAVENOUS | Status: DC
  Administered 2022-01-08 – 2022-01-09 (×2): 40 mg via INTRAVENOUS
  Filled 2022-01-08 (×2): qty 10

## 2022-01-08 MED ORDER — FAMOTIDINE 20 MG PO TABS: 40.0000 mg | ORAL_TABLET | Freq: Once | ORAL | Status: DC | PRN

## 2022-01-08 MED ORDER — METHYLPREDNISOLONE SODIUM SUCC 125 MG IJ SOLR: 125.0000 mg | Freq: Once | INTRAMUSCULAR | Status: DC | PRN

## 2022-01-08 MED ORDER — HEPARIN SODIUM (PORCINE) 1000 UNIT/ML IJ SOLN
INTRAMUSCULAR | Status: AC
  Filled 2022-01-08: qty 10

## 2022-01-08 MED ORDER — ALTEPLASE 1 MG/ML SYRINGE FOR VASCULAR PROCEDURE
INTRAMUSCULAR | Status: DC | PRN
  Administered 2022-01-08 (×2): 4 mg via INTRA_ARTERIAL

## 2022-01-08 MED ORDER — CLINDAMYCIN PHOSPHATE 300 MG/50ML IV SOLN
INTRAVENOUS | Status: AC
  Administered 2022-01-08: 300 mg via INTRAVENOUS
  Filled 2022-01-08: qty 50

## 2022-01-08 SURGICAL SUPPLY — 14 items
CANISTER PENUMBRA ENGINE (MISCELLANEOUS) ×1 IMPLANT
CATH ANGIO 5F PIGTAIL 100CM (CATHETERS) ×1 IMPLANT
CATH INDIGO 12XTORQ 100 (CATHETERS) ×1 IMPLANT
CATH INDIGO SEP 12 (CATHETERS) ×1 IMPLANT
CATH INFINITI JR4 5F (CATHETERS) ×1 IMPLANT
CATH SELECT BERN TIP 5F 130 (CATHETERS) ×1 IMPLANT
COVER PROBE U/S 5X48 (MISCELLANEOUS) ×1 IMPLANT
DEVICE SAFEGUARD 24CM (GAUZE/BANDAGES/DRESSINGS) ×1 IMPLANT
GLIDEWIRE ADV .035X260CM (WIRE) ×1 IMPLANT
PACK ANGIOGRAPHY (CUSTOM PROCEDURE TRAY) ×2 IMPLANT
SHEATH PINNACLE 11FRX10 (SHEATH) ×1 IMPLANT
SUT PROLENE 0 CT 1 30 (SUTURE) ×1 IMPLANT
TUBING CONTRAST HIGH PRESS 72 (TUBING) ×1 IMPLANT
WIRE GUIDERIGHT .035X150 (WIRE) ×1 IMPLANT

## 2022-01-08 NOTE — Progress Notes (Signed)
?Progress Note ? ? ?Patient: Sabrina [name] VZD:[id number] DOB: 1946/03/27 DOA: 01/05/2022     3 ?DOS: the patient was seen and examined on 01/08/2022 ?  ?Brief hospital course: ?ALEA RYER is a 76 y.o. female with medical history significant of hypertension, hyperlipidemia, diabetes mellitus, stroke, bipolar disorder COVID-19 infection 12/08/2020, who presents with syncope.  Chest CT angiogram showed saddle PE, she is on IV heparin drip. ? ?Assessment and Plan: ?* Acute saddle pulmonary embolism ([place]) ?Patient currently on heparin drip.  N.p.o. for vascular procedure today by Dr. Lucky Cowboy.  (Procedure Bump from yesterday) will likely need Eliquis upon disposition.  Echocardiogram shows normal left ventricular ejection fraction but right ventricular systolic function is mildly reduced with severely elevated pulmonary arterial systolic pressures. ? ? ?Acute kidney injury superimposed on CKD ([place]) ?Creatinine upon presentation 1.04.  Creatinine on 313 up at 1.32.  Today's creatinine 1.06.  We will give gentle IV fluid hydration.  Underlying chronic kidney disease stage IIIa. ? ?Abnormal finding present on diagnostic imaging of uterus ?Reviewing pelvic ultrasound from 04/16/2021 and CT scan from 04/16/2021.  At that time the patient declined any gynecological work-up.  She has not followed up as outpatient.  With those imaging studies cannot rule out endometrial carcinoma.  Monitor closely for vaginal bleeding. ? ?Syncope ?Secondary to saddle pulmonary embolism. ? ?Stroke [facility]) ?Prior history of stroke on aspirin and Plavix as outpatient.  Likely will be on Eliquis on disposition. ? ?Elevated troponin ?Troponins went up to 420.  Likely secondary to pulmonary embolism.  No complaints of chest pain or shortness of breath currently ? ?Thrombocytosis ?Likely acute phase reactant secondary to saddle pulmonary embolism. ? ?Type 2 diabetes mellitus with hyperlipidemia ([place]) ?Continue Lipitor.  Patient on glargine insulin and sliding  scale insulin.  Watch sugars closely with being n.p.o. ? ?GERD (gastroesophageal reflux disease) ?Started on IV Protonix ? ? ? ? ?  ? ?Subjective: No complaints of chest pain or shortness of breath.  Does have some burning sensation going up into her chest from her abdomen.  Complains of acid reflux.  Admitted with syncope and found to have saddle pulmonary embolism. ? ?Physical Exam: ?Vitals:  ? 01/08/22 0900 01/08/22 1000 01/08/22 1100 01/08/22 1136  ?BP: 128/69 111/71 125/70 126/73  ?Pulse: (!) 108 (!) 101 (!) 102 (!) 103  ?Resp: '16 16 14 18  '$ ?Temp:    98.4 ?F (36.9 ?C)  ?TempSrc:    Oral  ?SpO2: 95% 96% 96% 99%  ?Weight:      ?Height:      ? ?Physical Exam ?HENT:  ?   Head: Normocephalic.  ?   Mouth/Throat:  ?   Pharynx: No oropharyngeal exudate.  ?Eyes:  ?   General: Lids are normal.  ?   Conjunctiva/sclera: Conjunctivae normal.  ?Cardiovascular:  ?   Rate and Rhythm: Normal rate and regular rhythm.  ?   Heart sounds: Normal heart sounds, S1 normal and S2 normal.  ?Pulmonary:  ?   Breath sounds: Normal breath sounds. No decreased breath sounds, wheezing, rhonchi or rales.  ?Abdominal:  ?   Palpations: Abdomen is soft.  ?   Tenderness: There is no abdominal tenderness.  ?Musculoskeletal:  ?   Right lower leg: No swelling.  ?   Left lower leg: No swelling.  ?Skin: ?   General: Skin is warm.  ?   Findings: No rash.  ?Neurological:  ?   Mental Status: She is alert.  ?  ?Data Reviewed: ?Hemoglobin stable  at 10.7.  Platelet count elevated 451 ? ? ?Family Communication: Family at bedside ? ?Disposition: ?Status is: Inpatient ?Remains inpatient appropriate because: Patient is going for thrombectomy and thrombolysis today by Dr. Lucky Cowboy ? ?Planned Discharge Destination: Home ? ?Author: ?Loletha Grayer, MD ?01/08/2022 12:10 PM ? ?For on call review [email or web address].  ?

## 2022-01-08 NOTE — Interval H&P Note (Signed)
History and Physical Interval Note: ? ?01/08/2022 ?11:56 AM ? ?Sabrina [name]  has presented today for surgery, with the diagnosis of pulmonary embolus.  The various methods of treatment have been discussed with the patient and family. After consideration of risks, benefits and other options for treatment, the patient has consented to  Procedure(s): ?PULMONARY THROMBECTOMY (Bilateral) as a surgical intervention.  The patient's history has been reviewed, patient examined, no change in status, stable for surgery.  I have reviewed the patient's chart and labs.  Questions were answered to the patient's satisfaction.   ? ? ?Leotis Pain ? ? ?

## 2022-01-08 NOTE — Op Note (Signed)
Sabrina [name] ? Percutaneous Study/Intervention Procedural Note ? ? ?Date of Surgery: 01/08/2022,1:01 PM ? ?Surgeon: Leotis Pain ? ?Pre-operative Diagnosis: Symptomatic bilateral pulmonary emboli ? ?Post-operative diagnosis:  Same ? ?Procedure(s) Performed: ? 1.  Contrast injection right heart ? 2.  Thrombolysis with a total of 8 mg of tPA, 4 mg in both main pulmonary arteries ? 3.  Mechanical thrombectomy using the penumbra CAT 12 catheter to the left lower lobe pulmonary artery, and the right lower lobe, middle lobe, and upper lobe pulmonary arteries ? 4.  Selective catheter placement right lower lobe, middle lobe, and upper lobe pulmonary arteries ? 5.  Selective catheter placement left lower lobe and upper lobe pulmonary artery ?  ? ?Anesthesia: Conscious sedation was administered under my direct supervision by the interventional radiology RN. IV Versed plus fentanyl were utilized. Continuous ECG, pulse oximetry and blood pressure was monitored throughout the entire procedure.  Versed and fentanyl were administered intravenously.  Conscious sedation was administered for a total of 30 minutes using 2 of Versed and 75 mcg of Fentanyl. ? ?EBL: 250 cc ? ?Sheath: 80 French right femoral vein ? ?Contrast: 50 cc  ? ?Fluoroscopy Time: 9.5 minutes ? ?Indications:  Patient presents with pulmonary emboli. The patient is symptomatic with hypoxemia and dyspnea on exertion.  There is evidence of right heart strain on the CT angiogram. The patient is otherwise a good candidate for intervention and even the long-term benefits pulmonary angiography with thrombolysis is offered. The risks and benefits are reviewed long-term benefits are discussed. All questions are answered patient agrees to proceed. ? ?Procedure:  Sabrina [name] a 76 y.o. female who was identified and appropriate procedural time out was performed.  The patient was then placed supine on the table and prepped and draped in the usual sterile  fashion.  Ultrasound was used to evaluate the right common femoral vein.  It was patent, as it was echolucent and compressible.  A digital ultrasound image was acquired for the permanent record.  A Seldinger needle was used to access the right common femoral vein under direct ultrasound guidance.  A 0.035 J wire was advanced without resistance and a 5Fr sheath was placed and then upsized to an 11 [place] sheath.   ? ?The wire and pigtail catheter were then negotiated into the right atrium and bolus injection of contrast was utilized to demonstrate the right ventricle and the pulmonary artery outflow. The advantage wire and JR4 catheter were then negotiated into the main pulmonary artery where hand injection of contrast was utilized to demonstrate the pulmonary arteries and confirm the locations of the pulmonary emboli.  Initially, advanced into the left lower lobe pulmonary artery where selective imaging showed extensive thrombosis in the left lower lobe.  The left upper lobe was then cannulated and selective imaging showed a moderate amount of thrombus in the left upper lobe pulmonary artery.  I then advanced to the right side where the right lower lobe was initially cannulated showing moderate amount of thrombus in the right lower lobe.  The right middle lobe was cannulated and selective imaging showed a moderate amount of thrombus.  The right upper lobe had an extensive amount of thrombus on selective imaging after cannulation. ? ?3000 units of heparin was then given and allowed to circulate. ? ?TPA was reconstituted and delivered onto the table. A total of 8 milligrams of TPA was utilized.  4 mg was administered on the left side and 4 mg was administered on  the right side. This was then allowed to dwell. ? ?The Penumbra Cat 12 catheter was then advanced up into the pulmonary vasculature. The left lung was addressed first. Catheter was negotiated into the left lower lobe and mechanical thrombectomy was performed  with the penumbra CAT 12 catheter and the separator. Follow-up imaging demonstrated a good result with only a small amount of residual thrombus.  The left upper lobe had a very difficult angle and getting the CAT 12 catheter up to the air was not going to be feasible and we had much more thrombus to take care of on the right side. ? ?The Penumbra Cat 12 catheter was then negotiated to the opposite side. The right lung was then addressed. Catheter was negotiated into the right lower lobe pulmonary artery and mechanical thrombectomy was performed with the use of a separator. Follow-up imaging demonstrated a good result and therefore the catheter was renegotiated into the right middle lobe pulmonary artery and again mechanical thrombectomy was performed. Passes were made with both the Penumbra catheter itself as well as introducing the separator. Follow-up imaging was then performed.  Significant improvement was seen.  I then was able to navigate into the right upper lobe pulmonary artery where mechanical thrombectomy was performed with the penumbra CAT 12 catheter and the separator with completion imaging showing marked improvement in the right upper lobe pulmonary artery as well. ? ?After review these images wires were reintroduced and the catheters removed. Then, the sheath is then pulled and pressures held. A safeguard is placed. ? ? ? ?Findings:  ? Right heart imaging:  Right atrium and right ventricle and the pulmonary outflow tract appears at least moderately dilated ? Right lung: Moderate to severe thrombus in the right lower lobe, middle lobe, and upper lobe pulmonary arteries as well as the right main pulmonary ? Left lung: Extensive thrombus in the left lower lobe pulmonary artery with a moderate amount of thrombus in the left upper lobe pulmonary ? ? ? ?Disposition: Patient was taken to the recovery room in stable condition having tolerated the procedure well. ? ?Leotis Pain ?01/08/2022,1:01 PM  ?

## 2022-01-08 NOTE — Assessment & Plan Note (Addendum)
Likely acute phase reactant secondary to saddle pulmonary embolism.  Platelet count now in the normal range.  Last platelet count 400,000. ?

## 2022-01-08 NOTE — Progress Notes (Signed)
Performed the intermittent and 50cc was collected. She urinated around the foley.  I bladder scanned patient after again it was 600. her belly is firm, she hasn't had a bowel movement since Saturday. BNP was elevated at 682, no history of chf or liver cirrhosis. Ct showed right plueral effusion but no ascitis  She no longer has the urge to void and doesn't have any lower abdominal pain. Notified Randol Kern NP of he current condition, no new orders at this time. Will continue to monitor and assess. ? ?

## 2022-01-08 NOTE — Plan of Care (Signed)
?  Problem: Elimination: ?Goal: Will not experience complications related to bowel motility ?Outcome: Not Progressing ?Goal: Will not experience complications related to urinary retention ?Outcome: Not Progressing ?  ?

## 2022-01-08 NOTE — Progress Notes (Addendum)
Cross Cover ?Patient unable to void. Bladder scan reveals 613 ml.  In and out cath ordered ? ?Nurse reports only 50 ml + urine around the catheter obtained with in and out cath procedure, and rescan of bladder still reading above 600. Advised bladder scan ultrasound likely reading uterine fluid present, consistent with history.  ?Patient did verbalize relief of lower abd discomfort post in and out cath procedure. ? ? ?

## 2022-01-08 NOTE — Assessment & Plan Note (Addendum)
Started on Protonix ?

## 2022-01-08 NOTE — Consult Note (Signed)
ANTICOAGULATION CONSULT NOTE ? ?Pharmacy Consult for IV Heparin ?Indication: Pulmonary embolism ? ?Patient Measurements: ?Height: '5\' 2"'$  (157.5 cm) ?Weight: 66 kg (145 lb 8.1 oz) ?IBW/kg (Calculated) : 50.1 ?Heparin Dosing Weight: 63 kg ? ?Labs: ?Recent Labs  ?  01/05/22 ?1226 01/05/22 ?1249 01/05/22 ?1512 01/05/22 ?1819 01/05/22 ?2248 01/06/22 ?0446 01/06/22 ?0922 01/06/22 ?1822 01/07/22 ?0355 01/08/22 ?0630  ?HGB 10.7*  --   --   --   --  10.4*  --   --  10.5* 10.7*  ?HCT 34.3*  --   --   --   --  32.8*  --   --  34.0* 33.7*  ?PLT 437*  --   --   --   --  407*  --   --  426* 451*  ?APTT  --   --  29  --   --   --   --   --   --   --   ?LABPROT  --   --  15.5*  --   --   --   --   --   --   --   ?INR  --   --  1.2  --   --   --   --   --   --   --   ?HEPARINUNFRC  --   --   --   --    < >  --    < > 0.53 0.55 0.41  ?CREATININE  --  1.32*  --   --   --  1.16*  --   --  1.23*  --   ?TROPONINIHS 269*  --  332* 420*  --   --   --   --   --   --   ? < > = values in this interval not displayed.  ? ? ? ?Estimated Creatinine Clearance: 35.2 mL/min (A) (by C-G formula based on SCr of 1.23 mg/dL (H)). ? ? ?Medical History: ?Past Medical History:  ?Diagnosis Date  ? Diabetes mellitus without complication ([place])   ? High cholesterol   ? Hypertension   ? ? ?Medications:  ?PTA: Clopidogrel '75mg'$  PO QD ?Inpatient: Heparin infusion (3/13 >>> )  ?Allergies: No AC/APT related allergies ? ?Assessment: ?76yo female with past medical history of hyperlipidemia, history of CVA, and hypertension presents to ED with abdominal pain weakness, and nausea. Cardiac high sensitivity tropinins elevated at 269 ng/L. Pharmacy consulted for management of heparin drip in the setting of pulmonary embolism. H&H, platelets stable. pulmonary thrombectomy  pending ? ?CTA Chest: Acute pulmonary embolism with large thrombus burden. There is saddle embolus in the right and left main pulmonary arteries. Acute right ?ventricular strain.  ? ?Goal of Therapy:   ?Heparin level 0.3-0.7 units/ml ?Monitor platelets by anticoagulation protocol: Yes ?  ?Plan:  ?--Heparin level is therapeutic ?--Re-check HL tomorrow AM ?--Daily CBC per protocol ?--Pending pulmonary thrombectomy with vascular today ?--Follow-up transition to oral anticoagulation as appropriate post-operatively ? ?Benita Gutter ?01/08/2022 ?7:19 AM ? ? ? ?

## 2022-01-08 NOTE — Plan of Care (Signed)

## 2022-01-08 NOTE — Progress Notes (Signed)
Upon putting pt on bedpan, Vaginal blood was on chuck, MD made aware, will continue to monitor. ?

## 2022-01-09 ENCOUNTER — Other Ambulatory Visit (HOSPITAL_COMMUNITY): Payer: Self-pay

## 2022-01-09 ENCOUNTER — Encounter: Payer: Self-pay | Admitting: Vascular Surgery

## 2022-01-09 DIAGNOSIS — N179 Acute kidney failure, unspecified: Secondary | ICD-10-CM | POA: Diagnosis not present

## 2022-01-09 DIAGNOSIS — R9389 Abnormal findings on diagnostic imaging of other specified body structures: Secondary | ICD-10-CM | POA: Diagnosis not present

## 2022-01-09 DIAGNOSIS — R55 Syncope and collapse: Secondary | ICD-10-CM | POA: Diagnosis not present

## 2022-01-09 DIAGNOSIS — I2602 Saddle embolus of pulmonary artery with acute cor pulmonale: Secondary | ICD-10-CM | POA: Diagnosis not present

## 2022-01-09 DIAGNOSIS — K219 Gastro-esophageal reflux disease without esophagitis: Secondary | ICD-10-CM

## 2022-01-09 LAB — BASIC METABOLIC PANEL
Anion gap: 10 (ref 5–15)
BUN: 20 mg/dL (ref 8–23)
CO2: 22 mmol/L (ref 22–32)
Calcium: 8.6 mg/dL — ABNORMAL LOW (ref 8.9–10.3)
Chloride: 104 mmol/L (ref 98–111)
Creatinine, Ser: 1.03 mg/dL — ABNORMAL HIGH (ref 0.44–1.00)
GFR, Estimated: 57 mL/min — ABNORMAL LOW (ref >60)
Glucose, Bld: 96 mg/dL (ref 70–99)
Potassium: 4.2 mmol/L (ref 3.5–5.1)
Sodium: 136 mmol/L (ref 135–145)

## 2022-01-09 LAB — CBC
HCT: 29.7 % — ABNORMAL LOW (ref 36.0–46.0)
Hemoglobin: 9.2 g/dL — ABNORMAL LOW (ref 12.0–15.0)
MCH: 27.3 pg (ref 26.0–34.0)
MCHC: 31 g/dL (ref 30.0–36.0)
MCV: 88.1 fL (ref 80.0–100.0)
Platelets: 375 10*3/uL (ref 150–400)
RBC: 3.37 MIL/uL — ABNORMAL LOW (ref 3.87–5.11)
RDW: 13.1 % (ref 11.5–15.5)
WBC: 6.4 10*3/uL (ref 4.0–10.5)
nRBC: 0 % (ref 0.0–0.2)

## 2022-01-09 LAB — GLUCOSE, CAPILLARY
Glucose-Capillary: 105 mg/dL — ABNORMAL HIGH (ref 70–99)
Glucose-Capillary: 69 mg/dL — ABNORMAL LOW (ref 70–99)
Glucose-Capillary: 106 mg/dL — ABNORMAL HIGH (ref 70–99)
Glucose-Capillary: 109 mg/dL — ABNORMAL HIGH (ref 70–99)
Glucose-Capillary: 126 mg/dL — ABNORMAL HIGH (ref 70–99)

## 2022-01-09 LAB — HEPARIN LEVEL (UNFRACTIONATED): Heparin Unfractionated: 0.45 IU/mL (ref 0.30–0.70)

## 2022-01-09 MED ORDER — IODIXANOL 320 MG/ML IV SOLN
INTRAVENOUS | Status: DC | PRN
  Administered 2022-01-08: 50 mL via INTRAVENOUS

## 2022-01-09 MED ORDER — APIXABAN 5 MG PO TABS
10.0000 mg | ORAL_TABLET | Freq: Two times a day (BID) | ORAL | Status: DC
  Administered 2022-01-09 – 2022-01-11 (×5): 10 mg via ORAL
  Filled 2022-01-09 (×5): qty 2

## 2022-01-09 MED ORDER — APIXABAN 5 MG PO TABS
5.0000 mg | ORAL_TABLET | Freq: Two times a day (BID) | ORAL | Status: DC
Start: 2022-01-16 — End: 2022-01-11

## 2022-01-09 MED ORDER — INSULIN GLARGINE-YFGN 100 UNIT/ML ~~LOC~~ SOLN
5.0000 [IU] | Freq: Every day | SUBCUTANEOUS | Status: DC
  Administered 2022-01-10 – 2022-01-11 (×2): 5 [IU] via SUBCUTANEOUS
  Filled 2022-01-09 (×2): qty 0.05

## 2022-01-09 MED ORDER — PANTOPRAZOLE SODIUM 40 MG PO TBEC
40.0000 mg | DELAYED_RELEASE_TABLET | Freq: Every day | ORAL | Status: DC
  Administered 2022-01-10 – 2022-01-11 (×2): 40 mg via ORAL
  Filled 2022-01-09 (×2): qty 1

## 2022-01-09 NOTE — Consult Note (Addendum)
ANTICOAGULATION CONSULT NOTE ? ?Pharmacy Consult for IV Heparin >> apixaban ?Indication: Pulmonary embolism ? ?Patient Measurements: ?Height: '5\' 2"'$  (157.5 cm) ?Weight: 66 kg (145 lb 8.1 oz) ?IBW/kg (Calculated) : 50.1 ? ?Labs: ?Recent Labs  ?  01/07/22 ?7209 01/08/22 ?0630 01/09/22 ?0323  ?HGB 10.5* 10.7* 9.2*  ?HCT 34.0* 33.7* 29.7*  ?PLT 426* 451* 375  ?HEPARINUNFRC 0.55 0.41 0.45  ?CREATININE 1.23* 1.06* 1.03*  ? ? ? ?Estimated Creatinine Clearance: 42.1 mL/min (A) (by C-G formula based on SCr of 1.03 mg/dL (H)). ? ? ?Medical History: ?Past Medical History:  ?Diagnosis Date  ? Diabetes mellitus without complication ([place])   ? High cholesterol   ? Hypertension   ? ? ?Medications:  ?PTA: Clopidogrel '75mg'$  PO QD ?Inpatient: Heparin gtt 3/13 >> 3/17  ?    Apixaban 3/17 >>         ?Allergies: No AC/APT related allergies ? ?Assessment: ?76yo female with past medical history of hyperlipidemia, history of CVA, and hypertension presents to ED with abdominal pain weakness, and nausea. Cardiac high sensitivity tropinins elevated at 269 ng/L. Pharmacy consulted for transition from heparin drip to apixaban for acute PE treatment and given completion of procedures.  ? ?CTA Chest: Acute pulmonary embolism with large thrombus burden. There is saddle embolus in the right and left main pulmonary arteries. Acute right ?ventricular strain.  ? ?Pulmonary thrombectomy 3/16 ? ?Goal of Therapy:  ?Heparin level 0.3-0.7 units/ml ?Monitor platelets by anticoagulation protocol: Yes ?  ?Plan:  ?--Stop IV heparin at 0959 ?--At 1000, start apixaban 10 mg BID x 7 days followed by apixaban 5 mg BID for remaining duration of treatment ?--CBC at least every 3 days per protocol ? ?Benita Gutter ?01/09/2022 ?7:53 AM ? ? ? ?

## 2022-01-09 NOTE — Progress Notes (Signed)
?Progress Note ? ? ?Patient: Sabrina [name]:[id number] DOB: 1946-08-23 DOA: 01/05/2022     4 ?DOS: the patient was seen and examined on 01/09/2022 ?  ? ? ?Assessment and Plan: ?* Acute saddle pulmonary embolism ([place]) ?Patient is status post thrombolysis and mechanical thrombectomy for saddle pulmonary emboli.  Patient was converted from heparin drip over to Eliquis today.  Echocardiogram shows normal left ventricular ejection fraction but right ventricular systolic function is mildly reduced with severely elevated pulmonary arterial systolic pressures.  Ultrasound lower extremity negative. ? ? ?Acute kidney injury superimposed on CKD ([place]) ?Creatinine upon presentation 1.04.  Creatinine on 313 up at 1.32.  Today's creatinine 1.03. Underlying chronic kidney disease stage IIIa ? ?Abnormal finding present on diagnostic imaging of uterus ?Reviewing pelvic ultrasound from 04/16/2021 and CT scan from 04/16/2021.  At that time the patient declined any gynecological work-up.  She has not followed up as outpatient.  With those imaging studies cannot rule out endometrial carcinoma.  Patient did have some vaginal bleeding yesterday.  Hemoglobin dipped down to 9.2 today we will check hemoglobin again tomorrow.  Will need gynecology referral as outpatient but will need Eliquis at this point ? ?Syncope ?Secondary to saddle pulmonary embolism. ? ?Stroke [facility]) ?Prior history of stroke on aspirin and Plavix as outpatient.  Will be on Eliquis upon disposition. ? ?Elevated troponin ?Troponins went up to 420.  Likely secondary to pulmonary embolism.  No complaints of chest pain or shortness of breath. ? ?Thrombocytosis ?Likely acute phase reactant secondary to saddle pulmonary embolism.  Platelet count now in the normal range ? ?Type 2 diabetes mellitus with hyperlipidemia ([place]) ?Continue Lipitor.  Patient on glargine insulin and will reduce the dose to 5 units daily. ? ?GERD (gastroesophageal reflux disease) ?Started on IV  Protonix ? ? ? ? ?  ? ?Subjective: Patient feels well offers no complaints.  Seen this morning.  Converting over from heparin drip over to Eliquis.  Admitted initially for saddle pulmonary embolism and syncope. ? ?Physical Exam: ?Vitals:  ? 01/09/22 0500 01/09/22 0600 01/09/22 0750 01/09/22 1055  ?BP: 104/60 (!) 105/56 121/60 126/67  ?Pulse: 89 87 93 97  ?Resp: '18 18 13 16  '$ ?Temp:   97.8 ?F (36.6 ?C) 98.3 ?F (36.8 ?C)  ?TempSrc:   Oral   ?SpO2: 96% 95% 95% 96%  ?Weight:      ?Height:      ? ?Physical Exam ?HENT:  ?   Head: Normocephalic.  ?   Mouth/Throat:  ?   Pharynx: No oropharyngeal exudate.  ?Eyes:  ?   General: Lids are normal.  ?   Conjunctiva/sclera: Conjunctivae normal.  ?Cardiovascular:  ?   Rate and Rhythm: Normal rate and regular rhythm.  ?   Heart sounds: Normal heart sounds, S1 normal and S2 normal.  ?Pulmonary:  ?   Breath sounds: Normal breath sounds. No decreased breath sounds, wheezing, rhonchi or rales.  ?Abdominal:  ?   Palpations: Abdomen is soft.  ?   Tenderness: There is no abdominal tenderness.  ?Musculoskeletal:  ?   Right lower leg: No swelling.  ?   Left lower leg: No swelling.  ?Skin: ?   General: Skin is warm.  ?   Findings: No rash.  ?Neurological:  ?   Mental Status: She is alert.  ?  ?Data Reviewed: ?Laboratory data reviewed from today. ? ?Family Communication: Left message for patient's daughter ? ?Disposition: ?Status is: Inpatient ?Remains inpatient appropriate because: With vaginal bleeding must watch closely  while on Eliquis.  We will check hemoglobin tomorrow. ? ?Planned Discharge Destination: Home and Home with Home Health ? ?Author: ?Loletha Grayer, MD ?01/09/2022 3:43 PM ? ?For on call review [email or web address].  ?

## 2022-01-09 NOTE — TOC Benefit Eligibility Note (Signed)
Patient Advocate Encounter ?  ?Insurance verification completed.   ?  ?The patient is currently admitted and upon discharge could be taking ELIQUIS. ?  ?The current 30 day co-pay is, $10.35.  ? ?The patient is insured through [employer]. ? ? ?  ? ?

## 2022-01-09 NOTE — Consult Note (Signed)
ANTICOAGULATION CONSULT NOTE ? ?Pharmacy Consult for IV Heparin ?Indication: Pulmonary embolism ? ?Patient Measurements: ?Height: '5\' 2"'$  (157.5 cm) ?Weight: 66 kg (145 lb 8.1 oz) ?IBW/kg (Calculated) : 50.1 ?Heparin Dosing Weight: 63 kg ? ?Labs: ?Recent Labs  ?  01/07/22 ?0355 01/08/22 ?0630 01/09/22 ?0323  ?HGB 10.5* 10.7* 9.2*  ?HCT 34.0* 33.7* 29.7*  ?PLT 426* 451* 375  ?HEPARINUNFRC 0.55 0.41 0.45  ?CREATININE 1.23* 1.06* 1.03*  ? ? ? ?Estimated Creatinine Clearance: 42.1 mL/min (A) (by C-G formula based on SCr of 1.03 mg/dL (H)). ? ? ?Medical History: ?Past Medical History:  ?Diagnosis Date  ? Diabetes mellitus without complication ([place])   ? High cholesterol   ? Hypertension   ? ? ?Medications:  ?PTA: Clopidogrel '75mg'$  PO QD ?Inpatient: Heparin infusion (3/13 >>> )  ?Allergies: No AC/APT related allergies ? ?Assessment: ?76yo female with past medical history of hyperlipidemia, history of CVA, and hypertension presents to ED with abdominal pain weakness, and nausea. Cardiac high sensitivity tropinins elevated at 269 ng/L. Pharmacy consulted for management of heparin drip in the setting of pulmonary embolism. H&H, platelets stable. pulmonary thrombectomy  pending ? ?CTA Chest: Acute pulmonary embolism with large thrombus burden. There is saddle embolus in the right and left main pulmonary arteries. Acute right ?ventricular strain.  ? ?Goal of Therapy:  ?Heparin level 0.3-0.7 units/ml ?Monitor platelets by anticoagulation protocol: Yes ?  ?Plan:  ?3/17:  HL @ 0323 = 0.45, therapeutic X 5 ?Will continue pt on current rate and recheck HL on 3/18 with AM labs.  ? ?Finleigh Cheong D ?01/09/2022 ?4:28 AM ? ? ? ?

## 2022-01-09 NOTE — TOC Progression Note (Signed)
Transition of Care (TOC) - Progression Note  ? ? ?Patient Details  ?Name: SHATERA RENNERT ?MRN: [id number] ?Date of Birth: Apr 23, 1946 ? ?Transition of Care (TOC) CM/SW Contact  ?Conception Oms, RN ?Phone Number: ?01/09/2022, 3:47 PM ? ?Clinical Narrative:   Met with the patient and her daughter in the room, I explained she was recommended to go to Home with [facility] by PT, She is set u0p with [facility] from the ER, Merleen Nicely at [facility] accepted, the patient tells me that she will need a 3 in 1, I sent the request to [facility] with [employer] they will deliver to the room ? ? ? ?Expected Discharge Plan: [place] ?Barriers to Discharge: Continued Medical Work up ? ?Expected Discharge Plan and Services ?Expected Discharge Plan: [place] ?  ?Discharge Planning Services: CM Consult ?Post Acute Care Choice: Home Health ?Living arrangements for the past 2 months: [place] ?                ?DME Arranged: 3-N-1 ?DME Agency: [facility], [place] ?Date DME Agency Contacted: 01/09/22 ?Time DME Agency Contacted: [phone] ?Representative spoke with at DME Agency: Andee Poles ?HH Arranged: PT, OT ?[place] Agency: [facility] ?Date HH Agency Contacted: 01/09/22 ?Time [place]: [phone] ?Representative spoke with at [place]: Merleen Nicely ? ? ?Social Determinants of Health (SDOH) Interventions ?  ? ?Readmission Risk Interventions ?No flowsheet data found. ? ?

## 2022-01-09 NOTE — Progress Notes (Signed)
Patient is not able to walk the distance required to go the bathroom, A 3 in 1 Will eleviate this problem.  ?

## 2022-01-09 NOTE — Progress Notes (Signed)
Inpatient Diabetes Program Recommendations ? ?AACE/ADA: New Consensus Statement on Inpatient Glycemic Control (2015) ? ?Target Ranges:  Prepandial:   less than 140 mg/dL ?     Peak postprandial:   less than 180 mg/dL (1-2 hours) ?     Critically ill patients:  140 - 180 mg/dL  ? ?Lab Results  ?Component Value Date  ? GLUCAP 105 (H) 01/09/2022  ? HGBA1C 8.8 (H) 01/05/2022  ? ? ?Review of Glycemic Control ? Latest Reference Range & Units 01/08/22 07:30 01/08/22 11:41 01/08/22 13:13 01/08/22 16:13 01/08/22 21:05 01/09/22 08:00 01/09/22 08:28  ?Glucose-Capillary 70 - 99 mg/dL 80 73 84 90 126 (H) 69 (L) 105 (H)  ? ?Diabetes history: DM 2 ?Outpatient Diabetes medications:  ?Metformin 1000 mg bid ?Levemir 10-20 units daily ?Current orders for Inpatient glycemic control:  ?Novolog sensitive tid with meals and HS ?Semglee 10 units daily ? ?Inpatient Diabetes Program Recommendations:   ? ?Consider reducing Semglee to 5 units daily.  ? ?Thanks,  ?Adah Perl, RN, BC-ADM ?Inpatient Diabetes Coordinator ?Pager [phone]  (8a-5p) ? ? ?

## 2022-01-09 NOTE — TOC Initial Note (Incomplete)
Transition of Care (TOC) - Initial/Assessment Note  ? ? ?Patient Details  ?Name: Sabrina [name] ?MRN: [id number] ?Date of Birth: Mar 04, 1946 ? ?Transition of Care (TOC) CM/SW Contact:    ?Shelbie Hutching, RN ?Phone Number: ?01/09/2022, 8:51 AM ? ?Clinical Narrative:                 ?Patient admitted to the hospital with pulmonary embolus, went for mechanical thrombectomy yesterday. ? ? ?Expected Discharge Plan: [place] ?Barriers to Discharge: Continued Medical Work up ? ? ?Patient Goals and CMS Choice ?Patient states their goals for this hospitalization and ongoing recovery are:: patient would not state goals ?CMS Medicare.gov Compare Post Acute Care list provided to:: Patient Represenative (must comment) ?Choice offered to / list presented to : Adult Children ? ?Expected Discharge Plan and Services ?Expected Discharge Plan: [place] ?  ?Discharge Planning Services: CM Consult ?Post Acute Care Choice: Home Health ?Living arrangements for the past 2 months: [place] ?                ?DME Arranged: N/A ?DME Agency: NA ?  ?  ?  ?HH Arranged: RN, PT, OT ?[place] Agency: [facility] ?Date HH Agency Contacted: 01/09/22 ?Time [place]: [phone] ?Representative spoke with at [place]: Merleen Nicely ? ?Prior Living Arrangements/Services ?Living arrangements for the past 2 months: [place] ?Lives with:: Adult Children ?Patient language and need for interpreter reviewed:: Yes ?Do you feel safe going back to the place where you live?: Yes      ?Need for Family Participation in Patient Care: Yes (Comment) ?Care giver support system in place?: Yes (comment) (daughter) ?Current home services: DME ?Criminal Activity/Legal Involvement Pertinent to Current Situation/Hospitalization: No - Comment as needed ? ?Activities of Daily Living ?Home Assistive Devices/Equipment: Cane (specify quad or straight) ?ADL Screening (condition at time of admission) ?Patient's cognitive ability  adequate to safely complete daily activities?: Yes ?Is the patient deaf or have difficulty hearing?: No ?Does the patient have difficulty seeing, even when wearing glasses/contacts?: Yes ?Does the patient have difficulty concentrating, remembering, or making decisions?: Yes ?Patient able to express need for assistance with ADLs?: Yes ?Does the patient have difficulty dressing or bathing?: No ?Independently performs ADLs?: Yes (appropriate for developmental age) ?Does the patient have difficulty walking or climbing stairs?: Yes ?Weakness of Legs: Both ?Weakness of Arms/Hands: Both ? ?Permission Sought/Granted ?Permission sought to share information with : Case Manager, Family Supports, Other (comment) ?Permission granted to share information with : Yes, Verbal Permission Granted ? Share Information with NAME: [name] ? Permission granted to share info w AGENC[facility]: [facility] ? Permission granted to share info w Relationship: [phone] ? Permission granted to share info w Contact Information: daughter ? ?Emotional Assessment ?Appearance:: Appears stated age ?Attitude/Demeanor/Rapport: Engaged, Suspicious ?Affect (typically observed): Calm, Defensive ?Orientation: : Oriented to Self, Oriented to Place, Oriented to  Time, Oriented to Situation ?Alcohol / Substance Use: Not Applicable ?Psych Involvement: No (comment) ? ?Admission diagnosis:  Syncope and collapse [R55] ?Syncope [R55] ?Elevated troponin [R77.8] ?Acute pulmonary embolism ([place]) [I26.99] ?Acute saddle pulmonary embolism (HCC) [I26.92] ?Acute saddle pulmonary embolism with acute cor pulmonale (HCC) [I26.02] ?Patient Active Problem List  ? Diagnosis Date Noted  ? GERD (gastroesophageal reflux disease) 01/08/2022  ? Thrombocytosis 01/08/2022  ? Syncope 01/05/2022  ? Stroke ([place]) 01/05/2022  ? Elevated troponin 01/05/2022  ? Nausea & vomiting 01/05/2022  ? Diabetes mellitus without complication ([place]) 96/22/2979  ?  Acute saddle pulmonary embolism ([place])  01/05/2022  ? Traumatic rhabdomyolysis ([place])   ? Acute cystitis with hematuria   ? Abnormal finding present on diagnostic imaging of uterus   ? Hypotension   ? Weakness   ? Sepsis ([place]) 04/16/2021  ? Malnutrition of moderate degree 12/10/2020  ? COVID-19 virus infection 12/09/2020  ? Acute kidney injury superimposed on CKD ([place]) 12/08/2020  ? Acute CVA (cerebrovascular accident) ([place]) 01/31/2020  ? Episode of transient neurologic symptoms 01/30/2020  ? Hyperglycemia due to type 2 diabetes mellitus ([place]) 01/30/2020  ? Incomplete uterine prolapse 12/08/2017  ? Cystocele, midline 12/08/2017  ? Bipolar affective disorder, manic, severe, with psychotic behavior ([place])   ? Type 2 diabetes mellitus with hyperlipidemia ([place]) 04/01/2016  ? Hyperlipidemia 04/01/2016  ? Brief psychotic disorder ([place]) 03/28/2016  ? Noncompliance 03/28/2016  ? ?PCP:  [name], MD ?Pharmacy:  No Pharmacies Listed ? ? ? ?Social Determinants of Health (SDOH) Interventions ?  ? ?Readmission Risk Interventions ?No flowsheet data found. ? ? ?

## 2022-01-09 NOTE — Evaluation (Signed)
Physical Therapy Evaluation ?Patient Details ?Name: Sabrina [name] ?MRN: [id number] ?DOB: Oct 09, 1946 ?Today's Date: 01/09/2022 ? ?History of Present Illness ? Pt is a 76 y.o. female who presents to the ED w/ complaints of Loss of Consciousness (3/13). Pt reports she attempted to make some oatmeal and believed she passed out and woke up on the floor. Currently admitted for acute saddle pulmonary embolism and symptomatic bilateral pulmonary emobli (3/16). PmHx: DM, HTN, HLD, CVA (4/21). ?  ?Clinical Impression ? Pt resting in bed upon PT entrance into room for today's evaluation. Pt is A&Ox4 and denies any c/o pain at rest. She reports prior to hospitalization she was Independent w/ ADLs and only required assistance PRN. She lives with her daughter and other family members in a 2-story house, but lives on the first floor; 2 threshold STE.  ? ?Pt is able to complete bed mobility w/ SUPERVISION to sit EOB. She is able to don brief w/ CGA at EOB and is able to progress to standing w/ CGA using RW to pull brief all the way up. She was able to ambulate ~51f w/ CGA using RW and [facility] to [facility] the [facility]; able to [facility] to [facility] w/ CGA using RW. Pt resting in recliner prior to PT exit from room at the conclusion of today's session. Pt will benefit from continued skilled PT in order to increase LE strength, improve mobility/gait, and restore PLOF. Current discharge recommendation to HHPT is appropriate due to the level of assistance required by the patient to ensure safety and improve overall function. ?   ?   ? ?Recommendations for follow up therapy are one component of a multi-disciplinary discharge planning process, led by the attending physician.  Recommendations may be updated based on patient status, additional functional criteria and insurance authorization. ? ?Follow Up Recommendations Home health PT ? ?  ?Assistance Recommended at Discharge Intermittent Supervision/Assistance  ?Patient can  return home with the following ? A little help with walking and/or transfers;A little help with bathing/dressing/bathroom;Assistance with cooking/housework;Assist for transportation;Help with stairs or ramp for entrance ? ?  ?Equipment Recommendations    ?Recommendations for Other Services ?    ?  ?Functional Status Assessment Patient has had a recent decline in their functional status and demonstrates the ability to make significant improvements in function in a reasonable and predictable amount of time.  ? ?  ?Precautions / Restrictions Precautions ?Precautions: Fall ?Restrictions ?Weight Bearing Restrictions: No  ? ?  ? ?Mobility ? Bed Mobility ?Overal bed mobility: Needs Assistance ?Bed Mobility: Supine to Sit ?  ?  ?Supine to sit: Supervision ?  ?  ?  ?  ? ?Transfers ?Overall transfer level: Needs assistance ?Equipment used: Rolling walker (2 wheels) ?Transfers: Sit to/from Stand, Bed to chair/wheelchair/BSC ?Sit to Stand: Min guard ?  ?Step pivot transfers: Min guard ?  ?  ?  ?  ?  ? ?Ambulation/Gait ?Ambulation/Gait assistance: Min guard ?Gait Distance (Feet): 60 Feet ?Assistive device: Rolling walker (2 wheels) ?Gait Pattern/deviations: Step-through pattern, Decreased step length - right, Decreased step length - left, Decreased stride length ?Gait velocity: decreased ?  ?  ?  ? ?Stairs ?  ?  ?  ?  ?  ? ?Wheelchair Mobility ?  ? ?Modified Rankin (Stroke Patients Only) ?  ? ?  ? ?Balance Overall balance assessment: Needs assistance ?Sitting-balance support: Feet supported, No upper extremity supported ?Sitting balance-Leahy Scale: Good ?  ?  ?Standing balance support: Bilateral  upper extremity supported, During functional activity, Reliant on assistive device for balance ?Standing balance-Leahy Scale: Good ?  ?  ?  ?  ?  ?  ?  ?  ?  ?  ?  ?  ?   ? ? ? ?Pertinent Vitals/Pain Pain Assessment ?Pain Assessment: No/denies pain  ? ? ?Home Living Family/patient expects to be discharged to:: Private residence ?Living  Arrangements: Children ?Available Help at Discharge: Available 24 hours/day;Family ?Type of Home: House ?Home Access: Stairs to enter ?Entrance Stairs-Rails: None ?Entrance Stairs-Number of Steps: 2 (threshold steps) ?  ?Home Layout: Two level;Able to live on main level with bedroom/bathroom ?Home Equipment: Cane - single Barista (2 wheels);Rollator (4 wheels);Shower seat ?   ?  ?Prior Function Prior Level of Function : Independent/Modified Independent ?  ?  ?  ?  ?  ?  ?  ?  ?  ? ? ?Hand Dominance  ?   ? ?  ?Extremity/Trunk Assessment  ? Upper Extremity Assessment ?Upper Extremity Assessment: Overall WFL for tasks assessed;Generalized weakness ?  ? ?Lower Extremity Assessment ?Lower Extremity Assessment: Overall WFL for tasks assessed;Generalized weakness ?  ? ?   ?Communication  ?    ?Cognition Arousal/Alertness: Awake/alert ?Behavior During Therapy: [facility] for tasks assessed/performed ?Overall Cognitive Status: Within Functional Limits for tasks assessed ?  ?  ?  ?  ?  ?  ?  ?  ?  ?  ?  ?  ?  ?  ?  ?  ?  ?  ?  ? ?  ?General Comments   ? ?  ?Exercises    ? ?Assessment/Plan  ?  ?PT Assessment Patient needs continued PT services  ?PT Problem List Decreased strength;Decreased mobility;Decreased range of motion;Decreased coordination;Decreased activity tolerance;Decreased cognition;Decreased balance;Decreased safety awareness ? ?   ?  ?PT Treatment Interventions DME instruction;Therapeutic exercise;Gait training;Balance training;Stair training;Neuromuscular re-education;Functional mobility training;Therapeutic activities;Patient/family education   ? ?PT Goals (Current goals can be found in the Care Plan section)  ?Acute Rehab PT Goals ?Patient Stated Goal: to get better to go home ?PT Goal Formulation: With patient ?Time For Goal Achievement: 01/23/22 ?Potential to Achieve Goals: Good ? ?  ?Frequency Min 2X/week ?  ? ? ?Co-evaluation   ?  ?  ?  ?  ? ? ?  ?AM-PAC PT "6 Clicks" Mobility  ?Outcome Measure Help  needed turning from your back to your side while in a flat bed without using bedrails?: A Little ?Help needed moving from lying on your back to sitting on the side of a flat bed without using bedrails?: A Little ?Help needed moving to and from a bed to a chair (including a wheelchair)?: A Little ?Help needed standing up from a chair using your arms (e.g., wheelchair or bedside chair)?: A Little ?Help needed to walk in hospital room?: A Little ?Help needed climbing 3-5 steps with a railing? : A Little ?6 Click Score: 18 ? ?  ?End of Session Equipment Utilized During Treatment: Gait belt ?Activity Tolerance: Patient tolerated treatment well ?Patient left: in chair;with chair alarm set;with call bell/phone within reach ?Nurse Communication: Mobility status ?PT Visit Diagnosis: Unsteadiness on feet (R26.81);Muscle weakness (generalized) (M62.81);History of falling (Z91.81) ?  ? ?Time: 0932-6712 ?PT Time Calculation (min) (ACUTE ONLY): 30 min ? ? ?Charges:     ?  ?  ?   ? ?Jonnie Kind, SPT ?01/09/2022, 1:08 PM ? ?

## 2022-01-09 NOTE — Progress Notes (Signed)
[facility] ? ?Daily Progress Note ? ? ?Subjective  -  ? ?No events overnight, feeling better. HR down, BP stable ? ?Objective ?Vitals:  ? 01/09/22 0400 01/09/22 0500 01/09/22 0600 01/09/22 0750  ?BP: (!) 111/59 104/60 (!) 105/56 121/60  ?Pulse: 95 89 87 93  ?Resp: '15 18 18 13  '$ ?Temp:    97.8 ?F (36.6 ?C)  ?TempSrc:    Oral  ?SpO2: 96% 96% 95% 95%  ?Weight:      ?Height:      ? ? ?Intake/Output Summary (Last 24 hours) at 01/09/2022 0810 ?Last data filed at 01/09/2022 0300 ?Gross per 24 hour  ?Intake 643.69 ml  ?Output --  ?Net 643.69 ml  ? ? ?PULM  CTAB ?CV  RRR ?VASC  Access site C/D/I ? ?Laboratory ?CBC ?   ?Component Value Date/Time  ? WBC 6.4 01/09/2022 0323  ? HGB 9.2 (L) 01/09/2022 0323  ? HCT 29.7 (L) 01/09/2022 0323  ? PLT 375 01/09/2022 0323  ? ? ?BMET ?   ?Component Value Date/Time  ? NA 136 01/09/2022 0323  ? K 4.2 01/09/2022 0323  ? CL 104 01/09/2022 0323  ? CO2 22 01/09/2022 0323  ? GLUCOSE 96 01/09/2022 0323  ? BUN 20 01/09/2022 0323  ? CREATININE 1.03 (H) 01/09/2022 0323  ? CALCIUM 8.6 (L) 01/09/2022 0323  ? GFRNONAA 57 (L) 01/09/2022 0323  ? GFRAA >60 02/01/2020 0437  ? ? ?Assessment/Planning: ?POD #1 s/p pulmonary thrombectomy ? ?HR down, oxygen sats up ?OK to transition to oral anticoagulation ?For LE duplex today ? ? ?Leotis Pain ? ?01/09/2022, 8:10 AM ? ? ? ?  ?

## 2022-01-09 NOTE — Progress Notes (Signed)
PHARMACIST - PHYSICIAN COMMUNICATION ? ?CONCERNING: IV to Oral Route Change Policy ? ?RECOMMENDATION: ?This patient is receiving pantoprazole by the intravenous route.  Based on criteria approved by the Pharmacy and Therapeutics Committee, the intravenous medication(s) is/are being converted to the equivalent oral dose form(s). ? ? ?DESCRIPTION: ?These criteria include: ?The patient is eating (either orally or via tube) and/or has been taking other orally administered medications for a least 24 hours ?The patient has no evidence of active gastrointestinal bleeding or impaired GI absorption (gastrectomy, short bowel, patient on TNA or NPO). ? ?If you have questions about this conversion, please contact the Pharmacy Department  ? ?Benita Gutter, RPH ?01/09/2022 7:53 AM  ?

## 2022-01-10 DIAGNOSIS — E871 Hypo-osmolality and hyponatremia: Secondary | ICD-10-CM

## 2022-01-10 DIAGNOSIS — I2602 Saddle embolus of pulmonary artery with acute cor pulmonale: Secondary | ICD-10-CM | POA: Diagnosis not present

## 2022-01-10 DIAGNOSIS — R9389 Abnormal findings on diagnostic imaging of other specified body structures: Secondary | ICD-10-CM | POA: Diagnosis not present

## 2022-01-10 DIAGNOSIS — N179 Acute kidney failure, unspecified: Secondary | ICD-10-CM | POA: Diagnosis not present

## 2022-01-10 DIAGNOSIS — R778 Other specified abnormalities of plasma proteins: Secondary | ICD-10-CM | POA: Diagnosis not present

## 2022-01-10 LAB — BASIC METABOLIC PANEL
Anion gap: 11 (ref 5–15)
BUN: 19 mg/dL (ref 8–23)
CO2: 21 mmol/L — ABNORMAL LOW (ref 22–32)
Calcium: 8.5 mg/dL — ABNORMAL LOW (ref 8.9–10.3)
Chloride: 102 mmol/L (ref 98–111)
Creatinine, Ser: 1.04 mg/dL — ABNORMAL HIGH (ref 0.44–1.00)
GFR, Estimated: 56 mL/min — ABNORMAL LOW (ref >60)
Glucose, Bld: 118 mg/dL — ABNORMAL HIGH (ref 70–99)
Potassium: 4.1 mmol/L (ref 3.5–5.1)
Sodium: 134 mmol/L — ABNORMAL LOW (ref 135–145)

## 2022-01-10 LAB — GLUCOSE, CAPILLARY
Glucose-Capillary: 97 mg/dL (ref 70–99)
Glucose-Capillary: 138 mg/dL — ABNORMAL HIGH (ref 70–99)
Glucose-Capillary: 153 mg/dL — ABNORMAL HIGH (ref 70–99)
Glucose-Capillary: 121 mg/dL — ABNORMAL HIGH (ref 70–99)

## 2022-01-10 LAB — CBC
HCT: 29.2 % — ABNORMAL LOW (ref 36.0–46.0)
Hemoglobin: 9.1 g/dL — ABNORMAL LOW (ref 12.0–15.0)
MCH: 27.7 pg (ref 26.0–34.0)
MCHC: 31.2 g/dL (ref 30.0–36.0)
MCV: 89 fL (ref 80.0–100.0)
Platelets: 400 10*3/uL (ref 150–400)
RBC: 3.28 MIL/uL — ABNORMAL LOW (ref 3.87–5.11)
RDW: 13.2 % (ref 11.5–15.5)
WBC: 6.7 10*3/uL (ref 4.0–10.5)
nRBC: 0 % (ref 0.0–0.2)

## 2022-01-10 MED ORDER — POLYETHYLENE GLYCOL 3350 17 G PO PACK: 17.0000 g | PACK | Freq: Every day | ORAL | Status: DC | PRN

## 2022-01-10 MED ORDER — LIP MEDEX EX OINT
TOPICAL_OINTMENT | CUTANEOUS | Status: DC | PRN
  Filled 2022-01-10: qty 7

## 2022-01-10 MED ORDER — SODIUM CHLORIDE 0.9 % IV SOLN
400.0000 mg | Freq: Once | INTRAVENOUS | Status: AC
  Administered 2022-01-10: 400 mg via INTRAVENOUS
  Filled 2022-01-10: qty 20

## 2022-01-10 NOTE — Assessment & Plan Note (Signed)
Sodium one-point less than the normal range.  No further work-up. ?

## 2022-01-10 NOTE — Progress Notes (Signed)
?Progress Note ? ? ?Patient: Sabrina [name]:[id number] DOB: 1946/05/24 DOA: 01/05/2022     5 ?DOS: the patient was seen and examined on 01/10/2022 ?  ? ? ?Assessment and Plan: ?* Acute saddle pulmonary embolism ([place]) ?Patient is status post thrombolysis and mechanical thrombectomy for saddle pulmonary emboli on 01/08/2022.  Echocardiogram shows normal left ventricular ejection fraction but right ventricular systolic function is mildly reduced with severely elevated pulmonary arterial systolic pressures.  Ultrasound lower extremity negative.  Continue Eliquis. ? ? ?Acute kidney injury superimposed on CKD ([place]) ?Creatinine upon presentation 1.04.  Creatinine on 313 up at 1.32.  Today's creatinine 1.04. Underlying chronic kidney disease stage IIIa ? ?Abnormal finding present on diagnostic imaging of uterus ?Reviewing pelvic ultrasound from 04/16/2021 and CT scan from 04/16/2021.  At that time the patient declined any gynecological work-up.  She has not followed up as outpatient.  With those imaging studies cannot rule out endometrial carcinoma.  Patient has had some intermittent vaginal bleeding.  Hemoglobin 9.1 today.  I gave IV iron today. ? ?Syncope ?Secondary to saddle pulmonary embolism. ? ?Stroke [facility]) ?Prior history of stroke on aspirin and Plavix as outpatient.  Both these medications were discontinued since the patient be on Eliquis upon disposition. ? ?Elevated troponin ?Troponins went up to 420.  Likely secondary to pulmonary embolism.  No complaints of chest pain or shortness of breath. ? ?Thrombocytosis ?Likely acute phase reactant secondary to saddle pulmonary embolism.  Platelet count now in the normal range ? ?Type 2 diabetes mellitus with hyperlipidemia ([place]) ?Patient declined Lipitor.  Patient on glargine insulin and will reduce the dose to 5 units daily. ? ?GERD (gastroesophageal reflux disease) ?Started on Protonix ? ?Hyponatremia ?Sodium one-point less than the normal range.  No further  work-up. ? ? ? ? ?  ? ?Subjective: Patient and daughter this morning wanted the patient to go to rehab.  Physical therapy recommended home with home health.  I consulted TOC and the physical therapist to have the mobility specialist walk with the patient the 160 feet today.  Transitional care team spoke with patient's daughter that he was recommended to go home with home health.  Patient and daughter now agreeable to go home with home health tomorrow. ? ?Physical Exam: ?Vitals:  ? 01/09/22 1659 01/09/22 2006 01/10/22 0441 01/10/22 0837  ?BP:  (!) 104/52 124/67 106/63  ?Pulse:  (!) 106 95 91  ?Resp:  '16 16 16  '$ ?Temp: 98.4 ?F (36.9 ?C) 99.6 ?F (37.6 ?C) 98.7 ?F (37.1 ?C) 98.4 ?F (36.9 ?C)  ?TempSrc: Oral     ?SpO2:  94% 98% 96%  ?Weight:      ?Height:      ? ?Physical Exam ?HENT:  ?   Head: Normocephalic.  ?   Mouth/Throat:  ?   Pharynx: No oropharyngeal exudate.  ?Eyes:  ?   General: Lids are normal.  ?   Conjunctiva/sclera: Conjunctivae normal.  ?Cardiovascular:  ?   Rate and Rhythm: Normal rate and regular rhythm.  ?   Heart sounds: S1 normal and S2 normal. Murmur heard.  ?Systolic murmur is present with a grade of 2/6.  ?Pulmonary:  ?   Breath sounds: Normal breath sounds. No decreased breath sounds, wheezing, rhonchi or rales.  ?Abdominal:  ?   Palpations: Abdomen is soft.  ?   Tenderness: There is no abdominal tenderness.  ?Musculoskeletal:  ?   Right lower leg: No swelling.  ?   Left lower leg: No swelling.  ?Skin: ?  General: Skin is warm.  ?   Findings: No rash.  ?Neurological:  ?   Mental Status: She is alert.  ?  ?Data Reviewed: ?Hemoglobin 9.1, creatinine 1.04, sodium 134 ? ?Family Communication: Spoke with daughter at the bedside this morning and on the phone this afternoon ? ?Disposition: ?Status is: Inpatient ?Remains inpatient appropriate because: We are trying to figure out the best disposition for the patient today.  Patient and family wanted to go to rehab.  We had mobility specialist see the  patient today.  Home health was recommended after walking 160 feet today.  We explained that home health will not be there 7 days a week.  I explained that the patient's daughter will have to help her get to follow-up appointments and will need quite a few follow-up appointments (PMD, hematology, gynecology and vascular surgery.) ? ?I did give IV iron today.   ? ?Will discharge home with home health tomorrow. ? ?Planned Discharge Destination: Home with Home Health ? ? ?Author: ?Loletha Grayer, MD ?01/10/2022 4:50 PM ? ?For on call review [email or web address].  ?

## 2022-01-10 NOTE — Progress Notes (Signed)
Mobility Specialist - Progress Note ? ? ? 01/10/22 1125  ?Mobility  ?Activity Ambulated independently in hallway;Stood at bedside;Dangled on edge of bed  ?Level of Assistance Standby assist, set-up cues, supervision of patient - no hands on  ?Assistive Device Front wheel walker  ?Distance Ambulated (ft) 160 ft  ?Activity Response Tolerated well  ?$Mobility charge 1 Mobility  ? ? ?Pre-mobility: 97 HR, 96% SpO2 ?During mobility: 111 HR, 94% SpO2 ? ?Pt supine upon arrival using RA. Pt completes bed mobility to sit EOB with ModI and STS with supervision. Pt ambulates 147f with supervision (no hands on) --- voicing no complaints. Pt returns to bed with needs in reach. ? ?MMerrily [name]?Mobility Specialist ?01/10/22, 11:28 AM ? ? ? ?

## 2022-01-11 LAB — HEMOGLOBIN: Hemoglobin: 9.5 g/dL — ABNORMAL LOW (ref 12.0–15.0)

## 2022-01-11 LAB — GLUCOSE, CAPILLARY: Glucose-Capillary: 76 mg/dL (ref 70–99)

## 2022-01-11 MED ORDER — POLYETHYLENE GLYCOL 3350 17 G PO PACK: 17.0000 g | PACK | Freq: Every day | ORAL | 0 refills | Status: DC | PRN

## 2022-01-11 MED ORDER — PANTOPRAZOLE SODIUM 40 MG PO TBEC: 40.0000 mg | DELAYED_RELEASE_TABLET | Freq: Every day | ORAL | 0 refills | Status: DC

## 2022-01-11 MED ORDER — APIXABAN 5 MG PO TABS: ORAL_TABLET | ORAL | 0 refills | Status: DC

## 2022-01-11 NOTE — TOC Progression Note (Signed)
Transition of Care (TOC) - Progression Note  ? ? ?Patient Details  ?Name: TANNIS BURSTEIN ?MRN: [id number] ?Date of Birth: 06/06/1946 ? ?Transition of Care (TOC) CM/SW Contact  ?Neftali Thurow E Laurene Melendrez, LCSW ?Phone Number: ?01/11/2022, 8:47 AM ? ?Clinical Narrative:   Eliquis coupon printed to the unit. ? ? ? ?Expected Discharge Plan: [place] ?Barriers to Discharge: Continued Medical Work up ? ?Expected Discharge Plan and Services ?Expected Discharge Plan: [place] ?  ?Discharge Planning Services: CM Consult ?Post Acute Care Choice: Home Health ?Living arrangements for the past 2 months: [place] ?Expected Discharge Date: 01/11/22               ?DME Arranged: 3-N-1 ?DME Agency: [facility], [place] ?Date DME Agency Contacted: 01/09/22 ?Time DME Agency Contacted: [phone] ?Representative spoke with at DME Agency: Andee Poles ?HH Arranged: PT, OT ?[place] Agency: [facility] ?Date HH Agency Contacted: 01/09/22 ?Time [place]: [phone] ?Representative spoke with at [place]: Merleen Nicely ? ? ?Social Determinants of Health (SDOH) Interventions ?  ? ?Readmission Risk Interventions ?No flowsheet data found. ? ?

## 2022-01-11 NOTE — Plan of Care (Signed)
?  Problem: Education: ?Goal: Knowledge of General Education information will improve ?Description: Including pain rating scale, medication(s)/side effects and non-pharmacologic comfort measures ?01/11/2022 0843 by Montel Culver, RN ?Outcome: Adequate for Discharge ?01/11/2022 0740 by Montel Culver, RN ?Outcome: Progressing ?  ?Problem: Health Behavior/Discharge Planning: ?Goal: Ability to manage health-related needs will improve ?01/11/2022 0843 by Montel Culver, RN ?Outcome: Adequate for Discharge ?01/11/2022 0740 by Montel Culver, RN ?Outcome: Progressing ?  ?Problem: Clinical Measurements: ?Goal: Ability to maintain clinical measurements within normal limits will improve ?01/11/2022 0843 by Montel Culver, RN ?Outcome: Adequate for Discharge ?01/11/2022 0740 by Montel Culver, RN ?Outcome: Progressing ?Goal: Will remain free from infection ?01/11/2022 0843 by Montel Culver, RN ?Outcome: Adequate for Discharge ?01/11/2022 0740 by Montel Culver, RN ?Outcome: Progressing ?Goal: Diagnostic test results will improve ?01/11/2022 0843 by Montel Culver, RN ?Outcome: Adequate for Discharge ?01/11/2022 0740 by Montel Culver, RN ?Outcome: Progressing ?Goal: Respiratory complications will improve ?01/11/2022 0843 by Montel Culver, RN ?Outcome: Adequate for Discharge ?01/11/2022 0740 by Montel Culver, RN ?Outcome: Progressing ?Goal: Cardiovascular complication will be avoided ?01/11/2022 0843 by Montel Culver, RN ?Outcome: Adequate for Discharge ?01/11/2022 0740 by Montel Culver, RN ?Outcome: Progressing ?  ?Problem: Activity: ?Goal: Risk for activity intolerance will decrease ?01/11/2022 0843 by Montel Culver, RN ?Outcome: Adequate for Discharge ?01/11/2022 0740 by Montel Culver, RN ?Outcome: Progressing ?  ?Problem: Nutrition: ?Goal: Adequate nutrition will be maintained ?01/11/2022 0843 by Montel Culver, RN ?Outcome: Adequate for Discharge ?01/11/2022 0740 by  Montel Culver, RN ?Outcome: Progressing ?  ?Problem: Coping: ?Goal: Level of anxiety will decrease ?01/11/2022 0843 by Montel Culver, RN ?Outcome: Adequate for Discharge ?01/11/2022 0740 by Montel Culver, RN ?Outcome: Progressing ?  ?Problem: Elimination: ?Goal: Will not experience complications related to bowel motility ?01/11/2022 0843 by Montel Culver, RN ?Outcome: Adequate for Discharge ?01/11/2022 0740 by Montel Culver, RN ?Outcome: Progressing ?Goal: Will not experience complications related to urinary retention ?01/11/2022 0843 by Montel Culver, RN ?Outcome: Adequate for Discharge ?01/11/2022 0740 by Montel Culver, RN ?Outcome: Progressing ?  ?Problem: Pain Managment: ?Goal: General experience of comfort will improve ?01/11/2022 0843 by Montel Culver, RN ?Outcome: Adequate for Discharge ?01/11/2022 0740 by Montel Culver, RN ?Outcome: Progressing ?  ?Problem: Safety: ?Goal: Ability to remain free from injury will improve ?01/11/2022 0843 by Montel Culver, RN ?Outcome: Adequate for Discharge ?01/11/2022 0740 by Montel Culver, RN ?Outcome: Progressing ?  ?Problem: Skin Integrity: ?Goal: Risk for impaired skin integrity will decrease ?01/11/2022 0843 by Montel Culver, RN ?Outcome: Adequate for Discharge ?01/11/2022 0740 by Montel Culver, RN ?Outcome: Progressing ?  ?

## 2022-01-11 NOTE — Plan of Care (Signed)

## 2022-01-11 NOTE — Discharge Summary (Signed)
?Physician Discharge Summary ?  ?Patient: Sabrina [name] MRN: [id number] DOB: 06-21-1946  ?Admit date:     01/05/2022  ?Discharge date: 01/11/22  ?Discharge Physician: Loletha Grayer  ? ?PCP: [name], MD  ? ?Recommendations at discharge:  ? ?Follow-up PCP 5 days ?Follow-up hematology as outpatient to follow-up blood counts closely ?Follow-up vascular surgery ?Follow-up OB/GYN ? ?Discharge Diagnoses: ?Principal Problem: ?  Acute saddle pulmonary embolism ([place]) ?Active Problems: ?  Acute kidney injury superimposed on CKD ([place]) ?  Abnormal finding present on diagnostic imaging of uterus ?  Syncope ?  Stroke [facility]) ?  Elevated troponin ?  Type 2 diabetes mellitus with hyperlipidemia ([place]) ?  Thrombocytosis ?  GERD (gastroesophageal reflux disease) ?  Nausea & vomiting ?  Diabetes mellitus without complication ([place]) ?  Hyponatremia ? ? ?Hospital Course: ?The patient was admitted to the hospital on 01/05/2022 and discharged on 01/11/2022 in the morning.  The patient came in with a syncopal episode and was found to have an acute saddle pulmonary embolism.  The patient was started on heparin drip.  Dr. Lucky Cowboy took to the vascular lab and did a thrombolysis and mechanical thrombectomy.  The patient was placed back on heparin drip and then converted over to Eliquis.  Ultrasound the lower extremity was negative for DVT.  Echocardiogram showed an EF of 70 to 75%, right ventricular systolic function was mildly reduced right ventricular size is moderately enlarged and severely elevated pulmonary arterial systolic pressure (the echocardiogram was done on 01/06/2022 before the thrombectomy).  The patient was discharged home on Eliquis. ? ?Home health set up.  Did well with physical therapy prior to disposition. ? ?Assessment and Plan: ?* Acute saddle pulmonary embolism ([place]) ?Patient is status post thrombolysis and mechanical thrombectomy for saddle pulmonary emboli on 01/08/2022.  Echocardiogram shows normal left ventricular  ejection fraction but right ventricular systolic function is mildly reduced with severely elevated pulmonary arterial systolic pressures.  Ultrasound lower extremity negative.  Continue Eliquis. ? ? ?Acute kidney injury superimposed on CKD ([place]) ?Creatinine upon presentation 1.04.  Creatinine on 313 up at 1.32.  Today's creatinine 1.04. Underlying chronic kidney disease stage IIIa ? ?Abnormal finding present on diagnostic imaging of uterus ?Reviewing pelvic ultrasound from 04/16/2021 and CT scan from 04/16/2021.  At that time the patient declined any gynecological work-up.  She has not followed up as outpatient.  With those imaging studies cannot rule out endometrial carcinoma.  Patient has had some intermittent vaginal bleeding.  Hemoglobin 9.1 today.  I gave IV iron today. ? ?Syncope ?Secondary to saddle pulmonary embolism. ? ?Stroke [facility]) ?Prior history of stroke on aspirin and Plavix as outpatient.  Both these medications were discontinued since the patient be on Eliquis upon disposition. ? ?Elevated troponin ?Troponins went up to 420.  Likely secondary to pulmonary embolism.  No complaints of chest pain or shortness of breath. ? ?Thrombocytosis ?Likely acute phase reactant secondary to saddle pulmonary embolism.  Platelet count now in the normal range ? ?Type 2 diabetes mellitus with hyperlipidemia ([place]) ?Patient declined Lipitor.  Patient on glargine insulin and will reduce the dose to 5 units daily. ? ?GERD (gastroesophageal reflux disease) ?Started on Protonix ? ?Hyponatremia ?Sodium one-point less than the normal range.  No further work-up. ? ? ? ? ?  ? ? ?Consultants: Vascular surgery ?Procedures performed: Thrombectomy and thrombolysis ?Disposition: Home ?Diet recommendation:  ?Carb modified diet ?DISCHARGE MEDICATION: ?Allergies as of 01/11/2022   ? ?   Reactions  ? Glimepiride Diarrhea,  Other (See Comments)  ? Glipizide Other (See Comments)  ? Lisinopril Other (See Comments)  ? headache  ? Losartan Other  (See Comments)  ? Headache  ? Norco [hydrocodone-acetaminophen] Other (See Comments)  ? Mood changes  ? Pioglitazone Swelling  ? On legs  ? Penicillins Diarrhea, Rash  ? ?  ? ?  ?Medication List  ?  ? ?STOP taking these medications   ? ?clopidogrel 75 MG tablet ?Commonly known as: PLAVIX ?  ?insulin detemir 100 UNIT/ML FlexPen ?Commonly known as: LEVEMIR ?  ?ondansetron 4 MG disintegrating tablet ?Commonly known as: ZOFRAN-ODT ?  ?spironolactone 25 MG tablet ?Commonly known as: ALDACTONE ?  ? ?  ? ?TAKE these medications   ? ?apixaban 5 MG Tabs tablet ?Commonly known as: ELIQUIS ?Two tablets twice a day for six days, then One tablet twice a day afterwards ?  ?metFORMIN 500 MG tablet ?Commonly known as: GLUCOPHAGE ?Take 1,000 mg by mouth 2 (two) times daily. ?  ?pantoprazole 40 MG tablet ?Commonly known as: PROTONIX ?Take 1 tablet (40 mg total) by mouth daily. ?  ?polyethylene glycol 17 g packet ?Commonly known as: MIRALAX / GLYCOLAX ?Take 17 g by mouth daily as needed for moderate constipation. ?  ? ?  ? ? Follow-up Information   ? ? Benjaman Kindler, MD Follow up in 2 week(s).   ?Specialty: Obstetrics and Gynecology ?Contact information: ?[address] ?[place] [address] ?[phone] ? ? ?  ?  ? ? [name], MD Follow up in 5 day(s).   ?Specialty: Internal Medicine ?Contact information: ?[place] [address] ?[phone] ? ? ?  ?  ? ? Cammie Sickle, MD Follow up.   ?Specialties: Internal Medicine, Oncology ?Why: hematology follow up for anemia ?Contact information: ?[place] [address] ?[phone] ? ? ?  ?  ? ? Dew, Erskine Squibb, MD Follow up in 3 week(s).   ?Specialties: Vascular Surgery, Radiology, Interventional Cardiology ?Contact information: ?[address] ?[place] [address] ?[phone] ? ? ?  ?  ? ?  ?  ? ?  ? ?Discharge Exam: ?Filed Weights  ? 01/05/22 1216 01/05/22 1720  ?Weight: 63.9 kg 66 kg  ? ?Physical  Exam ?HENT:  ?   Head: Normocephalic.  ?   Mouth/Throat:  ?   Pharynx: No oropharyngeal exudate.  ?Eyes:  ?   General: Lids are normal.  ?   Conjunctiva/sclera: Conjunctivae normal.  ?Cardiovascular:  ?   Rate and Rhythm: Normal rate and regular rhythm.  ?   Heart sounds: S1 normal and S2 normal. Murmur heard.  ?Systolic murmur is present with a grade of 2/6.  ?Pulmonary:  ?   Breath sounds: Normal breath sounds. No decreased breath sounds, wheezing, rhonchi or rales.  ?Abdominal:  ?   Palpations: Abdomen is soft.  ?   Tenderness: There is no abdominal tenderness.  ?Musculoskeletal:  ?   Right lower leg: No swelling.  ?   Left lower leg: No swelling.  ?Skin: ?   General: Skin is warm.  ?   Findings: No rash.  ?Neurological:  ?   Mental Status: She is alert.  ?  ? ?Condition at discharge: stable ? ?The results of significant diagnostics from this hospitalization (including imaging, microbiology, ancillary and laboratory) are listed below for reference.  ? ?Imaging Studies: ?CT HEAD WO CONTRAST (5MM) ? ?Result Date: 01/05/2022 ?CLINICAL DATA:  A 76 year old female presents for evaluation of head  trauma. EXAM: CT HEAD WITHOUT CONTRAST TECHNIQUE: Contiguous axial images were obtained from the base of the skull through the vertex without intravenous contrast. RADIATION DOSE REDUCTION: This exam was performed according to the departmental dose-optimization program which includes automated exposure control, adjustment of the mA and/or kV according to patient size and/or use of iterative reconstruction technique. COMPARISON:  April 16, 2021. FINDINGS: Brain: No evidence of acute infarction, hemorrhage, hydrocephalus, extra-axial collection or mass lesion/mass effect. Signs of atrophy and of chronic microvascular ischemic change similar to prior imaging. Vascular: No hyperdense vessel or unexpected calcification. Skull: Normal. Negative for fracture or focal lesion. Sinuses/Orbits: Visualized paranasal sinuses and orbits are  unremarkable. Other: None IMPRESSION: 1. No acute intracranial process. 2. Signs of atrophy and chronic microvascular ischemic change similar to prior imaging. Electronically Signed   By: Zetta Bills M.D.   O

## 2022-01-14 DIAGNOSIS — I2602 Saddle embolus of pulmonary artery with acute cor pulmonale: Secondary | ICD-10-CM | POA: Diagnosis not present

## 2022-01-14 DIAGNOSIS — E118 Type 2 diabetes mellitus with unspecified complications: Secondary | ICD-10-CM | POA: Diagnosis not present

## 2022-01-14 DIAGNOSIS — Z794 Long term (current) use of insulin: Secondary | ICD-10-CM | POA: Diagnosis not present

## 2022-01-14 DIAGNOSIS — Z79899 Other long term (current) drug therapy: Secondary | ICD-10-CM | POA: Diagnosis not present

## 2022-01-14 DIAGNOSIS — I10 Essential (primary) hypertension: Secondary | ICD-10-CM | POA: Diagnosis not present

## 2022-01-14 DIAGNOSIS — N939 Abnormal uterine and vaginal bleeding, unspecified: Secondary | ICD-10-CM | POA: Diagnosis not present

## 2022-01-20 DIAGNOSIS — N950 Postmenopausal bleeding: Secondary | ICD-10-CM | POA: Diagnosis not present

## 2022-01-20 DIAGNOSIS — R9389 Abnormal findings on diagnostic imaging of other specified body structures: Secondary | ICD-10-CM | POA: Diagnosis not present

## 2022-01-22 ENCOUNTER — Telehealth: Payer: Self-pay

## 2022-01-22 NOTE — Telephone Encounter (Signed)
Received referral from Dr. Shary Decamp for gyn oncology. Called home and mobile numbers to arrange appointment. No answer and both voicemails are full. Unable to leave message. ?

## 2022-01-23 ENCOUNTER — Telehealth: Payer: Self-pay

## 2022-01-23 DIAGNOSIS — N939 Abnormal uterine and vaginal bleeding, unspecified: Secondary | ICD-10-CM | POA: Diagnosis not present

## 2022-01-23 NOTE — Telephone Encounter (Signed)
Spoke to daughter, [name]. She confirmed that no prior biopsies have been obtained per patient preference. Appointment has been arranged with gyn oncology for 4/5 at 1445. ?

## 2022-01-27 DIAGNOSIS — N950 Postmenopausal bleeding: Secondary | ICD-10-CM | POA: Insufficient documentation

## 2022-01-27 NOTE — Progress Notes (Signed)
Gynecologic Oncology Consult Visit  ? ?Referring Provider: Dr Leafy Ro ? ?Chief Concern: postmenopausal bleeding, concern for endometrial cancer ?Subjective:  ?Sabrina [name] is a 76 y.o. female who is seen in consultation from [facility] for postmenopausal bleeding and CT scan 6/22 concerning for endometrial cancer. ? ?She has some weakness, leg pain, not eating well and having some vomiting. Dr Doy Hutching suggested we order lab work to see if she needed to be admitted.  ? ?The patient was admitted to the hospital 01/05/2022 and discharged on 01/11/2022.  The patient came in with a syncopal episode and was found to have an acute saddle pulmonary embolism.  The patient was started on heparin drip.  Dr. Lucky Cowboy took to the vascular lab and did a thrombolysis and mechanical thrombectomy.  The patient was placed back on heparin drip and then converted over to Eliquis.  Ultrasound the lower extremity was negative for DVT.  Echocardiogram showed an EF of 70 to 75%, right ventricular systolic function was mildly reduced right ventricular size is moderately enlarged and severely elevated pulmonary arterial systolic pressure (the echocardiogram was done on 01/06/2022 before the thrombectomy).  The patient was discharged home on Eliquis. ? ?Principal Problem: ?  Acute saddle pulmonary embolism ([place]) ?Active Problems: ?  Acute kidney injury superimposed on CKD ([place]) ?  Abnormal finding present on diagnostic imaging of uterus ?  Syncope ?  Stroke [facility]) ?  Elevated troponin ?  Type 2 diabetes mellitus with hyperlipidemia ([place]) ?  Thrombocytosis ?  GERD (gastroesophageal reflux disease) ?  Nausea & vomiting ?  Diabetes mellitus without complication ([place]) ?  Hyponatremia ? ?Developed PMB on anticoagulation while in hospital and saw Dr Leafy Ro.   ? ?Previously in 04/16/21 was admitted for weakness/sepsis.  Work up included CT scan concerning for uterine cancer.  ?CT ABDOMEN AND PELVIS WITHOUT CONTRAST ?   ?COMPARISON:  CT 06/16/2019,  04/17/2010 ? FINDINGS: ?Lower chest: Included lung bases are clear. Extensive coronary ?artery atherosclerosis. ?  ?Hepatobiliary: Unremarkable unenhanced appearance of the liver. No ?focal liver lesion identified. Gallbladder within normal limits. No ?hyperdense gallstone. No biliary dilatation. ?  ?Pancreas: Unremarkable. No pancreatic ductal dilatation or ?surrounding inflammatory changes. ?  ?Spleen: Normal in size without focal abnormality. ?  ?Adrenals/Urinary Tract: Adrenal glands are unremarkable. Kidneys are ?normal, without renal calculi, focal lesion, or hydronephrosis. ?Urinary bladder is within normal limits for the degree of ?distension. Pelvic floor laxity with mild cystocele. ?  ?Stomach/Bowel: Stomach is within normal limits. Appendix appears ?normal (series 2, image 51). No evidence of bowel wall thickening, ?distention, or inflammatory changes. ?  ?Vascular/Lymphatic: Aortic atherosclerosis. No enlarged abdominal or ?pelvic lymph nodes. Small right external iliac chain lymph node is ?unchanged in size from prior. ?  ?Reproductive: Abnormal appearance of the uterus with prominent fluid ?distension of the endometrial cavity and areas of surface nodularity ?(series 2, images 69-73). Probable exophytic posterior uterine body ?fibroid appears unchanged from prior. No adnexal abnormality. ?  ?Other: No free fluid. No abdominopelvic fluid collection. No ?pneumoperitoneum. No abdominal wall hernia. Well circumscribed 2.1 ?cm peripherally calcified lesion anterior to the liver is unchanged ?compared to previous CT from 2011, benign. ?  ?Musculoskeletal: No acute or significant osseous findings. Moderate ?arthropathy of the right hip joint. Facet predominant degenerative ?changes within the lumbar spine. No acute osseous abnormality. No ?suspicious bone lesion. ?  ?IMPRESSION: ?1. Abnormal appearance of the uterus with prominent fluid distension ?of the endometrial cavity and areas of surface nodularity.  Findings ?are concerning  for endometrial malignancy. Further evaluation with ?pelvic ultrasound is recommended. ?2. Pelvic floor laxity with mild cystocele. ?3. Aortic and coronary artery atherosclerosis (ICD10-I70.0). ?  ? ?TVUS 03/2021 at [facility] for thickened endometrium on CT ?IMPRESSION:  ?Dilated and thickened endometrial canal with fluid and hematocrit  ?level consistent with hemorrhage. Multiple areas of nodularity are  ?noted. These are highly suspicious for endometrial carcinoma.  ?Gynecologic referral is recommended for tissue sampling and  ?diagnosis.  ? ?Problem List: ?Patient Active Problem List  ? Diagnosis Date Noted  ? Hyponatremia 01/10/2022  ? GERD (gastroesophageal reflux disease) 01/08/2022  ? Thrombocytosis 01/08/2022  ? Syncope 01/05/2022  ? Stroke ([place]) 01/05/2022  ? Elevated troponin 01/05/2022  ? Nausea & vomiting 01/05/2022  ? Diabetes mellitus without complication ([place]) 28/36/6294  ? Acute saddle pulmonary embolism ([place]) 01/05/2022  ? Traumatic rhabdomyolysis ([place])   ? Acute cystitis with hematuria   ? Abnormal finding present on diagnostic imaging of uterus   ? Hypotension   ? Weakness   ? Sepsis ([place]) 04/16/2021  ? Malnutrition of moderate degree 12/10/2020  ? COVID-19 virus infection 12/09/2020  ? Acute kidney injury superimposed on CKD ([place]) 12/08/2020  ? Acute CVA (cerebrovascular accident) ([place]) 01/31/2020  ? Episode of transient neurologic symptoms 01/30/2020  ? Hyperglycemia due to type 2 diabetes mellitus ([place]) 01/30/2020  ? Incomplete uterine prolapse 12/08/2017  ? Cystocele, midline 12/08/2017  ? Bipolar affective disorder, manic, severe, with psychotic behavior ([place])   ? Type 2 diabetes mellitus with hyperlipidemia ([place]) 04/01/2016  ? Hyperlipidemia 04/01/2016  ? Brief psychotic disorder ([place]) 03/28/2016  ? Noncompliance 03/28/2016  ? ? ?Past Medical History: ?Past Medical History:  ?Diagnosis Date  ? Diabetes mellitus without complication ([place])   ? High cholesterol   ?  Hypertension   ? ? ?Past Surgical History: ?Past Surgical History:  ?Procedure Laterality Date  ? DILATION AND CURETTAGE OF UTERUS    ? POLYPECTOMY    ? PULMONARY THROMBECTOMY Bilateral 01/08/2022  ? Procedure: PULMONARY THROMBECTOMY;  Surgeon: Algernon Huxley, MD;  Location: [facility] [facility];  Service: Cardiovascular;  Laterality: Bilateral;  ?  ? ?OB History:  ?OB History  ?Gravida Para Term Preterm AB Living  ?'5 3 3   2 3  '$ ?SAB IAB Ectopic Multiple Live Births  ?'1 1     3  '$ ?  ?# Outcome Date GA Lbr Len/2nd Weight Sex Delivery Anes PTL Lv  ?5 SAB           ?4 IAB           ?3 Term      Vag-Spont   LIV  ?2 Term      Vag-Spont   LIV  ?1 Term      Vag-Spont   LIV  ? ? ?Family History: ?Family History  ?Problem Relation Age of Onset  ? Breast cancer Maternal Aunt   ?     2 aunts in their 49's  ? Breast cancer Maternal Aunt 71  ? Ovarian cancer Neg Hx   ? Colon cancer Neg Hx   ? ? ?Social History: ?Social History  ? ?Socioeconomic History  ? Marital status: Divorced  ?  Spouse name: Not on file  ? Number of children: Not on file  ? Years of education: Not on file  ? Highest education level: Not on file  ?Occupational History  ? Occupation: retired  ?Tobacco Use  ? Smoking status: Never  ? Smokeless tobacco: Never  ?Vaping Use  ?  Vaping Use: Never used  ?Substance and Sexual Activity  ? Alcohol use: No  ? Drug use: No  ? Sexual activity: Not Currently  ?  Birth control/protection: Post-menopausal  ?Other Topics Concern  ? Not on file  ?Social History Narrative  ? Not on file  ? ?Social Determinants of Health  ? ?Financial Resource Strain: Not on file  ?Food Insecurity: Not on file  ?Transportation Needs: Not on file  ?Physical Activity: Not on file  ?Stress: Not on file  ?Social Connections: Not on file  ?Intimate Partner Violence: Not on file  ? ? ?Allergies: ?Allergies  ?Allergen Reactions  ? Glimepiride Diarrhea and Other (See Comments)  ? Glipizide Other (See Comments)  ? Lisinopril Other (See Comments)  ?  headache   ? Losartan Other (See Comments)  ?  Headache  ? Norco [Hydrocodone-Acetaminophen] Other (See Comments)  ?  Mood changes  ? Pioglitazone Swelling  ?  On legs  ? Penicillins Diarrhea and Rash  ? ? ?Current Medications: ?C

## 2022-01-28 ENCOUNTER — Other Ambulatory Visit: Payer: Self-pay

## 2022-01-28 ENCOUNTER — Telehealth: Payer: Self-pay

## 2022-01-28 ENCOUNTER — Inpatient Hospital Stay
Admission: EM | Admit: 2022-01-28 | Discharge: 2022-02-03 | DRG: 682 | Disposition: A | Discharge status: Skilled Nursing Facility | Payer: Medicare HMO | Attending: Internal Medicine | Admitting: Internal Medicine

## 2022-01-28 ENCOUNTER — Inpatient Hospital Stay: Payer: Medicare HMO | Attending: Obstetrics and Gynecology | Admitting: Obstetrics and Gynecology

## 2022-01-28 ENCOUNTER — Inpatient Hospital Stay: Payer: Medicare HMO

## 2022-01-28 ENCOUNTER — Encounter: Payer: Self-pay | Admitting: Emergency Medicine

## 2022-01-28 DIAGNOSIS — E8721 Acute metabolic acidosis: Secondary | ICD-10-CM | POA: Diagnosis present

## 2022-01-28 DIAGNOSIS — D631 Anemia in chronic kidney disease: Secondary | ICD-10-CM | POA: Diagnosis present

## 2022-01-28 DIAGNOSIS — Z7984 Long term (current) use of oral hypoglycemic drugs: Secondary | ICD-10-CM

## 2022-01-28 DIAGNOSIS — N179 Acute kidney failure, unspecified: Secondary | ICD-10-CM | POA: Diagnosis not present

## 2022-01-28 DIAGNOSIS — D500 Iron deficiency anemia secondary to blood loss (chronic): Secondary | ICD-10-CM | POA: Diagnosis present

## 2022-01-28 DIAGNOSIS — Z86711 Personal history of pulmonary embolism: Secondary | ICD-10-CM

## 2022-01-28 DIAGNOSIS — N950 Postmenopausal bleeding: Secondary | ICD-10-CM

## 2022-01-28 DIAGNOSIS — E7800 Pure hypercholesterolemia, unspecified: Secondary | ICD-10-CM | POA: Diagnosis present

## 2022-01-28 DIAGNOSIS — E1122 Type 2 diabetes mellitus with diabetic chronic kidney disease: Secondary | ICD-10-CM | POA: Diagnosis present

## 2022-01-28 DIAGNOSIS — K219 Gastro-esophageal reflux disease without esophagitis: Secondary | ICD-10-CM | POA: Diagnosis present

## 2022-01-28 DIAGNOSIS — N390 Urinary tract infection, site not specified: Secondary | ICD-10-CM | POA: Diagnosis present

## 2022-01-28 DIAGNOSIS — N1831 Chronic kidney disease, stage 3a: Secondary | ICD-10-CM | POA: Diagnosis present

## 2022-01-28 DIAGNOSIS — R180 Malignant ascites: Secondary | ICD-10-CM | POA: Insufficient documentation

## 2022-01-28 DIAGNOSIS — F0392 Unspecified dementia, unspecified severity, with psychotic disturbance: Secondary | ICD-10-CM | POA: Diagnosis present

## 2022-01-28 DIAGNOSIS — M6281 Muscle weakness (generalized): Secondary | ICD-10-CM | POA: Diagnosis not present

## 2022-01-28 DIAGNOSIS — F03918 Unspecified dementia, unspecified severity, with other behavioral disturbance: Secondary | ICD-10-CM | POA: Diagnosis not present

## 2022-01-28 DIAGNOSIS — I2510 Atherosclerotic heart disease of native coronary artery without angina pectoris: Secondary | ICD-10-CM | POA: Diagnosis present

## 2022-01-28 DIAGNOSIS — K767 Hepatorenal syndrome: Secondary | ICD-10-CM | POA: Diagnosis present

## 2022-01-28 DIAGNOSIS — I10 Essential (primary) hypertension: Secondary | ICD-10-CM | POA: Diagnosis not present

## 2022-01-28 DIAGNOSIS — E113212 Type 2 diabetes mellitus with mild nonproliferative diabetic retinopathy with macular edema, left eye: Secondary | ICD-10-CM | POA: Diagnosis not present

## 2022-01-28 DIAGNOSIS — R4586 Emotional lability: Secondary | ICD-10-CM | POA: Diagnosis present

## 2022-01-28 DIAGNOSIS — Z880 Allergy status to penicillin: Secondary | ICD-10-CM

## 2022-01-28 DIAGNOSIS — D75839 Thrombocytosis, unspecified: Secondary | ICD-10-CM | POA: Diagnosis present

## 2022-01-28 DIAGNOSIS — I700 Atherosclerosis of aorta: Secondary | ICD-10-CM | POA: Diagnosis present

## 2022-01-28 DIAGNOSIS — Z7189 Other specified counseling: Secondary | ICD-10-CM | POA: Diagnosis not present

## 2022-01-28 DIAGNOSIS — C549 Malignant neoplasm of corpus uteri, unspecified: Secondary | ICD-10-CM | POA: Diagnosis present

## 2022-01-28 DIAGNOSIS — N1832 Chronic kidney disease, stage 3b: Secondary | ICD-10-CM | POA: Insufficient documentation

## 2022-01-28 DIAGNOSIS — Z8616 Personal history of COVID-19: Secondary | ICD-10-CM

## 2022-01-28 DIAGNOSIS — N939 Abnormal uterine and vaginal bleeding, unspecified: Secondary | ICD-10-CM | POA: Diagnosis present

## 2022-01-28 DIAGNOSIS — E8729 Other acidosis: Secondary | ICD-10-CM | POA: Diagnosis present

## 2022-01-28 DIAGNOSIS — K760 Fatty (change of) liver, not elsewhere classified: Secondary | ICD-10-CM | POA: Diagnosis present

## 2022-01-28 DIAGNOSIS — R809 Proteinuria, unspecified: Secondary | ICD-10-CM | POA: Diagnosis not present

## 2022-01-28 DIAGNOSIS — E876 Hypokalemia: Secondary | ICD-10-CM | POA: Diagnosis present

## 2022-01-28 DIAGNOSIS — R188 Other ascites: Secondary | ICD-10-CM

## 2022-01-28 DIAGNOSIS — E8770 Fluid overload, unspecified: Secondary | ICD-10-CM | POA: Diagnosis present

## 2022-01-28 DIAGNOSIS — M8580 Other specified disorders of bone density and structure, unspecified site: Secondary | ICD-10-CM | POA: Diagnosis present

## 2022-01-28 DIAGNOSIS — K5730 Diverticulosis of large intestine without perforation or abscess without bleeding: Secondary | ICD-10-CM | POA: Diagnosis not present

## 2022-01-28 DIAGNOSIS — E119 Type 2 diabetes mellitus without complications: Secondary | ICD-10-CM | POA: Insufficient documentation

## 2022-01-28 DIAGNOSIS — C541 Malignant neoplasm of endometrium: Secondary | ICD-10-CM | POA: Diagnosis present

## 2022-01-28 DIAGNOSIS — I129 Hypertensive chronic kidney disease with stage 1 through stage 4 chronic kidney disease, or unspecified chronic kidney disease: Secondary | ICD-10-CM | POA: Diagnosis present

## 2022-01-28 DIAGNOSIS — Z91199 Patient's noncompliance with other medical treatment and regimen due to unspecified reason: Secondary | ICD-10-CM

## 2022-01-28 DIAGNOSIS — E871 Hypo-osmolality and hyponatremia: Secondary | ICD-10-CM | POA: Diagnosis not present

## 2022-01-28 DIAGNOSIS — N170 Acute kidney failure with tubular necrosis: Secondary | ICD-10-CM | POA: Diagnosis present

## 2022-01-28 DIAGNOSIS — R531 Weakness: Secondary | ICD-10-CM | POA: Diagnosis not present

## 2022-01-28 DIAGNOSIS — F319 Bipolar disorder, unspecified: Secondary | ICD-10-CM | POA: Insufficient documentation

## 2022-01-28 DIAGNOSIS — E1169 Type 2 diabetes mellitus with other specified complication: Secondary | ICD-10-CM | POA: Diagnosis present

## 2022-01-28 DIAGNOSIS — E785 Hyperlipidemia, unspecified: Secondary | ICD-10-CM | POA: Diagnosis present

## 2022-01-28 DIAGNOSIS — R4182 Altered mental status, unspecified: Secondary | ICD-10-CM | POA: Diagnosis not present

## 2022-01-28 DIAGNOSIS — I2699 Other pulmonary embolism without acute cor pulmonale: Secondary | ICD-10-CM | POA: Diagnosis not present

## 2022-01-28 DIAGNOSIS — J90 Pleural effusion, not elsewhere classified: Secondary | ICD-10-CM | POA: Diagnosis present

## 2022-01-28 DIAGNOSIS — Z888 Allergy status to other drugs, medicaments and biological substances status: Secondary | ICD-10-CM

## 2022-01-28 DIAGNOSIS — I2602 Saddle embolus of pulmonary artery with acute cor pulmonale: Secondary | ICD-10-CM | POA: Diagnosis not present

## 2022-01-28 DIAGNOSIS — D509 Iron deficiency anemia, unspecified: Secondary | ICD-10-CM | POA: Insufficient documentation

## 2022-01-28 DIAGNOSIS — Z79899 Other long term (current) drug therapy: Secondary | ICD-10-CM

## 2022-01-28 DIAGNOSIS — Z794 Long term (current) use of insulin: Secondary | ICD-10-CM

## 2022-01-28 DIAGNOSIS — E1165 Type 2 diabetes mellitus with hyperglycemia: Secondary | ICD-10-CM | POA: Diagnosis present

## 2022-01-28 DIAGNOSIS — N2581 Secondary hyperparathyroidism of renal origin: Secondary | ICD-10-CM | POA: Diagnosis present

## 2022-01-28 DIAGNOSIS — I2692 Saddle embolus of pulmonary artery without acute cor pulmonale: Secondary | ICD-10-CM | POA: Diagnosis present

## 2022-01-28 DIAGNOSIS — F312 Bipolar disorder, current episode manic severe with psychotic features: Secondary | ICD-10-CM | POA: Diagnosis present

## 2022-01-28 DIAGNOSIS — K5900 Constipation, unspecified: Secondary | ICD-10-CM | POA: Diagnosis not present

## 2022-01-28 DIAGNOSIS — Z8673 Personal history of transient ischemic attack (TIA), and cerebral infarction without residual deficits: Secondary | ICD-10-CM

## 2022-01-28 DIAGNOSIS — Z7901 Long term (current) use of anticoagulants: Secondary | ICD-10-CM

## 2022-01-28 DIAGNOSIS — D259 Leiomyoma of uterus, unspecified: Secondary | ICD-10-CM | POA: Diagnosis not present

## 2022-01-28 DIAGNOSIS — N189 Chronic kidney disease, unspecified: Secondary | ICD-10-CM | POA: Diagnosis not present

## 2022-01-28 DIAGNOSIS — R079 Chest pain, unspecified: Secondary | ICD-10-CM | POA: Diagnosis not present

## 2022-01-28 LAB — COMPREHENSIVE METABOLIC PANEL
ALT: 14 U/L (ref 0–44)
AST: 27 U/L (ref 15–41)
Albumin: 3.1 g/dL — ABNORMAL LOW (ref 3.5–5.0)
Alkaline Phosphatase: 57 U/L (ref 38–126)
Anion gap: 12 (ref 5–15)
BUN: 30 mg/dL — ABNORMAL HIGH (ref 8–23)
CO2: 22 mmol/L (ref 22–32)
Calcium: 9 mg/dL (ref 8.9–10.3)
Chloride: 100 mmol/L (ref 98–111)
Creatinine, Ser: 2.7 mg/dL — ABNORMAL HIGH (ref 0.44–1.00)
GFR, Estimated: 18 mL/min — ABNORMAL LOW (ref >60)
Glucose, Bld: 120 mg/dL — ABNORMAL HIGH (ref 70–99)
Potassium: 3.8 mmol/L (ref 3.5–5.1)
Sodium: 134 mmol/L — ABNORMAL LOW (ref 135–145)
Total Bilirubin: 0.7 mg/dL (ref 0.3–1.2)
Total Protein: 7.2 g/dL (ref 6.5–8.1)

## 2022-01-28 LAB — CBC WITH DIFFERENTIAL/PLATELET
Abs Immature Granulocytes: 0.03 10*3/uL (ref 0.00–0.07)
Basophils Absolute: 0 10*3/uL (ref 0.0–0.1)
Basophils Relative: 0 %
Eosinophils Absolute: 0 10*3/uL (ref 0.0–0.5)
Eosinophils Relative: 0 %
HCT: 32.4 % — ABNORMAL LOW (ref 36.0–46.0)
Hemoglobin: 10.1 g/dL — ABNORMAL LOW (ref 12.0–15.0)
Immature Granulocytes: 1 %
Lymphocytes Relative: 17 %
Lymphs Abs: 1.1 10*3/uL (ref 0.7–4.0)
MCH: 27.7 pg (ref 26.0–34.0)
MCHC: 31.2 g/dL (ref 30.0–36.0)
MCV: 89 fL (ref 80.0–100.0)
Monocytes Absolute: 0.4 10*3/uL (ref 0.1–1.0)
Monocytes Relative: 7 %
Neutro Abs: 4.7 10*3/uL (ref 1.7–7.7)
Neutrophils Relative %: 75 %
Platelets: 641 10*3/uL — ABNORMAL HIGH (ref 150–400)
RBC: 3.64 MIL/uL — ABNORMAL LOW (ref 3.87–5.11)
RDW: 14 % (ref 11.5–15.5)
WBC: 6.2 10*3/uL (ref 4.0–10.5)
nRBC: 0 % (ref 0.0–0.2)

## 2022-01-28 NOTE — Telephone Encounter (Signed)
Sabrina Rutter NP has reviewed labs and creatinine is double her baseline. Clinically concerned for obstructive process in the setting of probable malignancy. Recommend imaging to evaluate further management based on findings.Called daughter, Sabrina [name] to instruct to take her to the ED. No answer, left voicemail. Will try again shortly. ?

## 2022-01-28 NOTE — ED Provider Notes (Signed)
? ?[facility] ?Provider Note ? ? ? Event Date/Time  ? First MD Initiated Contact with Patient 01/28/22 2302   ?  (approximate) ? ? ?History  ? ?Weakness ? ? ?HPI ? ?Sabrina [name] is a 76 y.o. female with a history of diabetes, hyperlipidemia, hypertension, recently diagnosed saddle PE status post thrombectomy 3 weeks ago now on Eliquis, questionable abnormal endometrium, CVA who is sent by her PCP for acute kidney injury.  Patient reports that since she was discharged home from her last hospitalization 3 weeks ago that she has not been doing well.  She has had progressively worsening generalized weakness and decreased appetite.  She has been complaining of pain on the right groin at the site of her thrombectomy.  Today she went to oncology for an endometrial biopsy and after that went to see her primary care doctor because she had not feeling well.  Labs were done which showed acute kidney injury and patient was instructed to come to the ER.  Patient has noticed swelling of her legs and swelling of her abdomen that has been ongoing for about a week.  She has been off of Eliquis for 4 days in preparation for the endometrial biopsy.  Last dose of Lasix was yesterday.  She denies chest pain or shortness of breath.  Has had no fever. ?  ? ? ?Past Medical History:  ?Diagnosis Date  ? Diabetes mellitus without complication ([place])   ? High cholesterol   ? Hypertension   ? ? ?Past Surgical History:  ?Procedure Laterality Date  ? DILATION AND CURETTAGE OF UTERUS    ? POLYPECTOMY    ? PULMONARY THROMBECTOMY Bilateral 01/08/2022  ? Procedure: PULMONARY THROMBECTOMY;  Surgeon: Algernon Huxley, MD;  Location: [place] [facility];  Service: Cardiovascular;  Laterality: Bilateral;  ? ? ? ?Physical Exam  ? ?Triage Vital Signs: ?ED Triage Vitals  ?Enc Vitals Group  ?   BP 01/28/22 2253 121/61  ?   Pulse Rate 01/28/22 2253 86  ?   Resp 01/28/22 2253 18  ?   Temp 01/28/22 2253 98.1 ?F (36.7 ?C)  ?   Temp Source  01/28/22 2253 Oral  ?   SpO2 01/28/22 2253 100 %  ?   Weight 01/28/22 2300 141 lb 1.5 oz (64 kg)  ?   Height 01/28/22 2300 '5\' 2"'$  (1.575 m)  ?   Head Circumference --   ?   Peak Flow --   ?   Pain Score 01/28/22 2253 7  ?   Pain Loc --   ?   Pain Edu? --   ?   Excl. in [place]? --   ? ? ?Most recent vital signs: ?Vitals:  ? 01/29/22 0030 01/29/22 0200  ?BP: 121/61 (!) 97/51  ?Pulse: 85 66  ?Resp: 15 13  ?Temp:    ?SpO2: 100% 97%  ? ? ? ?Constitutional: Alert and oriented. Well appearing and in no apparent distress. ?HEENT: ?     Head: Normocephalic and atraumatic.    ?     Eyes: Conjunctivae are normal. Sclera is non-icteric.  ?     Mouth/Throat: Mucous membranes are moist.  ?     Neck: Supple with no signs of meningismus. ?Cardiovascular: Regular rate and rhythm. No murmurs, gallops, or rubs. 2+ symmetrical distal pulses are present in all extremities.  ?Respiratory: Normal respiratory effort. Lungs are clear to auscultation bilaterally.  ?Gastrointestinal: Soft, distended and diffusely tender on the lower quadrants with  positive bowel sounds. No rebound or guarding. ?Genitourinary: No CVA tenderness. ?Musculoskeletal:   R groin was inspected and has no swelling, erythema, palpable masses, bruising, bruits, or tenderness. There is 1+ pitting edema of the LLE ?Neurologic: Normal speech and language. Face is symmetric. Moving all extremities. No gross focal neurologic deficits are appreciated. ?Skin: Skin is warm, dry and intact. No rash noted. ?Psychiatric: Mood and affect are normal. Speech and behavior are normal. ? ?ED Results / Procedures / Treatments  ? ?Labs ?(all labs ordered are listed, but only abnormal results are displayed) ?Labs Reviewed  ?CBC WITH DIFFERENTIAL/PLATELET - Abnormal; Notable for the following components:  ?    Result Value  ? RBC 3.57 (*)   ? Hemoglobin 9.7 (*)   ? HCT 31.6 (*)   ? Platelets 613 (*)   ? All other components within normal limits  ?COMPREHENSIVE METABOLIC PANEL - Abnormal; Notable  for the following components:  ? CO2 17 (*)   ? Glucose, Bld 130 (*)   ? BUN 32 (*)   ? Creatinine, Ser 3.07 (*)   ? Albumin 2.9 (*)   ? GFR, Estimated 15 (*)   ? Anion gap 17 (*)   ? All other components within normal limits  ?URINALYSIS, COMPLETE (UACMP) WITH MICROSCOPIC  ?TROPONIN I (HIGH SENSITIVITY)  ?TROPONIN I (HIGH SENSITIVITY)  ? ? ? ?EKG ? ?ED ECG REPORT ?I, Rudene Re, the attending physician, personally viewed and interpreted this ECG. ? ?Sinus rhythm with a rate of 85, low voltage QRS, borderline prolonged QTc, no ST elevations or depressions. ? ?RADIOLOGY ?I, Rudene Re, attending MD, have personally viewed and interpreted the images obtained during this visit as below: ? ?CT showing large volume ascites which looks worse when compared to prior from 3 weeks ago.  ? ?___________________________________________________ ?Interpretation by Radiologist:  ?[place] ? ?Result Date: 01/29/2022 ?CLINICAL DATA:  Weakness and abdominal swelling. Increasing constipation. Last CT was abnormal concerning for uterine neoplasm, unknown what follow-up may have been done following the CT reading. There is note made of a dilatation and curettage of the uterus in the patient's surgical history but the date is unknown. Most recently the patient underwent mechanical pulmonary arterial thrombectomy on 01/08/2022. EXAM: CT ABDOMEN AND PELVIS WITHOUT CONTRAST TECHNIQUE: Multidetector CT imaging of the abdomen and pelvis was performed following the standard protocol without IV contrast. RADIATION DOSE REDUCTION: This exam was performed according to the departmental dose-optimization program which includes automated exposure control, adjustment of the mA and/or kV according to patient size and/or use of iterative reconstruction technique. COMPARISON:  CT without contrast 04/16/2021, CT with IV and oral contrast 06/16/2019 FINDINGS: Lower chest: There is a small to moderate-sized left and small  size layering right pleural effusions with adjacent consolidation or atelectasis in the left-greater-than-right lower lobes. The cardiac size is normal. Coronary arteries are heavily calcified with calcifications in the aortic valve plane. Hepatobiliary: 14 cm in length liver with normal noncontrast attenuation. The gallbladder and bile ducts are unremarkable. Some images suggest slight capsular nodularity anteriorly in the left lobe lateral segment which may be seen with early cirrhosis. Pancreas: No focal abnormality or ductal dilatation. Spleen: No focal abnormality or splenomegaly. Adrenals/Urinary Tract: There is no adrenal mass or focal renal cortical abnormality visible without contrast. There is no evidence of urinary stones or obstruction. There is no bladder thickening. Stomach/Bowel: The bowel is displaced inward by large-volume abdominal and pelvic free ascites. The appendix is normal in caliber  and no small bowel obstruction is seen. Stomach mostly surrounded by ascites with the small-bowel variably obscured by ascites insinuating into the mesenteric folds. The wall of the large intestine is not focally remarkable. Scattered sigmoid diverticulosis. Vascular/Lymphatic: The aorta and iliac arteries are heavily calcified. There is no AAA. There is ill-defined heterogeneous masslike abnormality within the upper left pelvic component of the fluid difficult to measure but could measure as much as 5.5 x 3.7 by 7.5 cm on 2:59 and 5:51. There is no further masslike abnormality in the fluid visible without contrast. Reproductive: The uterus is in-situ and no longer demonstrates fluid distention in the cavity. The ovaries are not identified. They're probably within the fluid. Again noted is a 2.7 cm pedunculated fibroid of the posterior uterine fundus. Other: There is a large volume of abdominal and pelvic free ascites ranging in density from 12 up to 24 Hounsfield units. There is nodular thickening extending  across the omentum in the upper abdomen, highly worrisome for omental metastases and not seen previously. Possibility of malignant ascites is favored. 2.6 cm chronic calcified mass in the mesentery in the ant

## 2022-01-28 NOTE — ED Triage Notes (Addendum)
Pt presents via POV with complaints of weakness for the last several days. She notes that she was taking medication to help induce a BM and since that time she has been feeling weaker and was hypotensive at her PCP. Of note, she was seen by the Cancer center for possible intrauterine cancer per the pts daughter. Also, the patients daughter is concerned for AKI due to her abnormal labs at her PCP. Denies CP or SOB.  ? ?Pt has a hx of dementia with aggressive behavior per family. ?

## 2022-01-28 NOTE — Telephone Encounter (Signed)
Second voicemail left with daughter, [name], with recommendations to proceed to the ED. Also called Ms. Gehl's home and mobile number. Voice mail on those are full. Unable to leave message. ?

## 2022-01-29 ENCOUNTER — Encounter: Payer: Self-pay | Admitting: Internal Medicine

## 2022-01-29 ENCOUNTER — Inpatient Hospital Stay: Payer: Medicare HMO

## 2022-01-29 ENCOUNTER — Emergency Department: Payer: Medicare HMO

## 2022-01-29 DIAGNOSIS — D259 Leiomyoma of uterus, unspecified: Secondary | ICD-10-CM | POA: Diagnosis not present

## 2022-01-29 DIAGNOSIS — C541 Malignant neoplasm of endometrium: Secondary | ICD-10-CM | POA: Diagnosis present

## 2022-01-29 DIAGNOSIS — Z7189 Other specified counseling: Secondary | ICD-10-CM | POA: Diagnosis not present

## 2022-01-29 DIAGNOSIS — K219 Gastro-esophageal reflux disease without esophagitis: Secondary | ICD-10-CM | POA: Diagnosis present

## 2022-01-29 DIAGNOSIS — N390 Urinary tract infection, site not specified: Secondary | ICD-10-CM | POA: Diagnosis present

## 2022-01-29 DIAGNOSIS — K5900 Constipation, unspecified: Secondary | ICD-10-CM | POA: Diagnosis not present

## 2022-01-29 DIAGNOSIS — E1122 Type 2 diabetes mellitus with diabetic chronic kidney disease: Secondary | ICD-10-CM | POA: Diagnosis present

## 2022-01-29 DIAGNOSIS — I2510 Atherosclerotic heart disease of native coronary artery without angina pectoris: Secondary | ICD-10-CM | POA: Diagnosis present

## 2022-01-29 DIAGNOSIS — I700 Atherosclerosis of aorta: Secondary | ICD-10-CM | POA: Diagnosis present

## 2022-01-29 DIAGNOSIS — F312 Bipolar disorder, current episode manic severe with psychotic features: Secondary | ICD-10-CM | POA: Diagnosis present

## 2022-01-29 DIAGNOSIS — F0392 Unspecified dementia, unspecified severity, with psychotic disturbance: Secondary | ICD-10-CM | POA: Diagnosis present

## 2022-01-29 DIAGNOSIS — E1169 Type 2 diabetes mellitus with other specified complication: Secondary | ICD-10-CM | POA: Diagnosis present

## 2022-01-29 DIAGNOSIS — N939 Abnormal uterine and vaginal bleeding, unspecified: Secondary | ICD-10-CM | POA: Diagnosis present

## 2022-01-29 DIAGNOSIS — K760 Fatty (change of) liver, not elsewhere classified: Secondary | ICD-10-CM | POA: Diagnosis present

## 2022-01-29 DIAGNOSIS — D631 Anemia in chronic kidney disease: Secondary | ICD-10-CM | POA: Diagnosis present

## 2022-01-29 DIAGNOSIS — R531 Weakness: Secondary | ICD-10-CM | POA: Diagnosis not present

## 2022-01-29 DIAGNOSIS — N1831 Chronic kidney disease, stage 3a: Secondary | ICD-10-CM | POA: Diagnosis present

## 2022-01-29 DIAGNOSIS — E1165 Type 2 diabetes mellitus with hyperglycemia: Secondary | ICD-10-CM | POA: Diagnosis present

## 2022-01-29 DIAGNOSIS — R188 Other ascites: Secondary | ICD-10-CM | POA: Diagnosis not present

## 2022-01-29 DIAGNOSIS — I129 Hypertensive chronic kidney disease with stage 1 through stage 4 chronic kidney disease, or unspecified chronic kidney disease: Secondary | ICD-10-CM | POA: Diagnosis present

## 2022-01-29 DIAGNOSIS — R079 Chest pain, unspecified: Secondary | ICD-10-CM | POA: Diagnosis not present

## 2022-01-29 DIAGNOSIS — E8721 Acute metabolic acidosis: Secondary | ICD-10-CM | POA: Diagnosis present

## 2022-01-29 DIAGNOSIS — E871 Hypo-osmolality and hyponatremia: Secondary | ICD-10-CM | POA: Diagnosis not present

## 2022-01-29 DIAGNOSIS — E7800 Pure hypercholesterolemia, unspecified: Secondary | ICD-10-CM | POA: Diagnosis present

## 2022-01-29 DIAGNOSIS — R809 Proteinuria, unspecified: Secondary | ICD-10-CM | POA: Diagnosis not present

## 2022-01-29 DIAGNOSIS — D500 Iron deficiency anemia secondary to blood loss (chronic): Secondary | ICD-10-CM | POA: Diagnosis present

## 2022-01-29 DIAGNOSIS — C549 Malignant neoplasm of corpus uteri, unspecified: Secondary | ICD-10-CM | POA: Diagnosis present

## 2022-01-29 DIAGNOSIS — R180 Malignant ascites: Secondary | ICD-10-CM | POA: Diagnosis present

## 2022-01-29 DIAGNOSIS — N179 Acute kidney failure, unspecified: Secondary | ICD-10-CM

## 2022-01-29 DIAGNOSIS — K767 Hepatorenal syndrome: Secondary | ICD-10-CM | POA: Diagnosis present

## 2022-01-29 DIAGNOSIS — N170 Acute kidney failure with tubular necrosis: Secondary | ICD-10-CM | POA: Diagnosis present

## 2022-01-29 DIAGNOSIS — K5730 Diverticulosis of large intestine without perforation or abscess without bleeding: Secondary | ICD-10-CM | POA: Diagnosis not present

## 2022-01-29 DIAGNOSIS — Z8616 Personal history of COVID-19: Secondary | ICD-10-CM | POA: Diagnosis not present

## 2022-01-29 DIAGNOSIS — J90 Pleural effusion, not elsewhere classified: Secondary | ICD-10-CM | POA: Diagnosis present

## 2022-01-29 DIAGNOSIS — I10 Essential (primary) hypertension: Secondary | ICD-10-CM | POA: Diagnosis not present

## 2022-01-29 DIAGNOSIS — D75839 Thrombocytosis, unspecified: Secondary | ICD-10-CM | POA: Diagnosis present

## 2022-01-29 DIAGNOSIS — E8729 Other acidosis: Secondary | ICD-10-CM | POA: Diagnosis present

## 2022-01-29 DIAGNOSIS — F03918 Unspecified dementia, unspecified severity, with other behavioral disturbance: Secondary | ICD-10-CM | POA: Diagnosis not present

## 2022-01-29 DIAGNOSIS — N189 Chronic kidney disease, unspecified: Secondary | ICD-10-CM

## 2022-01-29 HISTORY — DX: Malignant neoplasm of endometrium: C54.1

## 2022-01-29 LAB — GLUCOSE, PLEURAL OR PERITONEAL FLUID: Glucose, Fluid: 84 mg/dL

## 2022-01-29 LAB — CBC WITH DIFFERENTIAL/PLATELET
Abs Immature Granulocytes: 0.02 10*3/uL (ref 0.00–0.07)
Basophils Absolute: 0 10*3/uL (ref 0.0–0.1)
Basophils Relative: 0 %
Eosinophils Absolute: 0 10*3/uL (ref 0.0–0.5)
Eosinophils Relative: 1 %
HCT: 31.6 % — ABNORMAL LOW (ref 36.0–46.0)
Hemoglobin: 9.7 g/dL — ABNORMAL LOW (ref 12.0–15.0)
Immature Granulocytes: 0 %
Lymphocytes Relative: 15 %
Lymphs Abs: 1.1 10*3/uL (ref 0.7–4.0)
MCH: 27.2 pg (ref 26.0–34.0)
MCHC: 30.7 g/dL (ref 30.0–36.0)
MCV: 88.5 fL (ref 80.0–100.0)
Monocytes Absolute: 0.5 10*3/uL (ref 0.1–1.0)
Monocytes Relative: 7 %
Neutro Abs: 5.6 10*3/uL (ref 1.7–7.7)
Neutrophils Relative %: 77 %
Platelets: 613 10*3/uL — ABNORMAL HIGH (ref 150–400)
RBC: 3.57 MIL/uL — ABNORMAL LOW (ref 3.87–5.11)
RDW: 14 % (ref 11.5–15.5)
WBC: 7.2 10*3/uL (ref 4.0–10.5)
nRBC: 0 % (ref 0.0–0.2)

## 2022-01-29 LAB — COMPREHENSIVE METABOLIC PANEL
ALT: 14 U/L (ref 0–44)
AST: 27 U/L (ref 15–41)
Albumin: 2.9 g/dL — ABNORMAL LOW (ref 3.5–5.0)
Alkaline Phosphatase: 54 U/L (ref 38–126)
Anion gap: 17 — ABNORMAL HIGH (ref 5–15)
BUN: 32 mg/dL — ABNORMAL HIGH (ref 8–23)
CO2: 17 mmol/L — ABNORMAL LOW (ref 22–32)
Calcium: 9 mg/dL (ref 8.9–10.3)
Chloride: 102 mmol/L (ref 98–111)
Creatinine, Ser: 3.07 mg/dL — ABNORMAL HIGH (ref 0.44–1.00)
GFR, Estimated: 15 mL/min — ABNORMAL LOW (ref >60)
Glucose, Bld: 130 mg/dL — ABNORMAL HIGH (ref 70–99)
Potassium: 3.9 mmol/L (ref 3.5–5.1)
Sodium: 136 mmol/L (ref 135–145)
Total Bilirubin: 0.8 mg/dL (ref 0.3–1.2)
Total Protein: 7 g/dL (ref 6.5–8.1)

## 2022-01-29 LAB — TROPONIN I (HIGH SENSITIVITY): Troponin I (High Sensitivity): 8 ng/L (ref <18)

## 2022-01-29 LAB — URINALYSIS, COMPLETE (UACMP) WITH MICROSCOPIC
Bilirubin Urine: NEGATIVE
Glucose, UA: NEGATIVE mg/dL
Ketones, ur: 5 mg/dL — AB
Nitrite: NEGATIVE
Protein, ur: 100 mg/dL — AB
Specific Gravity, Urine: 1.016 (ref 1.005–1.030)
WBC, UA: >50 WBC/hpf — ABNORMAL HIGH (ref 0–5)
pH: 5 (ref 5.0–8.0)

## 2022-01-29 LAB — ALBUMIN, PLEURAL OR PERITONEAL FLUID: Albumin, Fluid: 2.5 g/dL

## 2022-01-29 LAB — LACTATE DEHYDROGENASE, PLEURAL OR PERITONEAL FLUID: LD, Fluid: 849 U/L — ABNORMAL HIGH (ref 3–23)

## 2022-01-29 LAB — CBG MONITORING, ED: Glucose-Capillary: 122 mg/dL — ABNORMAL HIGH (ref 70–99)

## 2022-01-29 LAB — GLUCOSE, CAPILLARY
Glucose-Capillary: 117 mg/dL — ABNORMAL HIGH (ref 70–99)
Glucose-Capillary: 127 mg/dL — ABNORMAL HIGH (ref 70–99)
Glucose-Capillary: 104 mg/dL — ABNORMAL HIGH (ref 70–99)
Glucose-Capillary: 139 mg/dL — ABNORMAL HIGH (ref 70–99)
Glucose-Capillary: 121 mg/dL — ABNORMAL HIGH (ref 70–99)

## 2022-01-29 LAB — BODY FLUID CELL COUNT WITH DIFFERENTIAL
Eos, Fluid: 0 %
Lymphs, Fluid: 76 %
Monocyte-Macrophage-Serous Fluid: 16 %
Neutrophil Count, Fluid: 8 %
Total Nucleated Cell Count, Fluid: 1401 cu mm

## 2022-01-29 LAB — CA 125: Cancer Antigen (CA) 125: 720 U/mL — ABNORMAL HIGH (ref 0.0–38.1)

## 2022-01-29 MED ORDER — ALBUMIN HUMAN 25 % IV SOLN
12.5000 g | Freq: Once | INTRAVENOUS | Status: AC
  Administered 2022-01-29: 12.5 g via INTRAVENOUS
  Filled 2022-01-29: qty 50

## 2022-01-29 MED ORDER — ACETAMINOPHEN 325 MG PO TABS
650.0000 mg | ORAL_TABLET | Freq: Four times a day (QID) | ORAL | Status: AC | PRN
  Administered 2022-01-30: 650 mg via ORAL
  Filled 2022-01-29 (×2): qty 2

## 2022-01-29 MED ORDER — LACTATED RINGERS IV SOLN: INTRAVENOUS | Status: DC

## 2022-01-29 MED ORDER — SODIUM CHLORIDE 0.9 % IV SOLN
1.0000 g | INTRAVENOUS | Status: DC
  Administered 2022-01-29 – 2022-01-31 (×3): 1 g via INTRAVENOUS
  Filled 2022-01-29: qty 1
  Filled 2022-01-29 (×2): qty 10

## 2022-01-29 MED ORDER — ENOXAPARIN SODIUM 30 MG/0.3ML IJ SOSY
30.0000 mg | PREFILLED_SYRINGE | INTRAMUSCULAR | Status: DC
  Administered 2022-01-29 – 2022-02-03 (×6): 30 mg via SUBCUTANEOUS
  Filled 2022-01-29 (×6): qty 0.3

## 2022-01-29 MED ORDER — ALBUMIN HUMAN 25 % IV SOLN
12.5000 g | Freq: Once | INTRAVENOUS | Status: AC
  Administered 2022-01-29: 12.5 g via INTRAVENOUS
  Filled 2022-01-29 (×2): qty 50

## 2022-01-29 MED ORDER — PROCHLORPERAZINE EDISYLATE 10 MG/2ML IJ SOLN
10.0000 mg | Freq: Four times a day (QID) | INTRAMUSCULAR | Status: DC | PRN
  Filled 2022-01-29: qty 2

## 2022-01-29 MED ORDER — LACTATED RINGERS IV BOLUS
1000.0000 mL | Freq: Once | INTRAVENOUS | Status: AC
  Administered 2022-01-29: 1000 mL via INTRAVENOUS

## 2022-01-29 MED ORDER — ALBUMIN HUMAN 25 % IV SOLN
12.5000 g | Freq: Two times a day (BID) | INTRAVENOUS | Status: DC
Start: 2022-01-29 — End: 2022-01-29
  Filled 2022-01-29: qty 50

## 2022-01-29 MED ORDER — INSULIN ASPART 100 UNIT/ML IJ SOLN
0.0000 [IU] | INTRAMUSCULAR | Status: DC
  Administered 2022-01-30 – 2022-02-01 (×3): 1 [IU] via SUBCUTANEOUS
  Filled 2022-01-29 (×3): qty 1

## 2022-01-29 NOTE — Plan of Care (Signed)
Consult for [facility] noted. Arrived initially and nursing was working with patient. Returned at this time and patient is not in room.  Will reattempt at another time.  ?

## 2022-01-29 NOTE — Plan of Care (Signed)

## 2022-01-29 NOTE — ED Notes (Signed)
PT at the bedside at this time.

## 2022-01-29 NOTE — H&P (Signed)
?History and Physical ? ?Sabrina [name] LNL:[id number] DOB: 1946/04/09 DOA: 01/28/2022 ? ?Referring physician: Dr. Alfred Levins, [place]  ?PCP: [name], MD  ?Outpatient Specialists: Gynecology/oncology ?Patient coming from: Home, referred by her PCP. ? ?Chief Complaint: Abnormal labs and generalized weakness. ? ?HPI: Sabrina [name] is a 76 y.o. female with medical history significant for type 2 diabetes, hyperlipidemia, hypertension, recently diagnosed saddle PE status post thrombectomy 3 weeks ago, now on Eliquis, CVA, postmenopausal bleeding with concern for endometrial cancer who presented to [facility] ED at the recommendation of her primary care provider due to abnormal labs.  Her creatinine was elevated.  Additionally, endorses generalized weakness since her discharge from the hospital 3 weeks ago.  Associated with lower abdominal pain.  She was advised by her primary care provider to come to the ED for further evaluation.  Upon presentation to the ED, work-up revealed AKI on CKD 3A, volume overload, large volume ascites with CT findings worrisome for omental metastasis, questionable early cirrhosis of the left lobe of the liver, left greater than right pleural effusions.  Patient received IV fluid bolus LR 1 L x 1 in the ED. TRH, hospitalist team, was asked to admit. ? ?ED Course: Tmax 98.1.  BP 134/61, pulse 87, respiratory 17, saturation 96% on room air.  Lab studies remarkable for serum bicarb 17, BUN 32, creatinine 3.07, GFR 16, anion gap 17. ? ?Review of Systems: ?Review of systems as noted in the HPI. All other systems reviewed and are negative. ? ? ?Past Medical History:  ?Diagnosis Date  ? Diabetes mellitus without complication ([place])   ? High cholesterol   ? Hypertension   ? ?Past Surgical History:  ?Procedure Laterality Date  ? DILATION AND CURETTAGE OF UTERUS    ? POLYPECTOMY    ? PULMONARY THROMBECTOMY Bilateral 01/08/2022  ? Procedure: PULMONARY THROMBECTOMY;  Surgeon: Algernon Huxley, MD;  Location: [facility];  Service: Cardiovascular;  Laterality: Bilateral;  ? ? ?Social History:  reports that she has never smoked. She has never used smokeless tobacco. She reports that she does not drink alcohol and does not use drugs. ? ? ?Allergies  ?Allergen Reactions  ? Glimepiride Diarrhea and Other (See Comments)  ? Glipizide Other (See Comments)  ? Lisinopril Other (See Comments)  ?  headache  ? Losartan Other (See Comments)  ?  Headache  ? Norco [Hydrocodone-Acetaminophen] Other (See Comments)  ?  Mood changes  ? Pioglitazone Swelling  ?  On legs  ? Penicillins Diarrhea and Rash  ? ? ?Family History  ?Problem Relation Age of Onset  ? Breast cancer Maternal Aunt   ?     2 aunts in their 51's  ? Breast cancer Maternal Aunt 38  ? Ovarian cancer Neg Hx   ? Colon cancer Neg Hx   ?  ? ? ?Prior to Admission medications   ?Medication Sig Start Date End Date Taking? Authorizing Provider  ?Accu-Chek Softclix Lancets lancets  01/23/22   [provider]  ?Accu-Chek Softclix Lancets lancets Use to check blood sugar twice a day as directed. 01/23/22   [provider]  ?apixaban (ELIQUIS) 5 MG TABS tablet Two tablets twice a day for six days, then One tablet twice a day afterwards ?Patient not taking: Reported on 01/28/2022 01/11/22   Loletha Grayer, MD  ?furosemide (LASIX) 20 MG tablet Take by mouth. ?Patient not taking: Reported on 01/28/2022 01/20/22 01/20/23  [provider]  ?insulin detemir (LEVEMIR) 100 UNIT/ML injection Inject  100 Units into the skin daily.    [provider]  ?metFORMIN (GLUCOPHAGE) 500 MG tablet Take 1,000 mg by mouth 2 (two) times daily. 09/10/21   [provider]  ?ondansetron (ZOFRAN-ODT) 4 MG disintegrating tablet Take by mouth. 01/04/22   [provider]  ?pantoprazole (PROTONIX) 40 MG tablet Take 1 tablet (40 mg total) by mouth daily. 01/11/22   Loletha Grayer, MD  ?polyethylene glycol (MIRALAX / GLYCOLAX) 17 g packet Take 17 g by mouth daily as  needed for moderate constipation. 01/11/22   Loletha Grayer, MD  ?spironolactone (ALDACTONE) 25 MG tablet Take 25 mg by mouth daily.    [provider]  ? ? ?Physical Exam: ?BP 134/61   Pulse 87   Temp 98.1 ?F (36.7 ?C) (Oral)   Resp 17   Ht '5\' 2"'$  (1.575 m)   Wt 64 kg   SpO2 96%   BMI 25.81 kg/m?  ? ?General: 76 y.o. year-old female well developed well nourished in no acute distress.  Alert and oriented x3. ?Cardiovascular: Regular rate and rhythm with no rubs or gallops.  No thyromegaly or JVD noted.  2+ pitting edema in lower extremities bilaterally.   ?Respiratory: Clear to auscultation with no wheezes or rales. Good inspiratory effort. ?Abdomen: Distended, tender with palpation in lower abdominal quadrants.  With normal bowel sounds x4 quadrants. ?Muskuloskeletal: No cyanosis or clubbing.  2+ pitting edema lower extremities bilaterally.   ?Neuro: CN II-XII intact, strength, sensation, reflexes ?Skin: No ulcerative lesions noted or rashes ?Psychiatry: Judgement and insight appear normal. Mood is appropriate for condition and setting ?   ?   ?   ?Labs on Admission:  ?Basic Metabolic Panel: ?Recent Labs  ?Lab 01/28/22 ?1558 01/29/22 ?0011  ?NA 134* 136  ?K 3.8 3.9  ?CL 100 102  ?CO2 22 17*  ?GLUCOSE 120* 130*  ?BUN 30* 32*  ?CREATININE 2.70* 3.07*  ?CALCIUM 9.0 9.0  ? ?Liver Function Tests: ?Recent Labs  ?Lab 01/28/22 ?1558 01/29/22 ?0011  ?AST 27 27  ?ALT 14 14  ?ALKPHOS 57 54  ?BILITOT 0.7 0.8  ?PROT 7.2 7.0  ?ALBUMIN 3.1* 2.9*  ? ?No results for input(s): LIPASE, AMYLASE in the last 168 hours. ?No results for input(s): AMMONIA in the last 168 hours. ?CBC: ?Recent Labs  ?Lab 01/28/22 ?1603 01/29/22 ?0011  ?WBC 6.2 7.2  ?NEUTROABS 4.7 5.6  ?HGB 10.1* 9.7*  ?HCT 32.4* 31.6*  ?MCV 89.0 88.5  ?PLT 641* 613*  ? ?Cardiac Enzymes: ?No results for input(s): CKTOTAL, CKMB, CKMBINDEX, TROPONINI in the last 168 hours. ? ?BNP (last 3 results) ?Recent Labs  ?  01/05/22 ?1819  ?BNP 682.7*  ? ? ?ProBNP (last 3  results) ?No results for input(s): PROBNP in the last 8760 hours. ? ?CBG: ?No results for input(s): GLUCAP in the last 168 hours. ? ?Radiological Exams on Admission: ?CT ABDOMEN PELVIS WO CONTRAST ? ?Result Date: 01/29/2022 ?CLINICAL DATA:  Weakness and abdominal swelling. Increasing constipation. Last CT was abnormal concerning for uterine neoplasm, unknown what follow-up may have been done following the CT reading. There is note made of a dilatation and curettage of the uterus in the patient's surgical history but the date is unknown. Most recently the patient underwent mechanical pulmonary arterial thrombectomy on 01/08/2022. EXAM: CT ABDOMEN AND PELVIS WITHOUT CONTRAST TECHNIQUE: Multidetector CT imaging of the abdomen and pelvis was performed following the standard protocol without IV contrast. RADIATION DOSE REDUCTION: This exam was performed according to the departmental dose-optimization program which includes  automated exposure control, adjustment of the mA and/or kV according to patient size and/or use of iterative reconstruction technique. COMPARISON:  CT without contrast 04/16/2021, CT with IV and oral contrast 06/16/2019 FINDINGS: Lower chest: There is a small to moderate-sized left and small size layering right pleural effusions with adjacent consolidation or atelectasis in the left-greater-than-right lower lobes. The cardiac size is normal. Coronary arteries are heavily calcified with calcifications in the aortic valve plane. Hepatobiliary: 14 cm in length liver with normal noncontrast attenuation. The gallbladder and bile ducts are unremarkable. Some images suggest slight capsular nodularity anteriorly in the left lobe lateral segment which may be seen with early cirrhosis. Pancreas: No focal abnormality or ductal dilatation. Spleen: No focal abnormality or splenomegaly. Adrenals/Urinary Tract: There is no adrenal mass or focal renal cortical abnormality visible without contrast. There is no evidence  of urinary stones or obstruction. There is no bladder thickening. Stomach/Bowel: The bowel is displaced inward by large-volume abdominal and pelvic free ascites. The appendix is normal in caliber and no small bowe

## 2022-01-29 NOTE — Progress Notes (Signed)
OT Cancellation Note ? ?Patient Details ?Name: Sabrina [name] ?MRN: [id number] ?DOB: Nov 30, 1945 ? ? ?Cancelled Treatment:    Reason Eval/Treat Not Completed: Patient declined, no reason specified;Patient at procedure or test/ unavailable. Order received and chart reviewed. Attempted x3 this date, pt transferring rooms x2 attempts and third attempt pt reports fatigue, defers this date. Will follow up next date as able.  ? ?Dessie Coma, M.S. OTR/L  ?01/29/22, 4:02 PM  ?ascom [phone] ? ?

## 2022-01-29 NOTE — Progress Notes (Signed)
?  Progress Note ? ? ?Patient: Sabrina [name] HGD:[id number] DOB: 07-21-1946 DOA: 01/28/2022     0 ?DOS: the patient was seen and examined on 01/29/2022 ?  ?Brief hospital course: ?KASSIE KENG is a 76 y.o. female with medical history significant for type 2 diabetes, hyperlipidemia, hypertension, recently diagnosed saddle PE status post thrombectomy 3 weeks ago, now on Eliquis, CVA, postmenopausal bleeding with concern for endometrial cancer who presented to [facility] ED at the recommendation of her primary care provider due to abnormal labs with elevated creatinine.  Patient reported generalized weakness since discharge 3 weeks ago, abdominal pain and distention. ? ?ED evaluation consistent with AKI, creatinine 3.07, BUN 32 with anion gap metabolic acidosis.  Vitals were stable and otherwise labs unremarkable. ? ?Same day as admission rounding note. ?Please see full H&P by Dr. Nevada Crane for detailed assessment and plan. ? ?Assessment and Plan: ?See H&P for full assessment and plan, I have reviewed in detail and agree with any changes outlined below: ? ?--Renal ultrasound pending ?--Consult oncology ?--Consult palliative care ?--Follow up peritoneal fluid culture and cytology ?--PT and OT evaluations ?--Started on Rocephin for UTI ? ? ?  ? ?Subjective: Patient seen awake resting in bed, in ICU this morning.  She reports feeling fair.  She feels cold and requests another warm blanket.  She complains of abdominal distention.  She complains of pain in the right groin since her thrombectomy few weeks ago.  Denies fevers or chills.  No other acute complaints at this time. ? ?Physical Exam: ?Vitals:  ? 01/29/22 1300 01/29/22 1301 01/29/22 1400 01/29/22 1511  ?BP: 132/65 132/65 (!) 122/55 (!) 123/51  ?Pulse: 81 82 77 88  ?Resp: 16 13 (!) 21 18  ?Temp:    97.8 ?F (36.6 ?C)  ?TempSrc:      ?SpO2: 100% 100% 100% 100%  ?Weight:      ?Height:      ? ?General exam: awake, alert, no acute distress ?HEENT: moist mucus membranes, hearing grossly  normal  ?Respiratory system: CTAB, no wheezes, rales or rhonchi, normal respiratory effort. ?Cardiovascular system: normal S1/S2, RRR, no JVD, murmurs, rubs, gallops, no pedal edema.   ?Gastrointestinal system: Distended mildly tender. ?Central nervous system: A&O x3. no gross focal neurologic deficits, normal speech ?Extremities: moves all, no edema, normal tone ?Skin: Right groin incision from a prior vascular procedure appears healing well without surrounding warmth erythema or induration ?Psychiatry: normal mood, congruent affect, judgement and insight appear normal ? ? ?Data Reviewed: ? ?Labs reviewed notable for bicarb 17, glucose 130, BUN 32, creatinine 3.07, anion gap 17, albumin 2.9, GFR 15, hemoglobin 9.7, platelets 613.  Urinalysis appears consistent with infection with moderate leukocytes, many bacteria, greater than 50 WBCs.  Also with ketonuria and hemoglobinuria ? ?Family Communication: None at bedside, will attempt to call as time allows ? ?Disposition: ?Status is: Inpatient ?Remains inpatient appropriate because: Severity of illness with ongoing evaluation not appropriate for the outpatient setting, also on IV antibiotics pending urine cultures. ? ? ? Planned Discharge Destination: Home ? ? ? ?Time spent: 35 minutes including time spent at bedside, and in coordination of care with consultants and ancillary staff, reviewing of patient's medical records including labs, imaging studies, outpatient encounters. ? ?Author: ?Ezekiel Slocumb, DO ?01/29/2022 7:37 PM ? ?For on call review [email or web address].  ?

## 2022-01-29 NOTE — Evaluation (Signed)
Physical Therapy Evaluation ?Patient Details ?Name: Sabrina [name] ?MRN: [id number] ?DOB: [birth year] ?Today's Date: 01/29/2022 ? ?History of Present Illness ? 76 y.o. female who was here ~4 week prior after passing out/Loss of Consciousness, had thrombectomy bilateral pulmonary emobli (3/16).  She is now sent by PCP with abnormal lab values, abdominal pain, acute kidney injury, and generalized weakness. PmHx: concern for endometrial cancer/mets, DM, HTN, HLD, CVA (4/21).  ?Clinical Impression ? Pt initially feeling too weak to do much activity but with some encouragement/cuing she was willing to try some PT and did ultimately walk ~30 ft in the room.  She had slow, labored, hunched posture and reported increased pain (Abdominal) and fatigue with the modest effort.  She showed good but limited effort.  Pt overall not at her baseline and agrees with PT that a stint in rehab to get stronger and build tolerance is a good idea.   ?   ? ?Recommendations for follow up therapy are one component of a multi-disciplinary discharge planning process, led by the attending physician.  Recommendations may be updated based on patient status, additional functional criteria and insurance authorization. ? ?Follow Up Recommendations Skilled nursing-short term rehab (<3 hours/day) ? ?  ?Assistance Recommended at Discharge Intermittent Supervision/Assistance  ?Patient can return home with the following ? A little help with walking and/or transfers;A little help with bathing/dressing/bathroom;Assistance with cooking/housework;Assist for transportation;Help with stairs or ramp for entrance ? ?  ?Equipment Recommendations None recommended by PT  ?Recommendations for Other Services ?    ?  ?Functional Status Assessment Patient has had a recent decline in their functional status and demonstrates the ability to make significant improvements in function in a reasonable and predictable amount of time.  ? ?  ?Precautions / Restrictions  Precautions ?Precautions: Fall ?Restrictions ?Weight Bearing Restrictions: No  ? ?  ? ?Mobility ? Bed Mobility ?Overal bed mobility: Needs Assistance ?Bed Mobility: Supine to Sit, Sit to Supine ?  ?  ?Supine to sit: Supervision ?Sit to supine: Mod assist ?  ?General bed mobility comments: Pt able to get herself up to sitting EOB slowly but w/o heavy assist.  She could not lift LEs back up into bed and needed considerable direct assist to get them up into bed ?  ? ?Transfers ?Overall transfer level: Needs assistance ?Equipment used: Rolling walker (2 wheels) ?Transfers: Sit to/from Stand ?Sit to Stand: Min guard ?  ?  ?  ?  ?  ?General transfer comment: Pt was able to rise to standing relatively easily, her height to bed height ratio made this relatively easily.  She did need to use step-stool to get herlsef back sitting up into bed ?  ? ?Ambulation/Gait ?Ambulation/Gait assistance: Min assist ?Gait Distance (Feet): 30 Feet ?Assistive device: Rolling walker (2 wheels) ?  ?  ?  ?  ?General Gait Details: Pt with forward flexed posture, reliant on the walker/UEs. Pt's O2 dropped from high to low 90s with the effort, HR stayed below 100.  Pt quick to fatigue and with very slow and labored effort for this modest distance. ? ?Stairs ?  ?  ?  ?  ?  ? ?Wheelchair Mobility ?  ? ?Modified Rankin (Stroke Patients Only) ?  ? ?  ? ?Balance Overall balance assessment: Needs assistance ?Sitting-balance support: Feet supported, No upper extremity supported ?Sitting balance-Leahy Scale: Good ?  ?  ?Standing balance support: Bilateral upper extremity supported, During functional activity, Reliant on assistive device for balance ?Standing balance-Leahy Scale: Fair ?Standing  balance comment: relaint on walker and poor tolerance ?  ?  ?  ?  ?  ?  ?  ?  ?  ?  ?  ?   ? ? ? ?Pertinent Vitals/Pain Pain Assessment ?Pain Assessment: 0-10 ?Pain Score: 7  ?Pain Location: lower abdominal pain  ? ? ?Home Living Family/patient expects to be  discharged to:: Skilled nursing facility ?Living Arrangements: Children ?Available Help at Discharge: Available 24 hours/day;Family (daughter works from home) ?Type of Home: House ?Home Access: Stairs to enter ?Entrance Stairs-Rails: None ?Entrance Stairs-Number of Steps: 2 ?  ?Home Layout: Two level;Able to live on main level with bedroom/bathroom ?Home Equipment: Cane - single Barista (2 wheels);Rollator (4 wheels);Shower seat ?   ?  ?Prior Function Prior Level of Function : Needs assist ?  ?  ?  ?  ?  ?  ?Mobility Comments: Pt reports she's only out of the home for MD appointments, does get help with bathing and general ADLs tasks ?  ?  ? ? ?Hand Dominance  ?   ? ?  ?Extremity/Trunk Assessment  ? Upper Extremity Assessment ?Upper Extremity Assessment: Generalized weakness ?  ? ?Lower Extremity Assessment ?Lower Extremity Assessment: Generalized weakness ?  ? ?   ?Communication  ? Communication: No difficulties  ?Cognition Arousal/Alertness: Awake/alert ?Behavior During Therapy: [facility] for tasks assessed/performed ?Overall Cognitive Status: Within Functional Limits for tasks assessed ?  ?  ?  ?  ?  ?  ?  ?  ?  ?  ?  ?  ?  ?  ?  ?  ?  ?  ?  ? ?  ?General Comments   ? ?  ?Exercises    ? ?Assessment/Plan  ?  ?PT Assessment Patient needs continued PT services  ?PT Problem List Decreased strength;Decreased mobility;Decreased range of motion;Decreased coordination;Decreased activity tolerance;Decreased cognition;Decreased balance;Decreased safety awareness ? ?   ?  ?PT Treatment Interventions DME instruction;Therapeutic exercise;Gait training;Balance training;Stair training;Neuromuscular re-education;Functional mobility training;Therapeutic activities;Patient/family education   ? ?PT Goals (Current goals can be found in the Care Plan section)  ?Acute Rehab PT Goals ?Patient Stated Goal: Get stronger at rehab before going home ?PT Goal Formulation: With patient ?Time For Goal Achievement: 02/12/22 ?Potential to  Achieve Goals: Good ? ?  ?Frequency Min 2X/week ?  ? ? ?Co-evaluation   ?  ?  ?  ?  ? ? ?  ?AM-PAC PT "6 Clicks" Mobility  ?Outcome Measure Help needed turning from your back to your side while in a flat bed without using bedrails?: A Little ?Help needed moving from lying on your back to sitting on the side of a flat bed without using bedrails?: A Little ?Help needed moving to and from a bed to a chair (including a wheelchair)?: A Little ?Help needed standing up from a chair using your arms (e.g., wheelchair or bedside chair)?: A Little ?Help needed to walk in hospital room?: A Lot ?Help needed climbing 3-5 steps with a railing? : A Lot ?6 Click Score: 16 ? ?  ?End of Session Equipment Utilized During Treatment: Gait belt ?Activity Tolerance: Patient limited by fatigue ?Patient left: in bed;with bed alarm set ?Nurse Communication: Mobility status ?PT Visit Diagnosis: Unsteadiness on feet (R26.81);Muscle weakness (generalized) (M62.81);History of falling (Z91.81) ?  ? ?Time: 5277-8242 ?PT Time Calculation (min) (ACUTE ONLY): 30 min ? ? ?Charges:   PT Evaluation ?$PT Eval Low Complexity: 1 Low ?PT Treatments ?$Gait Training: 8-22 mins ?  ?   ? ? ?  Kreg Shropshire, DPT ?01/29/2022, 9:21 AM ? ?

## 2022-01-29 NOTE — Hospital Course (Addendum)
Sabrina [name] is a 76 y.o. female with medical history significant for type 2 diabetes, hyperlipidemia, hypertension, recently diagnosed saddle PE status post thrombectomy 3 weeks ago, now on Eliquis, CVA, postmenopausal bleeding with concern for endometrial cancer who presented to [facility] ED at the recommendation of her primary care provider due to abnormal labs with elevated creatinine.  Patient reported generalized weakness since discharge 3 weeks ago, abdominal pain and distention. ? ?ED evaluation consistent with AKI, creatinine 3.07, BUN 32 with anion gap metabolic acidosis.  Vitals were stable and otherwise labs unremarkable. ?

## 2022-01-29 NOTE — Procedures (Signed)
PROCEDURE SUMMARY: ? ?Successful US guided paracentesis from right abdomen.  ?Yielded 2.4 L of amber-colored fluid.  ?No immediate complications.  ?Pt tolerated well.  ? ?Specimen sent for labs. ? ?EBL < 2 mL ? ?Theresa Duty, NP ?01/29/2022 ?2:22 PM ? ? ? ?

## 2022-01-29 NOTE — ED Notes (Signed)
Informed RN bed assigned 

## 2022-01-29 NOTE — Progress Notes (Signed)
Admission profile updated. ?

## 2022-01-29 NOTE — Progress Notes (Signed)
Pt admitted to ICU 17. A&OX4. Purposeful movement. Pupils equal, round, brisk. Per pt she is having 7/10 lower abdominal pain but refuses pain medication. To and from paracentesis. Report called to [employer]. [name] called and notified of pt transfer. ?

## 2022-01-29 NOTE — ED Notes (Signed)
Rounds performed and pt resting ?

## 2022-01-29 NOTE — Consult Note (Addendum)
? ?Hematology/Oncology Consult note ?[phone] [phone] ? ?  ? ? ?Patient Care Team: ?[name], MD as PCP - General (Internal Medicine)  ? ?Name of the patient: Sabrina [name]  ?[id number]  ?1945/12/06  ? ?Date of visit: 01/29/22 ?REASON FOR COSULTATION:  ?Postmenopausal bleeding concerning for endometrial cancer-requested by Dr. Arbutus Ped ? ?History of presenting illness-  ?76 y.o. female who was recently seen by gynecology oncology Dr. Fransisca Connors for postmenopausal bleeding, suspicious for endometrial cancer, ascites who was instructed to go to emergency room for evaluation of acute renal failure ? ?#Recent hospitalization 01/05/2022 - 01/11/2022, hospitalization due to acute saddle pulmonary embolism.  She was also noted to have small bilateral pleural effusion large amount of ascites in the upper abdomen.  Patient was anticoagulated.  Patient is status post thrombolysis and mechanical thrombectomy.  She was discharged on Eliquis.  Lower extremity ultrasound showed negative for DVT. ? ?#Hospitalization from 04/16/2021 - 04/19/2021 due to UTI, weakness. CT done on 04/16/2021, she had an abnormal appearance of the uterus with prominent fluid distention of the endometrial cavity, areas of surface nodularity.  Concerning for endometrial malignancy.  Pelvic floor laxity with mild cystocele ?Transvaginal ultrasound showed Dilated and thickened endometrial canal with fluid and hematocrit  ?level consistent with hemorrhage. Multiple areas of nodularity are noted. These are highly suspicious for endometrial carcinoma. Gynecologic referral is recommended for tissue sampling and diagnosis.  ?Patient was evaluated by gynecology Dr. Leafy Ro and an endometrial biopsy was recommended.  Patient declined and a biopsy was not done during the hospitalization. ?Apparently patient lost follow-up. ? ? ?Patient establish care with gynecology oncology Dr. Fransisca Connors on 01/29/2022 and has had endometrial biopsy.   Pathology is pending ?She was noted to have acute deterioration of her kidney function and was instructed to go to ER for additional work-up and management. ? ?01/29/2022, CT abdomen pelvis without contrast showed interval new findings of large volume abdominal in the pelvis free ascites, nodular thickening extending across the upper abdominal omentum worrisome for omental metastasis.  Ill-defined masslike heterogeneous appearance within the fluid in left upper pelvis.  Query early cirrhosis of the left lobe of the liver.  Left greater than right pleural effusion.  Patchy sclerosis of the right ischial tuberosity extending into the inferior pubic ramus.  No significant changes.  Aortic and coronary artery atherosclerosis. ? ?Oncology was consulted for further evaluation and management. ? ?Patient reports feeling cold.+ Abdominal distention has improved after paracentesis today./+ Groin pain. ? ? ?Review of Systems  ?Constitutional:  Positive for fatigue.  ?Eyes:  Negative for icterus.  ?Respiratory:  Negative for shortness of breath.   ?Cardiovascular:  Negative for leg swelling.  ?Gastrointestinal:  Positive for abdominal distention and abdominal pain. Negative for nausea and vomiting.  ?Skin:  Negative for rash.  ?Neurological:  Negative for light-headedness.  ?Hematological:  Bruises/bleeds easily.  ? ?Allergies  ?Allergen Reactions  ? Glimepiride Diarrhea and Other (See Comments)  ? Glipizide Other (See Comments)  ? Lisinopril Other (See Comments)  ?  headache  ? Losartan Other (See Comments)  ?  Headache  ? Norco [Hydrocodone-Acetaminophen] Other (See Comments)  ?  Mood changes  ? Pioglitazone Swelling  ?  On legs  ? Penicillins Diarrhea and Rash  ? ? ?Patient Active Problem List  ? Diagnosis Date Noted  ? Metabolic acidosis, increased anion gap 01/29/2022  ? UTI (urinary tract infection) 01/29/2022  ? Ascites 01/29/2022  ? Abnormal uterine bleeding (AUB) 01/29/2022  ? Endometrial cancer ([place])  01/29/2022  ?  Microcytic anemia 01/28/2022  ? Postmenopausal bleeding 01/27/2022  ? Hyponatremia 01/10/2022  ? GERD (gastroesophageal reflux disease) 01/08/2022  ? Thrombocytosis 01/08/2022  ? Syncope 01/05/2022  ? Stroke ([place]) 01/05/2022  ? Elevated troponin 01/05/2022  ? Nausea & vomiting 01/05/2022  ? Diabetes mellitus without complication ([place]) 18/56/3149  ? Acute saddle pulmonary embolism ([place]) 01/05/2022  ? 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 Traumatic rhabdomyolysis ([place])   ? Acute cystitis with hematuria   ? Abnormal finding present on diagnostic imaging of uterus   ? Hypotension   ? Generalized weakness   ? Sepsis ([place]) 04/16/2021  ? Malnutrition of moderate degree 12/10/2020  ? COVID-19 virus infection 12/09/2020  ? Acute kidney injury superimposed on CKD ([place]) 12/08/2020  ? History of stroke 03/02/2020  ? Leg weakness, bilateral 03/02/2020  ? Slurred speech 03/02/2020  ? Transient confusion 03/02/2020  ? Stroke, lacunar ([place]) 02/12/2020  ? Acute CVA (cerebrovascular accident) ([place]) 01/31/2020  ? Episode of transient neurologic symptoms 01/30/2020  ? Hyperglycemia due to type 2 diabetes mellitus ([place]) 01/30/2020  ? Hepatic steatosis 06/27/2019  ? History of behavioral and mental health problems 06/27/2019  ? Abnormal finding on CT scan 06/27/2019  ? Abdominal bloating 05/16/2019  ? Epigastric pain 05/16/2019  ? Personal history of malignant carcinoid tumor of rectum 05/16/2019  ? Mild nonproliferative diabetic retinopathy of left eye with macular edema associated with type 2 diabetes mellitus ([place]) 05/15/2019  ? Incomplete uterine prolapse 12/08/2017  ? Cystocele, midline 12/08/2017  ? LVH (left ventricular hypertrophy) due to hypertensive disease, without heart failure 06/01/2016  ? Bipolar affective disorder, manic, severe, with psychotic behavior ([place])   ? Type 2 diabetes mellitus with hyperlipidemia ([place]) 04/01/2016  ? Hyperlipidemia 04/01/2016  ? Brief psychotic disorder ([place]) 03/28/2016  ?  Noncompliance 03/28/2016  ? B12 deficiency 12/18/2014  ? ? ? ?Past Medical History:  ?Diagnosis Date  ? Diabetes mellitus without complication ([place])   ? Endometrial cancer ([place]) 01/29/2022  ? High cholesterol   ? Hypertension   ? ? ? ?Past Surgical History:  ?Procedure Laterality Date  ? DILATION AND CURETTAGE OF UTERUS    ? POLYPECTOMY    ? PULMONARY THROMBECTOMY Bilateral 01/08/2022  ? Procedure: PULMONARY THROMBECTOMY;  Surgeon: Algernon Huxley, MD;  Location: [facility];  Service: Cardiovascular;  Laterality: Bilateral;  ? ? ?Social History  ? ?Socioeconomic History  ? Marital status: Divorced  ?  Spouse name: Not on file  ? Number of children: Not on file  ? Years of education: Not on file  ? Highest education level: Not on file  ?Occupational History  ? Occupation: retired  ?Tobacco Use  ? Smoking status: Never  ? Smokeless tobacco: Never  ?Vaping Use  ? Vaping Use: Never used  ?Substance and Sexual Activity  ? Alcohol use: No  ? Drug use: No  ? Sexual activity: Not Currently  ?  Birth control/protection: Post-menopausal  ?Other Topics Concern  ? Not on file  ?Social History Narrative  ? Not on file  ? ?Social Determinants of Health  ? ?Financial Resource Strain: Not on file  ?Food Insecurity: Not on file  ?Transportation Needs: Not on file  ?Physical Activity: Not on file  ?Stress: Not on file  ?Social Connections: Not on file  ?Intimate Partner Violence: Not on file  ? ?  ?Family History  ?Problem Relation Age of Onset  ? Breast cancer Maternal Aunt   ?  2 aunts in their 36's  ? Breast cancer Maternal Aunt 38  ? Ovarian cancer Neg Hx   ? Colon cancer Neg Hx   ? ? ? ?Current Facility-Administered Medications:  ?  acetaminophen (TYLENOL) tablet 650 mg, 650 mg, Oral, Q6H PRN, Irene Pap N, DO ?  cefTRIAXone (ROCEPHIN) 1 g in sodium chloride 0.9 % 100 mL IVPB, 1 g, Intravenous, Q24H, Nicole Kindred A, DO, Stopped at 01/29/22 1218 ?  enoxaparin (LOVENOX) injection 30 mg, 30 mg, Subcutaneous, Q24H, Hall,  Carole N, DO, 30 mg at 01/29/22 1442 ?  insulin aspart (novoLOG) injection 0-6 Units, 0-6 Units, Subcutaneous, Q4H, Hall, Carole N, DO ?  prochlorperazine (COMPAZINE) injection 10 mg, 10 mg, Intravenous, Q6H PRN, Lamonte Sakai

## 2022-01-29 NOTE — Progress Notes (Signed)
PHARMACIST - PHYSICIAN COMMUNICATION ? ?CONCERNING:  Enoxaparin (Lovenox) for DVT Prophylaxis  ? ? ?RECOMMENDATION: ?Patient was prescribed enoxaprin '40mg'$  q24 hours for VTE prophylaxis.  ? ?Filed Weights  ? 01/28/22 2300  ?Weight: 64 kg (141 lb 1.5 oz)  ? ? ?Body mass index is 25.81 kg/m?. ? ?Estimated Creatinine Clearance: 13.9 mL/min (A) (by C-G formula based on SCr of 3.07 mg/dL (H)). ? ?Patient is candidate for enoxaparin '30mg'$  every 24 hours based on CrCl <53m/min or Weight <45kg ? ?DESCRIPTION: ?Pharmacy has adjusted [facility]aparin dose [facility]. ? ?Patient is now receiving enoxaparin 30 mg every 24 hours  ? ?NRenda Rolls PharmD, MBA ?01/29/2022 ?6:29 AM ? ? ?

## 2022-01-30 DIAGNOSIS — N179 Acute kidney failure, unspecified: Principal | ICD-10-CM | POA: Diagnosis present

## 2022-01-30 DIAGNOSIS — F03918 Unspecified dementia, unspecified severity, with other behavioral disturbance: Secondary | ICD-10-CM | POA: Diagnosis not present

## 2022-01-30 DIAGNOSIS — C541 Malignant neoplasm of endometrium: Secondary | ICD-10-CM

## 2022-01-30 DIAGNOSIS — R180 Malignant ascites: Secondary | ICD-10-CM

## 2022-01-30 DIAGNOSIS — K767 Hepatorenal syndrome: Secondary | ICD-10-CM

## 2022-01-30 DIAGNOSIS — Z7189 Other specified counseling: Secondary | ICD-10-CM

## 2022-01-30 LAB — CBC
HCT: 29.7 % — ABNORMAL LOW (ref 36.0–46.0)
Hemoglobin: 9.3 g/dL — ABNORMAL LOW (ref 12.0–15.0)
MCH: 27 pg (ref 26.0–34.0)
MCHC: 31.3 g/dL (ref 30.0–36.0)
MCV: 86.3 fL (ref 80.0–100.0)
Platelets: 580 10*3/uL — ABNORMAL HIGH (ref 150–400)
RBC: 3.44 MIL/uL — ABNORMAL LOW (ref 3.87–5.11)
RDW: 14 % (ref 11.5–15.5)
WBC: 5.6 10*3/uL (ref 4.0–10.5)
nRBC: 0 % (ref 0.0–0.2)

## 2022-01-30 LAB — GLUCOSE, CAPILLARY
Glucose-Capillary: 117 mg/dL — ABNORMAL HIGH (ref 70–99)
Glucose-Capillary: 121 mg/dL — ABNORMAL HIGH (ref 70–99)
Glucose-Capillary: 135 mg/dL — ABNORMAL HIGH (ref 70–99)
Glucose-Capillary: 143 mg/dL — ABNORMAL HIGH (ref 70–99)
Glucose-Capillary: 149 mg/dL — ABNORMAL HIGH (ref 70–99)
Glucose-Capillary: 167 mg/dL — ABNORMAL HIGH (ref 70–99)

## 2022-01-30 LAB — RENAL FUNCTION PANEL
Albumin: 3 g/dL — ABNORMAL LOW (ref 3.5–5.0)
Anion gap: 14 (ref 5–15)
BUN: 27 mg/dL — ABNORMAL HIGH (ref 8–23)
CO2: 22 mmol/L (ref 22–32)
Calcium: 8.9 mg/dL (ref 8.9–10.3)
Chloride: 101 mmol/L (ref 98–111)
Creatinine, Ser: 2.74 mg/dL — ABNORMAL HIGH (ref 0.44–1.00)
GFR, Estimated: 18 mL/min — ABNORMAL LOW (ref >60)
Glucose, Bld: 141 mg/dL — ABNORMAL HIGH (ref 70–99)
Phosphorus: 3 mg/dL (ref 2.5–4.6)
Potassium: 3.4 mmol/L — ABNORMAL LOW (ref 3.5–5.1)
Sodium: 137 mmol/L (ref 135–145)

## 2022-01-30 LAB — MAGNESIUM: Magnesium: 1.9 mg/dL (ref 1.7–2.4)

## 2022-01-30 MED ORDER — PANTOPRAZOLE SODIUM 40 MG PO TBEC
40.0000 mg | DELAYED_RELEASE_TABLET | Freq: Every day | ORAL | Status: DC
  Administered 2022-01-30 – 2022-02-03 (×5): 40 mg via ORAL
  Filled 2022-01-30 (×5): qty 1

## 2022-01-30 MED ORDER — POTASSIUM CHLORIDE CRYS ER 20 MEQ PO TBCR
40.0000 meq | EXTENDED_RELEASE_TABLET | Freq: Once | ORAL | Status: AC
Start: 2022-01-30 — End: 2022-01-30
  Administered 2022-01-30: 40 meq via ORAL
  Filled 2022-01-30: qty 2

## 2022-01-30 MED ORDER — SENNOSIDES-DOCUSATE SODIUM 8.6-50 MG PO TABS
1.0000 | ORAL_TABLET | Freq: Two times a day (BID) | ORAL | Status: DC
  Administered 2022-01-30 – 2022-02-03 (×9): 1 via ORAL
  Filled 2022-01-30 (×9): qty 1

## 2022-01-30 MED ORDER — POLYETHYLENE GLYCOL 3350 17 G PO PACK
17.0000 g | PACK | Freq: Every day | ORAL | Status: DC
  Administered 2022-01-30 – 2022-02-03 (×4): 17 g via ORAL
  Filled 2022-01-30 (×4): qty 1

## 2022-01-30 NOTE — TOC Progression Note (Signed)
Transition of Care (TOC) - Progression Note  ? ? ?Patient Details  ?Name: Sabrina [name] ?MRN: [id number] ?Date of Birth: 04/27/1946 ? ?Transition of Care (TOC) CM/SW Contact  ?Conception Oms, RN ?Phone Number: ?01/30/2022, 10:37 AM ? ?Clinical Narrative:   Patient if from home, she is agreeable to a bed search for STR, PASSR obtained, Bedsearch sent Fl2 completed, will review bed offers once obtained ? ? ? ?Expected Discharge Plan: [place] ?Barriers to Discharge: Continued Medical Work up, SNF Pending bed offer, Insurance Authorization ? ?Expected Discharge Plan and Services ?Expected Discharge Plan: [place] ?  ?  ?  ?  ?                ?  ?  ?  ?  ?  ?  ?  ?  ?  ?  ? ? ?Social Determinants of Health (SDOH) Interventions ?  ? ?Readmission Risk Interventions ?   ? View : No data to display.  ?  ?  ?  ? ? ?

## 2022-01-30 NOTE — Assessment & Plan Note (Signed)
On PPI

## 2022-01-30 NOTE — Assessment & Plan Note (Addendum)
PT/OT recommend HOME HEALTH. ?TOC following.   ?Fall precautions. ?Up out of bed at least daily. ?Mobilize frequently as tolerated. ? ?Patient got up from bed and ambulated to and from restroom independently without any assistance this morning during my encounter.   ?

## 2022-01-30 NOTE — Progress Notes (Signed)
Pt stated pain level is an 8. Offered pain medication and pt refused saying "when I want some, I will let you know". Day shift RN for pt Informed. ?

## 2022-01-30 NOTE — Assessment & Plan Note (Addendum)
Secondary to metastatic endometrial cancer. Status post 2.4 L fluid removed by paracentesis on 4/6.  Appears slowly re-accumulating but not rapidly. ?Monitor closely.  Paracentesis as needed, if pt agreeable. ?

## 2022-01-30 NOTE — Assessment & Plan Note (Addendum)
Biopsy had just been obtained outpatient, pathology pending.  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 ?-- Goals of care discussions ongoing ?

## 2022-01-30 NOTE — Assessment & Plan Note (Signed)
POA due to renal failure. ?Improved. ?Monitor BMP. ?

## 2022-01-30 NOTE — Assessment & Plan Note (Signed)
Uncontrolled.  A1c 8.8% on 01/05/2022. ?Sensitive insulin sliding scale Novolog. ?

## 2022-01-30 NOTE — NC FL2 (Signed)
? MEDICAID FL2 LEVEL OF CARE SCREENING TOOL  ?  ? ?IDENTIFICATION  ?Patient Name: ?Sabrina [name] Birthdate: 08/22/1946 Sex: female Admission Date (Current Location): ?01/28/2022  ?[place] and [place] Number: ? [place] ?  Facility and Address:  ?[facility], [address], [place], [place] [address] ?     Provider Number: ?[id number]  ?Attending Physician Name and Address:  ?Ezekiel Slocumb, DO ? Relative Name and Phone Number:  ?[name] Daughter [phone] ?   ?Current Level of Care: ?Hospital Recommended Level of Care: ?[place] Prior Approval Number: ?  ? ?Date Approved/Denied: ?  PASRR Number: ?[id number] ? ?Discharge Plan: ?SNF ?  ? ?Current Diagnoses: ?Patient Active Problem List  ? Diagnosis Date Noted  ? AKI (acute kidney injury) ([place]) 01/30/2022  ? Metabolic acidosis, increased anion gap 01/29/2022  ? UTI (urinary tract infection) 01/29/2022  ? Ascites 01/29/2022  ? Abnormal uterine bleeding (AUB) 01/29/2022  ? Endometrial cancer ([place]) 01/29/2022  ? Microcytic anemia 01/28/2022  ? Postmenopausal bleeding 01/27/2022  ? Hyponatremia 01/10/2022  ? GERD (gastroesophageal reflux disease) 01/08/2022  ? Thrombocytosis 01/08/2022  ? Syncope 01/05/2022  ? Stroke ([place]) 01/05/2022  ? Elevated troponin 01/05/2022  ? Nausea & vomiting 01/05/2022  ? Diabetes mellitus without complication ([place]) 08/22/2535  ? Acute saddle pulmonary embolism ([place]) 01/05/2022  ? 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 Traumatic rhabdomyolysis ([place])   ? Acute cystitis with hematuria   ? Abnormal finding present on diagnostic imaging of uterus   ? Hypotension   ? Generalized weakness   ? Sepsis ([place]) 04/16/2021  ? Malnutrition of moderate degree 12/10/2020  ? COVID-19 virus infection 12/09/2020  ? Hepatorenal syndrome ([place]) 12/08/2020  ? History of stroke 03/02/2020  ? Leg weakness, bilateral 03/02/2020  ? Slurred speech 03/02/2020  ? Transient confusion 03/02/2020  ? Stroke,  lacunar ([place]) 02/12/2020  ? Acute CVA (cerebrovascular accident) ([place]) 01/31/2020  ? Episode of transient neurologic symptoms 01/30/2020  ? Hyperglycemia due to type 2 diabetes mellitus ([place]) 01/30/2020  ? Hepatic steatosis 06/27/2019  ? History of behavioral and mental health problems 06/27/2019  ? Abnormal finding on CT scan 06/27/2019  ? Abdominal bloating 05/16/2019  ? Epigastric pain 05/16/2019  ? Personal history of malignant carcinoid tumor of rectum 05/16/2019  ? Mild nonproliferative diabetic retinopathy of left eye with macular edema associated with type 2 diabetes mellitus ([place]) 05/15/2019  ? Incomplete uterine prolapse 12/08/2017  ? Cystocele, midline 12/08/2017  ? LVH (left ventricular hypertrophy) due to hypertensive disease, without heart failure 06/01/2016  ? Bipolar affective disorder, manic, severe, with psychotic behavior ([place])   ? Type 2 diabetes mellitus with hyperlipidemia ([place]) 04/01/2016  ? Hyperlipidemia 04/01/2016  ? Brief psychotic disorder ([place]) 03/28/2016  ? Noncompliance 03/28/2016  ? B12 deficiency 12/18/2014  ? ? ?Orientation RESPIRATION BLADDER Height & Weight   ?  ?Self, Time, Situation, Place ? Normal, O2 (2 liters) Continent Weight: 58.7 kg ?Height:  '5\' 2"'$  (157.5 cm)  ?BEHAVIORAL SYMPTOMS/MOOD NEUROLOGICAL BOWEL NUTRITION STATUS  ?    Continent Diet (see dc summary)  ?AMBULATORY STATUS COMMUNICATION OF NEEDS Skin   ?Extensive Assist Verbally Normal ?  ?  ?  ?    ?     ?     ? ? ?Personal Care Assistance Level of Assistance  ?Bathing, Feeding, Dressing Bathing Assistance: Limited assistance ?Feeding assistance: Limited assistance ?Dressing Assistance: Maximum assistance ?   ? ?Functional Limitations Info  ?    ?  ?   ? ? ?  SPECIAL CARE FACTORS FREQUENCY  ?PT (By licensed PT)   ?  ?PT Frequency: 5 times per week ?  ?  ?  ?  ?   ? ? ?Contractures Contractures Info: Not present  ? ? ?Additional Factors Info  ?Code Status, Allergies Code Status Info: full code ?Allergies Info:  Glimepiride, Glipizide, Lisinopril, Losartan, Norco (Hydrocodone-acetaminophen), Pioglitazone, Penicillins ?  ?  ?  ?   ? ?Current Medications (01/30/2022):  This is the current hospital active medication list ?Current Facility-Administered Medications  ?Medication Dose Route Frequency Provider Last Rate Last Admin  ? acetaminophen (TYLENOL) tablet 650 mg  650 mg Oral Q6H PRN Irene Pap N, DO      ? cefTRIAXone (ROCEPHIN) 1 g in sodium chloride 0.9 % 100 mL IVPB  1 g Intravenous Q24H Nicole Kindred A, DO 200 mL/hr at 01/30/22 0903 1 g at 01/30/22 0903  ? enoxaparin (LOVENOX) injection 30 mg  30 mg Subcutaneous Q24H Hall, Carole N, DO   30 mg at 01/30/22 0845  ? insulin aspart (novoLOG) injection 0-6 Units  0-6 Units Subcutaneous Q4H Hall, Carole N, DO      ? polyethylene glycol (MIRALAX / GLYCOLAX) packet 17 g  17 g Oral Daily Nicole Kindred A, DO      ? prochlorperazine (COMPAZINE) injection 10 mg  10 mg Intravenous Q6H PRN Irene Pap N, DO      ? senna-docusate (Senokot-S) tablet 1 tablet  1 tablet Oral BID Nicole Kindred A, DO      ? ? ? ?Discharge Medications: ?Please see discharge summary for a list of discharge medications. ? ?Relevant Imaging Results: ? ?Relevant Lab Results: ? ? ?Additional Information ?[id number] ? ?Conception Oms, RN ? ? ? ? ?

## 2022-01-30 NOTE — Consult Note (Signed)
? ?                                                                                ?Consultation Note ?Date: 01/30/2022  ? ?Patient Name: Sabrina [name]  ?DOB: Oct 16, 1946  MRN: [id number]  Age / Sex: 76 y.o., female  ?PCP: [name], MD ?Referring Physician: Ezekiel Slocumb, DO ? ?Reason for Consultation: Establishing goals of care ? ?HPI/Patient Profile: 76 y.o. female who was recently seen by gynecology oncology Dr. Fransisca Connors for postmenopausal bleeding, suspicious for endometrial cancer, ascites who was instructed to go to emergency room for evaluation of acute renal failure. ? ?Clinical Assessment and Goals of Care: ?Notes and labs reviewed. Patient is sitting in bed with covers pulled over her head. Her sister is at bedside. Asked patient how she is doing and she said "awful". She pulled the covers down. Introduced myself and role. She proceeds with pressured speech to tell me nothing is wrong with her and that 5 people have came in to tell her she may have cancer "like that's supposed to scare me...ew I have cancer." Continuing without interruption she began naming individuals and asked me to thank someone over volunteers for a hospice picture. She then jumps to discussing Haldol, Risperdal, Ativan, and Zoloft and a doctor who tried to put her on these medications in the past. She states "ya'll want to say I'm crazy". She held up her  arm moving the skin and stated "I've lost 14 pounds since I've been here." She then tells me to talk to the other 5 that have been in here regarding her wishes on care moving forward and then asked me to leave.   ? ?Sister followed me out. Sister states "sometimes she gets like this". Sometimes she is rational and other times she is not. She states the names she was stating and some of the things she was saying was accurate. Sister shares that mental health diagnoses are hereditary and significant within their family.  ?  ? ?SUMMARY OF RECOMMENDATIONS   ?Unable to have a  conversation with patient, see note above.  ? ?Could recommend outpatient palliative if patient is amenable.  ? ? ? ?  ? ?Primary Diagnoses: ?Present on Admission: ? (Resolved) AKI (acute kidney injury) ([place]) ? Metabolic acidosis, increased anion gap ? Thrombocytosis ? Type 2 diabetes mellitus with hyperlipidemia ([place]) ? Bipolar affective disorder, manic, severe, with psychotic behavior ([place]) ? Dementia with aggressive behavior ([place]) ? GERD (gastroesophageal reflux disease) ? Hyperlipidemia ? UTI (urinary tract infection) ? Ascites ? Abnormal uterine bleeding (AUB) ? Endometrial cancer ([place]) ? AKI (acute kidney injury) ([place]) ? ? ?I have reviewed the medical record, interviewed the patient and family, and examined the patient. The following aspects are pertinent. ? ?Past Medical History:  ?Diagnosis Date  ? Diabetes mellitus without complication ([place])   ? Endometrial cancer ([place]) 01/29/2022  ? High cholesterol   ? Hypertension   ? ?Social History  ? ?Socioeconomic History  ? Marital status: Divorced  ?  Spouse name: Not on file  ? Number of children: Not on file  ? Years of education: Not on file  ? Highest education level:  Not on file  ?Occupational History  ? Occupation: retired  ?Tobacco Use  ? Smoking status: Never  ? Smokeless tobacco: Never  ?Vaping Use  ? Vaping Use: Never used  ?Substance and Sexual Activity  ? Alcohol use: No  ? Drug use: No  ? Sexual activity: Not Currently  ?  Birth control/protection: Post-menopausal  ?Other Topics Concern  ? Not on file  ?Social History Narrative  ? Not on file  ? ?Social Determinants of Health  ? ?Financial Resource Strain: Not on file  ?Food Insecurity: Not on file  ?Transportation Needs: Not on file  ?Physical Activity: Not on file  ?Stress: Not on file  ?Social Connections: Not on file  ? ?Family History  ?Problem Relation Age of Onset  ? Breast cancer Maternal Aunt   ?     2 aunts in their 48's  ? Breast cancer Maternal Aunt 40  ? Ovarian cancer Neg Hx   ? Colon  cancer Neg Hx   ? ?Scheduled Meds: ? enoxaparin (LOVENOX) injection  30 mg Subcutaneous Q24H  ? insulin aspart  0-6 Units Subcutaneous Q4H  ? pantoprazole  40 mg Oral Daily  ? polyethylene glycol  17 g Oral Daily  ? senna-docusate  1 tablet Oral BID  ? ?Continuous Infusions: ? cefTRIAXone (ROCEPHIN)  IV 1 g (01/30/22 0903)  ? ?PRN Meds:.acetaminophen, prochlorperazine ?Medications Prior to Admission:  ?Prior to Admission medications   ?Medication Sig Start Date End Date Taking? Authorizing Provider  ?Accu-Chek Softclix Lancets lancets  01/23/22   [provider]  ?Accu-Chek Softclix Lancets lancets Use to check blood sugar twice a day as directed. 01/23/22   [provider]  ?apixaban (ELIQUIS) 5 MG TABS tablet Two tablets twice a day for six days, then One tablet twice a day afterwards ?Patient not taking: Reported on 01/28/2022 01/11/22   Loletha Grayer, MD  ?furosemide (LASIX) 20 MG tablet Take by mouth. ?Patient not taking: Reported on 01/28/2022 01/20/22 01/20/23  [provider]  ?insulin detemir (LEVEMIR) 100 UNIT/ML injection Inject 100 Units into the skin daily.    [provider]  ?metFORMIN (GLUCOPHAGE) 500 MG tablet Take 1,000 mg by mouth 2 (two) times daily. 09/10/21   [provider]  ?ondansetron (ZOFRAN-ODT) 4 MG disintegrating tablet Take by mouth. 01/04/22   [provider]  ?pantoprazole (PROTONIX) 40 MG tablet Take 1 tablet (40 mg total) by mouth daily. 01/11/22   Loletha Grayer, MD  ?polyethylene glycol (MIRALAX / GLYCOLAX) 17 g packet Take 17 g by mouth daily as needed for moderate constipation. 01/11/22   Loletha Grayer, MD  ?spironolactone (ALDACTONE) 25 MG tablet Take 25 mg by mouth daily.    [provider]  ? ?Allergies  ?Allergen Reactions  ? Glimepiride Diarrhea and Other (See Comments)  ? Glipizide Other (See Comments)  ? Lisinopril Other (See Comments)  ?  headache  ? Losartan Other (See Comments)  ?  Headache  ? Norco  [Hydrocodone-Acetaminophen] Other (See Comments)  ?  Mood changes  ? Pioglitazone Swelling  ?  On legs  ? Penicillins Diarrhea and Rash  ? ?Review of Systems  ?Constitutional:   ?     Says she feels "awful".   ? ?Physical Exam ?Pulmonary:  ?   Effort: Pulmonary effort is normal.  ?Neurological:  ?   Mental Status: She is alert.  ? ? ?Vital Signs: BP 122/64 (BP Location: Left Arm)   Pulse 91   Temp 98.4 ?F (36.9 ?C)  Resp 16   Ht '5\' 2"'$  (1.575 m)   Wt 58.7 kg   SpO2 95%   BMI 23.67 kg/m?  ?Pain Scale: 0-10 ?POSS *See Group Information*: 1-Acceptable,Awake and alert ?Pain Score: 8  ? ? ?SpO2: SpO2: 95 % ?O2 Device:SpO2: 95 % ?O2 Flow Rate: .  ? ?IO: Intake/output summary:  ?Intake/Output Summary (Last 24 hours) at 01/30/2022 1341 ?Last data filed at 01/30/2022 0252 ?Gross per 24 hour  ?Intake 250 ml  ?Output 200 ml  ?Net 50 ml  ? ? ?LBM: Last BM Date :  (PTA) ?Baseline Weight: Weight: 64 kg ?Most recent weight: Weight: 58.7 kg     ? ?Signed by: ?Asencion Gowda, NP ?  ?Please contact Palliative Medicine Team phone at [phone] for questions and concerns.  ?For individual provider: See Amion ? ? ? ? ? ? ? ? ? ? ? ? ? ?

## 2022-01-30 NOTE — Assessment & Plan Note (Addendum)
Not on any psych meds per med history. ?Psychiatry consulted to determine capacity for decision making at the request of Oncology. ?--Pt does have capacity ?--No acute issues or suicidality ? ?Patient demonstrates very labile mood.  At times very pleasant and cooperative, other times refusing to talk or even allow staff to enter the room.  Speech content often nonsensical, difficult to redirect. ?--Recommend psychiatry referral ?--Consider mood stabilizing medication ?

## 2022-01-30 NOTE — Progress Notes (Addendum)
? ?Hematology/Oncology Progress note ?[phone] [phone] ?  ? ? ?Patient Care Team: ?[name], MD as PCP - General (Internal Medicine)  ? ?Name of the patient: Sabrina [name]  ?[id number]  ?09-17-1946  ?Date of visit: 01/30/22 ? ? ?INTERVAL HISTORY-  ?No acute overnight events. ?Patient appears to be comfortable ? ? ?Allergies  ?Allergen Reactions  ? Glimepiride Diarrhea and Other (See Comments)  ? Glipizide Other (See Comments)  ? Lisinopril Other (See Comments)  ?  headache  ? Losartan Other (See Comments)  ?  Headache  ? Norco [Hydrocodone-Acetaminophen] Other (See Comments)  ?  Mood changes  ? Pioglitazone Swelling  ?  On legs  ? Penicillins Diarrhea and Rash  ? ? ?Patient Active Problem List  ? Diagnosis Date Noted  ? AKI (acute kidney injury) ([place]) 01/30/2022  ? Metabolic acidosis, increased anion gap 01/29/2022  ? UTI (urinary tract infection) 01/29/2022  ? Ascites 01/29/2022  ? Abnormal uterine bleeding (AUB) 01/29/2022  ? Endometrial cancer ([place]) 01/29/2022  ? Microcytic anemia 01/28/2022  ? Postmenopausal bleeding 01/27/2022  ? Hyponatremia 01/10/2022  ? GERD (gastroesophageal reflux disease) 01/08/2022  ? Thrombocytosis 01/08/2022  ? Syncope 01/05/2022  ? Stroke ([place]) 01/05/2022  ? Elevated troponin 01/05/2022  ? Nausea & vomiting 01/05/2022  ? Diabetes mellitus without complication ([place]) 65/12/5463  ? Acute saddle pulmonary embolism ([place]) 01/05/2022  ? 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 Traumatic rhabdomyolysis ([place])   ? Acute cystitis with hematuria   ? Abnormal finding present on diagnostic imaging of uterus   ? Hypotension   ? Generalized weakness   ? Sepsis ([place]) 04/16/2021  ? Malnutrition of moderate degree 12/10/2020  ? COVID-19 virus infection 12/09/2020  ? Hepatorenal syndrome ([place]) 12/08/2020  ? History of stroke 03/02/2020  ? Leg weakness, bilateral 03/02/2020  ? Slurred speech 03/02/2020  ? Transient confusion 03/02/2020  ? Stroke, lacunar  ([place]) 02/12/2020  ? Acute CVA (cerebrovascular accident) ([place]) 01/31/2020  ? Episode of transient neurologic symptoms 01/30/2020  ? Hyperglycemia due to type 2 diabetes mellitus ([place]) 01/30/2020  ? Hepatic steatosis 06/27/2019  ? History of behavioral and mental health problems 06/27/2019  ? Abnormal finding on CT scan 06/27/2019  ? Abdominal bloating 05/16/2019  ? Epigastric pain 05/16/2019  ? Personal history of malignant carcinoid tumor of rectum 05/16/2019  ? Mild nonproliferative diabetic retinopathy of left eye with macular edema associated with type 2 diabetes mellitus ([place]) 05/15/2019  ? Incomplete uterine prolapse 12/08/2017  ? Cystocele, midline 12/08/2017  ? LVH (left ventricular hypertrophy) due to hypertensive disease, without heart failure 06/01/2016  ? Bipolar affective disorder, manic, severe, with psychotic behavior ([place])   ? Type 2 diabetes mellitus with hyperlipidemia ([place]) 04/01/2016  ? Hyperlipidemia 04/01/2016  ? Brief psychotic disorder ([place]) 03/28/2016  ? Noncompliance 03/28/2016  ? B12 deficiency 12/18/2014  ? ? ? ?Past Medical History:  ?Diagnosis Date  ? Diabetes mellitus without complication ([place])   ? Endometrial cancer ([place]) 01/29/2022  ? High cholesterol   ? Hypertension   ? ? ? ?Past Surgical History:  ?Procedure Laterality Date  ? DILATION AND CURETTAGE OF UTERUS    ? POLYPECTOMY    ? PULMONARY THROMBECTOMY Bilateral 01/08/2022  ? Procedure: PULMONARY THROMBECTOMY;  Surgeon: Algernon Huxley, MD;  Location: [facility];  Service: Cardiovascular;  Laterality: Bilateral;  ? ? ?Social History  ? ?Socioeconomic History  ? Marital status: Divorced  ?  Spouse name: Not on file  ? Number  of children: Not on file  ? Years of education: Not on file  ? Highest education level: Not on file  ?Occupational History  ? Occupation: retired  ?Tobacco Use  ? Smoking status: Never  ? Smokeless tobacco: Never  ?Vaping Use  ? Vaping Use: Never used  ?Substance and Sexual Activity  ? Alcohol use: No  ?  Drug use: No  ? Sexual activity: Not Currently  ?  Birth control/protection: Post-menopausal  ?Other Topics Concern  ? Not on file  ?Social History Narrative  ? Not on file  ? ?Social Determinants of Health  ? ?Financial Resource Strain: Not on file  ?Food Insecurity: Not on file  ?Transportation Needs: Not on file  ?Physical Activity: Not on file  ?Stress: Not on file  ?Social Connections: Not on file  ?Intimate Partner Violence: Not on file  ? ?  ?Family History  ?Problem Relation Age of Onset  ? Breast cancer Maternal Aunt   ?     2 aunts in their 83's  ? Breast cancer Maternal Aunt 38  ? Ovarian cancer Neg Hx   ? Colon cancer Neg Hx   ? ? ? ?Current Facility-Administered Medications:  ?  acetaminophen (TYLENOL) tablet 650 mg, 650 mg, Oral, Q6H PRN, Irene Pap N, DO ?  cefTRIAXone (ROCEPHIN) 1 g in sodium chloride 0.9 % 100 mL IVPB, 1 g, Intravenous, Q24H, Nicole Kindred A, DO, Last Rate: 200 mL/hr at 01/30/22 0903, 1 g at 01/30/22 0903 ?  enoxaparin (LOVENOX) injection 30 mg, 30 mg, Subcutaneous, Q24H, Hall, Carole N, DO, 30 mg at 01/30/22 0845 ?  insulin aspart (novoLOG) injection 0-6 Units, 0-6 Units, Subcutaneous, Q4H, Kayleen Memos, DO, 1 Units at 01/30/22 1225 ?  pantoprazole (PROTONIX) EC tablet 40 mg, 40 mg, Oral, Daily, Nicole Kindred A, DO, 40 mg at 01/30/22 1225 ?  polyethylene glycol (MIRALAX / GLYCOLAX) packet 17 g, 17 g, Oral, Daily, Nicole Kindred A, DO, 17 g at 01/30/22 1225 ?  prochlorperazine (COMPAZINE) injection 10 mg, 10 mg, Intravenous, Q6H PRN, Irene Pap N, DO ?  senna-docusate (Senokot-S) tablet 1 tablet, 1 tablet, Oral, BID, Ezekiel Slocumb, DO, 1 tablet at 01/30/22 1225 ? ? ?Physical exam:  ?Vitals:  ? 01/29/22 2315 01/30/22 0032 01/30/22 0500 01/30/22 0510  ?BP: 129/78 127/66  122/64  ?Pulse: 83 85  91  ?Resp: '16 16  16  '$ ?Temp: 97.9 ?F (36.6 ?C) 97.7 ?F (36.5 ?C)  98.4 ?F (36.9 ?C)  ?TempSrc:      ?SpO2: 99% 100%  95%  ?Weight:   129 lb 6.6 oz (58.7 kg)   ?Height:       ? ?Physical Exam  ?Patient declined physical examination ? ? ? ?  Latest Ref Rng & Units 01/30/2022  ?  4:16 AM  ?CMP  ?Glucose 70 - 99 mg/dL 141    ?BUN 8 - 23 mg/dL 27    ?Creatinine 0.44 - 1.00 mg/dL 2.74    ?Sodium 135 - 145 mmol/L 137    ?Potassium 3.5 - 5.1 mmol/L 3.4    ?Chloride 98 - 111 mmol/L 101    ?CO2 22 - 32 mmol/L 22    ?Calcium 8.9 - 10.3 mg/dL 8.9    ? ? ?  Latest Ref Rng & Units 01/30/2022  ?  4:16 AM  ?CBC  ?WBC 4.0 - 10.5 K/uL 5.6    ?Hemoglobin 12.0 - 15.0 g/dL 9.3    ?Hematocrit 36.0 - 46.0 % 29.7    ?  Platelets 150 - 400 K/uL 580    ? ? ?RADIOGRAPHIC STUDIES: ?I have personally reviewed the radiological images as listed and agreed with the findings in the report. ?CT ABDOMEN PELVIS WO CONTRAST ? ?Result Date: 01/29/2022 ?CLINICAL DATA:  Weakness and abdominal swelling. Increasing constipation. Last CT was abnormal concerning for uterine neoplasm, unknown what follow-up may have been done following the CT reading. There is note made of a dilatation and curettage of the uterus in the patient's surgical history but the date is unknown. Most recently the patient underwent mechanical pulmonary arterial thrombectomy on 01/08/2022. EXAM: CT ABDOMEN AND PELVIS WITHOUT CONTRAST TECHNIQUE: Multidetector CT imaging of the abdomen and pelvis was performed following the standard protocol without IV contrast. RADIATION DOSE REDUCTION: This exam was performed according to the departmental dose-optimization program which includes automated exposure control, adjustment of the mA and/or kV according to patient size and/or use of iterative reconstruction technique. COMPARISON:  CT without contrast 04/16/2021, CT with IV and oral contrast 06/16/2019 FINDINGS: Lower chest: There is a small to moderate-sized left and small size layering right pleural effusions with adjacent consolidation or atelectasis in the left-greater-than-right lower lobes. The cardiac size is normal. Coronary arteries are heavily calcified with  calcifications in the aortic valve plane. Hepatobiliary: 14 cm in length liver with normal noncontrast attenuation. The gallbladder and bile ducts are unremarkable. Some images suggest slight capsular nodularity ant

## 2022-01-30 NOTE — Assessment & Plan Note (Addendum)
Cr on admission 2.70>>3.07>>2.74 (x2 d) >> 2.45 >> 2.14 >> 1.87 today. ?Baseline appears ~ 1.0 ?Renal U/S normal. ?Monitor urine output. ?Hypervolemic, will avoid fluids, encourage PO hydration. ?Avoid nephrotoxins, hypotension. ?Renally dose meds. ?Nephrology consulted - follow up. ?

## 2022-01-30 NOTE — Progress Notes (Signed)
Patient stated level 8 pain score. Offered pains medications, patient refused and said, "I don't want pain medicine, if I want some, I'll ask you. If I take 2 tylenols, I sleep for 46 hours and will wake up with the pain still in there. I don't like to take opiods."  ?

## 2022-01-30 NOTE — Evaluation (Signed)
Occupational Therapy Evaluation ?Patient Details ?Name: Sabrina [name] ?MRN: [id number] ?DOB: 04/12/1946 ?Today's Date: 01/30/2022 ? ? ?History of Present Illness 76 y.o. female who was here ~4 week prior after passing out/Loss of Consciousness, had thrombectomy bilateral pulmonary emobli (3/16).  She is now sent by PCP with abnormal lab values, abdominal pain, acute kidney injury, and generalized weakness. PmHx: concern for endometrial cancer/mets, DM, HTN, HLD, CVA (4/21).  ? ?Clinical Impression ?  ?Pt seen for OT evaluation this date. Pt endorsing 8/10 abd pain at rest but has been declining pain medication from nursing. No family present to verify info. Pt intermittently expressing frustration with therapist but unable to share what was bothering her and short in responses. Pt completed bed mobility with supervision and increased time/effort. Once seated EOB pt states "don't touch me" as OT comes near prior to transfer for safety. Pt able to stand and transfer to the recliner with the RW with SBA-CGA. Pt set up with breakfast tray after expressing strong interest to eat and declines further OT. Pt demo's decreased safety awareness and awareness of deficits. Pt will benefit from additional skilled OT Services to maximize return to PLOF.    ? ?Recommendations for follow up therapy are one component of a multi-disciplinary discharge planning process, led by the attending physician.  Recommendations may be updated based on patient status, additional functional criteria and insurance authorization.  ? ?Follow Up Recommendations ? Skilled nursing-short term rehab (<3 hours/day)  ?  ?Assistance Recommended at Discharge Intermittent Supervision/Assistance  ?Patient can return home with the following A little help with walking and/or transfers;A little help with bathing/dressing/bathroom;Assistance with cooking/housework;Assist for transportation;Help with stairs or ramp for entrance;Direct supervision/assist for  medications management ? ?  ?Functional Status Assessment ? Patient has had a recent decline in their functional status and demonstrates the ability to make significant improvements in function in a reasonable and predictable amount of time.  ?Equipment Recommendations ? BSC/3in1  ?  ?Recommendations for Other Services   ? ? ?  ?Precautions / Restrictions Precautions ?Precautions: Fall ?Restrictions ?Weight Bearing Restrictions: No  ? ?  ? ?Mobility Bed Mobility ?Overal bed mobility: Needs Assistance ?Bed Mobility: Supine to Sit ?  ?  ?Supine to sit: Supervision ?  ?  ?General bed mobility comments: increased time/effort ?  ? ?Transfers ?Overall transfer level: Needs assistance ?Equipment used: Rolling walker (2 wheels) ?Transfers: Sit to/from Stand ?Sit to Stand: Min guard ?  ?  ?  ?  ?  ?General transfer comment: Pt states "don't touch me" as OT gets close ?  ? ?  ?Balance Overall balance assessment: Needs assistance ?Sitting-balance support: Feet supported, No upper extremity supported ?Sitting balance-Leahy Scale: Good ?  ?  ?Standing balance support: Bilateral upper extremity supported, During functional activity, Reliant on assistive device for balance ?Standing balance-Leahy Scale: Fair ?  ?  ?  ?  ?  ?  ?  ?  ?  ?  ?  ?  ?   ? ?ADL either performed or assessed with clinical judgement  ? ?ADL Overall ADL's : Needs assistance/impaired ?  ?  ?  ?  ?  ?  ?  ?  ?  ?  ?  ?  ?  ?  ?  ?  ?  ?  ?  ?General ADL Comments: Pt currently requires MIN A for LB ADL tasks, SBA-CGA for ADL transfers  ? ? ? ?Vision   ?   ?   ?Perception   ?  ?  Praxis   ?  ? ?Pertinent Vitals/Pain Pain Assessment ?Pain Assessment: 0-10 ?Pain Score: 8  ?Pain Location: lower abdominal pain ?Pain Descriptors / Indicators: Aching ?Pain Intervention(s): Limited activity within patient's tolerance, Monitored during session, Repositioned (pt continues to refuse pain meds)  ? ? ? ?Hand Dominance   ?  ?Extremity/Trunk Assessment Upper Extremity  Assessment ?Upper Extremity Assessment: Generalized weakness ?  ?Lower Extremity Assessment ?Lower Extremity Assessment: Generalized weakness ?  ?  ?  ?Communication Communication ?Communication: No difficulties ?  ?Cognition Arousal/Alertness: Awake/alert ?Behavior During Therapy: [facility] for tasks assessed/performed ?Overall Cognitive Status: No family/caregiver present to determine baseline cognitive functioning ?  ?  ?  ?  ?  ?  ?  ?  ?  ?  ?  ?  ?  ?  ?  ?  ?  ?  ?  ?General Comments    ? ?  ?Exercises   ?  ?Shoulder Instructions    ? ? ?Home Living Family/patient expects to be discharged to:: Skilled nursing facility ?Living Arrangements: Children ?Available Help at Discharge: Available 24 hours/day;Family (daughter works from home) ?Type of Home: House ?Home Access: Stairs to enter ?Entrance Stairs-Number of Steps: 2 ?Entrance Stairs-Rails: None ?Home Layout: Two level;Able to live on main level with bedroom/bathroom ?  ?  ?Bathroom Shower/Tub: Walk-in shower ?  ?Bathroom Toilet: Standard ?  ?  ?Home Equipment: Cane - single Barista (2 wheels);Rollator (4 wheels);Shower seat ?  ?  ?  ? ?  ?Prior Functioning/Environment Prior Level of Function : Needs assist ?  ?  ?  ?  ?  ?  ?Mobility Comments: Pt reports she's only out of the home for MD appointments ?ADLs Comments: gets help with bathing and general ADLs tasks ?  ? ?  ?  ?OT Problem List: Decreased strength;Impaired balance (sitting and/or standing);Decreased safety awareness;Pain;Decreased knowledge of use of DME or AE ?  ?   ?OT Treatment/Interventions: Self-care/ADL training;Therapeutic exercise;Therapeutic activities;DME and/or AE instruction;Patient/family education;Balance training  ?  ?OT Goals(Current goals can be found in the care plan section) Acute Rehab OT Goals ?Patient Stated Goal: have less pain and be as independent as possible taking care of myself ?OT Goal Formulation: With patient ?Time For Goal Achievement: 02/13/22 ?Potential  to Achieve Goals: Good ?ADL Goals ?Pt Will Perform Lower Body Dressing: with modified independence;sit to/from stand ?Pt Will Transfer to Toilet: with modified independence;ambulating (LRAD PRN) ?Additional ADL Goal #1: Pt will complete all aspects of bathing with remote supervision, seated, AE PRN.  ?OT Frequency: Min 2X/week ?  ? ?Co-evaluation   ?  ?  ?  ?  ? ?  ?AM-PAC OT "6 Clicks" Daily Activity     ?Outcome Measure Help from another person eating meals?: None ?Help from another person taking care of personal grooming?: A Little ?Help from another person toileting, which includes using toliet, bedpan, or urinal?: A Little ?Help from another person bathing (including washing, rinsing, drying)?: A Little ?Help from another person to put on and taking off regular upper body clothing?: A Little ?Help from another person to put on and taking off regular lower body clothing?: A Little ?6 Click Score: 19 ?  ?End of Session   ? ?Activity Tolerance: Patient tolerated treatment well ?Patient left: in chair;with call bell/phone within reach;with chair alarm set;with nursing/sitter in room ? ?OT Visit Diagnosis: Other abnormalities of gait and mobility (R26.89);Muscle weakness (generalized) (M62.81);History of falling (Z91.81)  ?              ?  Time: 0086-7619 ?OT Time Calculation (min): 16 min ?Charges:  OT General Charges ?$OT Visit: 1 Visit ?OT Evaluation ?$OT Eval Moderate Complexity: 1 Mod ? ?Ardeth Perfect., MPH, MS, OTR/L ?ascom [phone] ?01/30/22, 1:33 PM ?

## 2022-01-30 NOTE — Progress Notes (Signed)
Patient verbalized that someone helped her to the bathroom this morning to urinate and she noticed blood in her wipes. Went ahead to check and wipe on her vagina, there is a small tinge of blood.  ?[name] her daughter also expressed that they have a CAT Scan scheduled as outpatient and that they wanted to do it at [facility] before discharge. Advised to speak with Delilah if it is possible.  ?

## 2022-01-30 NOTE — TOC Progression Note (Addendum)
Transition of Care (TOC) - Progression Note  ? ? ?Patient Details  ?Name: Sabrina [name] ?MRN: [id number] ?Date of Birth: 1945/12/01 ? ?Transition of Care (TOC) CM/SW Contact  ?Conception Oms, RN ?Phone Number: ?01/30/2022, 4:32 PM ? ?Clinical Narrative:   Met with the patient and her daughter in the room, Reviewed  Medicare Star ratings, The patient would like to go to  [place], I notified [employer], and asked that they review the patient and make an offer, She will need Ins approval as well once offers are made ? ? ?[facility] made a bed offer and the patient accepted  ? ?TOC to start Ins auth on Sunday ? ? ?Expected Discharge Plan: [place] ?Barriers to Discharge: Continued Medical Work up, SNF Pending bed offer, Insurance Authorization ? ?Expected Discharge Plan and Services ?Expected Discharge Plan: [place] ?  ?  ?  ?  ?                ?  ?  ?  ?  ?  ?  ?  ?  ?  ?  ? ? ?Social Determinants of Health (SDOH) Interventions ?  ? ?Readmission Risk Interventions ?   ? View : No data to display.  ?  ?  ?  ? ? ?

## 2022-01-30 NOTE — Assessment & Plan Note (Addendum)
Started on empiric Rocephin Rocephin pending urine culture.  Urine culture growing Citrobacter koseri, pansensitive. ?-- Stop Rocephin ?-- Keflex 2 more days to complete course ?

## 2022-01-30 NOTE — Progress Notes (Signed)
?Progress Note ? ? ?Patient: Sabrina [name] VPX:[id number] DOB: [birth year] DOA: 01/28/2022     1 ?DOS: the patient was seen and examined on 01/30/2022 ?  ?Brief hospital course: ?Sabrina [name] is a 76 y.o. female with medical history significant for type 2 diabetes, hyperlipidemia, hypertension, recently diagnosed saddle PE status post thrombectomy 3 weeks ago, now on Eliquis, CVA, postmenopausal bleeding with concern for endometrial cancer who presented to [facility] ED at the recommendation of her primary care provider due to abnormal labs with elevated creatinine.  Patient reported generalized weakness since discharge 3 weeks ago, abdominal pain and distention. ? ?ED evaluation consistent with AKI, creatinine 3.07, BUN 32 with anion gap metabolic acidosis.  Vitals were stable and otherwise labs unremarkable. ? ?Assessment and Plan: ?* AKI (acute kidney injury) ([place]) ?Cr on admission 2.70>>3.07. ?Baseline appears ~ 1.0 ?Renal U/S normal. ?Monitor urine output. ?Hypervolemic, will avoid fluids, encourage PO hydration. ?Avoid nephrotoxins, hypotension. ?Renally dose meds. ? ?UTI (urinary tract infection) ?Continue Rocephin pending urine culture. ? ?Ascites ?Secondary to metastatic endometrial cancer. ? ?Thrombocytosis ?Likely reactive in setting of malignancy. ?Follow CBC. ? ?Type 2 diabetes mellitus with hyperlipidemia ([place]) ?Uncontrolled.  A1c 8.8% on 01/05/2022. ?Sensitive insulin sliding scale Novolog. ? ?GERD (gastroesophageal reflux disease) ?On PPI ? ?Stage IV adenocarcinoma of endometrium ([place]) ?Biopsy had just been obtained outpatient, pathology pending.  Peritoneal fluid cytology obtained this admission is preliminary but reflects malignancy. ?--Oncology and Palliative Care consulted ? ?Metabolic acidosis, increased anion gap ?POA due to renal failure. ?Improved. ?Monitor BMP. ? ?Generalized weakness ?PT/OT recommend SNF. ?TOC following.   ?Fall precautions. ?Up out of bed at least daily. ?Mobilize frequently as  tolerated. ? ?Bipolar affective disorder, manic, severe, with psychotic behavior ([place]) ?Not on any psych meds per med history. ?Psychiatry consulted to determine capacity for decision making at the request of Oncology. ?--Pt does have capacity ?--No acute issues or suicidality ? ? ? ? ?  ? ?Subjective: Pt awake up in recliner when seen.  She denies acute complaints.  She says she knows she has cancer and does not want any chemo or aggressive treatments.  Denies pain, N/V, F/C.  Says abdomen feels better after fluid removed yesterday. ? ?Physical Exam: ?Vitals:  ? 01/30/22 0032 01/30/22 0500 01/30/22 0510 01/30/22 1735  ?BP: 127/66  122/64 136/65  ?Pulse: 85  91 98  ?Resp: '16  16 16  '$ ?Temp: 97.7 ?F (36.5 ?C)  98.4 ?F (36.9 ?C) 98.8 ?F (37.1 ?C)  ?TempSrc:      ?SpO2: 100%  95% 94%  ?Weight:  58.7 kg    ?Height:      ? ?General exam: awake, alert, no acute distress ?HEENT: moist mucus membranes, hearing grossly normal  ?Respiratory system: CTAB, no wheezes, rales or rhonchi, normal respiratory effort. ?Cardiovascular system: normal S1/S2, RRR, no JVD, murmurs, rubs, gallops, no pedal edema.   ?Gastrointestinal system: softer abdomen s/p paracentesis, non-tender ?Central nervous system: A&O x4. no gross focal neurologic deficits, normal speech ?Extremities: moves all , no edema, normal tone ?Skin: dry, intact, normal temperature ?Psychiatry: normal mood, congruent affect, judgement and insight appear normal ? ? ?Data Reviewed: ? ?Labs notable for K 3.4, glucose 141, BUN 27, Cr 2.74, Albumine 3.0, GFR 18, Hbg 9.3, Plt 580 ? ?Family Communication: none at bedside will attempt to call.   ? ?Disposition: ?Status is: Inpatient ?Remains inpatient appropriate because: ongoing acute renal failure ? ? Planned Discharge Destination: Skilled nursing facility ? ? ? ?Time spent: 45 minutes  including time spent at bedside and in coordination of care with consultants and ancillary staff. ? ?Author: ?Ezekiel Slocumb, DO ?01/30/2022  7:35 PM ? ?For on call review [email or web address].  ?

## 2022-01-30 NOTE — Assessment & Plan Note (Signed)
Likely reactive in setting of malignancy. ?Follow CBC. ?

## 2022-01-30 NOTE — Consult Note (Addendum)
[facility] Face-to-Face Psychiatry Consult  ? ?Reason for Consult:  capacity  ?Referring Physician:  EDP ?Patient Identification: Sabrina [name] ?MRN:  [id number] ?Principal Diagnosis: Hepatorenal syndrome ([place]) ?Diagnosis:  Principal Problem: ?  Hepatorenal syndrome ([place]) ?Active Problems: ?  Dementia with aggressive behavior ([place]) ?  Type 2 diabetes mellitus with hyperlipidemia ([place]) ?  Hyperlipidemia ?  Bipolar affective disorder, manic, severe, with psychotic behavior ([place]) ?  Generalized weakness ?  GERD (gastroesophageal reflux disease) ?  Thrombocytosis ?  Metabolic acidosis, increased anion gap ?  UTI (urinary tract infection) ?  Ascites ?  Abnormal uterine bleeding (AUB) ?  Endometrial cancer ([place]) ?  AKI (acute kidney injury) ([place]) ? ? ?Total Time spent with patient: 45 minutes ? ?Subjective:   ?Sabrina [name] is a 76 y.o. female patient admitted with kidney issues, consult for capacity. ? ?HPI:  76 yo female with a recent 7 day stay in the ICU and return for medical issues.  On assessment, she states she is at [employer] [place] [facility] [place] [facility] [place].  I was born in 1946/09/30 and when they made [employer], they broke the mold."  She was not happy that "they called the crazy doctor" but did participate in the assessment.  Ms. Yodice did state, "I would gladly go to a place to get some rehab and already decided on one."  Denies any depression or anxiety or any mental health needs.  Her daughter spoke with me outside of the room and reports she is "psychotic".  When asked what her what she meant by this, she stated, "She saying stuff like she's not going to let them cut her open like a fish" and did not get follow up care after she left in the past for her cancer.  This provider let her know that capacity can change but right now, her mother knew she needed rehab and was agreeable to go.  The client has capacity to make her own medical decisions. ? ?Caveat:  Problem list notes  dementia, no medications or prior neurology report for dementia ? ?Past Psychiatric History: anxiety, brief psychosis in the past ? ?Risk to Self:  none ?Risk to Others:  none ?Prior Inpatient Therapy:  once ?Prior Outpatient Therapy:  none ? ?Past Medical History:  ?Past Medical History:  ?Diagnosis Date  ? Diabetes mellitus without complication ([place])   ? Endometrial cancer ([place]) 01/29/2022  ? High cholesterol   ? Hypertension   ?  ?Past Surgical History:  ?Procedure Laterality Date  ? DILATION AND CURETTAGE OF UTERUS    ? POLYPECTOMY    ? PULMONARY THROMBECTOMY Bilateral 01/08/2022  ? Procedure: PULMONARY THROMBECTOMY;  Surgeon: Algernon Huxley, MD;  Location: [facility];  Service: Cardiovascular;  Laterality: Bilateral;  ? ?Family History:  ?Family History  ?Problem Relation Age of Onset  ? Breast cancer Maternal Aunt   ?     2 aunts in their 31's  ? Breast cancer Maternal Aunt 6  ? Ovarian cancer Neg Hx   ? Colon cancer Neg Hx   ? ?Family Psychiatric  History: see above ?Social History:  ?Social History  ? ?Substance and Sexual Activity  ?Alcohol Use No  ?   ?Social History  ? ?Substance and Sexual Activity  ?Drug Use No  ?  ?Social History  ? ?Socioeconomic History  ? Marital status: Divorced  ?  Spouse name: Not on file  ? Number of children: Not  on file  ? Years of education: Not on file  ? Highest education level: Not on file  ?Occupational History  ? Occupation: retired  ?Tobacco Use  ? Smoking status: Never  ? Smokeless tobacco: Never  ?Vaping Use  ? Vaping Use: Never used  ?Substance and Sexual Activity  ? Alcohol use: No  ? Drug use: No  ? Sexual activity: Not Currently  ?  Birth control/protection: Post-menopausal  ?Other Topics Concern  ? Not on file  ?Social History Narrative  ? Not on file  ? ?Social Determinants of Health  ? ?Financial Resource Strain: Not on file  ?Food Insecurity: Not on file  ?Transportation Needs: Not on file  ?Physical Activity: Not on file  ?Stress: Not on file  ?Social  Connections: Not on file  ? ?Additional Social History: ?  ? ?Allergies:   ?Allergies  ?Allergen Reactions  ? Glimepiride Diarrhea and Other (See Comments)  ? Glipizide Other (See Comments)  ? Lisinopril Other (See Comments)  ?  headache  ? Losartan Other (See Comments)  ?  Headache  ? Norco [Hydrocodone-Acetaminophen] Other (See Comments)  ?  Mood changes  ? Pioglitazone Swelling  ?  On legs  ? Penicillins Diarrhea and Rash  ? ? ?Labs:  ?Results for orders placed or performed during the hospital encounter of 01/28/22 (from the past 48 hour(s))  ?CBC with Differential     Status: Abnormal  ? Collection Time: 01/29/22 12:11 AM  ?Result Value Ref Range  ? WBC 7.2 4.0 - 10.5 K/uL  ? RBC 3.57 (L) 3.87 - 5.11 MIL/uL  ? Hemoglobin 9.7 (L) 12.0 - 15.0 g/dL  ? HCT 31.6 (L) 36.0 - 46.0 %  ? MCV 88.5 80.0 - 100.0 fL  ? MCH 27.2 26.0 - 34.0 pg  ? MCHC 30.7 30.0 - 36.0 g/dL  ? RDW 14.0 11.5 - 15.5 %  ? Platelets 613 (H) 150 - 400 K/uL  ? nRBC 0.0 0.0 - 0.2 %  ? Neutrophils Relative % 77 %  ? Neutro Abs 5.6 1.7 - 7.7 K/uL  ? Lymphocytes Relative 15 %  ? Lymphs Abs 1.1 0.7 - 4.0 K/uL  ? Monocytes Relative 7 %  ? Monocytes Absolute 0.5 0.1 - 1.0 K/uL  ? Eosinophils Relative 1 %  ? Eosinophils Absolute 0.0 0.0 - 0.5 K/uL  ? Basophils Relative 0 %  ? Basophils Absolute 0.0 0.0 - 0.1 K/uL  ? Immature Granulocytes 0 %  ? Abs Immature Granulocytes 0.02 0.00 - 0.07 K/uL  ?  Comment: Performed at [facility], [address] [place], [place] [address]  ?Comprehensive metabolic panel     Status: Abnormal  ? Collection Time: 01/29/22 12:11 AM  ?Result Value Ref Range  ? Sodium 136 135 - 145 mmol/L  ? Potassium 3.9 3.5 - 5.1 mmol/L  ? Chloride 102 98 - 111 mmol/L  ? CO2 17 (L) 22 - 32 mmol/L  ? Glucose, Bld 130 (H) 70 - 99 mg/dL  ?  Comment: Glucose reference range applies only to samples taken after fasting for at least 8 hours.  ? BUN 32 (H) 8 - 23 mg/dL  ? Creatinine, Ser 3.07 (H) 0.44 - 1.00 mg/dL  ? Calcium 9.0 8.9 - 10.3  mg/dL  ? Total Protein 7.0 6.5 - 8.1 g/dL  ? Albumin 2.9 (L) 3.5 - 5.0 g/dL  ? AST 27 15 - 41 U/L  ? ALT 14 0 - 44 U/L  ? Alkaline Phosphatase 54 38 -  126 U/L  ? Total Bilirubin 0.8 0.3 - 1.2 mg/dL  ? GFR, Estimated 15 (L) >60 mL/min  ?  Comment: (NOTE) ?Calculated using the CKD-EPI Creatinine Equation (2021) ?  ? Anion gap 17 (H) 5 - 15  ?  Comment: Performed at [facility], [address] [place], [place] [address]  ?Troponin I (High Sensitivity)     Status: None  ? Collection Time: 01/29/22 12:11 AM  ?Result Value Ref Range  ? Troponin I (High Sensitivity) 8 <18 ng/L  ?  Comment: (NOTE) ?Elevated high sensitivity troponin I (hsTnI) values and significant  ?changes across serial measurements may suggest ACS but many other  ?chronic and acute conditions are known to elevate hsTnI results.  ?Refer to the "Links" section for chest pain algorithms and additional  ?guidance. ?Performed at [facility], [place], ?[place] [address] ?  ?Urinalysis, Complete w Microscopic     Status: Abnormal  ? Collection Time: 01/29/22  2:54 AM  ?Result Value Ref Range  ? Color, Urine YELLOW (A) YELLOW  ? APPearance CLOUDY (A) CLEAR  ? Specific Gravity, Urine 1.016 1.005 - 1.030  ? pH 5.0 5.0 - 8.0  ? Glucose, UA NEGATIVE NEGATIVE mg/dL  ? Hgb urine dipstick MODERATE (A) NEGATIVE  ? Bilirubin Urine NEGATIVE NEGATIVE  ? Ketones, ur 5 (A) NEGATIVE mg/dL  ? Protein, ur 100 (A) NEGATIVE mg/dL  ? Nitrite NEGATIVE NEGATIVE  ? Leukocytes,Ua MODERATE (A) NEGATIVE  ? RBC / HPF 6-10 0 - 5 RBC/hpf  ? WBC, UA >50 (H) 0 - 5 WBC/hpf  ? Bacteria, UA MANY (A) NONE SEEN  ? Squamous Epithelial / LPF 0-5 0 - 5  ? WBC Clumps PRESENT   ? Mucus PRESENT   ? Hyaline Casts, UA PRESENT   ?  Comment: Performed at [facility], [address], [place], [place] [address]  ?Urine Culture     Status: Abnormal (Preliminary result)  ? Collection Time: 01/29/22  2:54 AM  ? Specimen: Urine, Random  ?Result Value Ref Range  ?  Specimen Description    ?  URINE, RANDOM ?Performed at [facility], [address], [place], [place] [address] ?  ? Special Requests    ?  NONE ?Performed at [facility], [address]

## 2022-01-31 DIAGNOSIS — N179 Acute kidney failure, unspecified: Secondary | ICD-10-CM | POA: Diagnosis not present

## 2022-01-31 LAB — CBC
HCT: 30 % — ABNORMAL LOW (ref 36.0–46.0)
Hemoglobin: 9.4 g/dL — ABNORMAL LOW (ref 12.0–15.0)
MCH: 26.9 pg (ref 26.0–34.0)
MCHC: 31.3 g/dL (ref 30.0–36.0)
MCV: 86 fL (ref 80.0–100.0)
Platelets: 394 10*3/uL (ref 150–400)
RBC: 3.49 MIL/uL — ABNORMAL LOW (ref 3.87–5.11)
RDW: 14 % (ref 11.5–15.5)
WBC: 5.6 10*3/uL (ref 4.0–10.5)
nRBC: 0 % (ref 0.0–0.2)

## 2022-01-31 LAB — GLUCOSE, CAPILLARY
Glucose-Capillary: 136 mg/dL — ABNORMAL HIGH (ref 70–99)
Glucose-Capillary: 150 mg/dL — ABNORMAL HIGH (ref 70–99)
Glucose-Capillary: 153 mg/dL — ABNORMAL HIGH (ref 70–99)
Glucose-Capillary: 160 mg/dL — ABNORMAL HIGH (ref 70–99)
Glucose-Capillary: 133 mg/dL — ABNORMAL HIGH (ref 70–99)
Glucose-Capillary: 139 mg/dL — ABNORMAL HIGH (ref 70–99)
Glucose-Capillary: 114 mg/dL — ABNORMAL HIGH (ref 70–99)

## 2022-01-31 LAB — COMPREHENSIVE METABOLIC PANEL
ALT: 11 U/L (ref 0–44)
AST: 25 U/L (ref 15–41)
Albumin: 2.8 g/dL — ABNORMAL LOW (ref 3.5–5.0)
Alkaline Phosphatase: 46 U/L (ref 38–126)
Anion gap: 12 (ref 5–15)
BUN: 25 mg/dL — ABNORMAL HIGH (ref 8–23)
CO2: 24 mmol/L (ref 22–32)
Calcium: 9.1 mg/dL (ref 8.9–10.3)
Chloride: 102 mmol/L (ref 98–111)
Creatinine, Ser: 2.74 mg/dL — ABNORMAL HIGH (ref 0.44–1.00)
GFR, Estimated: 18 mL/min — ABNORMAL LOW (ref >60)
Glucose, Bld: 154 mg/dL — ABNORMAL HIGH (ref 70–99)
Potassium: 3.4 mmol/L — ABNORMAL LOW (ref 3.5–5.1)
Sodium: 138 mmol/L (ref 135–145)
Total Bilirubin: 0.8 mg/dL (ref 0.3–1.2)
Total Protein: 6.2 g/dL — ABNORMAL LOW (ref 6.5–8.1)

## 2022-01-31 LAB — URINE CULTURE: Culture: >=100000 — AB

## 2022-01-31 LAB — IRON AND TIBC
Iron: 35 ug/dL (ref 28–170)
Saturation Ratios: 16 % (ref 10.4–31.8)
TIBC: 216 ug/dL — ABNORMAL LOW (ref 250–450)
UIBC: 181 ug/dL

## 2022-01-31 LAB — VITAMIN B12: Vitamin B-12: 862 pg/mL (ref 180–914)

## 2022-01-31 LAB — PHOSPHORUS: Phosphorus: 2.2 mg/dL — ABNORMAL LOW (ref 2.5–4.6)

## 2022-01-31 LAB — FERRITIN: Ferritin: 739 ng/mL — ABNORMAL HIGH (ref 11–307)

## 2022-01-31 LAB — FOLATE: Folate: 7.2 ng/mL (ref >5.9)

## 2022-01-31 MED ORDER — CEPHALEXIN 250 MG PO CAPS
250.0000 mg | ORAL_CAPSULE | Freq: Two times a day (BID) | ORAL | Status: AC
  Administered 2022-02-01 – 2022-02-02 (×4): 250 mg via ORAL
  Filled 2022-01-31 (×4): qty 1

## 2022-01-31 MED ORDER — POTASSIUM CHLORIDE CRYS ER 20 MEQ PO TBCR
40.0000 meq | EXTENDED_RELEASE_TABLET | ORAL | Status: AC
  Administered 2022-01-31 (×2): 40 meq via ORAL
  Filled 2022-01-31 (×2): qty 2

## 2022-01-31 MED ORDER — ACETAMINOPHEN 500 MG PO TABS
1000.0000 mg | ORAL_TABLET | Freq: Four times a day (QID) | ORAL | Status: DC | PRN
Start: 2022-01-31 — End: 2022-02-03
  Administered 2022-01-31 – 2022-02-03 (×3): 1000 mg via ORAL
  Filled 2022-01-31 (×4): qty 2

## 2022-01-31 MED ORDER — ACETAMINOPHEN 325 MG PO TABS
ORAL_TABLET | ORAL | Status: AC
Start: 2022-01-31 — End: 2022-01-31
  Administered 2022-01-31: 650 mg via ORAL
  Filled 2022-01-31: qty 2

## 2022-01-31 NOTE — Consult Note (Signed)
ANGELENE QUIZON MRN: [id number] DOB/AGE: [birth year] 76 y.o. Primary Care Physician:Sparks, Duane Lope, MD Admit date: 01/28/2022 Chief Complaint:  Chief Complaint  Patient presents with   Weakness   HPI: Patient is a 76 year old African-American female with a past medical history diabetes mellitus type 2, hyperlipidemia, hypertension, recent admission with pulmonary embolism s/p thrombectomy 3 weeks ago, now on anticoagulation-Eliquis, history of CVA, history of postmenopausal bleeding who came to the ER with chief complaint of generalized weakness.Marland Kitchen   History of present illness dates back to March 13 when patient was admitted with acute saddle pulmonary embolism. Patient was discharged on March 19 with the plan to follow-up with OB/GYN and hematologist. Patient did see GYN on April 5 and underwent endometrial biopsy. Patient thereafter came to the ER on April 6 because of weakness Upon evaluation in the ER patient was found to have creatinine of 3.07. On examination patient had large abdominal ascites Patient was admitted for further care. Patient underwent ascitic tap.  Nephrology was consulted  Patient was seen today on first floor Patient offers no new specific physical complaint. Patient main concern in today visit was how painful it was when she underwent endometrial biopsy Patient went on to describe how they took fluid out of her belly Patient states she is feeling better than before. Patient offers no complaint of chest pain No complaint of shortness of breath No complaint nausea vomiting or diarrhea Patient's family was present in the room patient family offers no new specific concerns either  Past Medical History:  Diagnosis Date   Diabetes mellitus without complication (HCC)    Endometrial cancer (HCC) 01/29/2022   High cholesterol    Hypertension         Family History  Problem Relation Age of Onset   Breast cancer Maternal Aunt        2 aunts in their 66's    Breast cancer Maternal Aunt 8   Ovarian cancer Neg Hx    Colon cancer Neg Hx     Social History:  reports that she has never smoked. She has never used smokeless tobacco. She reports that she does not drink alcohol and does not use drugs.   Allergies:  Allergies  Allergen Reactions   Glimepiride Diarrhea and Other (See Comments)   Glipizide Other (See Comments)   Lisinopril Other (See Comments)    headache   Losartan Other (See Comments)    Headache   Norco [Hydrocodone-Acetaminophen] Other (See Comments)    Mood changes   Pioglitazone Swelling    On legs   Penicillins Diarrhea and Rash    Medications Prior to Admission  Medication Sig Dispense Refill   metFORMIN (GLUCOPHAGE) 500 MG tablet Take 1,000 mg by mouth 2 (two) times daily.     pantoprazole (PROTONIX) 40 MG tablet Take 1 tablet (40 mg total) by mouth daily. 30 tablet 0   propranolol ER (INDERAL LA) 60 MG 24 hr capsule Take 60 mg by mouth daily.     spironolactone (ALDACTONE) 25 MG tablet Take 25 mg by mouth daily.     Accu-Chek Softclix Lancets lancets      Accu-Chek Softclix Lancets lancets Use to check blood sugar twice a day as directed.     apixaban (ELIQUIS) 5 MG TABS tablet Two tablets twice a day for six days, then One tablet twice a day afterwards (Patient not taking: Reported on 01/28/2022) 72 tablet 0   furosemide (LASIX) 20 MG tablet Take by mouth. (Patient not taking: Reported  on 01/28/2022)     insulin detemir (LEVEMIR) 100 UNIT/ML injection Inject 100 Units into the skin daily. (Patient not taking: Reported on 01/30/2022)     ondansetron (ZOFRAN-ODT) 4 MG disintegrating tablet Take by mouth.     polyethylene glycol (MIRALAX / GLYCOLAX) 17 g packet Take 17 g by mouth daily as needed for moderate constipation. 30 each 0       TFT:DDUKG from the symptoms mentioned above,there are no other symptoms referable to all systems reviewed.   enoxaparin (LOVENOX) injection  30 mg Subcutaneous Q24H   insulin aspart   0-6 Units Subcutaneous Q4H   pantoprazole  40 mg Oral Daily   polyethylene glycol  17 g Oral Daily   potassium chloride  40 mEq Oral Q4H   senna-docusate  1 tablet Oral BID        Physical Exam: Vital signs in last 24 hours: Temp:  [98.4 F (36.9 C)-98.9 F (37.2 C)] 98.4 F (36.9 C) (04/08 0735) Pulse Rate:  [93-100] 96 (04/08 0735) Resp:  [15-16] 15 (04/08 0735) BP: (104-136)/(55-78) 120/68 (04/08 0735) SpO2:  [94 %-99 %] 98 % (04/08 0735) Weight:  [55.1 kg] 55.1 kg (04/08 0500) Weight change: -9.1 kg Last BM Date :  (PTA)  Intake/Output from previous day: No intake/output data recorded. No intake/output data recorded.   Physical Exam: General- pt is awake,alert, oriented to time place and person Resp- No acute REsp distress, CTA B/L NO Rhonchi CVS- S1S2 regular in rate and rhythm GIT- BS+, soft, NT,Distended EXT- no LE Edema, Cyanosis CNS- CN 2-12 grossly intact. Moving all 4 extremities Psych- normal mod and affect    Lab Results: CBC Recent Labs    01/30/22 0416 01/31/22 0520  WBC 5.6 5.6  HGB 9.3* 9.4*  HCT 29.7* 30.0*  PLT 580* 394    BMET Recent Labs    01/30/22 0416 01/31/22 0520  NA 137 138  K 3.4* 3.4*  CL 101 102  CO2 22 24  GLUCOSE 141* 154*  BUN 27* 25*  CREATININE 2.74* 2.74*  CALCIUM 8.9 9.1    MICRO Recent Results (from the past 240 hour(s))  Urine Culture     Status: Abnormal   Collection Time: 01/29/22  2:54 AM   Specimen: Urine, Random  Result Value Ref Range Status   Specimen Description   Final    URINE, RANDOM Performed at [facility], [address], [place], [place] [address]    Special Requests   Final    NONE Performed at [facility], [address] [place], [place] [address]    Culture >=100,000 COLONIES/mL CITROBACTER KOSERI (A)  Final   Report Status 01/31/2022 FINAL  Final   Organism ID, Bacteria CITROBACTER KOSERI (A)  Final      Susceptibility   Citrobacter koseri - MIC*     CEFAZOLIN <=4 SENSITIVE Sensitive     CEFEPIME <=0.12 SENSITIVE Sensitive     CEFTRIAXONE <=0.25 SENSITIVE Sensitive     CIPROFLOXACIN <=0.25 SENSITIVE Sensitive     GENTAMICIN <=1 SENSITIVE Sensitive     IMIPENEM <=0.25 SENSITIVE Sensitive     NITROFURANTOIN 32 SENSITIVE Sensitive     TRIMETH/SULFA <=20 SENSITIVE Sensitive     PIP/TAZO <=4 SENSITIVE Sensitive     * >=100,000 COLONIES/mL CITROBACTER KOSERI  Body fluid culture w Gram Stain     Status: None (Preliminary result)   Collection Time: 01/29/22  1:26 PM   Specimen: PATH Cytology Peritoneal fluid  Result Value Ref Range  Status   Specimen Description   Final    PERITONEAL Performed at [facility], [address] [place], [place] [address]    Special Requests   Final    NONE Performed at [facility], [address] [place], [place] [address]    Gram Stain   Final    FEW WBC PRESENT, PREDOMINANTLY MONONUCLEAR NO ORGANISMS SEEN    Culture   Final    NO GROWTH < 12 HOURS Performed at [facility], [address] [place], [place] [address]    Report Status PENDING  Incomplete      Lab Results  Component Value Date   CALCIUM 9.1 01/31/2022   PHOS 3.0 01/30/2022      Renal ultrasound done on January 29, 2022  FINDINGS: Right Kidney:   Renal measurements: 9.8 x 5.1 x 5.2 cm = volume: 134 mL. Echogenicity within normal limits. No mass or hydronephrosis visualized.   Left Kidney:   Renal measurements: 9.5 x 4.5 x 4.0 cm = volume: 91 mL. Echogenicity within normal limits. No mass or hydronephrosis visualized.   Bladder:   Appears normal for degree of bladder distention.   Other:   Large ascites   IMPRESSION: Normal renal ultrasound   Ascites   Impression:   1)Renal   Acute kidney injury Patient has AKI secondary to ATN There is a possibility of hepatorenal syndrome Patient creatinine has increased from her baseline of 1.04--1.3 to now 3.07 Patient most likely has  CKD Data in favor of CKD is that patient has had proteinuria Patient has CKD most likely from diabetes mellitus Patient UA shows protein going back to 2017   Patient's renal ultrasound was reviewed patient's renal ultrasound does not show any obstructive issues   2)HTN Blood pressure is stable  3)Anemia of chronic disease HGb at goal (9--11)  We will check anemia profile  4)CKD Mineral-Bone Disorder-secondary hyperparathyroidism  Secondary Hyperparathyroidism w/u pending  5)Stage IV adenocarcinoma of endometrium Patient being followed by oncology and primary team   6)Hypokalemia Being replete We will follow   7)Acute metabolic acidosis Patient had bicarb of 17 at the time of presentation Patient bicarb is now better     Plan:   Patient has complicated medical issues of acute kidney injury, chronic kidney disease, hypertension, anemia of chronic disease, stage IV adenocarcinoma of endometrium, hypokalemia, acute metabolic acidosis and hypertension. Patient creatinine has trended down from 3.0 to 2.7 we will hold off on starting IV fluids in an effort to avoid accumulation of ascites  We will ask for anemia profile  We will check patient phosphorus  We will check patient's intact PTH  We will check patient's proteinuria  Will ask for urine sodium    Alfonzo Arca s Bruchy Mikel 01/31/2022, 9:28 AM

## 2022-01-31 NOTE — Progress Notes (Signed)
?Progress Note ? ? ?Patient: Sabrina [name] YBO:[id number] DOB: [birth year] DOA: 01/28/2022     2 ?DOS: the patient was seen and examined on 01/31/2022 ?  ?Brief hospital course: ?Sabrina [name] is a 76 y.o. female with medical history significant for type 2 diabetes, hyperlipidemia, hypertension, recently diagnosed saddle PE status post thrombectomy 3 weeks ago, now on Eliquis, CVA, postmenopausal bleeding with concern for endometrial cancer who presented to [facility] ED at the recommendation of her primary care provider due to abnormal labs with elevated creatinine.  Patient reported generalized weakness since discharge 3 weeks ago, abdominal pain and distention. ? ?ED evaluation consistent with AKI, creatinine 3.07, BUN 32 with anion gap metabolic acidosis.  Vitals were stable and otherwise labs unremarkable. ? ?Assessment and Plan: ?* AKI (acute kidney injury) ([place]) ?Cr on admission 2.70>>3.07>>2.74 (x2 d). ?Baseline appears ~ 1.0 ?Renal U/S normal. ?Monitor urine output. ?Hypervolemic, will avoid fluids, encourage PO hydration. ?Avoid nephrotoxins, hypotension. ?Renally dose meds. ?Nephrology consulted. ? ?UTI (urinary tract infection) ?Started on empiric Rocephin Rocephin pending urine culture.  Urine culture growing Citrobacter koseri, pansensitive. ?-- Stop Rocephin ?-- Keflex 2 more days to complete course ? ?Ascites ?Secondary to metastatic endometrial cancer. Status post 2.4 L fluid removed by paracentesis on 4/6. ?--Monitor for re-accumulation ? ?Thrombocytosis ?Likely reactive in setting of malignancy. ?Follow CBC. ? ?Type 2 diabetes mellitus with hyperlipidemia ([place]) ?Uncontrolled.  A1c 8.8% on 01/05/2022. ?Sensitive insulin sliding scale Novolog. ? ?GERD (gastroesophageal reflux disease) ?On PPI ? ?Stage IV adenocarcinoma of endometrium ([place]) ?Biopsy had just been obtained outpatient, pathology pending.  Peritoneal fluid cytology obtained this admission is preliminary but reflects malignancy. ?--Oncology and  Palliative Care consulted ? ?Metabolic acidosis, increased anion gap ?POA due to renal failure. ?Improved. ?Monitor BMP. ? ?Generalized weakness ?PT/OT recommend SNF. ?TOC following.   ?Fall precautions. ?Up out of bed at least daily. ?Mobilize frequently as tolerated. ? ?Bipolar affective disorder, manic, severe, with psychotic behavior ([place]) ?Not on any psych meds per med history. ?Psychiatry consulted to determine capacity for decision making at the request of Oncology. ?--Pt does have capacity ?--No acute issues or suicidality ? ? ? ? ?  ? ?Subjective: Patient awake sitting up in bed when seen today.  She is in her typical cantankerous spirits.  Requesting chaplain to visit.  Denies abdominal pain or other symptomatic complaints.  She is quite tangential and pressured speech, often content unrelated to our conversation.  She tells me to talk to, Sabrina [name] and Sabrina [name] about her cancer.  She remembers me being her doctor during a prior admission, my recommendation at that time that she get COVID vaccines which she does not believe in.  She then told me "leave now.  Leave now", when I said see you tomorrow she said I hope not. ? ?Physical Exam: ?Vitals:  ? 01/30/22 2001 01/31/22 0342 01/31/22 0500 01/31/22 0735  ?BP: (!) 104/55 131/78  120/68  ?Pulse: 93 100  96  ?Resp: '16 16  15  '$ ?Temp: 98.9 ?F (37.2 ?C) 98.8 ?F (37.1 ?C)  98.4 ?F (36.9 ?C)  ?TempSrc:  Oral    ?SpO2: 99% 99%  98%  ?Weight:   55.1 kg   ?Height:      ? ?General exam: awake, alert, no acute distress, temporal wasting ?HEENT: moist mucus membranes, hearing grossly normal  ?Respiratory system: normal respiratory effort, on room air. ?Cardiovascular system: RRR, no pedal edema.   ?Gastrointestinal system: Soft but seems slightly more distended since yesterday, nontender. ?  Central nervous system: A&O x4. no gross focal neurologic deficits, normal speech ?Extremities: moves all, no cyanosis, normal tone ?Skin: dry, intact, normal  temperature ?Psychiatry: normal mood, congruent affect, judgement and insight appear questionable, pressured speech with tangential content, difficult to redirect ? ? ?Data Reviewed: ? ?Labs reviewed and notable for Potassium 3.4, glucose 154, BUN 25, Creatinine unchanged 2.74, phosphorus 2.2, albumin 2.8, total protein 6.2, GFR 18.  Hemoglobin 9.4 ? ?Family Communication: Daughter, Sabrina [name], updated by phone this afternoon. ? ?Disposition: ?Status is: Inpatient ?Remains inpatient appropriate because: ongoing renal failure ? ? Planned Discharge Destination: Skilled nursing facility ? ? ? ?Time spent: 35 minutes including time spent at bedside and in coordination of care with consultants and ancillary staff. ? ?Author: ?Ezekiel Slocumb, DO ?01/31/2022 2:16 PM ? ?For on call review [email or web address].  ?

## 2022-01-31 NOTE — Progress Notes (Signed)
Called report to 1C, pt to move to Rm 127 via wheelchair. ?

## 2022-01-31 NOTE — Progress Notes (Addendum)
?   01/31/22 1245  ?Clinical Encounter Type  ?Visited With Patient  ?Visit Type Initial;Spiritual support;Social support  ?Referral From Patient  ?Consult/Referral To Chaplain  ?Spiritual Encounters  ?Spiritual Needs Prayer  ? ?Chaplain Burris responded to Pt's request for spiritual care through presence and prayer. Pt stated that 4/7 was birthday of her late sister. Pt also seeking general support and Chaplain Burris offered some scripture readings that well appreciated. ? ?Initially Pt quite perturbed and frustrated that a chaplain did not visit her earlier and the timing of her late sister's birthday seems to have been the triggering event. Chaplain B reassured her of importance to staff engaged Pt in a manner to help Pt feel attended to and respected. ? ?Chaplain B remains available 4/8 for support as needed. ?

## 2022-01-31 NOTE — Progress Notes (Signed)
Requested for a Chaplain or Rev. Janne Lab.  ?

## 2022-01-31 NOTE — Plan of Care (Signed)

## 2022-01-31 NOTE — Plan of Care (Signed)
?  Problem: Health Behavior/Discharge Planning: ?Goal: Ability to manage health-related needs will improve ?01/31/2022 2311 by Teodoro Spray, RN ?Outcome: Progressing ?01/31/2022 2052 by Teodoro Spray, RN ?Outcome: Progressing ?  ?

## 2022-01-31 NOTE — Plan of Care (Signed)
  Problem: Health Behavior/Discharge Planning: Goal: Ability to manage health-related needs will improve Outcome: Progressing   

## 2022-01-31 NOTE — Progress Notes (Signed)
Daughter, [name] wants to talk to the MD regarding her CAT Scan. Gave her phone numbers to MD as requested. MD said CT Scan was already done when she first came in.  ?

## 2022-02-01 DIAGNOSIS — N179 Acute kidney failure, unspecified: Secondary | ICD-10-CM | POA: Diagnosis not present

## 2022-02-01 LAB — BASIC METABOLIC PANEL
Anion gap: 9 (ref 5–15)
BUN: 23 mg/dL (ref 8–23)
CO2: 25 mmol/L (ref 22–32)
Calcium: 9.1 mg/dL (ref 8.9–10.3)
Chloride: 106 mmol/L (ref 98–111)
Creatinine, Ser: 2.45 mg/dL — ABNORMAL HIGH (ref 0.44–1.00)
GFR, Estimated: 20 mL/min — ABNORMAL LOW (ref >60)
Glucose, Bld: 170 mg/dL — ABNORMAL HIGH (ref 70–99)
Potassium: 4 mmol/L (ref 3.5–5.1)
Sodium: 140 mmol/L (ref 135–145)

## 2022-02-01 LAB — GLUCOSE, CAPILLARY
Glucose-Capillary: 129 mg/dL — ABNORMAL HIGH (ref 70–99)
Glucose-Capillary: 131 mg/dL — ABNORMAL HIGH (ref 70–99)
Glucose-Capillary: 146 mg/dL — ABNORMAL HIGH (ref 70–99)
Glucose-Capillary: 160 mg/dL — ABNORMAL HIGH (ref 70–99)
Glucose-Capillary: 168 mg/dL — ABNORMAL HIGH (ref 70–99)
Glucose-Capillary: 191 mg/dL — ABNORMAL HIGH (ref 70–99)

## 2022-02-01 LAB — PROTEIN, URINE, RANDOM: Total Protein, Urine: 239 mg/dL

## 2022-02-01 LAB — SODIUM, URINE, RANDOM: Sodium, Ur: 30 mmol/L

## 2022-02-01 LAB — CREATININE, URINE, RANDOM: Creatinine, Urine: 176 mg/dL

## 2022-02-01 MED ORDER — INSULIN ASPART 100 UNIT/ML IJ SOLN
0.0000 [IU] | Freq: Three times a day (TID) | INTRAMUSCULAR | Status: DC
  Administered 2022-02-01 – 2022-02-02 (×5): 1 [IU] via SUBCUTANEOUS
  Filled 2022-02-01 (×5): qty 1

## 2022-02-01 NOTE — TOC Progression Note (Signed)
Transition of Care (TOC) - Progression Note  ? ? ?Patient Details  ?Name: TAUNJA BRICKNER ?MRN: [id number] ?Date of Birth: [birth year] ? ?Transition of Care (TOC) CM/SW Contact  ?Izola Price, RN ?Phone Number: ?02/01/2022, 3:35 PM ? ?Clinical Narrative: 4/9: Attempted insurance authorization and called [facility], but patient is not managed by [employer]. SNF to get authorization. Attempted to contact [facility] or someone at [facility] but VM for authorization person was full likely due to [facility] weekend/Easter Sunday as a barrier. Simmie Davies RN CM     ? ? ? ?Expected Discharge Plan: [place] ?Barriers to Discharge: Continued Medical Work up, SNF Pending bed offer, Insurance Authorization ? ?Expected Discharge Plan and Services ?Expected Discharge Plan: [place] ?  ?  ?  ?  ?                ?  ?  ?  ?  ?  ?  ?  ?  ?  ?  ? ? ?Social Determinants of Health (SDOH) Interventions ?  ? ?Readmission Risk Interventions ?   ? View : No data to display.  ?  ?  ?  ? ? ?

## 2022-02-01 NOTE — Progress Notes (Signed)
Sabrina [name] ? ?MRN: [id number] ? ?DOB/AGE: Apr 12, 1946 76 y.o. ? ?Primary Care Physician:Sparks, Leonie Douglas, MD ? ?Admit date: 01/28/2022 ? ?Chief Complaint:  ?Chief Complaint  ?Patient presents with  ? Weakness  ? ? ?S-Pt presented on  01/28/2022 with  ?Chief Complaint  ?Patient presents with  ? Weakness  ?Marland Kitchen ?Patient seen today on first floor ?Patient offers no new specific physical complaint. ? ?Medications ? ? cephALEXin  250 mg Oral Q12H  ? enoxaparin (LOVENOX) injection  30 mg Subcutaneous Q24H  ? insulin aspart  0-6 Units Subcutaneous Q4H  ? pantoprazole  40 mg Oral Daily  ? polyethylene glycol  17 g Oral Daily  ? senna-docusate  1 tablet Oral BID  ? ? ? ? ? ? ? ?VHQ:IONGE from the symptoms mentioned above,there are no other symptoms referable to all systems reviewed. ? ?Physical Exam: ?Vital signs in last 24 hours: ?Temp:  [97.9 ?F (36.6 ?C)-98.9 ?F (37.2 ?C)] 98 ?F (36.7 ?C) (04/09 0745) ?Pulse Rate:  [95-108] 95 (04/09 0745) ?Resp:  [15-18] 16 (04/09 0745) ?BP: (103-140)/(65-77) 106/73 (04/09 0745) ?SpO2:  [99 %-100 %] 99 % (04/09 0745) ?Weight change:  ?Last BM Date : 02/01/22 ? ?Intake/Output from previous day: ?04/08 0701 - 04/09 0700 ?In: 700 [P.O.:700] ?Out: -  ?No intake/output data recorded. ? ? ?Physical Exam: ? ?General- pt is awake,alert, oriented to time place and person ? ?Resp- No acute REsp distress, CTA B/L NO Rhonchi ? ?CVS- S1S2 regular in rate and rhythm ? ?GIT- BS+, soft, Non tender , distended ? ?EXT- No LE Edema,  No Cyanosis ? ? ? ?Lab Results: ? ?CBC ? ?Recent Labs  ?  01/30/22 ?0416 01/31/22 ?9528  ?WBC 5.6 5.6  ?HGB 9.3* 9.4*  ?HCT 29.7* 30.0*  ?PLT 580* 394  ? ? ?BMET ? ?Recent Labs  ?  01/31/22 ?4132 02/01/22 ?0442  ?NA 138 140  ?K 3.4* 4.0  ?CL 102 106  ?CO2 24 25  ?GLUCOSE 154* 170*  ?BUN 25* 23  ?CREATININE 2.74* 2.45*  ?CALCIUM 9.1 9.1  ? ? ? ? ?Most recent Creatinine trend  ?Lab Results  ?Component Value Date  ? CREATININE 2.45 (H) 02/01/2022  ? CREATININE 2.74 (H) 01/31/2022  ?  CREATININE 2.74 (H) 01/30/2022  ?  ? ? ?MICRO ? ? ?Recent Results (from the past 240 hour(s))  ?Urine Culture     Status: Abnormal  ? Collection Time: 01/29/22  2:54 AM  ? Specimen: Urine, Random  ?Result Value Ref Range Status  ? Specimen Description   Final  ?  URINE, RANDOM ?Performed at [facility], [address], [place], [place] [address] ?  ? Special Requests   Final  ?  NONE ?Performed at [facility], [address], [place], [place] [address] ?  ? Culture >=100,000 COLONIES/mL CITROBACTER KOSERI (A)  Final  ? Report Status 01/31/2022 FINAL  Final  ? Organism ID, Bacteria CITROBACTER KOSERI (A)  Final  ?    Susceptibility  ? Citrobacter koseri - MIC*  ?  CEFAZOLIN <=4 SENSITIVE Sensitive   ?  CEFEPIME <=0.12 SENSITIVE Sensitive   ?  CEFTRIAXONE <=0.25 SENSITIVE Sensitive   ?  CIPROFLOXACIN <=0.25 SENSITIVE Sensitive   ?  GENTAMICIN <=1 SENSITIVE Sensitive   ?  IMIPENEM <=0.25 SENSITIVE Sensitive   ?  NITROFURANTOIN 32 SENSITIVE Sensitive   ?  TRIMETH/SULFA <=20 SENSITIVE Sensitive   ?  PIP/TAZO <=4 SENSITIVE Sensitive   ?  * >=100,000 COLONIES/mL CITROBACTER KOSERI  ?  Body fluid culture w Gram Stain     Status: None (Preliminary result)  ? Collection Time: 01/29/22  1:26 PM  ? Specimen: PATH Cytology Peritoneal fluid  ?Result Value Ref Range Status  ? Specimen Description   Final  ?  PERITONEAL ?Performed at [facility], [address] [place], [place] [address] ?  ? Special Requests   Final  ?  NONE ?Performed at [facility], [address], [place], [place] [address] ?  ? Gram Stain   Final  ?  FEW WBC PRESENT, PREDOMINANTLY MONONUCLEAR ?NO ORGANISMS SEEN ?  ? Culture   Final  ?  NO GROWTH 2 DAYS ?Performed at [facility], [place] [address], [place], [place] [address] ?  ? Report Status PENDING  Incomplete  ?  ? ? ? ? ? ?Impression: ? ?Patient is a 76 year old African-American female with a past medical history diabetes mellitus type 2, hyperlipidemia,  hypertension, recent admission with pulmonary embolism s/p thrombectomy 3 weeks ago, now on anticoagulation-Eliquis, history of CVA, history of postmenopausal bleeding who came to the ER with chief complaint of generalized weakness. ? ?Patient admitted with ?-Acute kidney injury ?-Urinary tract infection ?-Ascites ?-Stage IV adenocarcinoma of endometrium ?-Acute metabolic acidosis ?-Generalized weakness ? ?1)Renal   ? ?Acute kidney injury ?Patient has AKI secondary to ATN ?There is a possibility of hepatorenal syndrome ?Patient creatinine has increased from her baseline of 1.04--1.3 to now 3.07 ?Patient most likely has CKD ?Data in favor of CKD is that patient has had proteinuria ?Patient has CKD most likely from diabetes mellitus ?Patient UA shows protein going back to 2017 ?Patient's renal ultrasound was reviewed patient's renal ultrasound does not show any obstructive issues ?  ? ?Patient acute kidney injury is improving ?Patient creatinine is trending down ?Patient peak creatinine was around 3.0 and has come down to now nearly 2.5 ? ? ?2)HTN ? ?Blood pressure is stable  ? ? ?3)Anemia of chronic disease ? ? ?  Latest Ref Rng & Units 01/31/2022  ?  5:20 AM 01/30/2022  ?  4:16 AM 01/29/2022  ? 12:11 AM  ?CBC  ?WBC 4.0 - 10.5 K/uL 5.6   5.6   7.2    ?Hemoglobin 12.0 - 15.0 g/dL 9.4   9.3   9.7    ?Hematocrit 36.0 - 46.0 % 30.0   29.7   31.6    ?Platelets 150 - 400 K/uL 394   580   613    ?  ? ? ? HGb at goal (9--11) ? ? ?4) Hypophosphatemia ? ? ? ?Lab Results  ?Component Value Date  ? CALCIUM 9.1 02/01/2022  ? PHOS 2.2 (L) 01/31/2022  ? ? ?Will encourage oral intake ?We will hold off on starting patient on K-Phos ? ? ?5)Stage IV adenocarcinoma of endometrium ?Patient being followed by oncology and primary team ?  ? ? ?6) Electrolytes  ? ? ?  Latest Ref Rng & Units 02/01/2022  ?  4:42 AM 01/31/2022  ?  5:20 AM 01/30/2022  ?  4:16 AM  ?BMP  ?Glucose 70 - 99 mg/dL 170   154   141    ?BUN 8 - 23 mg/dL '23   25   27    '$ ?Creatinine  0.44 - 1.00 mg/dL 2.45   2.74   2.74    ?Sodium 135 - 145 mmol/L 140   138   137    ?Potassium 3.5 - 5.1 mmol/L 4.0   3.4  3.4    ?Chloride 98 - 111 mmol/L 106   102   101    ?CO2 22 - 32 mmol/L '25   24   22    '$ ?Calcium 8.9 - 10.3 mg/dL 9.1   9.1   8.9    ?  ? ?Sodium ?Normonatremic ? ? ?Potassium ?Hypokalemia ?Now better ? ? ? ?  ?7)Acute metabolic acidosis ?Patient had bicarb of 17 at the time of presentation ?Patient bicarb is now better ? ? ? ? ?Plan: ? ?Will encourage oral intake ?We will hold off on starting K-Phos ?Patient AKI is improving ?Patient hemoglobin is stable ?Patient bicarb is improving ?We will follow ? ? ? ? ? ?Tamme Mozingo s Theador Hawthorne ?02/01/2022, 8:03 AM  ?

## 2022-02-01 NOTE — Plan of Care (Signed)
  Problem: Health Behavior/Discharge Planning: Goal: Ability to manage health-related needs will improve Outcome: Progressing   

## 2022-02-01 NOTE — Progress Notes (Signed)
Physical Therapy Treatment ?Patient Details ?Name: Sabrina [name] ?MRN: [id number] ?DOB: 1946-03-01 ?Today's Date: 02/01/2022 ? ? ?History of Present Illness 76 y.o. female who was here ~4 week prior after passing out/Loss of Consciousness, had thrombectomy bilateral pulmonary emobli (3/16).  She is now sent by PCP with abnormal lab values, abdominal pain, acute kidney injury, and generalized weakness. PmHx: concern for endometrial cancer/mets, DM, HTN, HLD, CVA (4/21). ? ?  ?PT Comments  ? ? Pt ready for session.  Stood and is able to complete x 1 lap on unit with RW and min guard/supervision.  Stated daughter works from home and is home 24 hours a day to assist.  Of note, HR does increase to 160 during gait.  She is asked to return to room to rest but self directs care and does resist some and takes time to fix things in room to her liking before sitting despite cues/education.  RN aware.  Will secure chat MD. ? ?Pt demonstrated overall improved gait and tolerance today.  At this point she is appropriate to transition back to home with HHPT.  TOC aware. ?  ?Recommendations for follow up therapy are one component of a multi-disciplinary discharge planning process, led by the attending physician.  Recommendations may be updated based on patient status, additional functional criteria and insurance authorization. ? ?Follow Up Recommendations ? Home health PT ?  ?  ?Assistance Recommended at Discharge    ?Patient can return home with the following A little help with walking and/or transfers;A little help with bathing/dressing/bathroom;Assistance with cooking/housework;Assist for transportation;Help with stairs or ramp for entrance ?  ?Equipment Recommendations ? Rolling walker (2 wheels)  ?  ?Recommendations for Other Services   ? ? ?  ?Precautions / Restrictions Precautions ?Precautions: Fall ?Restrictions ?Weight Bearing Restrictions: No  ?  ? ?Mobility ? Bed Mobility ?  ?  ?  ?  ?  ?  ?  ?General bed mobility comments:  in recliner before and after ?  ? ?Transfers ?Overall transfer level: Needs assistance ?Equipment used: Rolling walker (2 wheels) ?Transfers: Sit to/from Stand ?Sit to Stand: Min guard ?  ?  ?  ?  ?  ?  ?  ? ?Ambulation/Gait ?Ambulation/Gait assistance: Min guard, Supervision ?Gait Distance (Feet): 200 Feet ?Assistive device: Rolling walker (2 wheels) ?Gait Pattern/deviations: Step-to pattern ?  ?  ?  ?General Gait Details: HR up to 160 with gait ? ? ?Stairs ?  ?  ?  ?  ?  ? ? ?Wheelchair Mobility ?  ? ?Modified Rankin (Stroke Patients Only) ?  ? ? ?  ?Balance Overall balance assessment: Needs assistance ?Sitting-balance support: Feet supported, No upper extremity supported ?Sitting balance-Leahy Scale: Good ?  ?  ?Standing balance support: Bilateral upper extremity supported, During functional activity, Reliant on assistive device for balance ?Standing balance-Leahy Scale: Fair ?Standing balance comment: reliant on walker for gati but able to walk short distances in room wihtout AD and no LOB ?  ?  ?  ?  ?  ?  ?  ?  ?  ?  ?  ?  ? ?  ?Cognition Arousal/Alertness: Awake/alert ?Behavior During Therapy: [facility] for tasks assessed/performed ?Overall Cognitive Status: Within Functional Limits for tasks assessed ?  ?  ?  ?  ?  ?  ?  ?  ?  ?  ?  ?  ?  ?  ?  ?  ?General Comments: self directs session.  lots of personality ?  ?  ? ?  ?  Exercises   ? ?  ?General Comments   ?  ?  ? ?Pertinent Vitals/Pain Pain Assessment ?Pain Assessment: No/denies pain  ? ? ?Home Living   ?  ?  ?  ?  ?  ?  ?  ?  ?  ?   ?  ?Prior Function    ?  ?  ?   ? ?PT Goals (current goals can now be found in the care plan section) Progress towards PT goals: Progressing toward goals ? ?  ?Frequency ? ? ? Min 2X/week ? ? ? ?  ?PT Plan Current plan remains appropriate  ? ? ?Co-evaluation   ?  ?  ?  ?  ? ?  ?AM-PAC PT "6 Clicks" Mobility   ?Outcome Measure ? Help needed turning from your back to your side while in a flat bed without using bedrails?: None ?Help  needed moving from lying on your back to sitting on the side of a flat bed without using bedrails?: None ?Help needed moving to and from a bed to a chair (including a wheelchair)?: A Little ?Help needed standing up from a chair using your arms (e.g., wheelchair or bedside chair)?: A Little ?Help needed to walk in hospital room?: A Little ?Help needed climbing 3-5 steps with a railing? : A Little ?6 Click Score: 20 ? ?  ?End of Session Equipment Utilized During Treatment: Gait belt ?Activity Tolerance: Patient tolerated treatment well ?Patient left: in chair;with call bell/phone within reach;with family/visitor present;with chair alarm set ?Nurse Communication: Mobility status ?PT Visit Diagnosis: Unsteadiness on feet (R26.81);Muscle weakness (generalized) (M62.81);History of falling (Z91.81) ?  ? ? ?Time: 5170-0174 ?PT Time Calculation (min) (ACUTE ONLY): 13 min ? ?Charges:  $Gait Training: 8-22 mins          ?         Chesley Noon, PTA ?02/01/22, 1:52 PM ? ?

## 2022-02-01 NOTE — Progress Notes (Signed)
Pt requesting that IV be removed, educated pt on need for IV, despite education pt refuses IV and refuses the start of another IV in a different location, Dr Arbutus Ped made aware, ok to leave IV out per pt request ?

## 2022-02-01 NOTE — Progress Notes (Signed)
?Progress Note ? ? ?Patient: Sabrina [name] XHB:[id number] DOB: 14-Oct-1946 DOA: 01/28/2022     3 ?DOS: the patient was seen and examined on 02/01/2022 ?  ?Brief hospital course: ?Sabrina [name] is a 76 y.o. female with medical history significant for type 2 diabetes, hyperlipidemia, hypertension, recently diagnosed saddle PE status post thrombectomy 3 weeks ago, now on Eliquis, CVA, postmenopausal bleeding with concern for endometrial cancer who presented to [facility] ED at the recommendation of her primary care provider due to abnormal labs with elevated creatinine.  Patient reported generalized weakness since discharge 3 weeks ago, abdominal pain and distention. ? ?ED evaluation consistent with AKI, creatinine 3.07, BUN 32 with anion gap metabolic acidosis.  Vitals were stable and otherwise labs unremarkable. ? ?Assessment and Plan: ?* AKI (acute kidney injury) ([place]) ?Cr on admission 2.70>>3.07>>2.74 (x2 d) >> 2.45 today. ?Baseline appears ~ 1.0 ?Renal U/S normal. ?Monitor urine output. ?Hypervolemic, will avoid fluids, encourage PO hydration. ?Avoid nephrotoxins, hypotension. ?Renally dose meds. ?Nephrology consulted. ? ?UTI (urinary tract infection) ?Started on empiric Rocephin Rocephin pending urine culture.  Urine culture growing Citrobacter koseri, pansensitive. ?-- Stop Rocephin ?-- Keflex 2 more days to complete course ? ?Ascites ?Secondary to metastatic endometrial cancer. Status post 2.4 L fluid removed by paracentesis on 4/6. ?--Monitor for re-accumulation ? ?Thrombocytosis ?Likely reactive in setting of malignancy. ?Follow CBC. ? ?Type 2 diabetes mellitus with hyperlipidemia ([place]) ?Uncontrolled.  A1c 8.8% on 01/05/2022. ?Sensitive insulin sliding scale Novolog. ? ?GERD (gastroesophageal reflux disease) ?On PPI ? ?Stage IV adenocarcinoma of endometrium ([place]) ?Biopsy had just been obtained outpatient, pathology pending.  Peritoneal fluid cytology obtained this admission is preliminary but reflects  malignancy. ?--Oncology and Palliative Care consulted ? ?Metabolic acidosis, increased anion gap ?POA due to renal failure. ?Improved. ?Monitor BMP. ? ?Generalized weakness ?PT/OT recommend SNF. ?TOC following.   ?Fall precautions. ?Up out of bed at least daily. ?Mobilize frequently as tolerated. ? ?Bipolar affective disorder, manic, severe, with psychotic behavior ([place]) ?Not on any psych meds per med history. ?Psychiatry consulted to determine capacity for decision making at the request of Oncology. ?--Pt does have capacity ?--No acute issues or suicidality ? ? ? ? ?  ? ?Subjective: Patient awake resting in bed, sister was at bedside when seen on rounds this morning.  Patient is in good spirits, she and sister laughing about sister triggering the bed alarm on the chair.  Patient denies any acute complaints including any abdominal pain.  Reports urinating well.  No fevers chills or other acute complaints.  No acute events reported.  Patient expressed some discontent about being moved to a different room last night.   ? ?Physical Exam: ?Vitals:  ? 01/31/22 2134 02/01/22 0528 02/01/22 0745 02/01/22 1152  ?BP: 136/72 103/65 106/73 132/69  ?Pulse: 100 97 95 (!) 109  ?Resp: '18 15 16 16  '$ ?Temp: 98.5 ?F (36.9 ?C) 97.9 ?F (36.6 ?C) 98 ?F (36.7 ?C) 97.7 ?F (36.5 ?C)  ?TempSrc: Oral     ?SpO2: 99% 100% 99% 99%  ?Weight:      ?Height:      ? ?General exam: awake, alert, no acute distress ?HEENT: moist mucus membranes, hearing grossly normal  ?Respiratory system: normal respiratory effort.  On room air. ?Cardiovascular system: RRR, no pedal edema.   ?Gastrointestinal system: soft, nontender, nondistended (but slightly slightly more so than the first day after paracentesis). ?Central nervous system: A&O x4. no gross focal neurologic deficits, normal speech ?Extremities: moves all, no edema, normal tone ?Skin: dry,  intact, normal temperature ?Psychiatry: normal mood, congruent affect, judgement and insight appear normal ? ? ?Data  Reviewed: ? ?Labs reviewed and notable for creatinine improved to 2.45, glucose 170 ? ?Family Communication: Sister [name] at bedside on rounds ? ?Disposition: ?Status is: Inpatient ?Remains inpatient appropriate because: Ongoing acute renal failure warranting additional monitoring.  Discharge pending clearance by nephrology.  May require SNF placement for rehab. ? ? Planned Discharge Destination: Skilled nursing facility versus home with home health ? ? ? ?Time spent: 35 minutes ? ?Author: ?Ezekiel Slocumb, DO ?02/01/2022 2:22 PM ? ?For on call review [email or web address].  ?

## 2022-02-02 DIAGNOSIS — I2602 Saddle embolus of pulmonary artery with acute cor pulmonale: Secondary | ICD-10-CM | POA: Diagnosis not present

## 2022-02-02 DIAGNOSIS — E1169 Type 2 diabetes mellitus with other specified complication: Secondary | ICD-10-CM | POA: Diagnosis not present

## 2022-02-02 DIAGNOSIS — F03918 Unspecified dementia, unspecified severity, with other behavioral disturbance: Secondary | ICD-10-CM | POA: Diagnosis not present

## 2022-02-02 DIAGNOSIS — E113212 Type 2 diabetes mellitus with mild nonproliferative diabetic retinopathy with macular edema, left eye: Secondary | ICD-10-CM | POA: Diagnosis not present

## 2022-02-02 DIAGNOSIS — M6281 Muscle weakness (generalized): Secondary | ICD-10-CM | POA: Diagnosis not present

## 2022-02-02 DIAGNOSIS — N179 Acute kidney failure, unspecified: Secondary | ICD-10-CM | POA: Diagnosis not present

## 2022-02-02 DIAGNOSIS — I10 Essential (primary) hypertension: Secondary | ICD-10-CM | POA: Diagnosis not present

## 2022-02-02 DIAGNOSIS — E1165 Type 2 diabetes mellitus with hyperglycemia: Secondary | ICD-10-CM | POA: Diagnosis not present

## 2022-02-02 LAB — BASIC METABOLIC PANEL
Anion gap: 13 (ref 5–15)
BUN: 22 mg/dL (ref 8–23)
CO2: 24 mmol/L (ref 22–32)
Calcium: 9.2 mg/dL (ref 8.9–10.3)
Chloride: 101 mmol/L (ref 98–111)
Creatinine, Ser: 2.14 mg/dL — ABNORMAL HIGH (ref 0.44–1.00)
GFR, Estimated: 24 mL/min — ABNORMAL LOW (ref >60)
Glucose, Bld: 182 mg/dL — ABNORMAL HIGH (ref 70–99)
Potassium: 3.6 mmol/L (ref 3.5–5.1)
Sodium: 138 mmol/L (ref 135–145)

## 2022-02-02 LAB — GLUCOSE, CAPILLARY
Glucose-Capillary: 158 mg/dL — ABNORMAL HIGH (ref 70–99)
Glucose-Capillary: 178 mg/dL — ABNORMAL HIGH (ref 70–99)
Glucose-Capillary: 172 mg/dL — ABNORMAL HIGH (ref 70–99)
Glucose-Capillary: 101 mg/dL — ABNORMAL HIGH (ref 70–99)

## 2022-02-02 LAB — PTH, INTACT AND CALCIUM
Calcium, Total (PTH): 9.3 mg/dL (ref 8.7–10.3)
PTH: 33 pg/mL (ref 15–65)

## 2022-02-02 LAB — BODY FLUID CULTURE W GRAM STAIN: Culture: NO GROWTH

## 2022-02-02 LAB — CYTOLOGY - NON PAP

## 2022-02-02 LAB — SURGICAL PATHOLOGY

## 2022-02-02 MED ORDER — METOPROLOL TARTRATE 25 MG PO TABS
12.5000 mg | ORAL_TABLET | Freq: Two times a day (BID) | ORAL | Status: DC
  Administered 2022-02-02 – 2022-02-03 (×2): 12.5 mg via ORAL
  Filled 2022-02-02: qty 1

## 2022-02-02 NOTE — Progress Notes (Signed)
Occupational Therapy Treatment ?Patient Details ?Name: Sabrina [name] ?MRN: [id number] ?DOB: 1945/10/29 ?Today's Date: 02/02/2022 ? ? ?History of present illness 76 y.o. female who was here ~4 week prior after passing out/Loss of Consciousness, had thrombectomy bilateral pulmonary emobli (3/16).  She is now sent by PCP with abnormal lab values, abdominal pain, acute kidney injury, and generalized weakness. PmHx: concern for endometrial cancer/mets, DM, HTN, HLD, CVA (4/21). ?  ?OT comments ? Pt seen for OT tx this date. Family member present and pt agreeable to toileting and mobility. Pt required SBA for transfer from recliner with RW and SBA-CGA to ambulate to the bathroom with RW around obstacles in the room. Pt transferred to std height toilet with [place] of R grab bar and SBA. Pt required MIN A for doffing and initiating donning of brief over feet and otherwise was able to complete clothing mgt and pericare with set up and supv-SBA. Pt washed her hands at the sink with SBA-CGA. Pt HR up to 165 with toileting and required cues to sit and terminate additional exertional activity due to high heartrate. Down to 130 once back in bed. RNs notified. Pt left in bed with all needs in reach, family present. Pt demo's decreased awareness of deficits and safety during session. Very self directive during session. Pt continues to benefit from skilled OT services.   ? ?Recommendations for follow up therapy are one component of a multi-disciplinary discharge planning process, led by the attending physician.  Recommendations may be updated based on patient status, additional functional criteria and insurance authorization. ?   ?Follow Up Recommendations ? Home health OT  ?  ?Assistance Recommended at Discharge Frequent or constant Supervision/Assistance  ?Patient can return home with the following ? A little help with walking and/or transfers;A little help with bathing/dressing/bathroom;Assistance with cooking/housework;Assist for  transportation;Help with stairs or ramp for entrance;Direct supervision/assist for medications management ?  ?Equipment Recommendations ? BSC/3in1  ?  ?Recommendations for Other Services   ? ?  ?Precautions / Restrictions Precautions ?Precautions: Fall ?Precaution Comments: watch HR! ?Restrictions ?Weight Bearing Restrictions: No  ? ? ?  ? ?Mobility Bed Mobility ?Overal bed mobility: Needs Assistance ?Bed Mobility: Sit to Supine ?  ?  ?  ?Sit to supine: Min assist ?  ?General bed mobility comments: MIN A for RLE back to bed ?  ? ?Transfers ?Overall transfer level: Needs assistance ?Equipment used: Rolling walker (2 wheels) ?Transfers: Sit to/from Stand ?Sit to Stand: Min guard ?  ?  ?  ?  ?  ?  ?  ?  ?Balance Overall balance assessment: Needs assistance ?Sitting-balance support: Feet supported, No upper extremity supported ?Sitting balance-Leahy Scale: Good ?  ?  ?Standing balance support: No upper extremity supported, During functional activity, Reliant on assistive device for balance, Single extremity supported ?Standing balance-Leahy Scale: Fair ?Standing balance comment: reliant on RW for mobility, able to stand at sink with fair balance for grooming tasks ?  ?  ?  ?  ?  ?  ?  ?  ?  ?  ?  ?   ? ?ADL either performed or assessed with clinical judgement  ? ?ADL Overall ADL's : Needs assistance/impaired ?  ?  ?Grooming: Standing;Supervision/safety;Wash/dry hands ?  ?  ?  ?  ?  ?  ?  ?  ?  ?Toilet Transfer: Regular Toilet;Rolling walker (2 wheels);Min guard;Ambulation;Grab bars ?  ?Toileting- Clothing Manipulation and Hygiene: Set up;Supervision/safety;Sitting/lateral lean;Sit to/from stand;Minimal assistance ?Toileting - Clothing Manipulation Details (indicate cue  type and reason): MIN A to doff and initiate donning of new brief over feet, otherwise set up/supv ?  ?  ?Functional mobility during ADLs: Min guard;Rolling walker (2 wheels);Supervision/safety ?  ?  ? ?Extremity/Trunk Assessment   ?  ?  ?  ?  ?   ? ?Vision   ?  ?  ?Perception   ?  ?Praxis   ?  ? ?Cognition Arousal/Alertness: Awake/alert ?Behavior During Therapy: [facility] for tasks assessed/performed ?Overall Cognitive Status: Within Functional Limits for tasks assessed ?  ?  ?  ?  ?  ?  ?  ?  ?  ?  ?  ?  ?  ?  ?  ?  ?General Comments: very self directive, easily angered, decr safety awareness and awareness of deficits ?  ?  ?   ?Exercises   ? ?  ?Shoulder Instructions   ? ? ?  ?General Comments Pt HR up to 165 with toileting and required cues to sit and terminate additional exertional activity due to high heartrate. Down to 130 once back in bed. RNs notified.  ? ? ?Pertinent Vitals/ Pain       Pain Assessment ?Pain Assessment: 0-10 ?Pain Score: 8  ?Pain Location: lower abdominal pain ?Pain Descriptors / Indicators: Aching ?Pain Intervention(s): Limited activity within patient's tolerance, Monitored during session, Repositioned ? ?Home Living   ?  ?  ?  ?  ?  ?  ?  ?  ?  ?  ?  ?  ?  ?  ?  ?  ?  ?  ? ?  ?Prior Functioning/Environment    ?  ?  ?  ?   ? ?Frequency ? Min 2X/week  ? ? ? ? ?  ?Progress Toward Goals ? ?OT Goals(current goals can now be found in the care plan section) ? Progress towards OT goals: Progressing toward goals ? ?Acute Rehab OT Goals ?Patient Stated Goal: have less pain and be as independent as possible taking care of myself ?OT Goal Formulation: With patient ?Time For Goal Achievement: 02/13/22 ?Potential to Achieve Goals: Good  ?Plan Discharge plan remains appropriate;Frequency remains appropriate   ? ?Co-evaluation ? ? ?   ?  ?  ?  ?  ? ?  ?AM-PAC OT "6 Clicks" Daily Activity     ?Outcome Measure ? ? Help from another person eating meals?: None ?Help from another person taking care of personal grooming?: A Little ?Help from another person toileting, which includes using toliet, bedpan, or urinal?: A Little ?Help from another person bathing (including washing, rinsing, drying)?: A Little ?Help from another person to put on and taking off  regular upper body clothing?: A Little ?Help from another person to put on and taking off regular lower body clothing?: A Little ?6 Click Score: 19 ? ?  ?End of Session Equipment Utilized During Treatment: Gait belt;Rolling walker (2 wheels) ? ?OT Visit Diagnosis: Other abnormalities of gait and mobility (R26.89);Muscle weakness (generalized) (M62.81);History of falling (Z91.81) ?  ?Activity Tolerance Patient tolerated treatment well ?  ?Patient Left in bed;with call bell/phone within reach;with bed alarm set;with family/visitor present ?  ?Nurse Communication Mobility status (HR) ?  ? ?   ? ?Time: 4174-0814 ?OT Time Calculation (min): 27 min ? ?Charges: OT General Charges ?$OT Visit: 1 Visit ?OT Treatments ?$Self Care/Home Management : 23-37 mins ? ?Ardeth Perfect., MPH, MS, OTR/L ?ascom [phone] ?02/02/22, 4:04 PM ?

## 2022-02-02 NOTE — Progress Notes (Signed)
Sabrina [name] ? ?MRN: [id number] ? ?DOB/AGE: 07-09-1946 76 y.o. ? ?Primary Care Physician:Sparks, Leonie Douglas, MD ? ?Admit date: 01/28/2022 ? ?Chief Complaint:  ?Chief Complaint  ?Patient presents with  ? Weakness  ? ?Update: ?Patient seen sitting up in chair, alert and oriented to person and place ?Currently on room air ?Denies nausea and vomiting ?Frustrated with care and treatment plan. ?Reports lack of bowel movement in 4 days. ? ? ? ? ?S-Pt presented on  01/28/2022 with  ?Chief Complaint  ?Patient presents with  ? Weakness  ?Marland Kitchen ?Patient seen today on first floor ?Patient offers no new specific physical complaint. ? ?Medications ? ? cephALEXin  250 mg Oral Q12H  ? enoxaparin (LOVENOX) injection  30 mg Subcutaneous Q24H  ? insulin aspart  0-6 Units Subcutaneous TID WC  ? pantoprazole  40 mg Oral Daily  ? polyethylene glycol  17 g Oral Daily  ? senna-docusate  1 tablet Oral BID  ? ? ? ? ? ? ? ?BHA:LPFXT from the symptoms mentioned above,there are no other symptoms referable to all systems reviewed. ? ?Physical Exam: ?Vital signs in last 24 hours: ?Temp:  [98 ?F (36.7 ?C)-98.3 ?F (36.8 ?C)] 98 ?F (36.7 ?C) (04/10 0730) ?Pulse Rate:  [97-108] 105 (04/10 0730) ?Resp:  [16-18] 18 (04/10 0730) ?BP: (127-156)/(70-109) 127/77 (04/10 0800) ?SpO2:  [100 %] 100 % (04/10 0730) ?Weight:  [59.2 kg] 59.2 kg (04/10 0500) ?Weight change:  ?Last BM Date : 02/01/22 ? ?Intake/Output from previous day: ?04/09 0701 - 04/10 0700 ?In: 3 [P.O.:660] ?Out: -  ?No intake/output data recorded. ? ? ?Physical Exam: ? ?General- pt is awake,alert, oriented to place and person ? ?Resp- No acute REsp distress, CTA B/L NO Rhonchi ? ?CVS- S1S2 regular in rate and rhythm ? ?GIT- BS+, soft, Non tender , distended ? ?EXT- No LE Edema,  No Cyanosis ? ? ? ?Lab Results: ? ?CBC ? ?Recent Labs  ?  01/31/22 ?0240  ?WBC 5.6  ?HGB 9.4*  ?HCT 30.0*  ?PLT 394  ? ? ? ?BMET ? ?Recent Labs  ?  02/01/22 ?0442 02/02/22 ?0904  ?NA 140 138  ?K 4.0 3.6  ?CL 106 101  ?CO2 25  24  ?GLUCOSE 170* 182*  ?BUN 23 22  ?CREATININE 2.45* 2.14*  ?CALCIUM 9.1 9.2  ? ? ? ? ? ?Most recent Creatinine trend  ?Lab Results  ?Component Value Date  ? CREATININE 2.14 (H) 02/02/2022  ? CREATININE 2.45 (H) 02/01/2022  ? CREATININE 2.74 (H) 01/31/2022  ? ?  ? ? ?MICRO ? ? ?Recent Results (from the past 240 hour(s))  ?Urine Culture     Status: Abnormal  ? Collection Time: 01/29/22  2:54 AM  ? Specimen: Urine, Random  ?Result Value Ref Range Status  ? Specimen Description   Final  ?  URINE, RANDOM ?Performed at [facility], [address] [place], [place] [address] ?  ? Special Requests   Final  ?  NONE ?Performed at [facility], [address], [place], [place] [address] ?  ? Culture >=100,000 COLONIES/mL CITROBACTER KOSERI (A)  Final  ? Report Status 01/31/2022 FINAL  Final  ? Organism ID, Bacteria CITROBACTER KOSERI (A)  Final  ?    Susceptibility  ? Citrobacter koseri - MIC*  ?  CEFAZOLIN <=4 SENSITIVE Sensitive   ?  CEFEPIME <=0.12 SENSITIVE Sensitive   ?  CEFTRIAXONE <=0.25 SENSITIVE Sensitive   ?  CIPROFLOXACIN <=0.25 SENSITIVE Sensitive   ?  GENTAMICIN <=1  SENSITIVE Sensitive   ?  IMIPENEM <=0.25 SENSITIVE Sensitive   ?  NITROFURANTOIN 32 SENSITIVE Sensitive   ?  TRIMETH/SULFA <=20 SENSITIVE Sensitive   ?  PIP/TAZO <=4 SENSITIVE Sensitive   ?  * >=100,000 COLONIES/mL CITROBACTER KOSERI  ?Body fluid culture w Gram Stain     Status: None  ? Collection Time: 01/29/22  1:26 PM  ? Specimen: PATH Cytology Peritoneal fluid  ?Result Value Ref Range Status  ? Specimen Description   Final  ?  PERITONEAL ?Performed at [facility], [address] [place], [place] [address] ?  ? Special Requests   Final  ?  NONE ?Performed at [facility], [address], [place], [place] [address] ?  ? Gram Stain   Final  ?  FEW WBC PRESENT, PREDOMINANTLY MONONUCLEAR ?NO ORGANISMS SEEN ?  ? Culture   Final  ?  NO GROWTH 3 DAYS ?Performed at [facility], [place] [address],  [place], [place] [address] ?  ? Report Status 02/02/2022 FINAL  Final  ?  ? ? ? ? ? ?Impression: ? ?Patient is a 76 year old African-American female with a past medical history diabetes mellitus type 2, hyperlipidemia, hypertension, recent admission with pulmonary embolism s/p thrombectomy 3 weeks ago, now on anticoagulation-Eliquis, history of CVA, history of postmenopausal bleeding who came to the ER with chief complaint of generalized weakness. ? ?Patient admitted with ?-Acute kidney injury ?-Urinary tract infection ?-Ascites ?-Stage IV adenocarcinoma of endometrium ?-Acute metabolic acidosis ?-Generalized weakness ? ?1)Renal   ? ?Acute kidney injury ?Patient has AKI secondary to ATN ?There is a possibility of hepatorenal syndrome ?Patient creatinine has increased from her baseline of 1.04--1.3 to now 3.07 ?Patient most likely has CKD ?Data in favor of CKD is that patient has had proteinuria ?Patient has CKD most likely from diabetes mellitus ?Patient UA shows protein going back to 2017 ?Patient's renal ultrasound was reviewed patient's renal ultrasound does not show any obstructive issues ?  ?Renal function continues to improve.  4 urine occurrences recorded in previous 24 hours.  Patient cleared to discharge from renal stance.  We will schedule follow-up appointment in our office at discharge to continue to follow. ? ? ?2)HTN.  Patient prescribed spironolactone and furosemide outpatient. ? Current blood pressure 127/77 ? ? ? ?3)Anemia of chronic disease ? ? ?  Latest Ref Rng & Units 01/31/2022  ?  5:20 AM 01/30/2022  ?  4:16 AM 01/29/2022  ? 12:11 AM  ?CBC  ?WBC 4.0 - 10.5 K/uL 5.6   5.6   7.2    ?Hemoglobin 12.0 - 15.0 g/dL 9.4   9.3   9.7    ?Hematocrit 36.0 - 46.0 % 30.0   29.7   31.6    ?Platelets 150 - 400 K/uL 394   580   613    ?  ?HGb at goal (9--11) ?Hemoglobin within goal, 9.4 ? ?4) Hypophosphatemia ? ?Lab Results  ?Component Value Date  ? CALCIUM 9.2 02/02/2022  ? PHOS 2.2 (L) 01/31/2022  ?Calcium within  acceptable range, phosphorus decreased ?Patient encouraged to increase oral intake. ? ? ?5)Stage IV adenocarcinoma of endometrium ?Patient being followed by oncology and primary team ? ?  ?6)Acute metabolic acidosis ?Patient had bicarb of 17 at the time of presentation ? Resolved ? ? ? ?[place] ?02/02/2022, 11:53 AM  ?

## 2022-02-02 NOTE — Plan of Care (Signed)
  Problem: Clinical Measurements: Goal: Ability to maintain clinical measurements within normal limits will improve Outcome: Progressing   

## 2022-02-02 NOTE — Progress Notes (Signed)
Pt states that lips burning from acidity of pineapple that she ate on tray, asked her if she would like a cool wet rag for her lips, refused and told me to get out ?

## 2022-02-02 NOTE — Progress Notes (Signed)
?Progress Note ? ? ?Patient: Sabrina [name] OEV:[id number] DOB: 1946-03-25 DOA: 01/28/2022     4 ?DOS: the patient was seen and examined on 02/02/2022 ?  ?Brief hospital course: ?Sabrina [name] is a 76 y.o. female with medical history significant for type 2 diabetes, hyperlipidemia, hypertension, recently diagnosed saddle PE status post thrombectomy 3 weeks ago, now on Eliquis, CVA, postmenopausal bleeding with concern for endometrial cancer who presented to [facility] ED at the recommendation of her primary care provider due to abnormal labs with elevated creatinine.  Patient reported generalized weakness since discharge 3 weeks ago, abdominal pain and distention. ? ?ED evaluation consistent with AKI, creatinine 3.07, BUN 32 with anion gap metabolic acidosis.  Vitals were stable and otherwise labs unremarkable. ? ?Assessment and Plan: ?* AKI (acute kidney injury) ([place]) ?Cr on admission 2.70>>3.07>>2.74 (x2 d) >> 2.45 >> 2.14today. ?Baseline appears ~ 1.0 ?Renal U/S normal. ?Monitor urine output. ?Hypervolemic, will avoid fluids, encourage PO hydration. ?Avoid nephrotoxins, hypotension. ?Renally dose meds. ?Nephrology consulted. ? ?UTI (urinary tract infection) ?Started on empiric Rocephin Rocephin pending urine culture.  Urine culture growing Citrobacter koseri, pansensitive. ?-- Stop Rocephin ?-- Keflex 2 more days to complete course ? ?Ascites ?Secondary to metastatic endometrial cancer. Status post 2.4 L fluid removed by paracentesis on 4/6. ?--Monitor for re-accumulation ? ?Thrombocytosis ?Likely reactive in setting of malignancy. ?Follow CBC. ? ?Type 2 diabetes mellitus with hyperlipidemia ([place]) ?Uncontrolled.  A1c 8.8% on 01/05/2022. ?Sensitive insulin sliding scale Novolog. ? ?GERD (gastroesophageal reflux disease) ?On PPI ? ?Stage IV adenocarcinoma of endometrium ([place]) ?Biopsy had just been obtained outpatient, pathology pending.  Peritoneal fluid cytology obtained this admission confirms high-grade serous carcinoma   ?--Oncology and Palliative Care consulted ?-- Chemotherapy would be the recommendation if patient wishes to pursue treatment ?-- Patient has stated multiple times that she would not want chemotherapy. ?-- Goals of care discussions ? ?Metabolic acidosis, increased anion gap ?POA due to renal failure. ?Improved. ?Monitor BMP. ? ?Dementia with aggressive behavior ([place]) ?Appears stable.  No aggressive behaviors reported this admission thus far. ? ?Questionable diagnosis of dementia as patient has crystal-clear memory of the events of prior hospital admissions in the details of this admission as well.  She demonstrates a clear grasp on her medical conditions as well.  She does not demonstrate cognitive impairment seen in patients with dementia during this admission or when I have taken care of her previously. ? ?Generalized weakness ?PT/OT recommend SNF. ?TOC following.   ?Fall precautions. ?Up out of bed at least daily. ?Mobilize frequently as tolerated. ? ?Bipolar affective disorder, manic, severe, with psychotic behavior ([place]) ?Not on any psych meds per med history. ?Psychiatry consulted to determine capacity for decision making at the request of Oncology. ?--Pt does have capacity ?--No acute issues or suicidality ? ?Patient demonstrates labile mood.  At times very pleasant and cooperative, other times refusing to talk or even allow staff to enter the room. ? ? ? ? ?  ? ?Subjective: This morning on rounds, patient would not allow me to enter her room.  I knocked and stayed at the entry, patient was in her recliner with lunch tray.  I asked permission to enter, she shook her head no but would not speak to me.  She then called the nurses desk and requested that they come and make me leave.  No acute events reported overnight patient appears comfortable and in no distress. ? ?Physical Exam: ?Vitals:  ? 02/02/22 0603 02/02/22 0730 02/02/22 0800 02/02/22 1237  ?  BP: 134/70 (!) 156/109 127/77 135/74  ?Pulse: 97 (!) 105  (!)  109  ?Resp: '16 18  17  '$ ?Temp: 98.1 ?F (36.7 ?C) 98 ?F (36.7 ?C)  98 ?F (36.7 ?C)  ?TempSrc: Oral Oral    ?SpO2: 100% 100%  100%  ?Weight:      ?Height:      ? ?Exam limited to inspection from the doorway of patient's room as she would not allow me to enter. ? ?General exam: awake, alert, no acute distress ?HEENT: hearing grossly normal  ?Respiratory system: normal respiratory effort, on room air. ?Cardiovascular system: Unable to evaluate ?Gastrointestinal system: Unable to evaluate ?Central nervous system: Alert, demonstrated full orientation based on listening to her speak to the nurse secretary ?Extremities: moves all otherwise unable to evaluate ?Skin: Unable to evaluate ?Psychiatry: Labile mood, congruent affect, judgement and insight appear normal ? ? ? ?Data Reviewed: ? ?  Labs reviewed notable for creatinine further improved to 2.14 today, glucose 182, GFR 24 ? ?Family Communication: None at bedside during today's encounter.  Patient's sister was at bedside on rounds yesterday. ? ?Disposition: ?Status is: Inpatient ?Remains inpatient appropriate because: Awaiting SNF placement for short-term rehab.  Otherwise medically stable for discharge. ? ? Planned Discharge Destination: Skilled nursing facility ? ? ? ?Time spent: 35 minutes including time spent on rounds and in coordination of care with consultants and ancillary staff. ? ?Author: ?Sabrina Slocumb, DO ?02/02/2022 4:04 PM ? ?For on call review [email or web address].  ?

## 2022-02-02 NOTE — Assessment & Plan Note (Signed)
Appears stable.  No aggressive behaviors reported this admission thus far. ? ?Questionable diagnosis of dementia as patient has crystal-clear memory of the events of prior hospital admissions in the details of this admission as well.  She demonstrates a clear grasp on her medical conditions as well.  She does not demonstrate cognitive impairment seen in patients with dementia during this admission or when I have taken care of her previously. ?

## 2022-02-02 NOTE — TOC Progression Note (Signed)
Transition of Care (TOC) - Progression Note  ? ? ?Patient Details  ?Name: Sabrina [name] ?MRN: [id number] ?Date of Birth: [birth year] ? ?Transition of Care (TOC) CM/SW Contact  ?Debara Kamphuis A Branden Vine, LCSW ?Phone Number: ?02/02/2022, 10:09 AM ? ?Clinical Narrative:  Per Isaias Cowman, [name] is pending.  ? ? ? ?Expected Discharge Plan: [place] ?Barriers to Discharge: Ship broker, Continued Medical Work up ? ?Expected Discharge Plan and Services ?Expected Discharge Plan: [place] ?  ?  ?  ?  ?                ?  ?  ?  ?  ?  ?  ?  ?  ?  ?  ? ? ?Social Determinants of Health (SDOH) Interventions ?  ? ?Readmission Risk Interventions ?   ? View : No data to display.  ?  ?  ?  ? ? ?

## 2022-02-02 NOTE — Care Management Important Message (Signed)
Important Message ? ?Patient Details  ?Name: Sabrina [name] ?MRN: [id number] ?Date of Birth: 05-22-1946 ? ? ?Medicare Important Message Given:  Yes ? ? ? ? ?Juliann Pulse A Dennise Bamber ?02/02/2022, 3:17 PM ?

## 2022-02-02 NOTE — Progress Notes (Signed)
Physical Therapy Treatment ?Patient Details ?Name: Sabrina [name] ?MRN: [id number] ?DOB: [birth year] ?Today's Date: 02/02/2022 ? ? ?History of Present Illness 76 y.o. female who was here ~4 week prior after passing out/Loss of Consciousness, had thrombectomy bilateral pulmonary emobli (3/16).  She is now sent by PCP with abnormal lab values, abdominal pain, acute kidney injury, and generalized weakness. PmHx: concern for endometrial cancer/mets, DM, HTN, HLD, CVA (4/21). ? ?  ?PT Comments  ? ? Pt received in Semi-Fowler's position and somewhat agreeable to therapy.  Pt demanding and difficult to have conversation with at times.  Pt perseverates on board, noting 8/10 pain, with +1 assistance, etc.  Pt declined therapy multiple times, but wanted to ambulate.  Pt does perform bed mobility slow, with verbalization of each sequence she is going to take prior to performing.  Pt is able to get into/out of bed almost independently, needing slight assistance for R LE due to pain.  Pt able to ambulate around the nursing station x2 with standing rest breaks at times.  HR did get to 172 with gait and nursing notified.  Pt then transferred back to bed where HR dropped back to 130's.  Current discharge plans to home with HHPT remain appropriate at this time.  Pt will continue to benefit from skilled therapy in order to address deficits listed below. ?  ?Recommendations for follow up therapy are one component of a multi-disciplinary discharge planning process, led by the attending physician.  Recommendations may be updated based on patient status, additional functional criteria and insurance authorization. ? ?Follow Up Recommendations ? Home health PT ?  ?  ?Assistance Recommended at Discharge    ?Patient can return home with the following A little help with walking and/or transfers;A little help with bathing/dressing/bathroom;Assistance with cooking/housework;Assist for transportation;Help with stairs or ramp for entrance ?   ?Equipment Recommendations ? Rolling walker (2 wheels)  ?  ?Recommendations for Other Services   ? ? ?  ?Precautions / Restrictions Precautions ?Precautions: Fall ?Precaution Comments: watch HR! ?Restrictions ?Weight Bearing Restrictions: No  ?  ? ?Mobility ? Bed Mobility ?Overal bed mobility: Needs Assistance ?Bed Mobility: Sit to Supine ?  ?  ?  ?Sit to supine: Min assist ?  ?General bed mobility comments: MIN A for RLE back to bed ?  ? ?Transfers ?Overall transfer level: Needs assistance ?Equipment used: Rolling walker (2 wheels) ?Transfers: Sit to/from Stand ?Sit to Stand: Min guard ?  ?  ?  ?  ?  ?  ?  ? ?Ambulation/Gait ?Ambulation/Gait assistance: Min guard, Supervision ?Gait Distance (Feet): 320 Feet ?Assistive device: Rolling walker (2 wheels) ?Gait Pattern/deviations: Step-to pattern, Step-through pattern ?  ?  ?  ?General Gait Details: HR up to 172 with gait ? ? ?Stairs ?  ?  ?  ?  ?  ? ? ?Wheelchair Mobility ?  ? ?Modified Rankin (Stroke Patients Only) ?  ? ? ?  ?Balance Overall balance assessment: Needs assistance ?Sitting-balance support: Feet supported, No upper extremity supported ?Sitting balance-Leahy Scale: Good ?  ?  ?Standing balance support: No upper extremity supported, During functional activity, Reliant on assistive device for balance, Single extremity supported ?Standing balance-Leahy Scale: Fair ?Standing balance comment: reliant on RW for mobility, able to stand at sink with fair balance for grooming tasks ?  ?  ?  ?  ?  ?  ?  ?  ?  ?  ?  ?  ? ?  ?Cognition Arousal/Alertness: Awake/alert ?Behavior During Therapy: [facility]  for tasks assessed/performed ?Overall Cognitive Status: Within Functional Limits for tasks assessed ?  ?  ?  ?  ?  ?  ?  ?  ?  ?  ?  ?  ?  ?  ?  ?  ?General Comments: very self directive, easily angered, decr safety awareness and awareness of deficits ?  ?  ? ?  ?Exercises   ? ?  ?General Comments General comments (skin integrity, edema, etc.): Pt HR up to 165 with toileting  and required cues to sit and terminate additional exertional activity due to high heartrate. Down to 130 once back in bed. RNs notified. ?  ?  ? ?Pertinent Vitals/Pain Pain Assessment ?Pain Assessment: 0-10 ?Pain Score: 8  ?Pain Location: lower abdominal pain ?Pain Descriptors / Indicators: Aching ?Pain Intervention(s): Limited activity within patient's tolerance, Monitored during session, Repositioned  ? ? ?Home Living   ?  ?  ?  ?  ?  ?  ?  ?  ?  ?   ?  ?Prior Function    ?  ?  ?   ? ?PT Goals (current goals can now be found in the care plan section) Acute Rehab PT Goals ?Patient Stated Goal: Get stronger at rehab before going home ?PT Goal Formulation: With patient ?Time For Goal Achievement: 02/12/22 ?Potential to Achieve Goals: Good ?Progress towards PT goals: Progressing toward goals ? ?  ?Frequency ? ? ? Min 2X/week ? ? ? ?  ?PT Plan Current plan remains appropriate  ? ? ?Co-evaluation   ?  ?  ?  ?  ? ?  ?AM-PAC PT "6 Clicks" Mobility   ?Outcome Measure ? Help needed turning from your back to your side while in a flat bed without using bedrails?: None ?Help needed moving from lying on your back to sitting on the side of a flat bed without using bedrails?: None ?Help needed moving to and from a bed to a chair (including a wheelchair)?: A Little ?Help needed standing up from a chair using your arms (e.g., wheelchair or bedside chair)?: A Little ?Help needed to walk in hospital room?: A Little ?Help needed climbing 3-5 steps with a railing? : A Little ?6 Click Score: 20 ? ?  ?End of Session Equipment Utilized During Treatment: Gait belt ?Activity Tolerance: Patient tolerated treatment well ?Patient left: in chair;with call bell/phone within reach;with family/visitor present;with chair alarm set ?Nurse Communication: Mobility status ?PT Visit Diagnosis: Unsteadiness on feet (R26.81);Muscle weakness (generalized) (M62.81);History of falling (Z91.81) ?  ? ? ?Time: 9983-3825 ?PT Time Calculation (min) (ACUTE ONLY):  33 min ? ?Charges:  $Gait Training: 23-37 mins          ?          ? ?Gwenlyn Saran, PT, DPT ?02/02/22, 6:07 PM ? ? ? ?Christie Nottingham ?02/02/2022, 6:04 PM ? ?
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
## 2022-02-02 NOTE — Consult Note (Signed)
[facility] CM Inpatient Consult ? ? ?02/02/2022 ? ?Sabrina [name] ?07/24/1946 ?[id number] ? ?[facility] CM) ?  ?Patient was reviewed for less than 30 days unplanned readmission. Assessed for post hospital chronic disease management needs.  ? ?Per review, current recommendation is for SNF. No [facility] CM needs at this time as needs will be met at SNF level of care. ? ?Of note, [facility] Care Management services does not replace or interfere with any services that are arranged by inpatient case management or social work.  ? ?Netta Cedars, MSN, RN ?[facility] Liaison ?Phone [phone] ?Toll free office [phone]  ?

## 2022-02-03 ENCOUNTER — Other Ambulatory Visit: Payer: Self-pay | Admitting: Hospice and Palliative Medicine

## 2022-02-03 ENCOUNTER — Other Ambulatory Visit: Payer: Self-pay

## 2022-02-03 DIAGNOSIS — E1169 Type 2 diabetes mellitus with other specified complication: Secondary | ICD-10-CM

## 2022-02-03 DIAGNOSIS — N950 Postmenopausal bleeding: Secondary | ICD-10-CM

## 2022-02-03 LAB — BASIC METABOLIC PANEL
Anion gap: 12 (ref 5–15)
BUN: 20 mg/dL (ref 8–23)
CO2: 24 mmol/L (ref 22–32)
Calcium: 9 mg/dL (ref 8.9–10.3)
Chloride: 103 mmol/L (ref 98–111)
Creatinine, Ser: 1.87 mg/dL — ABNORMAL HIGH (ref 0.44–1.00)
GFR, Estimated: 28 mL/min — ABNORMAL LOW (ref >60)
Glucose, Bld: 136 mg/dL — ABNORMAL HIGH (ref 70–99)
Potassium: 3.7 mmol/L (ref 3.5–5.1)
Sodium: 139 mmol/L (ref 135–145)

## 2022-02-03 LAB — GLUCOSE, CAPILLARY
Glucose-Capillary: 133 mg/dL — ABNORMAL HIGH (ref 70–99)
Glucose-Capillary: 160 mg/dL — ABNORMAL HIGH (ref 70–99)

## 2022-02-03 MED ORDER — APIXABAN 5 MG PO TABS: ORAL_TABLET | ORAL | 0 refills | Status: DC

## 2022-02-03 NOTE — Progress Notes (Signed)
Security came bedside and spoke with the pt. After he left, she continued to push the call bell. She is asking for upper management to be called.  ?

## 2022-02-03 NOTE — Progress Notes (Signed)
Pt given AVS.  Nurse Tech and Security at [facility] for walk out to care to sister.  Sister to take home/  Spoke to daughter and confirmed discharge, aware pt was coming home.  Pt left with DMS in hand.  DMS placed in back of car.   ?

## 2022-02-03 NOTE — Discharge Summary (Signed)
?Physician Discharge Summary ?  ?Patient: Sabrina [name] MRN: [id number] DOB: [birth year]  ?Admit date:     01/28/2022  ?Discharge date: 02/16/22  ?Discharge Physician: Ezekiel Slocumb  ? ?PCP: [name], MD  ? ?Recommendations at discharge:  ? ? Palliative Care to follow up after discharge ?Follow up with oncology to discuss potential treatment options for stage IV endometrial cancer ?Follow up with PCP in 1 week ?Repeat BMP, CBC in 1 week ?Monitor for recurrence of malignant ascites. Consider palliative peritoneal drain if indicated to avoid serial paracenteses which patient declines ?Patient would benefit from mood stabilizing therapy.  Recommend psychiatry referral. ? ?Discharge Diagnoses: ?Principal Problem: ?  AKI (acute kidney injury) ([place]) ?Active Problems: ?  Bipolar affective disorder, manic, severe, with psychotic behavior ([place]) ?  Ascites ?  Type 2 diabetes mellitus with hyperlipidemia ([place]) ?  Acute saddle pulmonary embolism (H[facility]) ?  Serous carcinoma of body of uterus ([place]) ?  GERD (gastroesophageal reflux disease) ?  Generalized weakness ?  Metabolic acidosis, increased anion gap ? ?Resolved Problems: ?  Hepatorenal syndrome ([place]) ?  AKI (acute kidney injury) ([place]) ? ?Hospital Course: ?Sabrina [name] is a 76 y.o. female with medical history significant for type 2 diabetes, hyperlipidemia, hypertension, recently diagnosed saddle PE status post thrombectomy 3 weeks ago, now on Eliquis, CVA, postmenopausal bleeding with concern for endometrial cancer who presented to [facility] ED at the recommendation of her primary care provider due to abnormal labs with elevated creatinine.  Patient reported generalized weakness since discharge 3 weeks ago, abdominal pain and distention. ? ?ED evaluation consistent with AKI, creatinine 3.07, BUN 32 with anion gap metabolic acidosis.  Vitals were stable and otherwise labs unremarkable. ? ?Assessment and Plan: ?* AKI (acute kidney injury) ([name]) ?Cr on admission  2.70>>3.07>>2.74 (x2 d) >> 2.45 >> 2.14 >> 1.87 today. ?Baseline appears ~ 1.0 ?Renal U/S normal. ?Monitor urine output. ?Hypervolemic, will avoid fluids, encourage PO hydration. ?Avoid nephrotoxins, hypotension. ?Renally dose meds. ?Nephrology consulted - follow up. ? ?Ascites ?Secondary to metastatic endometrial cancer. Status post 2.4 L fluid removed by paracentesis on 4/6.  Appears slowly re-accumulating but not rapidly. ?Monitor closely.  Paracentesis as needed, if pt agreeable. ? ?Bipolar affective disorder, manic, severe, with psychotic behavior ([place]) ?Not on any psych meds per med history. ?Psychiatry consulted to determine capacity for decision making at the request of Oncology. ?--Pt does have capacity ?--No acute issues or suicidality ? ?Patient demonstrates very labile mood.  At times very pleasant and cooperative, other times refusing to talk or even allow staff to enter the room.  Speech content often nonsensical, difficult to redirect. ?--Recommend psychiatry referral ?--Consider mood stabilizing medication ? ?Type 2 diabetes mellitus with hyperlipidemia ([place]) ?Uncontrolled.  A1c 8.8% on 01/05/2022. ?Sensitive insulin sliding scale Novolog. ? ?Acute saddle pulmonary embolism ([place]) ?Thrombectomy during prior admission 3/13-3/19.  Discharged on Eliquis. ?Family reported non-compliance. ?--Continue Eliquis ? ?Serous carcinoma of body of uterus ([place]) ?Biopsy had just been obtained outpatient, pathology pending.  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 ?-- Goals of care discussions ongoing ? ?GERD (gastroesophageal reflux disease) ?On PPI ? ?Metabolic acidosis, increased anion gap ?POA due to renal failure. ?Improved. ?Monitor BMP. ? ?Generalized weakness ?PT/OT  recommend HOME HEALTH. ?TOC following.   ?Fall precautions. ?Up out of  bed at least daily. ?Mobilize frequently as tolerated. ? ?Patient got up from bed and ambulated to and from restroom independently without any assistance this morning during my encounter.   ? ? ? ? ?  ? ? ?Consultants: Oncology, Nephrology, Palliative Care ?Procedures performed: Paracentesis  ?Disposition: Home health ?Diet recommendation:  ?Cardiac and Carb modified diet ?DISCHARGE MEDICATION: ?Allergies as of 02/03/2022   ? ?   Reactions  ? Glimepiride Diarrhea, Other (See Comments)  ? Glipizide Other (See Comments)  ? Lisinopril Other (See Comments)  ? headache  ? Losartan Other (See Comments)  ? Headache  ? Norco [hydrocodone-acetaminophen] Other (See Comments)  ? Mood changes  ? Pioglitazone Swelling  ? On legs  ? Penicillins Diarrhea, Rash  ? ?  ? ?  ?Medication List  ?  ? ?STOP taking these medications   ? ?insulin detemir 100 UNIT/ML injection ?Commonly known as: LEVEMIR ?  ? ?  ? ?TAKE these medications   ? ?Accu-Chek Softclix Lancets lancets ?  ?Accu-Chek Softclix Lancets lancets ?Use to check blood sugar twice a day as directed. ?  ?apixaban 5 MG Tabs tablet ?Commonly known as: ELIQUIS ?Take one tablet (5 mg) by mouth twice daily ?What changed: additional instructions ?  ?ondansetron 4 MG disintegrating tablet ?Commonly known as: ZOFRAN-ODT ?Take by mouth. ?  ?pantoprazole 40 MG tablet ?Commonly known as: PROTONIX ?Take 1 tablet (40 mg total) by mouth daily. ?  ?polyethylene glycol 17 g packet ?Commonly known as: MIRALAX / GLYCOLAX ?Take 17 g by mouth daily as needed for moderate constipation. ?  ?propranolol ER 60 MG 24 hr capsule ?Commonly known as: INDERAL LA ?Take 60 mg by mouth daily. ?  ? ?  ? ? Contact information for follow-up providers   ? ? [name], MD. Schedule an appointment as soon as possible for a visit in 1 week(s).   ?Specialty: Internal Medicine ?Why: hospital follow up ?Contact information: ?[address] ?[place] [address] ?[phone] ? ? ?  ?  ? ? Earlie Server, MD. Go to.   ?Specialty: Oncology ?Contact information: ?[place] [address] ?[phone] ? ? ?  ?  ? ?  ?  ? ? Contact information for after-discharge care   ? ? Destination   ? ? [facility] .   ?Service: Skilled Nursing ?Contact information: ?[place] [facility] ?[place] ?[phone] ? ?  ?  ? ?  ?  ? ?  ?  ? ?  ? ?Discharge Exam: ?Filed Weights  ? 02/02/22 0500 02/02/22 2330 02/03/22 0558  ?Weight: 59.2 kg 62.9 kg 58 kg  ? ?General exam: awake, alert, no acute distress ?HEENT: moist mucus membranes, hearing grossly normal  ?Respiratory system: CTAB, no wheezes, rales or rhonchi, normal respiratory effort. ?Cardiovascular system: normal S1/S2, RRR, no JVD, murmurs, rubs, gallops,  no pedal edema.   ?Gastrointestinal system: soft, NT, ND, no HSM felt, +bowel sounds. ?Central nervous system: A&O x4. no gross focal neurologic deficits, normal speech ?Extremities: moves all, no edema, normal tone ?Skin: dry, intact, normal temp ?Psychiatry: normal mood, pleasant this AM, congruent affect, judgement and insight appear normal ? ? ?Condition at discharge: stable ? ?The results of significant diagnostics from this hospitalization (including imaging, microbiology, ancillary and laboratory) are listed below for reference.  ? ?Imaging Studies: ?CT ABDOMEN PELVIS WO CONTRAST ? ?Result Date: 01/29/2022 ?CLINICAL DATA:  Weakness and abdominal swelling. Increasing constipation. Last CT  was abnormal concerning for uterine neoplasm, unknown what follow-up may have been done following the CT reading. There is note made of a dilatation and curettage of the uterus in the patient's surgical history but the date is unknown. Most recently the patient underwent mechanical pulmonary arterial thrombectomy on 01/08/2022. EXAM: CT ABDOMEN AND PELVIS WITHOUT CONTRAST TECHNIQUE:  Multidetector CT imaging of the abdomen and pelvis was performed following the standard protocol without IV contrast. RADIATION DOSE REDUCTION: This exam was performed according to the departmental dose-optimization progr

## 2022-02-03 NOTE — Assessment & Plan Note (Addendum)
Thrombectomy during prior admission 3/13-3/19.  Discharged on Eliquis. ?Family reported non-compliance. ?--Continue Eliquis ?

## 2022-02-03 NOTE — Progress Notes (Signed)
Sabrina [name] ? ?MRN: [id number] ? ?DOB/AGE: 1946-07-06 76 y.o. ? ?Primary Care Physician:Sparks, Leonie Douglas, MD ? ?Admit date: 01/28/2022 ? ?Chief Complaint:  ?Chief Complaint  ?Patient presents with  ? Weakness  ? ?Update: ?Patient seen laying in bed, alert and agitated ?Claims staff is telling her truth and requesting witnesses to all conversations. ?Patient hyper focused on frustrations with care ?States she has not had a bowel movement ? ? ? ?S-Pt presented on  01/28/2022 with  ?Chief Complaint  ?Patient presents with  ? Weakness  ?. ? ?Medications ? ? enoxaparin (LOVENOX) injection  30 mg Subcutaneous Q24H  ? insulin aspart  0-6 Units Subcutaneous TID WC  ? metoprolol tartrate  12.5 mg Oral BID  ? pantoprazole  40 mg Oral Daily  ? polyethylene glycol  17 g Oral Daily  ? senna-docusate  1 tablet Oral BID  ? ? ?NWG:NFAOZ from the symptoms mentioned above,there are no other symptoms referable to all systems reviewed. ? ?Physical Exam: ?Vital signs in last 24 hours: ?Temp:  [98.1 ?F (36.7 ?C)-100.1 ?F (37.8 ?C)] 98.2 ?F (36.8 ?C) (04/11 3086) ?Pulse Rate:  [92-122] 96 (04/11 0811) ?Resp:  [16-18] 16 (04/11 5784) ?BP: (124-147)/(64-77) 147/65 (04/11 6962) ?SpO2:  [99 %-100 %] 100 % (04/11 0811) ?Weight:  [58 kg-62.9 kg] 58 kg (04/11 0558) ?Weight change: 3.7 kg ?Last BM Date : 02/01/22 ? ?Intake/Output from previous day: ?No intake/output data recorded. ?No intake/output data recorded. ? ? ?Physical Exam: ? ?General- pt is awake,alert, oriented to place and person, agitated ? ?Resp- No acute REsp distress, CTA B/L NO Rhonchi ? ?CVS- S1S2 regular in rate and rhythm ? ?GIT- BS+, soft, Non tender , distended ? ?EXT- No LE Edema,  No Cyanosis ? ? ? ?Lab Results: ? ?CBC ? ?No results for input(s): WBC, HGB, HCT, PLT in the last 72 hours. ? ? ?BMET ? ?Recent Labs  ?  02/02/22 ?0904 02/03/22 ?0448  ?NA 138 139  ?K 3.6 3.7  ?CL 101 103  ?CO2 24 24  ?GLUCOSE 182* 136*  ?BUN 22 20  ?CREATININE 2.14* 1.87*  ?CALCIUM 9.2 9.0   ? ? ? ? ? ?Most recent Creatinine trend  ?Lab Results  ?Component Value Date  ? CREATININE 1.87 (H) 02/03/2022  ? CREATININE 2.14 (H) 02/02/2022  ? CREATININE 2.45 (H) 02/01/2022  ? ?  ? ? ?MICRO ? ? ?Recent Results (from the past 240 hour(s))  ?Urine Culture     Status: Abnormal  ? Collection Time: 01/29/22  2:54 AM  ? Specimen: Urine, Random  ?Result Value Ref Range Status  ? Specimen Description   Final  ?  URINE, RANDOM ?Performed at [facility], [address], [place], [place] [address] ?  ? Special Requests   Final  ?  NONE ?Performed at [facility], [address], [place],  [address] ?  ? Culture >=100,000 COLONIES/mL CITROBACTER KOSERI (A)  Final  ? Report Status 01/31/2022 FINAL  Final  ? Organism ID, Bacteria CITROBACTER KOSERI (A)  Final  ?    Susceptibility  ? Citrobacter koseri - MIC*  ?  CEFAZOLIN <=4 SENSITIVE Sensitive   ?  CEFEPIME <=0.12 SENSITIVE Sensitive   ?  CEFTRIAXONE <=0.25 SENSITIVE Sensitive   ?  CIPROFLOXACIN <=0.25 SENSITIVE Sensitive   ?  GENTAMICIN <=1 SENSITIVE Sensitive   ?  IMIPENEM <=0.25 SENSITIVE Sensitive   ?  NITROFURANTOIN 32 SENSITIVE Sensitive   ?  TRIMETH/SULFA <=20 SENSITIVE Sensitive   ?  PIP/TAZO <=4 SENSITIVE Sensitive   ?  * >=100,000 COLONIES/mL CITROBACTER KOSERI  ?Body fluid culture w Gram Stain     Status: None  ? Collection Time: 01/29/22  1:26 PM  ? Specimen: PATH Cytology Peritoneal fluid  ?Result Value Ref Range Status  ? Specimen Description   Final  ?  PERITONEAL ?Performed at [facility], [address], [place], [place] [address] ?  ? Special Requests   Final  ?  NONE ?Performed at [facility], [address] [place], [place] [address] ?  ? Gram Stain   Final  ?  FEW WBC PRESENT, PREDOMINANTLY MONONUCLEAR ?NO ORGANISMS SEEN ?  ? Culture   Final  ?  NO GROWTH 3 DAYS ?Performed at [facility], [place] [address], [place], [place] [address] ?  ? Report Status 02/02/2022 FINAL  Final  ?   ? ? ? ? ? ?Impression: ? ?Patient is a 76 year old African-American female with a past medical history diabetes mellitus type 2, hyperlipidemia, hypertension, recent admission with pulmonary embolism s/p thrombectomy 3 weeks ago, now on anticoagulation-Eliquis, history of CVA, history of postmenopausal bleeding who came to the ER with chief complaint of generalized weakness. ? ?Patient admitted with ?-Acute kidney injury ?-Urinary tract infection ?-Ascites ?-Stage IV adenocarcinoma of endometrium ?-Acute metabolic acidosis ?-Generalized weakness ? ?1)Renal   ? ?Acute kidney injury ?Patient has AKI secondary to ATN ?There is a possibility of hepatorenal syndrome ?Patient creatinine has increased from her baseline of 1.04--1.3 to now 3.07 ?Patient most likely has CKD ?Data in favor of CKD is that patient has had proteinuria ?Patient has CKD most likely from diabetes mellitus ?Patient UA shows protein going back to 2017 ?Patient's renal ultrasound was reviewed patient's renal ultrasound does not show any obstructive issues ?  ?Renal function continues to improve. Will schedule follow up appt at our office at discharge. ? ? ?2)HTN.  Patient prescribed spironolactone and furosemide outpatient. ? Current blood pressure 147/65 ? ? ?3)Anemia of chronic disease ? ? ?  Latest Ref Rng & Units 01/31/2022  ?  5:20 AM 01/30/2022  ?  4:16 AM 01/29/2022  ? 12:11 AM  ?CBC  ?WBC 4.0 - 10.5 K/uL 5.6   5.6   7.2    ?Hemoglobin 12.0 - 15.0 g/dL 9.4   9.3   9.7    ?Hematocrit 36.0 - 46.0 % 30.0   29.7   31.6    ?Platelets 150 - 400 K/uL 394   580   613    ?  ?HGb at goal (9--11) ? ? ?4) Hypophosphatemia ? ?Lab Results  ?Component Value Date  ? PTH 33 02/01/2022  ? PTH Comment 02/01/2022  ? CALCIUM 9.0 02/03/2022  ? PHOS 2.2 (L) 01/31/2022  ? ?Will continue to monitor bone minerals during this admission ? ? ?5)Stage IV adenocarcinoma of endometrium ?Patient being followed by oncology and primary team ? ?  ?6)Acute metabolic acidosis ?Patient  had bicarb of 17 at the time of presentation ? Corrected ? ? ? ?[place] ?02/03/2022, 1:55 PM  ?

## 2022-02-03 NOTE — TOC Progression Note (Signed)
Transition of Care (TOC) - Progression Note  ? ? ?Patient Details  ?Name: Sabrina [name] ?MRN: [id number] ?Date of Birth: 12-Jun-1946 ? ?Transition of Care (TOC) CM/SW Contact  ?Pete Pelt, RN ?Phone Number: ?02/03/2022, 12:00 PM ? ?Clinical Narrative:   Patient with [facility] in to see patient today.  BSC and rolling walker requested through adapt, [place].  Patient to discharge this afternooon. ? ? ? ?Expected Discharge Plan: [place] ?Barriers to Discharge: Ship broker, Continued Medical Work up ? ?Expected Discharge Plan and Services ?Expected Discharge Plan: [place] ?  ?  ?  ?  ?Expected Discharge Date: 02/03/22               ?  ?  ?  ?  ?  ?  ?  ?  ?  ?  ? ? ?Social Determinants of Health (SDOH) Interventions ?  ? ?Readmission Risk Interventions ?   ? View : No data to display.  ?  ?  ?  ? ? ?
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
## 2022-02-04 ENCOUNTER — Emergency Department: Payer: Medicare HMO

## 2022-02-04 ENCOUNTER — Inpatient Hospital Stay
Admission: EM | Admit: 2022-02-04 | Discharge: 2022-02-10 | DRG: 682 | Disposition: A | Discharge status: Home Health | Payer: Medicare HMO | Attending: Family Medicine | Admitting: Family Medicine

## 2022-02-04 ENCOUNTER — Other Ambulatory Visit: Payer: Self-pay

## 2022-02-04 DIAGNOSIS — Z880 Allergy status to penicillin: Secondary | ICD-10-CM

## 2022-02-04 DIAGNOSIS — F0393 Unspecified dementia, unspecified severity, with mood disturbance: Secondary | ICD-10-CM | POA: Diagnosis not present

## 2022-02-04 DIAGNOSIS — I10 Essential (primary) hypertension: Secondary | ICD-10-CM | POA: Diagnosis present

## 2022-02-04 DIAGNOSIS — Z8673 Personal history of transient ischemic attack (TIA), and cerebral infarction without residual deficits: Secondary | ICD-10-CM | POA: Diagnosis not present

## 2022-02-04 DIAGNOSIS — Z7901 Long term (current) use of anticoagulants: Secondary | ICD-10-CM

## 2022-02-04 DIAGNOSIS — Z885 Allergy status to narcotic agent status: Secondary | ICD-10-CM | POA: Diagnosis not present

## 2022-02-04 DIAGNOSIS — Z20822 Contact with and (suspected) exposure to covid-19: Secondary | ICD-10-CM | POA: Diagnosis not present

## 2022-02-04 DIAGNOSIS — E1169 Type 2 diabetes mellitus with other specified complication: Secondary | ICD-10-CM

## 2022-02-04 DIAGNOSIS — Z7984 Long term (current) use of oral hypoglycemic drugs: Secondary | ICD-10-CM | POA: Diagnosis not present

## 2022-02-04 DIAGNOSIS — Z86711 Personal history of pulmonary embolism: Secondary | ICD-10-CM

## 2022-02-04 DIAGNOSIS — R4182 Altered mental status, unspecified: Principal | ICD-10-CM

## 2022-02-04 DIAGNOSIS — C541 Malignant neoplasm of endometrium: Secondary | ICD-10-CM | POA: Diagnosis not present

## 2022-02-04 DIAGNOSIS — I2510 Atherosclerotic heart disease of native coronary artery without angina pectoris: Secondary | ICD-10-CM | POA: Diagnosis present

## 2022-02-04 DIAGNOSIS — C549 Malignant neoplasm of corpus uteri, unspecified: Secondary | ICD-10-CM | POA: Diagnosis present

## 2022-02-04 DIAGNOSIS — K219 Gastro-esophageal reflux disease without esophagitis: Secondary | ICD-10-CM | POA: Diagnosis present

## 2022-02-04 DIAGNOSIS — F312 Bipolar disorder, current episode manic severe with psychotic features: Secondary | ICD-10-CM | POA: Diagnosis not present

## 2022-02-04 DIAGNOSIS — Z888 Allergy status to other drugs, medicaments and biological substances status: Secondary | ICD-10-CM

## 2022-02-04 DIAGNOSIS — F0392 Unspecified dementia, unspecified severity, with psychotic disturbance: Secondary | ICD-10-CM | POA: Diagnosis not present

## 2022-02-04 DIAGNOSIS — F03911 Unspecified dementia, unspecified severity, with agitation: Secondary | ICD-10-CM | POA: Diagnosis present

## 2022-02-04 DIAGNOSIS — Z79899 Other long term (current) drug therapy: Secondary | ICD-10-CM

## 2022-02-04 DIAGNOSIS — I2692 Saddle embolus of pulmonary artery without acute cor pulmonale: Secondary | ICD-10-CM | POA: Diagnosis not present

## 2022-02-04 DIAGNOSIS — E119 Type 2 diabetes mellitus without complications: Secondary | ICD-10-CM | POA: Diagnosis not present

## 2022-02-04 DIAGNOSIS — M6281 Muscle weakness (generalized): Secondary | ICD-10-CM | POA: Diagnosis not present

## 2022-02-04 DIAGNOSIS — E7800 Pure hypercholesterolemia, unspecified: Secondary | ICD-10-CM | POA: Diagnosis present

## 2022-02-04 DIAGNOSIS — R5381 Other malaise: Secondary | ICD-10-CM

## 2022-02-04 DIAGNOSIS — N179 Acute kidney failure, unspecified: Principal | ICD-10-CM | POA: Diagnosis present

## 2022-02-04 DIAGNOSIS — Z87440 Personal history of urinary (tract) infections: Secondary | ICD-10-CM

## 2022-02-04 LAB — URINE DRUG SCREEN, QUALITATIVE (ARMC ONLY)
Amphetamines, Ur Screen: NOT DETECTED
Barbiturates, Ur Screen: NOT DETECTED
Benzodiazepine, Ur Scrn: NOT DETECTED
Cannabinoid 50 Ng, Ur ~~LOC~~: NOT DETECTED
Cocaine Metabolite,Ur ~~LOC~~: NOT DETECTED
MDMA (Ecstasy)Ur Screen: NOT DETECTED
Methadone Scn, Ur: NOT DETECTED
Opiate, Ur Screen: NOT DETECTED
Phencyclidine (PCP) Ur S: NOT DETECTED
Tricyclic, Ur Screen: NOT DETECTED

## 2022-02-04 LAB — CBC WITH DIFFERENTIAL/PLATELET
Abs Immature Granulocytes: 0.03 10*3/uL (ref 0.00–0.07)
Basophils Absolute: 0 10*3/uL (ref 0.0–0.1)
Basophils Relative: 0 %
Eosinophils Absolute: 0 10*3/uL (ref 0.0–0.5)
Eosinophils Relative: 1 %
HCT: 30.4 % — ABNORMAL LOW (ref 36.0–46.0)
Hemoglobin: 9.4 g/dL — ABNORMAL LOW (ref 12.0–15.0)
Immature Granulocytes: 1 %
Lymphocytes Relative: 18 %
Lymphs Abs: 0.9 10*3/uL (ref 0.7–4.0)
MCH: 27.2 pg (ref 26.0–34.0)
MCHC: 30.9 g/dL (ref 30.0–36.0)
MCV: 88.1 fL (ref 80.0–100.0)
Monocytes Absolute: 0.4 10*3/uL (ref 0.1–1.0)
Monocytes Relative: 8 %
Neutro Abs: 3.7 10*3/uL (ref 1.7–7.7)
Neutrophils Relative %: 72 %
Platelets: 503 10*3/uL — ABNORMAL HIGH (ref 150–400)
RBC: 3.45 MIL/uL — ABNORMAL LOW (ref 3.87–5.11)
RDW: 14.5 % (ref 11.5–15.5)
WBC: 5.1 10*3/uL (ref 4.0–10.5)
nRBC: 0 % (ref 0.0–0.2)

## 2022-02-04 LAB — COMPREHENSIVE METABOLIC PANEL
ALT: 14 U/L (ref 0–44)
AST: 28 U/L (ref 15–41)
Albumin: 3 g/dL — ABNORMAL LOW (ref 3.5–5.0)
Alkaline Phosphatase: 55 U/L (ref 38–126)
Anion gap: 11 (ref 5–15)
BUN: 25 mg/dL — ABNORMAL HIGH (ref 8–23)
CO2: 21 mmol/L — ABNORMAL LOW (ref 22–32)
Calcium: 8.9 mg/dL (ref 8.9–10.3)
Chloride: 102 mmol/L (ref 98–111)
Creatinine, Ser: 1.96 mg/dL — ABNORMAL HIGH (ref 0.44–1.00)
GFR, Estimated: 26 mL/min — ABNORMAL LOW (ref >60)
Glucose, Bld: 118 mg/dL — ABNORMAL HIGH (ref 70–99)
Potassium: 4 mmol/L (ref 3.5–5.1)
Sodium: 134 mmol/L — ABNORMAL LOW (ref 135–145)
Total Bilirubin: 1 mg/dL (ref 0.3–1.2)
Total Protein: 6.7 g/dL (ref 6.5–8.1)

## 2022-02-04 LAB — URINALYSIS, COMPLETE (UACMP) WITH MICROSCOPIC
Bilirubin Urine: NEGATIVE
Glucose, UA: 50 mg/dL — AB
Ketones, ur: 5 mg/dL — AB
Leukocytes,Ua: NEGATIVE
Nitrite: NEGATIVE
Protein, ur: 100 mg/dL — AB
Specific Gravity, Urine: 1.025 (ref 1.005–1.030)
pH: 5 (ref 5.0–8.0)

## 2022-02-04 LAB — ETHANOL: Alcohol, Ethyl (B): <10 mg/dL (ref <10)

## 2022-02-04 LAB — RESP PANEL BY RT-PCR (FLU A&B, COVID) ARPGX2
Influenza A by PCR: NEGATIVE
Influenza B by PCR: NEGATIVE
SARS Coronavirus 2 by RT PCR: NEGATIVE

## 2022-02-04 LAB — TROPONIN I (HIGH SENSITIVITY): Troponin I (High Sensitivity): 8 ng/L (ref <18)

## 2022-02-04 MED ORDER — HALOPERIDOL LACTATE 5 MG/ML IJ SOLN
5.0000 mg | Freq: Once | INTRAMUSCULAR | Status: AC
Start: 2022-02-04 — End: 2022-02-04
  Administered 2022-02-04: 5 mg via INTRAMUSCULAR
  Filled 2022-02-04: qty 1

## 2022-02-04 MED ORDER — MIDAZOLAM HCL 2 MG/2ML IJ SOLN
2.0000 mg | Freq: Once | INTRAMUSCULAR | Status: AC
  Administered 2022-02-04: 2 mg via INTRAVENOUS
  Filled 2022-02-04: qty 2

## 2022-02-04 NOTE — ED Provider Notes (Signed)
? ?[facility] ?Provider Note ? ? ? Event Date/Time  ? First MD Initiated Contact with Patient 02/04/22 1414   ?  (approximate) ? ? ?History  ? ?Psychiatric Evaluation ? ? ?HPI ? ?Sabrina [name] is a 76 y.o. female  female with medical history significant for type 2 diabetes, hyperlipidemia, hypertension, recently diagnosed saddle PE status post thrombectomy 3 weeks ago, now on Eliquis, CVA, postmenopausal bleeding with concern for endometrial cancer recent hospitalization with discharge home yesterday for management of an AKI who presents from home by daughter with concerns for worsening altered mental status and aggressiveness and confusion.  Per daughter she has some dementia and underlying bipolar disorder but is not currently being treated for this.  She notes the patient is on Eliquis for PE and never followed up with regards to her endometrial cancer and so is not being treated for this as well.  He states the patient was intermittently aggressive and confused in the hospital but went she was discharged and seemed to be coherent but has been gotten more confused and aggressive since getting home.  She feels that the patient is not currently safe.  Emergency room when I asked the patient why she is here she states "because on my left eye".  She is intermittently yelling at the examiner and shouting nonsensical things.  At one point she sounds "due to cold".  Does endorse some pain but then is unable to clarify where.  She denies any SI or HI.  When asked about other review of systems she states "do you" to almost every question.  Per daughter no recent falls that she is aware of.  Daughter also does not believe she has been in alcohol or illegal drugs. ? ?  ? ? ?Physical Exam  ?Triage Vital Signs: ?ED Triage Vitals  ?Enc Vitals Group  ?   BP 02/04/22 1409 136/77  ?   Pulse Rate 02/04/22 1409 78  ?   Resp 02/04/22 1409 20  ?   Temp 02/04/22 1409 98.9 ?F (37.2 ?C)  ?   Temp Source 02/04/22  1409 Oral  ?   SpO2 02/04/22 1409 100 %  ?   Weight 02/04/22 1410 127 lb (57.6 kg)  ?   Height --   ?   Head Circumference --   ?   Peak Flow --   ?   Pain Score 02/04/22 1410 4  ?   Pain Loc --   ?   Pain Edu? --   ?   Excl. in [place]? --   ? ? ?Most recent vital signs: ?Vitals:  ? 02/04/22 1409 02/04/22 1545  ?BP: 136/77 111/62  ?Pulse: 78 96  ?Resp: 20 14  ?Temp: 98.9 ?F (37.2 ?C)   ?SpO2: 100% 100%  ? ? ?General: Awake, no distress.  ?CV:  Good peripheral perfusion.  ?Resp:  Normal effort.  ?Abd:  No distention.  ?Other:  Patient seems to be moving all extremities spontaneously.  Her speech is clear but she is intermittently nonsensical and yelling at this examiner.  She is refusing this examiner to touch her or examine her. ? ? ?ED Results / Procedures / Treatments  ?Labs ?(all labs ordered are listed, but only abnormal results are displayed) ?Labs Reviewed  ?RESP PANEL BY RT-PCR (FLU A&B, COVID) ARPGX2  ?COMPREHENSIVE METABOLIC PANEL  ?ETHANOL  ?URINE DRUG SCREEN, QUALITATIVE ([facility] ONLY)  ?CBC WITH DIFFERENTIAL/PLATELET  ?URINALYSIS, COMPLETE (UACMP) WITH MICROSCOPIC  ?TROPONIN I (HIGH SENSITIVITY)  ? ? ? ?  EKG ? ?EKG is remarkable for sinus rhythm with a ventricular rate of 97, Q waves in inferior leads suggestive of prior infarct without other clear evidence of acute ischemia other than isolated ST change in V6. ? ? ?RADIOLOGY ? ? ?CT head reviewed by myself without evidence of hemorrhage, edema, mass effect or clear acute ischemia.  As reviewed radiology interpretation. ? ?PROCEDURES: ? ?Critical Care performed: Yes, see critical care procedure note(s) ? ?.Critical Care ?Performed by: Lucrezia Starch, MD ?Authorized by: Lucrezia Starch, MD  ? ?Critical care provider statement:  ?  Critical care time (minutes):  30 ?  Critical care was necessary to treat or prevent imminent or life-threatening deterioration of the following conditions:  CNS failure or compromise ?  Critical care was time spent personally by me  on the following activities:  Development of treatment plan with patient or surrogate, discussions with consultants, evaluation of patient's response to treatment, examination of patient, ordering and review of laboratory studies, ordering and review of radiographic studies, ordering and performing treatments and interventions, pulse oximetry, re-evaluation of patient's condition and review of old charts ? ? ? ?MEDICATIONS ORDERED IN ED: ?Medications  ?haloperidol lactate (HALDOL) injection 5 mg (5 mg Intramuscular Given 02/04/22 1455)  ? ? ? ?IMPRESSION / MDM / ASSESSMENT AND PLAN / ED COURSE  ?I reviewed the triage vital signs and the nursing notes. ?             ?               ? ?Differential diagnosis includes, but is not limited to delirium/encephalopathy secondary to acute infectious process, endocrine derangement, arrhythmia, ACS, intracranial hemorrhage, metastatic brain lesion, CVA versus exacerbation of underlying psychiatric order disorder. ? ?CT head reviewed by myself without evidence of hemorrhage, edema, mass effect or clear acute ischemia.  As reviewed radiology interpretation. ? ?EKG is remarkable for sinus rhythm with a ventricular rate of 97, Q waves in inferior leads suggestive of prior infarct without other clear evidence of acute ischemia other than isolated ST change in V6. ? ?Care patient signed over to assuming provider approximately 1545.  Plan is to follow-up labs and urine as well as have psych see patient and dispo with that plan. ? ?  ? ? ?FINAL CLINICAL IMPRESSION(S) / ED DIAGNOSES  ? ?Final diagnoses:  ?Altered mental status, unspecified altered mental status type  ? ? ? ?Rx / DC Orders  ? ?ED Discharge Orders   ? ? None  ? ?  ? ? ? ?Note:  This document was prepared using Dragon voice recognition software and may include unintentional dictation errors. ?  ?Lucrezia Starch, MD ?02/04/22 1551 ? ?

## 2022-02-04 NOTE — BH Assessment (Signed)
Writer unable to assess patient at this time. Patient given medication for agitation. Patient could not be aroused. Will try back later.  ?

## 2022-02-04 NOTE — Patient Outreach (Signed)
[name]) Care Management ? ?02/04/2022 ? ?Sabrina [name] ?[birth year] ?[id number] ? ? ?Received hospital referral from Netta Cedars, RN for RN case manager for complex case management services. Assigned patient to [name], RN for follow up. ? ?[name] ?[name] ?[phone] ? ?

## 2022-02-04 NOTE — ED Notes (Signed)
Pt very agitated at this time, yelling and cursing, refusing to let others near her, telling this nurse "it's a HIPPA violation if you get near me". Yelling and complaining in room. ?

## 2022-02-04 NOTE — ED Notes (Signed)
Pt yelling out "help! Help!" When told pt going to do catheter for urine. Pt now calling out "Dr Tamala Julian! Dr Tamala Julian!" And yelling that staff tried to make her walk and she can't walk.  ?

## 2022-02-04 NOTE — ED Triage Notes (Signed)
Pt here for aggressive behavior per daughter pt has hx of the same. Pt refusing everything other then VS, does not want daughter present.  ?

## 2022-02-04 NOTE — ED Notes (Signed)
All monitoring cords removed from room but purewick still in place. Cords are in drawer. ?

## 2022-02-04 NOTE — ED Notes (Signed)
IVC/Still pending consult ?

## 2022-02-04 NOTE — ED Notes (Signed)
Wristband from past hospital admission removed by this tech. Correct wristband remains on Pt's left wrist. ?

## 2022-02-04 NOTE — ED Provider Notes (Signed)
Patient's labs CT of the head etc. are essentially negative.  Patient is medically cleared.  EKG was read as normal sinus rhythm rate of 97 left axis no acute ST-T wave changes CT of the head showed no intracranial problems urine was clear UDS was clear alcohol was negative sodium slightly low at 134 ?  ?[name], MD ?02/04/22 1717 ? ?

## 2022-02-04 NOTE — ED Notes (Signed)
Pt is medically cleared and is now psych only. Pt dressed out in [facility] scrubs. Pt belongings include: ?Red blouse ?Red shorts ?White bra ?Black slippers ? ?

## 2022-02-05 ENCOUNTER — Encounter: Payer: Self-pay | Admitting: Internal Medicine

## 2022-02-05 DIAGNOSIS — I2692 Saddle embolus of pulmonary artery without acute cor pulmonale: Secondary | ICD-10-CM | POA: Diagnosis not present

## 2022-02-05 DIAGNOSIS — N179 Acute kidney failure, unspecified: Secondary | ICD-10-CM

## 2022-02-05 DIAGNOSIS — R5381 Other malaise: Secondary | ICD-10-CM

## 2022-02-05 DIAGNOSIS — F312 Bipolar disorder, current episode manic severe with psychotic features: Secondary | ICD-10-CM

## 2022-02-05 LAB — BASIC METABOLIC PANEL
Anion gap: 8 (ref 5–15)
BUN: 25 mg/dL — ABNORMAL HIGH (ref 8–23)
CO2: 24 mmol/L (ref 22–32)
Calcium: 8.7 mg/dL — ABNORMAL LOW (ref 8.9–10.3)
Chloride: 103 mmol/L (ref 98–111)
Creatinine, Ser: 1.9 mg/dL — ABNORMAL HIGH (ref 0.44–1.00)
GFR, Estimated: 27 mL/min — ABNORMAL LOW (ref >60)
Glucose, Bld: 115 mg/dL — ABNORMAL HIGH (ref 70–99)
Potassium: 4.1 mmol/L (ref 3.5–5.1)
Sodium: 135 mmol/L (ref 135–145)

## 2022-02-05 LAB — GLUCOSE, CAPILLARY
Glucose-Capillary: 88 mg/dL (ref 70–99)
Glucose-Capillary: 95 mg/dL (ref 70–99)

## 2022-02-05 LAB — TROPONIN I (HIGH SENSITIVITY): Troponin I (High Sensitivity): 7 ng/L (ref <18)

## 2022-02-05 LAB — CBG MONITORING, ED: Glucose-Capillary: 136 mg/dL — ABNORMAL HIGH (ref 70–99)

## 2022-02-05 LAB — TOTAL BILIRUBIN, BODY FLUID: Total bilirubin, fluid: 0.4 mg/dL

## 2022-02-05 MED ORDER — SPIRONOLACTONE 25 MG PO TABS
25.0000 mg | ORAL_TABLET | Freq: Every day | ORAL | Status: DC
  Administered 2022-02-05: 25 mg via ORAL
  Filled 2022-02-05: qty 1

## 2022-02-05 MED ORDER — ONDANSETRON HCL 4 MG PO TABS: 4.0000 mg | ORAL_TABLET | Freq: Four times a day (QID) | ORAL | Status: DC | PRN

## 2022-02-05 MED ORDER — OLANZAPINE 5 MG PO TBDP: 2.5000 mg | ORAL_TABLET | Freq: Every day | ORAL | Status: DC

## 2022-02-05 MED ORDER — SODIUM CHLORIDE 0.9% FLUSH
3.0000 mL | Freq: Two times a day (BID) | INTRAVENOUS | Status: DC
  Administered 2022-02-05 – 2022-02-08 (×8): 3 mL via INTRAVENOUS

## 2022-02-05 MED ORDER — POLYETHYLENE GLYCOL 3350 17 G PO PACK: 17.0000 g | PACK | Freq: Every day | ORAL | Status: DC | PRN

## 2022-02-05 MED ORDER — PROPRANOLOL HCL ER 60 MG PO CP24
60.0000 mg | ORAL_CAPSULE | Freq: Every day | ORAL | Status: DC
  Administered 2022-02-05 – 2022-02-10 (×5): 60 mg via ORAL
  Filled 2022-02-05 (×6): qty 1

## 2022-02-05 MED ORDER — OLANZAPINE 5 MG PO TBDP
2.5000 mg | ORAL_TABLET | Freq: Two times a day (BID) | ORAL | Status: DC
  Administered 2022-02-05 – 2022-02-10 (×10): 2.5 mg via ORAL
  Filled 2022-02-05 (×10): qty 0.5

## 2022-02-05 MED ORDER — PANTOPRAZOLE SODIUM 40 MG PO TBEC
40.0000 mg | DELAYED_RELEASE_TABLET | Freq: Every day | ORAL | Status: DC
  Administered 2022-02-05 – 2022-02-10 (×6): 40 mg via ORAL
  Filled 2022-02-05 (×6): qty 1

## 2022-02-05 MED ORDER — SODIUM CHLORIDE 0.9% FLUSH: 3.0000 mL | INTRAVENOUS | Status: DC | PRN

## 2022-02-05 MED ORDER — METFORMIN HCL 500 MG PO TABS: 1000.0000 mg | ORAL_TABLET | Freq: Two times a day (BID) | ORAL | Status: DC

## 2022-02-05 MED ORDER — SODIUM CHLORIDE 0.9 % IV SOLN: 250.0000 mL | INTRAVENOUS | Status: DC | PRN

## 2022-02-05 MED ORDER — INSULIN ASPART 100 UNIT/ML IJ SOLN
0.0000 [IU] | Freq: Three times a day (TID) | INTRAMUSCULAR | Status: DC
  Administered 2022-02-05 – 2022-02-09 (×8): 2 [IU] via SUBCUTANEOUS
  Administered 2022-02-09: 3 [IU] via SUBCUTANEOUS
  Administered 2022-02-09 – 2022-02-10 (×2): 2 [IU] via SUBCUTANEOUS
  Filled 2022-02-05 (×11): qty 1

## 2022-02-05 MED ORDER — ONDANSETRON HCL 4 MG/2ML IJ SOLN: 4.0000 mg | Freq: Four times a day (QID) | INTRAMUSCULAR | Status: DC | PRN

## 2022-02-05 MED ORDER — OLANZAPINE 10 MG IM SOLR
2.5000 mg | Freq: Two times a day (BID) | INTRAMUSCULAR | Status: DC
  Filled 2022-02-05 (×10): qty 10

## 2022-02-05 MED ORDER — APIXABAN 5 MG PO TABS
5.0000 mg | ORAL_TABLET | Freq: Two times a day (BID) | ORAL | Status: DC
  Administered 2022-02-05 – 2022-02-10 (×11): 5 mg via ORAL
  Filled 2022-02-05 (×11): qty 1

## 2022-02-05 NOTE — Evaluation (Signed)
Occupational Therapy Evaluation ?Patient Details ?Name: Sabrina [name] ?MRN: [id number] ?DOB: 02/16/1946 ?Today's Date: 02/05/2022 ? ? ?History of Present Illness Patient is a 76 y.o. female  female with medical history significant for type 2 diabetes, hyperlipidemia, hypertension, recently diagnosed saddle PE status post thrombectomy 3 weeks ago, now on Eliquis, CVA, postmenopausal bleeding with concern for endometrial cancer recent hospitalization with discharge home yesterday for management of an AKI who presents from home by daughter with concerns for worsening altered mental status and aggressiveness and confusion.  ? ?Clinical Impression ?  ?Pt was seen for OT evaluation this date. Pt back to the hospital after just ~1 day since last admission. Pt alert and oriented x2 and able to follow commands with increased time and cues. She required increased assist from previous admission, MOD A for bed mobility and STS and SPT transfers to the [facility]. Able to complete pericare in standing with MIN A for standing balance. MOD A for LB dressing. Pt endorsing significant abdominal pain. Had a loose BM on BSC. Currently pt demonstrates impairments as described below (See OT problem list) which functionally limit her ability to perform ADL/self-care tasks. Pt currently demonstrating increased impairments and requiring increased assist from previous admission. Pt would benefit from skilled OT services to address noted impairments and functional limitations (see below for any additional details) in order to maximize safety and independence while minimizing falls risk and caregiver burden. Upon hospital discharge, recommend STR to maximize pt safety and return to PLOF.    ? ?Recommendations for follow up therapy are one component of a multi-disciplinary discharge planning process, led by the attending physician.  Recommendations may be updated based on patient status, additional functional criteria and insurance authorization.   ? ?Follow Up Recommendations ? Skilled nursing-short term rehab (<3 hours/day)  ?  ?Assistance Recommended at Discharge Frequent or constant Supervision/Assistance  ?Patient can return home with the following Assistance with cooking/housework;Assist for transportation;Help with stairs or ramp for entrance;Direct supervision/assist for medications management;A lot of help with walking and/or transfers;A lot of help with bathing/dressing/bathroom ? ?  ?Functional Status Assessment ? Patient has had a recent decline in their functional status and demonstrates the ability to make significant improvements in function in a reasonable and predictable amount of time.  ?Equipment Recommendations ? Other (comment) (defer to next venue)  ?  ?Recommendations for Other Services   ? ? ?  ?Precautions / Restrictions Precautions ?Precautions: Fall ?Restrictions ?Weight Bearing Restrictions: No  ? ?  ? ?Mobility Bed Mobility ?Overal bed mobility: Needs Assistance ?Bed Mobility: Supine to Sit, Sit to Supine ?  ?  ?Supine to sit: Mod assist ?Sit to supine: Mod assist ?  ?General bed mobility comments: increased time/effort ?  ? ?Transfers ?Overall transfer level: Needs assistance ?Equipment used: None, 1 person hand held assist ?Transfers: Sit to/from Stand, Bed to chair/wheelchair/BSC ?Sit to Stand: Mod assist ?Stand pivot transfers: Mod assist, Min assist ?  ?  ?  ?  ?  ?  ? ?  ?Balance Overall balance assessment: Needs assistance ?Sitting-balance support: Feet supported, No upper extremity supported ?Sitting balance-Leahy Scale: Fair ?  ?  ?Standing balance support: Single extremity supported ?Standing balance-Leahy Scale: Poor ?Standing balance comment: required MIN A for standing balance ?  ?  ?  ?  ?  ?  ?  ?  ?  ?  ?  ?   ? ?ADL either performed or assessed with clinical judgement  ? ?ADL Overall ADL's : Needs assistance/impaired ?  ?  ?  ?  ?  ?  ?  ?  ?  ?  ?  Lower Body Dressing: Moderate assistance;Sit to/from stand ?Lower  Body Dressing Details (indicate cue type and reason): mod a to initiate donning over feet, and to complete donning over hips ?Toilet Transfer: BSC/3in1;Stand-pivot;Moderate assistance ?  ?Toileting- Clothing Manipulation and Hygiene: Set up;Sit to/from stand;Min guard ?  ?  ?  ?  ?   ? ? ? ?Vision   ?   ?   ?Perception   ?  ?Praxis   ?  ? ?Pertinent Vitals/Pain Pain Assessment ?Pain Assessment: 0-10 ?Pain Score: 8  ?Pain Location: lower abdominal pain ?Pain Descriptors / Indicators: Aching ?Pain Intervention(s): Limited activity within patient's tolerance, Monitored during session, Repositioned  ? ? ? ?Hand Dominance Right ?  ?Extremity/Trunk Assessment Upper Extremity Assessment ?Upper Extremity Assessment: Generalized weakness ?  ?Lower Extremity Assessment ?Lower Extremity Assessment: Generalized weakness ?  ?  ?  ?Communication Communication ?Communication: No difficulties ?  ?Cognition Arousal/Alertness: Awake/alert ?Behavior During Therapy: [facility] for tasks assessed/performed ?Overall Cognitive Status: No family/caregiver present to determine baseline cognitive functioning ?  ?  ?  ?  ?  ?  ?  ?  ?  ?  ?  ?  ?  ?  ?  ?  ?General Comments: pt alert and oriented to self and place, follows commands with cues/time ?  ?  ?General Comments    ? ?  ?Exercises   ?  ?Shoulder Instructions    ? ? ?Home Living Family/patient expects to be discharged to:: Private residence ?Living Arrangements: Children ?Available Help at Discharge: Family ?Type of Home: House ?Home Access: Stairs to enter ?Entrance Stairs-Number of Steps: 2 ?Entrance Stairs-Rails: None ?Home Layout: Two level;Able to live on main level with bedroom/bathroom ?  ?  ?  ?  ?  ?  ?  ?Home Equipment: Cane - single Barista (2 wheels);Rollator (4 wheels);Shower seat ?  ?  ?  ? ?  ?Prior Functioning/Environment Prior Level of Function : Needs assist ?  ?  ?  ?  ?  ?  ?Mobility Comments: patient reports declined in functional mobility, needing at least  stand by assistance from family with ambulation ?ADLs Comments: assistance provided from daughter for lower body dressing, per chart pt required additional assist since recent discharge ?  ? ?  ?  ?OT Problem List: Decreased strength;Impaired balance (sitting and/or standing);Decreased safety awareness;Pain;Decreased knowledge of use of DME or AE;Decreased cognition ?  ?   ?OT Treatment/Interventions: Self-care/ADL training;Therapeutic exercise;Therapeutic activities;DME and/or AE instruction;Patient/family education;Balance training;Cognitive remediation/compensation  ?  ?OT Goals(Current goals can be found in the care plan section) Acute Rehab OT Goals ?Patient Stated Goal: get better ?OT Goal Formulation: With patient ?Time For Goal Achievement: 02/19/22 ?Potential to Achieve Goals: Good ?ADL Goals ?Pt Will Perform Lower Body Dressing: with min assist;sit to/from stand ?Pt Will Transfer to Toilet: with supervision;ambulating ?Pt Will Perform Toileting - Clothing Manipulation and hygiene: with supervision;sit to/from stand ?Additional ADL Goal #1: Pt will complete aspects of bathing with MIN A from seated position.  ?OT Frequency: Min 2X/week ?  ? ?Co-evaluation   ?  ?  ?  ?  ? ?  ?AM-PAC OT "6 Clicks" Daily Activity     ?Outcome Measure Help from another person eating meals?: None ?Help from another person taking care of personal grooming?: A Little ?Help from another person toileting, which includes using toliet, bedpan, or urinal?: A Lot ?Help from another person bathing (including washing, rinsing, drying)?: A Lot ?Help from another person to put on and  taking off regular upper body clothing?: A Little ?Help from another person to put on and taking off regular lower body clothing?: A Lot ?6 Click Score: 16 ?  ?End of Session Equipment Utilized During Treatment: Gait belt ? ?Activity Tolerance: Patient tolerated treatment well ?Patient left: in bed;with call bell/phone within reach;with bed alarm set;with  nursing/sitter in room ? ?OT Visit Diagnosis: Other abnormalities of gait and mobility (R26.89);Muscle weakness (generalized) (M62.81);History of falling (Z91.81)  ?              ?Time: 8413-2440 ?OT Time Calcula

## 2022-02-05 NOTE — Evaluation (Signed)
Physical Therapy Evaluation ?Patient Details ?Name: Sabrina [name] ?MRN: [id number] ?DOB: [birth year] ?Today's Date: 02/05/2022 ? ?History of Present Illness ? Patient is a 76 y.o. female  female with medical history significant for type 2 diabetes, hyperlipidemia, hypertension, recently diagnosed saddle PE status post thrombectomy 3 weeks ago, now on Eliquis, CVA, postmenopausal bleeding with concern for endometrial cancer recent hospitalization with discharge home yesterday for management of an AKI who presents from home by daughter with concerns for worsening altered mental status and aggressiveness and confusion.  ?Clinical Impression ? Patient was agreeable and cooperative with assessment. Patient reports she was ambulatory with rolling walker with supervision or stand by assistance from family prior to this admission.  ?She currently needs assistance for bed mobility and for standing. She declined ambulation this date, however nursing staff reports patient required physical assistance for short distance ambulation earlier today. The patient does not appear to be at her baseline level of functional mobility. Recommend SNF placement at discharge. PT will continue to follow to maximize independence and facilitate return to prior level of function.  ?   ? ?Recommendations for follow up therapy are one component of a multi-disciplinary discharge planning process, led by the attending physician.  Recommendations may be updated based on patient status, additional functional criteria and insurance authorization. ? ?Follow Up Recommendations Skilled nursing-short term rehab (<3 hours/day) ? ?  ?Assistance Recommended at Discharge Frequent or constant Supervision/Assistance  ?Patient can return home with the following ? Two people to help with walking and/or transfers;A lot of help with bathing/dressing/bathroom;Help with stairs or ramp for entrance;Assist for transportation ? ?  ?Equipment Recommendations None recommended  by PT  ?Recommendations for Other Services ?    ?  ?Functional Status Assessment Patient has had a recent decline in their functional status and demonstrates the ability to make significant improvements in function in a reasonable and predictable amount of time.  ? ?  ?Precautions / Restrictions Precautions ?Precautions: Fall ?Restrictions ?Weight Bearing Restrictions: No  ? ?  ? ?Mobility ? Bed Mobility ?Overal bed mobility: Needs Assistance ?Bed Mobility: Supine to Sit, Sit to Supine ?  ?  ?Supine to sit: Min assist ?Sit to supine: Mod assist ?  ?General bed mobility comments: assistance for trunk support to sit upright. assistance for LE support to return to bed ?  ? ?Transfers ?Overall transfer level: Needs assistance ?Equipment used: None ?Transfers: Sit to/from Stand ?Sit to Stand: Mod assist ?  ?  ?  ?  ?  ?General transfer comment: cues for safety and technique. faciliation for forward weight shifting with lifting and lowering assistance required for standing ?  ? ?Ambulation/Gait ?  ?  ?  ?  ?  ?  ?  ?General Gait Details: patient declined ambulation. staff reports +2 person assistance for short distance ambulation earlier today ? ?Stairs ?  ?  ?  ?  ?  ? ?Wheelchair Mobility ?  ? ?Modified Rankin (Stroke Patients Only) ?  ? ?  ? ?Balance Overall balance assessment: Needs assistance ?Sitting-balance support: Feet supported, No upper extremity supported ?Sitting balance-Leahy Scale: Fair ?  ?  ?Standing balance support: Single extremity supported ?Standing balance-Leahy Scale: Fair ?Standing balance comment: Min A itnially progressing to Min guard with increased standing time. maximal assistance required to adjust brief while standing ?  ?  ?  ?  ?  ?  ?  ?  ?  ?  ?  ?   ? ? ? ?Pertinent Vitals/Pain Pain  Assessment ?Pain Assessment: No/denies pain  ? ? ?Home Living Family/patient expects to be discharged to:: Private residence ?Living Arrangements: Children ?Available Help at Discharge: Family ?Type of Home:  House ?Home Access: Stairs to enter ?Entrance Stairs-Rails: None ?Entrance Stairs-Number of Steps: 2 ?  ?Home Layout: Two level;Able to live on main level with bedroom/bathroom ?Home Equipment: Cane - single Barista (2 wheels);Rollator (4 wheels);Shower seat ?   ?  ?Prior Function Prior Level of Function : Needs assist ?  ?  ?  ?  ?  ?  ?Mobility Comments: patient reports declined in functional mobility, needing at least stand by assistance from family with ambulation ?ADLs Comments: assistance provided from daughter for lower body dressing ?  ? ? ?Hand Dominance  ?   ? ?  ?Extremity/Trunk Assessment  ? Upper Extremity Assessment ?Upper Extremity Assessment: Generalized weakness ?  ? ?Lower Extremity Assessment ?Lower Extremity Assessment: Generalized weakness ?  ? ?   ?Communication  ? Communication: No difficulties  ?Cognition Arousal/Alertness: Awake/alert ?Behavior During Therapy: [facility] for tasks assessed/performed ?Overall Cognitive Status: Within Functional Limits for tasks assessed ?  ?  ?  ?  ?  ?  ?  ?  ?  ?  ?  ?  ?  ?  ?  ?  ?General Comments: patient is able to follow single step commands with extra time ?  ?  ? ?  ?General Comments   ? ?  ?Exercises    ? ?Assessment/Plan  ?  ?PT Assessment Patient needs continued PT services  ?PT Problem List Decreased strength;Decreased range of motion;Decreased activity tolerance;Decreased balance;Decreased mobility;Decreased safety awareness ? ?   ?  ?PT Treatment Interventions DME instruction;Gait training;Stair training;Functional mobility training;Therapeutic activities;Therapeutic exercise;Balance training;Neuromuscular re-education;Cognitive remediation;Patient/family education   ? ?PT Goals (Current goals can be found in the Care Plan section)  ?Acute Rehab PT Goals ?Patient Stated Goal: to be more independent ?PT Goal Formulation: With patient ?Time For Goal Achievement: 02/19/22 ?Potential to Achieve Goals: Good ? ?  ?Frequency Min 2X/week ?   ? ? ?Co-evaluation   ?  ?  ?  ?  ? ? ?  ?AM-PAC PT "6 Clicks" Mobility  ?Outcome Measure Help needed turning from your back to your side while in a flat bed without using bedrails?: A Little ?Help needed moving from lying on your back to sitting on the side of a flat bed without using bedrails?: A Little ?Help needed moving to and from a bed to a chair (including a wheelchair)?: A Lot ?Help needed standing up from a chair using your arms (e.g., wheelchair or bedside chair)?: A Lot ?Help needed to walk in hospital room?: A Lot ?Help needed climbing 3-5 steps with a railing? : A Lot ?6 Click Score: 14 ? ?  ?End of Session   ?Activity Tolerance: Patient tolerated treatment well ?Patient left: in bed;with call bell/phone within reach;with bed alarm set ?Nurse Communication: Mobility status ?PT Visit Diagnosis: Unsteadiness on feet (R26.81);Muscle weakness (generalized) (M62.81) ?  ? ?Time: 8182-9937 ?PT Time Calculation (min) (ACUTE ONLY): 23 min ? ? ?Charges:   PT Evaluation ?$PT Eval Low Complexity: 1 Low ?PT Treatments ?$Therapeutic Exercise: 8-22 mins ?  ?   ? ? ?Minna Merritts, PT, MPT ? ? ?Percell Locus ?02/05/2022, 3:10 PM ? ?

## 2022-02-05 NOTE — Assessment & Plan Note (Addendum)
Baseline serum creatinine 1.1 and it peaked at 3.7 during her last hospitalization.   ?Renal functions continues to improve but not back to baseline yet. 1.60 today ?Avoid nephrotoxic medications.  Continue to monitor serum creatinine. ?

## 2022-02-05 NOTE — Progress Notes (Signed)
Patient is resting in bed quietly. A&O.  ?

## 2022-02-05 NOTE — Consult Note (Addendum)
Consult received for medications recommendations for patient's AMS/behaviors. Writer spoke with patient's daughter [name], [phone]), who appears to be very attentive and responsive to patient's needs. She states that patient has been hospitalized in behavioral health unit here for psychotic episode (chart review, in June 2017), as well as seeing PCP and neurologist for behavioral sisues. Patient refuses psychiatric meds in the past. Daughter reports "bi-polar" was mentioned but no definite diagnosis. The last 6 months or so by the neurologist, who recommended Depakote and risperidone, but patient refused. Daughter says Neurologist has also felt patient has dementia. Complicating all of these symptoms is patient's endometrial cancer diagnosis, along with her "kidney problems," daughter reports.  Daughter says that she believes patient's cancer  has metastasized.   ? ?Daughter is agreeable to trying an antipsychotic at a small dose at night. She does not feel patient will comply. Writer prescribed Zyprexa 0.25 mg twice daily, oral or IM.  May increase as needed and provide as needed coverage if appropriate. Dr. Francine Graven made aware. ? ?Sherlon Handing, PMHNP-BC ?

## 2022-02-05 NOTE — Progress Notes (Signed)
[place] [facility]) Hospital Liaison note: ? ?This is a pending outpatient-based Palliative Care patient. Will continue to follow for disposition. ? ?Please call with any outpatient palliative questions or concerns. ? ?Thank you, ?Lorelee Market, LPN ?[facility] Hospital Liaison ?[phone] ?

## 2022-02-05 NOTE — ED Notes (Signed)
IVC/Consult completed/ Doesn't meet criteria for admit ?

## 2022-02-05 NOTE — Assessment & Plan Note (Addendum)
Patient presented to the ER for evaluation of aggressive behavior and confusion.  ?She has received a dose of Haldol and midazolam in the ER ?Currently on Zyprexa 2.5 mg at bedtime. ?Patient was assessed by psychiatry, does not meet inpatient criteria for admission. ?She seems much improved, calmer, cooperative, following commands. ?

## 2022-02-05 NOTE — ED Provider Notes (Signed)
Emergency Medicine Observation Re-evaluation Note ? ?DEOLINDA FRID is a 76 y.o. female, seen on rounds today.  Pt initially presented to the ED for complaints of Psychiatric Evaluation ?Currently, the patient is resting comfortably. ? ?Physical Exam  ?BP (!) 106/59   Pulse 90   Temp 97.7 ?F (36.5 ?C) (Oral)   Resp 14   Wt 57.6 kg   SpO2 96%   BMI 23.23 kg/m?  ?Physical Exam ?General: No acute distress ?Cardiac: Well-perfused extremities ?Lungs: No respiratory distress ?Psych: Appropriate mood and affect ? ?ED Course / MDM  ?EKG:  ? ?I have reviewed the labs performed to date as well as medications administered while in observation.  Recent changes in the last 24 hours include placement. ? ?Plan  ?Current plan is for placement. ?TANIQUE MATNEY is under involuntary commitment. ?  ? ?  ?Naaman Plummer, MD ?02/05/22 0010 ? ?

## 2022-02-05 NOTE — Consult Note (Signed)
[facility] Face-to-Face Psychiatry Consult  ? ?Reason for Consult: Psychiatric Evaluation ?Referring Physician:  Dr. Tamala Julian ?Patient Identification: Sabrina [name] ?MRN:  [id number] ?Principal Diagnosis: <principal problem not specified> ?Diagnosis:  Active Problems: ?  Bipolar affective disorder, manic, severe, with psychotic behavior ([place]) ?  Syncope ?  Stroke [facility]) ?  Dementia with aggressive behavior ([place]) ?  Stroke, lacunar ([place]) ? ? ?Total Time spent with patient: 45 minutes ? ?Subjective: "I am not going let her treat me like I am a dog."   ?Sabrina [name] is a 76 y.o. female patient presented to [facility] ED under involuntary commitment (IVC). Per the triage note, Pt is here for aggressive behavior; per daughter, pt has hx of the same. Pt refusing everything other than VS and does not want her daughter present  ?This provider saw the patient face-to-face; the chart was reviewed, and consulted with Dr.Bradler on 02/05/2022 due to the patient's care. It was discussed with the EDP that the patient does not meet the criteria to be admitted to the geriatric-psychiatric inpatient unit.  ?On evaluation, the patient is alert and oriented x4, calm, cooperative, and mood-congruent with affect. The patient does not appear to be responding to internal or external stimuli. Neither is the patient presenting with any delusional thinking. The patient denies auditory or visual hallucinations. The patient denies any suicidal, homicidal, or self-harm ideations. The patient is not presenting with any psychotic or paranoid behaviors.  ? ? ?HPI: Per Dr. Tamala Julian,   ?Sabrina [name] is a 76 y.o. female  female with medical history significant for type 2 diabetes, hyperlipidemia, hypertension, recently diagnosed saddle PE status post thrombectomy 3 weeks ago, now on Eliquis, CVA, postmenopausal bleeding with concern for endometrial cancer recent hospitalization with discharge home yesterday for management of an AKI who presents from home by daughter  with concerns for worsening altered mental status and aggressiveness and confusion.  Per daughter she has some dementia and underlying bipolar disorder but is not currently being treated for this.  She notes the patient is on Eliquis for PE and never followed up with regards to her endometrial cancer and so is not being treated for this as well.  He states the patient was intermittently aggressive and confused in the hospital but went she was discharged and seemed to be coherent but has been gotten more confused and aggressive since getting home.  She feels that the patient is not currently safe.  Emergency room when I asked the patient why she is here she states "because on my left eye".  She is intermittently yelling at the examiner and shouting nonsensical things.  At one point she sounds "due to cold".  Does endorse some pain but then is unable to clarify where.  She denies any SI or HI.  When asked about other review of systems she states "do you" to almost every question.  Per daughter no recent falls that she is aware of.  Daughter also does not believe she has been in alcohol or illegal drugs. ?  ? ?Past Psychiatric History:  ? ?Risk to Self:   ?Risk to Others:   ?Prior Inpatient Therapy:   ?Prior Outpatient Therapy:   ? ?Past Medical History:  ?Past Medical History:  ?Diagnosis Date  ? Diabetes mellitus without complication ([place])   ? Endometrial cancer ([place]) 01/29/2022  ? High cholesterol   ? Hypertension   ?  ?Past Surgical History:  ?Procedure Laterality Date  ? DILATION AND CURETTAGE OF UTERUS    ?  POLYPECTOMY    ? PULMONARY THROMBECTOMY Bilateral 01/08/2022  ? Procedure: PULMONARY THROMBECTOMY;  Surgeon: Algernon Huxley, MD;  Location: [facility];  Service: Cardiovascular;  Laterality: Bilateral;  ? ?Family History:  ?Family History  ?Problem Relation Age of Onset  ? Breast cancer Maternal Aunt   ?     2 aunts in their 68's  ? Breast cancer Maternal Aunt 74  ? Ovarian cancer Neg Hx   ? Colon cancer  Neg Hx   ? ?Family Psychiatric  History:  ?Social History:  ?Social History  ? ?Substance and Sexual Activity  ?Alcohol Use No  ?   ?Social History  ? ?Substance and Sexual Activity  ?Drug Use No  ?  ?Social History  ? ?Socioeconomic History  ? Marital status: Divorced  ?  Spouse name: Not on file  ? Number of children: Not on file  ? Years of education: Not on file  ? Highest education level: Not on file  ?Occupational History  ? Occupation: retired  ?Tobacco Use  ? Smoking status: Never  ? Smokeless tobacco: Never  ?Vaping Use  ? Vaping Use: Never used  ?Substance and Sexual Activity  ? Alcohol use: No  ? Drug use: No  ? Sexual activity: Not Currently  ?  Birth control/protection: Post-menopausal  ?Other Topics Concern  ? Not on file  ?Social History Narrative  ? Not on file  ? ?Social Determinants of Health  ? ?Financial Resource Strain: Not on file  ?Food Insecurity: Not on file  ?Transportation Needs: Not on file  ?Physical Activity: Not on file  ?Stress: Not on file  ?Social Connections: Not on file  ? ?Additional Social History: ?  ? ?Allergies:   ?Allergies  ?Allergen Reactions  ? Glimepiride Diarrhea and Other (See Comments)  ? Glipizide Other (See Comments)  ? Lisinopril Other (See Comments)  ?  headache  ? Losartan Other (See Comments)  ?  Headache  ? Norco [Hydrocodone-Acetaminophen] Other (See Comments)  ?  Mood changes  ? Pioglitazone Swelling  ?  On legs  ? Penicillins Diarrhea and Rash  ? ? ?Labs:  ?Results for orders placed or performed during the hospital encounter of 02/04/22 (from the past 48 hour(s))  ?Resp Panel by RT-PCR (Flu A&B, Covid) Nasopharyngeal Swab     Status: None  ? Collection Time: 02/04/22  2:27 PM  ? Specimen: Nasopharyngeal Swab; Nasopharyngeal(NP) swabs in vial transport medium  ?Result Value Ref Range  ? SARS Coronavirus 2 by RT PCR NEGATIVE NEGATIVE  ?  Comment: (NOTE) ?SARS-CoV-2 target nucleic acids are NOT DETECTED. ? ?The SARS-CoV-2 RNA is generally detectable in upper  respiratory ?specimens during the acute phase of infection. The lowest ?concentration of SARS-CoV-2 viral copies this assay can detect is ?138 copies/mL. A negative result does not preclude SARS-Cov-2 ?infection and should not be used as the sole basis for treatment or ?other patient management decisions. A negative result may occur with  ?improper specimen collection/handling, submission of specimen other ?than nasopharyngeal swab, presence of viral mutation(s) within the ?areas targeted by this assay, and inadequate number of viral ?copies(<138 copies/mL). A negative result must be combined with ?clinical observations, patient history, and epidemiological ?information. The expected result is Negative. ? ?Fact Sheet for Patients:  ?EntrepreneurPulse.com.au ? ?Fact Sheet for Healthcare Providers:  ?IncredibleEmployment.be ? ?This test is no t yet approved or cleared by the [place] FDA and  ?has been authorized for detection and/or diagnosis of SARS-CoV-2 by ?FDA under  an Emergency Use Authorization (EUA). This EUA will remain  ?in effect (meaning this test can be used) for the duration of the ?COVID-19 declaration under Section 564(b)(1) of the Act, 21 ?U.S.C.section 360bbb-3(b)(1), unless the authorization is terminated  ?or revoked sooner.  ? ? ?  ? Influenza A by PCR NEGATIVE NEGATIVE  ? Influenza B by PCR NEGATIVE NEGATIVE  ?  Comment: (NOTE) ?The Xpert Xpress SARS-CoV-2/FLU/RSV plus assay is intended as an aid ?in the diagnosis of influenza from Nasopharyngeal swab specimens and ?should not be used as a sole basis for treatment. Nasal washings and ?aspirates are unacceptable for Xpert Xpress SARS-CoV-2/FLU/RSV ?testing. ? ?Fact Sheet for Patients: ?EntrepreneurPulse.com.au ? ?Fact Sheet for Healthcare Providers: ?IncredibleEmployment.be ? ?This test is not yet approved or cleared by the [place] FDA and ?has been authorized for  detection and/or diagnosis of SARS-CoV-2 by ?FDA under an Emergency Use Authorization (EUA). This EUA will remain ?in effect (meaning this test can be used) for the duration of the ?COVID-19 declaration u

## 2022-02-05 NOTE — BH Assessment (Signed)
Comprehensive Clinical Assessment (CCA) Note ? ?02/05/2022 ?Sabrina [name] ?[id number] ? ?Chief Complaint: Patient is a 76 year old female presenting to [facility] ED under IVC. Per triage note Pt here for aggressive behavior per daughter pt has hx of the same. Pt refusing everything other then VS, does not want daughter present. During assessment patient appears alert and oriented x4, calm and cooperative. Patient reports understanding why she is here "because I'm here to be evaluated but they have just been moving me from bed to bed." Patient denies being aggressive with her daughter but reports "but she isn't going to talk to me like I'm a dog." Patient denies SI/HI/AH/VH. ? ?Per Psyc NP Ysidro Evert patient does not meet criteria for Inpatient ?Chief Complaint  ?Patient presents with  ? Psychiatric Evaluation  ? ?Visit Diagnosis: Bipolar  ? ? ?CCA Screening, Triage and Referral (STR) ? ?Patient Reported Information ?How did you hear about [place]? Legal System ? ?Referral name: No data recorded ?Referral phone number: No data recorded ? ?Whom do you see for routine medical problems? No data recorded ?Practice/Facility Name: No data recorded ?Practice/Facility Phone Number: No data recorded ?Name of Contact: No data recorded ?Contact Number: No data recorded ?Contact Fax Number: No data recorded ?Prescriber Name: No data recorded ?Prescriber Address (if known): No data recorded ? ?What Is the Reason for Your Visit/Call Today? Patient presents under IVC after being aggressive at home ? ?How Long Has This Been Causing You Problems? > than 6 months ? ?What Do You Feel Would Help You the Most Today? No data recorded ? ?Have You Recently Been in Any Inpatient Treatment (Hospital/Detox/Crisis Center/28-Day Program)? No data recorded ?Name/Location of Program/Hospital:No data recorded ?How Long Were You There? No data recorded ?When Were You Discharged? No data recorded ? ?Have You Ever Received Services From [employer] Before?  No data recorded ?Who Do You See at [facility]? No data recorded ? ?Have You Recently Had Any Thoughts About Hurting Yourself? No ? ?Are You Planning to Commit Suicide/Harm Yourself At This time? No ? ? ?Have you Recently Had Thoughts About [place]? No ? ?Explanation: No data recorded ? ?Have You Used Any Alcohol or Drugs in the Past 24 Hours? No ? ?How Long Ago Did You Use Drugs or Alcohol? No data recorded ?What Did You Use and How Much? No data recorded ? ?Do You Currently Have a Therapist/Psychiatrist? No ? ?Name of Therapist/Psychiatrist: No data recorded ? ?Have You Been Recently Discharged From Any Office Practice or Programs? No ? ?Explanation of Discharge From Practice/Program: No data recorded ? ?  ?CCA Screening Triage Referral Assessment ?Type of Contact: Face-to-Face ? ?Is this Initial or Reassessment? No data recorded ?Date Telepsych consult ordered in [facility]:  No data recorded ?Time Telepsych consult ordered in CHL:  No data recorded ? ?Patient Reported Information Reviewed? No data recorded ?Patient Left Without Being Seen? No data recorded ?Reason for Not Completing Assessment: No data recorded ? ?Collateral Involvement: No data recorded ? ?Does Patient Have a Stage manager Guardian? No data recorded ?Name and Contact of Legal Guardian: No data recorded ?If Minor and Not Living with Parent(s), Who has Custody? No data recorded ?Is CPS involved or ever been involved? Never ? ?Is APS involved or ever been involved? Never ? ? ?Patient Determined To Be At Risk for Harm To Self or Others Based on Review of Patient Reported Information or Presenting Complaint? No ? ?Method: No data recorded ?Availability of Means: No  data recorded ?Intent: No data recorded ?Notification Required: No data recorded ?Additional Information for Danger to Others Potential: No data recorded ?Additional Comments for Danger to Others Potential: No data recorded ?Are There Guns or Other Weapons in [place]? No  data recorded ?Types of Guns/Weapons: No data recorded ?Are These Weapons Safely Secured?                            No data recorded ?Who Could Verify You Are Able To Have These Secured: No data recorded ?Do You Have any Outstanding Charges, Pending Court Dates, Parole/Probation? No data recorded ?Contacted To Inform of Risk of Harm To Self or Others: No data recorded ? ?Location of Assessment: [facility] ED ? ? ?Does Patient Present under Involuntary Commitment? Yes ? ?IVC Papers Initial File Date: 02/04/22 ? ? ?[place] of Residence: [place] ? ? ?Patient Currently Receiving the Following Services: No data recorded ? ?Determination of Need: Emergent (2 hours) ? ? ?Options For Referral: No data recorded ? ? ? ?CCA Biopsychosocial ?Intake/Chief Complaint:  No data recorded ?Current Symptoms/Problems: No data recorded ? ?Patient Reported Schizophrenia/Schizoaffective Diagnosis in Past: No ? ? ?Strengths: Patient is able to communicate her needs ? ?Preferences: No data recorded ?Abilities: No data recorded ? ?Type of Services Patient Feels are Needed: No data recorded ? ?Initial Clinical Notes/Concerns: No data recorded ? ?Mental Health Symptoms ?Depression:   ?None ?  ?Duration of Depressive symptoms: No data recorded  ?Mania:   ?Recklessness ?  ?Anxiety:    ?Irritability ?  ?Psychosis:   ?None ?  ?Duration of Psychotic symptoms: No data recorded  ?Trauma:   ?None ?  ?Obsessions:   ?None ?  ?Compulsions:   ?None ?  ?Inattention:   ?None ?  ?Hyperactivity/Impulsivity:   ?None ?  ?Oppositional/Defiant Behaviors:   ?None ?  ?Emotional Irregularity:   ?None ?  ?Other Mood/Personality Symptoms:  No data recorded  ? ?Mental Status Exam ?Appearance and self-care  ?Stature:   ?Average ?  ?Weight:   ?Average weight ?  ?Clothing:   ?Casual ?  ?Grooming:   ?Normal ?  ?Cosmetic use:   ?None ?  ?Posture/gait:   ?Normal ?  ?Motor activity:   ?Not Remarkable ?  ?Sensorium  ?Attention:   ?Normal ?  ?Concentration:   ?Normal ?   ?Orientation:   ?X5 ?  ?Recall/memory:   ?Normal ?  ?Affect and Mood  ?Affect:   ?Appropriate ?  ?Mood:   ?Other (Comment) ?  ?Relating  ?Eye contact:   ?Normal ?  ?Facial expression:   ?Responsive ?  ?Attitude toward examiner:   ?Cooperative ?  ?Thought and Language  ?Speech flow:  ?Clear and Coherent ?  ?Thought content:   ?Appropriate to Mood and Circumstances ?  ?Preoccupation:   ?None ?  ?Hallucinations:   ?None ?  ?Organization:  No data recorded  ?Executive Functions  ?Fund of Knowledge:   ?[place] ?  ?Intelligence:   ?Average ?  ?Abstraction:   ?Normal ?  ?Judgement:   ?Fair ?  ?Reality Testing:   ?Realistic ?  ?Insight:   ?Fair ?  ?Decision Making:   ?Normal ?  ?Social Functioning  ?Social Maturity:   ?Responsible ?  ?Social Judgement:   ?Normal ?  ?Stress  ?Stressors:   ?Family conflict ?  ?Coping Ability:   ?Normal ?  ?Skill Deficits:   ?None ?  ?Supports:   ?Family ?  ? ? ?Religion: ?Religion/Spirituality ?Are  You A Religious Person?: No ? ?Leisure/Recreation: ?Leisure / Recreation ?Do You Have Hobbies?: No ? ?Exercise/Diet: ?Exercise/Diet ?Do You Exercise?: No ?Have You Gained or Lost A Significant Amount of Weight in the Past Six Months?: No ?Do You Follow a Special Diet?: No ?Do You Have Any Trouble Sleeping?: No ? ? ?CCA Employment/Education ?Employment/Work Situation: ?Employment / Work Situation ?Employment Situation: On disability ?Why is Patient on Disability: Physical disability needs ?How Long has Patient Been on Disability: Unknown ?Has Patient ever Been in the Military?: No ? ?Education: ?Education ?Is Patient Currently Attending School?: No ?Did You Have An Individualized Education Program (IIEP): No ?Did You Have Any Difficulty At School?: No ?Patient's Education Has Been Impacted by Current Illness: No ? ? ?CCA Family/Childhood History ?Family and Relationship History: ?Family history ?Marital status: Single ?Does patient have children?: Yes ?How many children?: 1 ?How is patient's  relationship with their children?: Unknown ? ?Childhood History:  ?Childhood History ?Did patient suffer any verbal/emotional/physical/sexual abuse as a child?: No ?Did patient suffer from severe childhood neglect?: No ?Lamonte Sakai

## 2022-02-05 NOTE — TOC Initial Note (Signed)
Transition of Care (TOC) - Initial/Assessment Note  ? ? ?Patient Details  ?Name: Sabrina [name] ?MRN: [id number] ?Date of Birth: 11/12/1945 ? ?Transition of Care (TOC) CM/SW Contact:    ?Shelbie Hutching, RN ?Phone Number: ?02/05/2022, 3:57 PM ? ?Clinical Narrative:                 ?Patient brought in to the emergency room for aggressive behavior, she was recently discharged from the hospital after PE with thrombectomy.  Patient in the past has been disagreeable and untrusting.  She frequently gets mad at her daughter thinking that she is trying to control her life.  She has been living with her daughter for quite some time.   Daughter expressed to ER nurse that she would like to get patient into [facility].  PT has evaluated and recommended SNF.  RNCM attempted to call daughter to discuss SNF placement will start SNF workup.   ? ?Expected Discharge Plan: [place] ?Barriers to Discharge: SNF Pending bed offer, Continued Medical Work up ? ? ?Patient Goals and CMS Choice ?Patient states their goals for this hospitalization and ongoing recovery are:: Daughter would like patient to go to SNF ?CMS Medicare.gov Compare Post Acute Care list provided to:: Patient Represenative (must comment) ?Choice offered to / list presented to : Adult Children ? ?Expected Discharge Plan and Services ?Expected Discharge Plan: [place] ?  ?Discharge Planning Services: CM Consult ?Post Acute Care Choice: [place] ?Living arrangements for the past 2 months: [place] ?                ?DME Arranged: N/A ?DME Agency: NA ?  ?  ?  ?HH Arranged: PT, OT ?[place] Agency: [facility] ?  ?  ?  ? ?Prior Living Arrangements/Services ?Living arrangements for the past 2 months: [place] ?Lives with:: Adult Children ?Patient language and need for interpreter reviewed:: Yes ?       ?Need for Family Participation in Patient Care: Yes (Comment) ?Care giver support system in place?: Yes  (comment) ?Current home services: DME (RW, shower seat, cane) ?Criminal Activity/Legal Involvement Pertinent to Current Situation/Hospitalization: No - Comment as needed ? ?Activities of Daily Living ?Home Assistive Devices/Equipment: Cane (specify quad or straight), Walker (specify type) ?ADL Screening (condition at time of admission) ?Patient's cognitive ability adequate to safely complete daily activities?: No ?Is the patient deaf or have difficulty hearing?: No ?Does the patient have difficulty seeing, even when wearing glasses/contacts?: No ?Does the patient have difficulty concentrating, remembering, or making decisions?: Yes ?Patient able to express need for assistance with ADLs?: Yes ?Does the patient have difficulty dressing or bathing?: Yes ?Independently performs ADLs?: No ?Communication: Independent ?Dressing (OT): Needs assistance ?Is this a change from baseline?: Pre-admission baseline ?Grooming: Needs assistance ?Is this a change from baseline?: Pre-admission baseline ?Feeding: Independent ?Bathing: Needs assistance ?Is this a change from baseline?: Pre-admission baseline ?Toileting: Needs assistance ?Is this a change from baseline?: Pre-admission baseline ?In/Out Bed: Needs assistance ?Is this a change from baseline?: Pre-admission baseline ?Walks in Home: Needs assistance ?Is this a change from baseline?: Pre-admission baseline ?Does the patient have difficulty walking or climbing stairs?: Yes ?Weakness of Legs: Both ?Weakness of Arms/Hands: None ? ?Permission Sought/Granted ?Permission sought to share information with : Family Supports ?  ? Share Information with NAME: [name] ?   ? Permission granted to share info w Relationship: daughter ? Permission granted to share info w Contact Information: [phone] ? ?  Emotional Assessment ?Appearance:: Appears stated age ?  ?  ?Orientation: : Oriented to Self, Oriented to Place, Oriented to  Time, Oriented to Situation ?Alcohol / Substance Use: Not  Applicable ?Psych Involvement: Yes (comment) ? ?Admission diagnosis:  AKI (acute kidney injury) ([place]) [N17.9] ?Patient Active Problem List  ? Diagnosis Date Noted  ? Physical deconditioning 02/05/2022  ? AKI (acute kidney injury) ([place]) 01/30/2022  ? Metabolic acidosis, increased anion gap 01/29/2022  ? Ascites 01/29/2022  ? Stage IV adenocarcinoma of endometrium ([place]) 01/29/2022  ? Endometrial cancer ([place]) 01/29/2022  ? Microcytic anemia 01/28/2022  ? Postmenopausal bleeding 01/27/2022  ? Hyponatremia 01/10/2022  ? GERD (gastroesophageal reflux disease) 01/08/2022  ? Elevated troponin 01/05/2022  ? Diabetes mellitus without complication ([place]) 57/84/6962  ? Acute saddle pulmonary embolism ([place]) 01/05/2022  ? Abnormal finding present on diagnostic imaging of uterus   ? Generalized weakness   ? History of stroke 03/02/2020  ? Leg weakness, bilateral 03/02/2020  ? Slurred speech 03/02/2020  ? Transient confusion 03/02/2020  ? Acute CVA (cerebrovascular accident) ([place]) 01/31/2020  ? Hyperglycemia due to type 2 diabetes mellitus ([place]) 01/30/2020  ? Hepatic steatosis 06/27/2019  ? History of behavioral and mental health problems 06/27/2019  ? Abnormal finding on CT scan 06/27/2019  ? Abdominal bloating 05/16/2019  ? Epigastric pain 05/16/2019  ? Personal history of malignant carcinoid tumor of rectum 05/16/2019  ? Mild nonproliferative diabetic retinopathy of left eye with macular edema associated with type 2 diabetes mellitus ([place]) 05/15/2019  ? Incomplete uterine prolapse 12/08/2017  ? Cystocele, midline 12/08/2017  ? LVH (left ventricular hypertrophy) due to hypertensive disease, without heart failure 06/01/2016  ? Bipolar affective disorder, manic, severe, with psychotic behavior ([place])   ? Type 2 diabetes mellitus with hyperlipidemia ([place]) 04/01/2016  ? Hyperlipidemia 04/01/2016  ? Noncompliance 03/28/2016  ? B12 deficiency 12/18/2014  ? ?PCP:  [name], MD ?Pharmacy:   ?[id number] [place]  [id number]?Phone: [phone]: [phone]? ? ? ? ?Social Determinants of Health (SDOH) Interventions ?  ? ?Readmission Risk Interventions ?   ? View : No data to display.  ?  ?  ?  ? ? ? ?

## 2022-02-05 NOTE — Assessment & Plan Note (Addendum)
Carb modified diet.  Reduce metformin to 500 mg daily ?

## 2022-02-05 NOTE — NC FL2 (Signed)
?[place] MEDICAID FL2 LEVEL OF CARE SCREENING TOOL  ?  ? ?IDENTIFICATION  ?Patient Name: ?Sabrina [name] Birthdate: Jun 19, 1946 Sex: female Admission Date (Current Location): ?02/04/2022  ?[place] and [place] Number: ? [place] ?  Facility and Address:  ?[facility], [address], [place], [place] [address] ?     Provider Number: ?[id number]  ?Attending Physician Name and Address:  ?Collier Bullock, MD ? Relative Name and Phone Number:  ?[name] Daughter [phone] ?   ?Current Level of Care: ?Hospital Recommended Level of Care: ?[place] Prior Approval Number: ?  ? ?Date Approved/Denied: ?  PASRR Number: ?[id number] A ? ?Discharge Plan: ?SNF ?  ? ?Current Diagnoses: ?Patient Active Problem List  ? Diagnosis Date Noted  ? Physical deconditioning 02/05/2022  ? AKI (acute kidney injury) ([place]) 01/30/2022  ? Metabolic acidosis, increased anion gap 01/29/2022  ? Ascites 01/29/2022  ? Stage IV adenocarcinoma of endometrium ([place]) 01/29/2022  ? Endometrial cancer ([place]) 01/29/2022  ? Microcytic anemia 01/28/2022  ? Postmenopausal bleeding 01/27/2022  ? Hyponatremia 01/10/2022  ? GERD (gastroesophageal reflux disease) 01/08/2022  ? Elevated troponin 01/05/2022  ? Diabetes mellitus without complication ([place]) 53/61/4431  ? Acute saddle pulmonary embolism ([place]) 01/05/2022  ? Abnormal finding present on diagnostic imaging of uterus   ? Generalized weakness   ? History of stroke 03/02/2020  ? Leg weakness, bilateral 03/02/2020  ? Slurred speech 03/02/2020  ? Transient confusion 03/02/2020  ? Acute CVA (cerebrovascular accident) ([place]) 01/31/2020  ? Hyperglycemia due to type 2 diabetes mellitus ([place]) 01/30/2020  ? Hepatic steatosis 06/27/2019  ? History of behavioral and mental health problems 06/27/2019  ? Abnormal finding on CT scan 06/27/2019  ? Abdominal bloating 05/16/2019  ? Epigastric pain 05/16/2019  ? Personal history of malignant carcinoid tumor of rectum 05/16/2019  ? Mild  nonproliferative diabetic retinopathy of left eye with macular edema associated with type 2 diabetes mellitus ([place]) 05/15/2019  ? Incomplete uterine prolapse 12/08/2017  ? Cystocele, midline 12/08/2017  ? LVH (left ventricular hypertrophy) due to hypertensive disease, without heart failure 06/01/2016  ? Bipolar affective disorder, manic, severe, with psychotic behavior ([place])   ? Type 2 diabetes mellitus with hyperlipidemia ([place]) 04/01/2016  ? Hyperlipidemia 04/01/2016  ? Noncompliance 03/28/2016  ? B12 deficiency 12/18/2014  ? ? ?Orientation RESPIRATION BLADDER Height & Weight   ?  ?Self, Time, Situation, Place ? Normal Continent Weight: 57.6 kg ?Height:     ?BEHAVIORAL SYMPTOMS/MOOD NEUROLOGICAL BOWEL NUTRITION STATUS  ?    Continent Diet (regular)  ?AMBULATORY STATUS COMMUNICATION OF NEEDS Skin   ?Extensive Assist Verbally Normal ?  ?  ?  ?    ?     ?     ? ? ?Personal Care Assistance Level of Assistance  ?Bathing, Feeding, Dressing Bathing Assistance: Limited assistance ?Feeding assistance: Limited assistance ?Dressing Assistance: Maximum assistance ?   ? ?Functional Limitations Info  ?Sight, Hearing, Speech Sight Info: Adequate ?Hearing Info: Adequate ?Speech Info: Adequate  ? ? ?SPECIAL CARE FACTORS FREQUENCY  ?PT (By licensed PT), OT (By licensed OT)   ?  ?PT Frequency: 5 times per week ?OT Frequency: 5 times per week ?  ?  ?  ?   ? ? ?Contractures Contractures Info: Not present  ? ? ?Additional Factors Info  ?Code Status, Allergies Code Status Info: Full Code ?Allergies Info: Glimepiride, Glipizide, Lisinopril, Losartan, Norco, Pioglitazone, PCN ?  ?  ?  ?   ? ?Current Medications (02/05/2022):  This is the current  hospital active medication list ?Current Facility-Administered Medications  ?Medication Dose Route Frequency Provider Last Rate Last Admin  ? 0.9 %  sodium chloride infusion  250 mL Intravenous PRN Agbata, Tochukwu, MD      ? apixaban (ELIQUIS) tablet 5 mg  5 mg Oral BID Vladimir Crofts, MD   5 mg at  02/05/22 0932  ? insulin aspart (novoLOG) injection 0-15 Units  0-15 Units Subcutaneous TID WC Agbata, Tochukwu, MD   2 Units at 02/05/22 1238  ? OLANZapine zydis (ZYPREXA) disintegrating tablet 2.5 mg  2.5 mg Oral BID Sherlon Handing, NP      ? Or  ? OLANZapine (ZYPREXA) injection 2.5 mg  2.5 mg Intramuscular BID Waldon Merl F, NP      ? ondansetron (ZOFRAN) tablet 4 mg  4 mg Oral Q6H PRN Agbata, Tochukwu, MD      ? Or  ? ondansetron (ZOFRAN) injection 4 mg  4 mg Intravenous Q6H PRN Agbata, Tochukwu, MD      ? pantoprazole (PROTONIX) EC tablet 40 mg  40 mg Oral Daily Vladimir Crofts, MD   40 mg at 02/05/22 0931  ? polyethylene glycol (MIRALAX / GLYCOLAX) packet 17 g  17 g Oral Daily PRN Agbata, Tochukwu, MD      ? propranolol ER (INDERAL LA) 24 hr capsule 60 mg  60 mg Oral Daily Vladimir Crofts, MD   60 mg at 02/05/22 0932  ? sodium chloride flush (NS) 0.9 % injection 3 mL  3 mL Intravenous Q12H Agbata, Tochukwu, MD      ? sodium chloride flush (NS) 0.9 % injection 3 mL  3 mL Intravenous PRN Agbata, Tochukwu, MD      ? ? ? ?Discharge Medications: ?Please see discharge summary for a list of discharge medications. ? ?Relevant Imaging Results: ? ?Relevant Lab Results: ? ? ?Additional Information ?[id number] ? ?Shelbie Hutching, RN ? ? ? ? ?

## 2022-02-05 NOTE — ED Notes (Addendum)
Pt assisted to bedside commode x2 people Karena Addison, Therapist, sports and this EDT). Pt had a bowl movement. ?

## 2022-02-05 NOTE — Assessment & Plan Note (Addendum)
Status post thrombolysis and mechanical thrombectomy.  ?Continue apixaban for anticoagulation. ?

## 2022-02-05 NOTE — Assessment & Plan Note (Addendum)
Patient appears very weak and deconditioned from prior hospitalization. ?Only requires 2 person assist to get out of bed. ?PT and OT recommended SNF. ?SNF authorization initiated.  Patient and family is agreeable. ?

## 2022-02-05 NOTE — ED Notes (Signed)
Hospitalist at bedside 

## 2022-02-05 NOTE — BH Assessment (Signed)
Attempted to assess patient with Psyc NP, patient continues to be sleeping ?

## 2022-02-05 NOTE — ED Notes (Signed)
Informed RN bed assigned 

## 2022-02-05 NOTE — H&P (Signed)
?History and Physical  ? ? ?Patient: Sabrina [name]:[id number] DOB: October 13, 1946 ?DOA: 02/04/2022 ?DOS: the patient was seen and examined on 02/05/2022 ?PCP: Sabrina Crouch, MD  ?Patient coming from: Home ? ?Chief Complaint:  ?Chief Complaint  ?Patient presents with  ? Altered Mental Status  ? Acute Renal Failure  ? ?HPI: Sabrina [name] is [name] 76 y.o. female with medical history significant for diabetes mellitus, history of acute pulmonary embolism status post thrombectomy, UTI, dementia, stage IV adenocarcinoma of endometrium, history of bipolar affective disorder, GERD who was recently discharged from the hospital after management for acute kidney injury thought to be secondary to possible hepatorenal syndrome. ?Patient was discharged home and was sent back to the ER by EMS for evaluation because of aggressive behavior and confusion.  Chart review shows patient had similar episodes while hospitalized but was lucid upon discharge. ?Patient was very agitated in the ER and IVC papers were taken out on her. ?She was seen and evaluated by psychiatry and did not meet criteria for admission. ?Patient appears very weak and requires [name] two-person assist to get out of bed. ?She denies having any chest pain, no abdominal pain, no changes in her bowel habits, no urinary symptoms, no nausea, no vomiting, no headache, no blurred vision or any focal deficit. ?Review of Systems: As mentioned in the history of present illness. All other systems reviewed and are negative. ?Past Medical History:  ?Diagnosis Date  ? Diabetes mellitus without complication ([place])   ? Endometrial cancer ([place]) 01/29/2022  ? High cholesterol   ? Hypertension   ? ?Past Surgical History:  ?Procedure Laterality Date  ? DILATION AND CURETTAGE OF UTERUS    ? POLYPECTOMY    ? PULMONARY THROMBECTOMY Bilateral 01/08/2022  ? Procedure: PULMONARY THROMBECTOMY;  Surgeon: Sabrina Huxley, MD;  Location: [facility];  Service: Cardiovascular;  Laterality: Bilateral;   ? ?Social History:  reports that she has never smoked. She has never used smokeless tobacco. She reports that she does not drink alcohol and does not use drugs. ? ?Allergies  ?Allergen Reactions  ? Glimepiride Diarrhea and Other (See Comments)  ? Glipizide Other (See Comments)  ? Lisinopril Other (See Comments)  ?  headache  ? Losartan Other (See Comments)  ?  Headache  ? Norco [Hydrocodone-Acetaminophen] Other (See Comments)  ?  Mood changes  ? Pioglitazone Swelling  ?  On legs  ? Penicillins Diarrhea and Rash  ? ? ?Family History  ?Problem Relation Age of Onset  ? Breast cancer Maternal Aunt   ?     2 aunts in their 59's  ? Breast cancer Maternal Aunt 66  ? Ovarian cancer Neg Hx   ? Colon cancer Neg Hx   ? ? ?Prior to Admission medications   ?Medication Sig Start Date End Date Taking? Authorizing Provider  ?apixaban (ELIQUIS) 5 MG TABS tablet Take one tablet (5 mg) by mouth twice daily 02/03/22  Yes Sabrina Kindred A, DO  ?metFORMIN (GLUCOPHAGE) 500 MG tablet Take 1,000 mg by mouth 2 (two) times daily. 09/10/21  Yes [provider]  ?pantoprazole (PROTONIX) 40 MG tablet Take 1 tablet (40 mg total) by mouth daily. 01/11/22  Yes Wieting, Richard, MD  ?propranolol ER (INDERAL LA) 60 MG 24 hr capsule Take 60 mg by mouth daily.   Yes [provider]  ?spironolactone (ALDACTONE) 25 MG tablet Take 25 mg by mouth daily.   Yes [provider]  ?Accu-Chek Softclix Lancets lancets  01/23/22  [provider]  ?Accu-Chek Softclix Lancets lancets Use to check blood sugar twice [name] day as directed. 01/23/22   [provider]  ?furosemide (LASIX) 20 MG tablet Take by mouth. ?Patient not taking: Reported on 01/28/2022 01/20/22 01/20/23  [provider]  ?ondansetron (ZOFRAN-ODT) 4 MG disintegrating tablet Take by mouth. 01/04/22   [provider]  ?polyethylene glycol (MIRALAX / GLYCOLAX) 17 g packet Take 17 g by mouth daily as needed for moderate constipation. 01/11/22    Sabrina Grayer, MD  ? ? ?Physical Exam: ?Vitals:  ? 02/04/22 1700 02/04/22 1730 02/04/22 2000 02/05/22 0727  ?BP: 107/62 (!) 109/59 (!) 106/59 118/62  ?Pulse: 89 89 90 80  ?Resp: '14 13 14 16  '$ ?Temp:   97.7 ?F (36.5 ?C) 98.4 ?F (36.9 ?C)  ?TempSrc:   Oral Oral  ?SpO2: 96% 96%  100%  ?Weight:      ? ?Physical Exam ?Vitals and nursing note reviewed.  ?Constitutional:   ?   Appearance: Normal appearance.  ?HENT:  ?   Head: Normocephalic.  ?   Nose: Nose normal.  ?   Mouth/Throat:  ?   Mouth: Mucous membranes are moist.  ?Eyes:  ?   Comments: Pale conjunctiva  ?Cardiovascular:  ?   Rate and Rhythm: Normal rate.  ?Pulmonary:  ?   Effort: Pulmonary effort is normal.  ?   Breath sounds: Normal breath sounds.  ?Abdominal:  ?   General: Bowel sounds are normal.  ?   Palpations: Abdomen is soft.  ?   Comments: Central adiposity  ?Musculoskeletal:     ?   General: Normal range of motion.  ?   Cervical back: Normal range of motion and neck supple.  ?Skin: ?   General: Skin is warm and dry.  ?Neurological:  ?   General: No focal deficit present.  ?   Mental Status: She is alert and oriented to person, place, and time.  ?Psychiatric:     ?   Mood and Affect: Mood normal.     ?   Behavior: Behavior normal.  ? ? ?Data Reviewed: ?Relevant notes from primary care and specialist visits, past discharge summaries as available in EHR, including Care Everywhere. ?Prior diagnostic testing as pertinent to current admission diagnoses ?Updated medications and problem lists for reconciliation ?ED course, including vitals, labs, imaging, treatment and response to treatment ?Triage notes, nursing and pharmacy notes and ED provider's notes ?Notable results as noted in HPI ?Labs reviewed.  BUN 25, creatinine 1.90 compared to baseline of 1.1, calcium 8.7, ?Urine drug screen is negative ?CT scan of the head without contrast shows no acute findings ?Twelve-lead EKG reviewed by me shows sinus rhythm with short PR interval. ?There are no new results  to review at this time. ? ?Assessment and Plan: ?AKI (acute kidney injury) ([place]) ?At baseline patient has [name] serum creatinine of 1.1 and it peaked at 3.7 ? during her last hospitalization ?Renal function continues to improve but not back to baseline yet ?Monitor closely during this hospitalization ?Hold diuretic therapy ? ?Bipolar affective disorder, manic, severe, with psychotic behavior ([place]) ?Patient presents to the ER for evaluation of aggressive behavior and confusion ?Received [name] dose of Haldol and midazolam in the ER ?Currently on Zyprexa 2.5 mg at bedtime ?Behavioral health consult for further recommendation ? ?Acute saddle pulmonary embolism (HCC) ?Status post thrombolysis and mechanical thrombectomy ?Continue apixaban ? ?Stage IV adenocarcinoma of endometrium ([place]) ?Come firmedPeritoneal fluid cytology high-grade serous carcinoma ?Oncology and palliative care  were consulted but patient declined chemotherapy ?Chemotherapy would be the recommendation if patient decides to pursue treatment. ?Follow-up with oncology as an outpatient ? ?GERD (gastroesophageal reflux disease) ?Stable ?Continue oral PPI ? ?Physical deconditioning ?Patient is very weak and physical deconditioning from her last hospitalization ?She currently requires [name] two-person assist to get out of bed ?We will request PT evaluation ?She will most likely require discharge to subacute rehab ? ?Diabetes mellitus without complication ([place]) ?Hold metformin ?Place patient on consistent carbohydrate diet ?Glycemic control with sliding scale insulin  ? ? ? ? ? Advance Care Planning:   Code Status: Full Code  ? ?Consults: Physical therapy/TOC ? ?Family Communication: Greater than 50% of time was spent discussing patient's condition and plan of care with her at the bedside.  All questions and concerns have been addressed ? ?Severity of Illness: ?The appropriate patient status for this patient is OBSERVATION. Observation status is judged to be reasonable and  necessary in order to provide the required intensity of service to ensure the patient's safety. The patient's presenting symptoms, physical exam findings, and initial radiographic and laboratory data in the

## 2022-02-05 NOTE — ED Notes (Signed)
VOLUNTARY as IVC was rescinded by Dr Vladimir Crofts ?

## 2022-02-05 NOTE — Assessment & Plan Note (Addendum)
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 ?Chemotherapy would be the recommendation if patient decides to pursue treatment. ?Follow-up with oncology as an outpatient. ?

## 2022-02-05 NOTE — Assessment & Plan Note (Addendum)
Continue Protonix °

## 2022-02-06 DIAGNOSIS — R5381 Other malaise: Secondary | ICD-10-CM | POA: Diagnosis not present

## 2022-02-06 DIAGNOSIS — F312 Bipolar disorder, current episode manic severe with psychotic features: Secondary | ICD-10-CM | POA: Diagnosis not present

## 2022-02-06 LAB — BASIC METABOLIC PANEL
Anion gap: 11 (ref 5–15)
BUN: 24 mg/dL — ABNORMAL HIGH (ref 8–23)
CO2: 23 mmol/L (ref 22–32)
Calcium: 8.9 mg/dL (ref 8.9–10.3)
Chloride: 103 mmol/L (ref 98–111)
Creatinine, Ser: 1.72 mg/dL — ABNORMAL HIGH (ref 0.44–1.00)
GFR, Estimated: 31 mL/min — ABNORMAL LOW (ref >60)
Glucose, Bld: 91 mg/dL (ref 70–99)
Potassium: 3.9 mmol/L (ref 3.5–5.1)
Sodium: 137 mmol/L (ref 135–145)

## 2022-02-06 LAB — CBC
HCT: 30.3 % — ABNORMAL LOW (ref 36.0–46.0)
Hemoglobin: 9.4 g/dL — ABNORMAL LOW (ref 12.0–15.0)
MCH: 27.2 pg (ref 26.0–34.0)
MCHC: 31 g/dL (ref 30.0–36.0)
MCV: 87.8 fL (ref 80.0–100.0)
Platelets: 432 10*3/uL — ABNORMAL HIGH (ref 150–400)
RBC: 3.45 MIL/uL — ABNORMAL LOW (ref 3.87–5.11)
RDW: 14.6 % (ref 11.5–15.5)
WBC: 4.8 10*3/uL (ref 4.0–10.5)
nRBC: 0 % (ref 0.0–0.2)

## 2022-02-06 LAB — GLUCOSE, CAPILLARY
Glucose-Capillary: 94 mg/dL (ref 70–99)
Glucose-Capillary: 112 mg/dL — ABNORMAL HIGH (ref 70–99)
Glucose-Capillary: 127 mg/dL — ABNORMAL HIGH (ref 70–99)
Glucose-Capillary: 88 mg/dL (ref 70–99)

## 2022-02-06 NOTE — TOC Progression Note (Signed)
Transition of Care (TOC) - Progression Note  ? ? ?Patient Details  ?Name: Sabrina [name] ?MRN: [id number] ?Date of Birth: [birth year] ? ?Transition of Care (TOC) CM/SW Contact  ?Laurena Slimmer, RN ?Phone Number: ?02/06/2022, 3:02 PM ? ?Clinical Narrative:    ?Confirmed no bed offer from [facility] or [facility]. Bed offer received from [facility]. Spoke with Daughter by phone while in patient's room. Patient advised of bed offer. Patient accepted. Advised patient and daughter once medically stable for discharge and authorization was given she would have to discharge to bed at [facility].  ? ? ?Expected Discharge Plan: [place] ?Barriers to Discharge: SNF Pending bed offer, Continued Medical Work up ? ?Expected Discharge Plan and Services ?Expected Discharge Plan: [place] ?  ?Discharge Planning Services: CM Consult ?Post Acute Care Choice: [place] ?Living arrangements for the past 2 months: [place] ?                ?DME Arranged: N/A ?DME Agency: NA ?  ?  ?  ?HH Arranged: PT, OT ?[place] Agency: [place] ?  ?  ?  ? ? ?Social Determinants of Health (SDOH) Interventions ?  ? ?Readmission Risk Interventions ?   ? View : No data to display.  ?  ?  ?  ? ? ?

## 2022-02-06 NOTE — TOC Progression Note (Signed)
Transition of Care (TOC) - Progression Note  ? ? ?Patient Details  ?Name: Sabrina [name] ?MRN: [id number] ?Date of Birth: [birth year] ? ?Transition of Care (TOC) CM/SW Contact  ?Beverly Sessions, RN ?Phone Number: ?02/06/2022, 3:36 PM ? ?Clinical Narrative:    ?Per MD patient anticipated medically ready tomorrow.  Patient not managed by [facility].  Updated Hilda Blades at [facility] and requested she started [place] today  ? ? ?Expected Discharge Plan: [place] ?Barriers to Discharge: SNF Pending bed offer, Continued Medical Work up ? ?Expected Discharge Plan and Services ?Expected Discharge Plan: [place] ?  ?Discharge Planning Services: CM Consult ?Post Acute Care Choice: [place] ?Living arrangements for the past 2 months: [place] ?                ?DME Arranged: N/A ?DME Agency: NA ?  ?  ?  ?HH Arranged: PT, OT ?[place] Agency: [place] ?  ?  ?  ? ? ?Social Determinants of Health (SDOH) Interventions ?  ? ?Readmission Risk Interventions ?   ? View : No data to display.  ?  ?  ?  ? ? ?

## 2022-02-06 NOTE — Progress Notes (Signed)
Physical Therapy Treatment ?Patient Details ?Name: Sabrina [name] ?MRN: [id number] ?DOB: July 09, 1946 ?Today's Date: 02/06/2022 ? ? ?History of Present Illness Patient is a 76 y.o. female  female with medical history significant for type 2 diabetes, hyperlipidemia, hypertension, recently diagnosed saddle PE status post thrombectomy 3 weeks ago, now on Eliquis, CVA, postmenopausal bleeding with concern for endometrial cancer recent hospitalization with discharge home yesterday for management of an AKI who presents from home by daughter with concerns for worsening altered mental status and aggressiveness and confusion. ? ?  ?PT Comments  ? ? Patient alert, agreeable to PT with motivation. Reported R leg pain, groin pain, that also involved L leg. She was able to perform a few supine exercises with tactile and verbal cues. Supine <> sit minA to assist with LE movement. She was able to EOB with fair balance and brush her teeth with set up and supervision. Sit <> stand with CGA and RW, pt adamant about doing it without assistance. She did take one step to the L towards [facility] but declined any further ambulation.Pt in bed with all needs in reach at end of session. The patient would benefit from further skilled PT intervention to continue to progress towards goals. Recommendation remains appropriate.  ?  ? ? ?    ?Recommendations for follow up therapy are one component of a multi-disciplinary discharge planning process, led by the attending physician.  Recommendations may be updated based on patient status, additional functional criteria and insurance authorization. ? ?Follow Up Recommendations ? Skilled nursing-short term rehab (<3 hours/day) ?  ?  ?Assistance Recommended at Discharge Frequent or constant Supervision/Assistance  ?Patient can return home with the following A little help with walking and/or transfers;A little help with bathing/dressing/bathroom;Assistance with cooking/housework;Help with stairs or ramp for  entrance ?  ?Equipment Recommendations ? None recommended by PT  ?  ?Recommendations for Other Services   ? ? ?  ?Precautions / Restrictions Precautions ?Precautions: Fall ?Precaution Comments: watch HR! ?Restrictions ?Weight Bearing Restrictions: No  ?  ? ?Mobility ? Bed Mobility ?Overal bed mobility: Needs Assistance ?Bed Mobility: Supine to Sit, Sit to Supine ?  ?  ?Supine to sit: Min assist ?Sit to supine: Min assist ?  ?General bed mobility comments: LE assist ?  ? ?Transfers ?Overall transfer level: Needs assistance ?Equipment used: Rolling walker (2 wheels) ?Transfers: Sit to/from Stand ?Sit to Stand: Min guard ?  ?  ?  ?  ?  ?  ?  ? ?Ambulation/Gait ?  ?  ?  ?  ?  ?  ?  ?General Gait Details: Pt declined ambulation, able to take one step towards [facility], refused further mobility ? ? ?Stairs ?  ?  ?  ?  ?  ? ? ?Wheelchair Mobility ?  ? ?Modified Rankin (Stroke Patients Only) ?  ? ? ?  ?Balance Overall balance assessment: Needs assistance ?Sitting-balance support: Feet supported, No upper extremity supported ?Sitting balance-Leahy Scale: Fair ?  ?  ?Standing balance support: Bilateral upper extremity supported ?Standing balance-Leahy Scale: Fair ?  ?  ?  ?  ?  ?  ?  ?  ?  ?  ?  ?  ?  ? ?  ?Cognition Arousal/Alertness: Awake/alert ?Behavior During Therapy: [facility] for tasks assessed/performed ?Overall Cognitive Status: No family/caregiver present to determine baseline cognitive functioning ?  ?  ?  ?  ?  ?  ?  ?  ?  ?  ?  ?  ?  ?  ?  ?  ?  ?  ?  ? ?  ?  Exercises   ? ?  ?General Comments   ?  ?  ? ?Pertinent Vitals/Pain Pain Assessment ?Pain Assessment: 0-10 ?Pain Score: 8  ?Pain Location: R leg groin pain, across to L ?Pain Descriptors / Indicators: Aching ?Pain Intervention(s): Limited activity within patient's tolerance, Monitored during session, Repositioned  ? ? ?Home Living   ?  ?  ?  ?  ?  ?  ?  ?  ?  ?   ?  ?Prior Function    ?  ?  ?   ? ?PT Goals (current goals can now be found in the care plan section) Progress  towards PT goals: Progressing toward goals ? ?  ?Frequency ? ? ? Min 2X/week ? ? ? ?  ?PT Plan Current plan remains appropriate  ? ? ?Co-evaluation   ?  ?  ?  ?  ? ?  ?AM-PAC PT "6 Clicks" Mobility   ?Outcome Measure ? Help needed turning from your back to your side while in a flat bed without using bedrails?: A Little ?Help needed moving from lying on your back to sitting on the side of a flat bed without using bedrails?: A Little ?Help needed moving to and from a bed to a chair (including a wheelchair)?: A Little ?Help needed standing up from a chair using your arms (e.g., wheelchair or bedside chair)?: A Little ?Help needed to walk in hospital room?: A Lot ?Help needed climbing 3-5 steps with a railing? : A Lot ?6 Click Score: 16 ? ?  ?End of Session Equipment Utilized During Treatment: Gait belt ?Activity Tolerance: Patient tolerated treatment well ?Patient left: in bed;with call bell/phone within reach;with bed alarm set ?Nurse Communication: Mobility status ?PT Visit Diagnosis: Unsteadiness on feet (R26.81);Muscle weakness (generalized) (M62.81) ?  ? ? ?Time: 7782-4235 ?PT Time Calculation (min) (ACUTE ONLY): 20 min ? ?Charges:  $Therapeutic Activity: 8-22 mins          ?          ? ?Lieutenant Diego PT, DPT ?3:25 PM,02/06/22 ? ? ?

## 2022-02-06 NOTE — Care Management Obs Status (Signed)
MEDICARE OBSERVATION STATUS NOTIFICATION ? ? ?Patient Details  ?Name: Sabrina [name] ?MRN: [id number] ?Date of Birth: July 21, 1946 ? ? ?Medicare Observation Status Notification Given:  No (VM left for daughter awaiting return call) ? ? ? ?Beverly Sessions, RN ?02/06/2022, 11:43 AM ?

## 2022-02-06 NOTE — TOC Progression Note (Signed)
Transition of Care (TOC) - Progression Note  ? ? ?Patient Details  ?Name: Sabrina [name] ?MRN: [id number] ?Date of Birth: 1946-09-12 ? ?Transition of Care (TOC) CM/SW Contact  ?Shelbie Hutching, RN ?Phone Number: ?02/06/2022, 10:38 AM ? ?Clinical Narrative:    ?[facility] has declined patient, called and left a message for Sharyn Lull in admissions to see if they would reconsider.  Talked with Lavella Lemons at [facility], it is a no from them, apparently the patient used to work there a long time back so it would not be a good fit.  ? ?Sharyn Lull from [facility] called back and they are not able to offer a bed, they are concerned about her behaviors and refusing care. ? ? ?Expected Discharge Plan: [place] ?Barriers to Discharge: SNF Pending bed offer, Continued Medical Work up ? ?Expected Discharge Plan and Services ?Expected Discharge Plan: [place] ?  ?Discharge Planning Services: CM Consult ?Post Acute Care Choice: [place] ?Living arrangements for the past 2 months: [place] ?                ?DME Arranged: N/A ?DME Agency: NA ?  ?  ?  ?HH Arranged: PT, OT ?[employer] Agency: [facility] ?  ?  ?  ? ? ?Social Determinants of Health (SDOH) Interventions ?  ? ?Readmission Risk Interventions ?   ? View : No data to display.  ?  ?  ?  ? ? ?

## 2022-02-06 NOTE — Progress Notes (Signed)
Occupational Therapy Treatment ?Patient Details ?Name: Sabrina [name] ?MRN: [id number] ?DOB: 02/15/1946 ?Today's Date: 02/06/2022 ? ? ?History of present illness Patient is a 76 y.o. female  female with medical history significant for type 2 diabetes, hyperlipidemia, hypertension, recently diagnosed saddle PE status post thrombectomy 3 weeks ago, now on Eliquis, CVA, postmenopausal bleeding with concern for endometrial cancer recent hospitalization with discharge home yesterday for management of an AKI who presents from home by daughter with concerns for worsening altered mental status and aggressiveness and confusion. ?  ?OT comments ? Chart reviewed to date, RN cleared pt for participation in OT tx session. Pt greeted in bed agreeable to session. Tx session targeted progressing strength and endurance for improved independent ADL task completion. Pt presents with improved tolerance for functional mobility as evidenced by amb approx 10 feet to [facility] in room, completing toileting with supervision. Improvements noted with all ADL task completion on this date. Please see further details below. HR below 100 throughout tx session. Pt is left in bed, NAD, all needs met. OT will continue to follow while admitted.   ? ?Recommendations for follow up therapy are one component of a multi-disciplinary discharge planning process, led by the attending physician.  Recommendations may be updated based on patient status, additional functional criteria and insurance authorization. ?   ?Follow Up Recommendations ? Skilled nursing-short term rehab (<3 hours/day)  ?  ?Assistance Recommended at Discharge Frequent or constant Supervision/Assistance  ?Patient can return home with the following ? Assistance with cooking/housework;Assist for transportation;Help with stairs or ramp for entrance;Direct supervision/assist for medications management;A lot of help with walking and/or transfers;A lot of help with bathing/dressing/bathroom ?  ?Equipment  Recommendations ? Other (comment) (defer to next venue of care)  ?  ?Recommendations for Other Services   ? ?  ?Precautions / Restrictions Precautions ?Precautions: Fall ?Precaution Comments: watch HR ?Restrictions ?Weight Bearing Restrictions: No  ? ? ?  ? ?Mobility Bed Mobility ?Overal bed mobility: Needs Assistance ?Bed Mobility: Supine to Sit, Sit to Supine ?  ?  ?Supine to sit: Min assist, HOB elevated ?Sit to supine: Min guard, HOB elevated ?  ?General bed mobility comments: for LEs ?  ? ?Transfers ?Overall transfer level: Needs assistance ?Equipment used: Rolling walker (2 wheels) ?Transfers: Sit to/from Stand ?Sit to Stand: Supervision ?  ?  ?  ?  ?  ?  ?  ?  ?Balance Overall balance assessment: Needs assistance ?Sitting-balance support: Feet supported, No upper extremity supported ?Sitting balance-Leahy Scale: Good ?Sitting balance - Comments: during ADL tasks ?  ?Standing balance support: Bilateral upper extremity supported ?  ?  ?  ?  ?  ?  ?  ?  ?  ?  ?  ?  ?  ?   ? ?ADL either performed or assessed with clinical judgement  ? ?ADL Overall ADL's : Needs assistance/impaired ?  ?  ?Grooming: Wash/dry hands;Wash/dry face;Standing ?  ?Upper Body Bathing: Minimal assistance;Sitting ?Upper Body Bathing Details (indicate cue type and reason): at edge of bed with MIN A for thoroughness- wash, dry, apply lotion ?Lower Body Bathing: Moderate assistance;Sit to/from stand ?Lower Body Bathing Details (indicate cue type and reason): at edge of bed with MOD A for thoroughness- wash, dry, apply lotion; STS with RW ?Upper Body Dressing : Minimal assistance;Sitting ?  ?Lower Body Dressing: Moderate assistance;Sit to/from stand ?Lower Body Dressing Details (indicate cue type and reason): to donn past feet, pt able to pull up after brief brought to knees ?Toilet  Transfer: BSC/3in1;Ambulation;Supervision/safety ?  ?Toileting- Clothing Manipulation and Hygiene: Supervision/safety;Sit to/from stand ?  ?  ?  ?Functional mobility  during ADLs: Supervision/safety;Min guard;Rolling walker (2 wheels) ?  ?  ? ?Extremity/Trunk Assessment   ?  ?  ?  ?  ?  ? ?Vision   ?  ?  ?Perception   ?  ?Praxis   ?  ? ?Cognition Arousal/Alertness: Awake/alert ?Behavior During Therapy: [facility] for tasks assessed/performed ?Overall Cognitive Status: No family/caregiver present to determine baseline cognitive functioning ?  ?  ?  ?  ?  ?  ?  ?  ?  ?  ?  ?  ?  ?  ?  ?  ?General Comments: pt oriented to self, place, not oriented to date or situation; Good one step direction following on this date ?  ?  ?   ?Exercises   ? ?  ?Shoulder Instructions   ? ? ?  ?General Comments Pt HR <100 throughout ADL completion  ? ? ?Pertinent Vitals/ Pain       Pain Assessment ?Pain Assessment: No/denies pain ? ?Home Living   ?  ?  ?  ?  ?  ?  ?  ?  ?  ?  ?  ?  ?  ?  ?  ?  ?  ?  ? ?  ?Prior Functioning/Environment    ?  ?  ?  ?   ? ?Frequency ? Min 2X/week  ? ? ? ? ?  ?Progress Toward Goals ? ?OT Goals(current goals can now be found in the care plan section) ? Progress towards OT goals: Progressing toward goals ? ?Acute Rehab OT Goals ?Patient Stated Goal: got to rehab ?OT Goal Formulation: With patient ?Time For Goal Achievement: 02/20/22 ?Potential to Achieve Goals: Good  ?Plan Frequency remains appropriate;Discharge plan needs to be updated   ? ?Co-evaluation ? ? ?   ?  ?  ?  ?  ? ?  ?AM-PAC OT "6 Clicks" Daily Activity     ?Outcome Measure ? ? Help from another person eating meals?: None ?Help from another person taking care of personal grooming?: None ?Help from another person toileting, which includes using toliet, bedpan, or urinal?: A Little ?Help from another person bathing (including washing, rinsing, drying)?: A Little ?Help from another person to put on and taking off regular upper body clothing?: A Little ?Help from another person to put on and taking off regular lower body clothing?: A Lot ?6 Click Score: 19 ? ?  ?End of Session Equipment Utilized During Treatment: Gait  belt ? ?OT Visit Diagnosis: Other abnormalities of gait and mobility (R26.89);Muscle weakness (generalized) (M62.81);History of falling (Z91.81) ?  ?Activity Tolerance Patient tolerated treatment well ?  ?Patient Left in bed;with call bell/phone within reach;with bed alarm set ?  ?Nurse Communication Mobility status ?  ? ?   ? ?Time: 2202-5427 ?OT Time Calculation (min): 32 min ? ?Charges: OT General Charges ?$OT Visit: 1 Visit ?OT Treatments ?$Self Care/Home Management : 23-37 mins ? ?Shanon Payor, OTD OTR/L  ?02/06/22, 5:43 PM  ?

## 2022-02-06 NOTE — Hospital Course (Addendum)
This 76 years old female with PMH significant for diabetes, history of acute pulmonary embolism status post thrombectomy, dementia, stage IV adenocarcinoma of endometrium, history of bipolar affective disorder, GERD who was recently discharged from hospital after management for acute kidney injury,  thought to be secondary to possible hepatorenal syndrome.  Patient was discharged home, and was sent back same day by EMS for the evaluation of aggressive behavior and confusion.  Chart review showed patient had similar episodes while hospitalized but she was lucid upon discharge.  Patient was seen by psychiatry and did not meet inpatient psych criteria for admission.  Patient appears very weak and requires two-person assist to get out of bed.  Patient is admitted for generalized weakness and aggressive behavior.  She is much calmer now.  PT and OT recommended SNF.  Insurance authorization initiated and pending.  Patient has been doing much better,  Patient is being discharged to skilled nursing facility for rehabilitation,  renal functions has improved. ?

## 2022-02-06 NOTE — Progress Notes (Signed)
?Progress Note ? ? ?Patient: Sabrina [name] FUX:[id number] DOB: 1945-11-01 DOA: 02/04/2022     0 ? ?DOS: the patient was seen and examined on 02/06/2022 ?  ?Brief hospital course: ?This 76 years old female with PMH significant for diabetes, history of acute pulmonary embolism status post thrombectomy, dementia, stage IV adenocarcinoma of endometrium, history of bipolar affective disorder, GERD who was recently discharged from hospital after management for acute kidney injury,  thought to be secondary to possible hepatorenal syndrome.  Patient was discharged home, and was sent back same day by EMS for the evaluation of aggressive behavior and confusion.  Chart review showed patient had similar episodes while hospitalized but she was lucid upon discharge.  Patient was seen by psychiatry and did not meet inpatient psych criteria for admission.  Patient appears very weak and requires two-person assist to get out of bed.  Patient was admitted for generalized weakness and aggressive behavior.  She is much calmer now.  PT and OT recommended SNF.  Insurance authorization initiated. ? ?Assessment and Plan: ?AKI (acute kidney injury) ([place]) ?Baseline serum creatinine 1.1 and it peaked at 3.7 during her last hospitalization.  Renal function continues to improve but not back to baseline yet. ?Avoid nephrotoxic medications.  Hold diuretics. ?Continue to monitor serum creatinine, continue IV hydration. ? ?Bipolar affective disorder, manic, severe, with psychotic behavior ([place]) ?Patient presented to the ER for evaluation of aggressive behavior and confusion. She has received a dose of Haldol and midazolam in the ER ?Currently on Zyprexa 2.5 mg at bedtime. ?Patient was assessed by psychiatry, does not meet inpatient criteria for admission. ?She seems much improved, calmer, cooperative, following commands. ? ?Acute saddle pulmonary embolism ([place]) ?Status post thrombolysis and mechanical thrombectomy.  ?Continue apixaban for  anticoagulation ? 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 ?Chemotherapy would be the recommendation if patient decides to pursue treatment. ?Follow-up with oncology as an outpatient. ? ?GERD (gastroesophageal reflux disease) ?Continue Protonix. ? ?Physical deconditioning ?Patient appears very weak and deconditioned from prior hospitalization. ?Only requires 2 person assist to get out of bed. ?PT and OT recommended SNF. ?SNF authorization initiated.  Patient and family is agreeable. ? ?Diabetes mellitus without complication ([place]) ?Hold metformin.  Carb modified diet. ?Continue regular insulin sliding scale. ? ? ?Subjective: Patient was seen and examined at bedside.  Overnight events noted. ?Patient seems very much calmer, cooperative, following commands.   ?She appears weak and very deconditioned. ? ?Physical Exam: ?Vitals:  ? 02/05/22 1614 02/05/22 2021 02/06/22 0347 02/06/22 0729  ?BP: (!) 111/57 115/64 101/60 113/62  ?Pulse: 73 73 81 75  ?Resp: '15 20 20 18  '$ ?Temp: 98.1 ?F (36.7 ?C) 99 ?F (37.2 ?C) 98.1 ?F (36.7 ?C) 98.6 ?F (37 ?C)  ?TempSrc:  Oral Oral Oral  ?SpO2: 97% 100% 98% 98%  ?Weight: 60.9 kg     ?Height: '5\' 2"'$  (1.575 m)     ? ?General exam: Appears comfortable, cooperative, deconditioned, not in any distress. ?Respiratory system: CTA bilaterally, no wheezing, no crackles, normal respiratory effort. ?Cardiovascular system: S1-S2 heard, regular rate and rhythm, no murmur. ?Gastrointestinal system: Abdomen is soft, nontender, nondistended, BS+ ?Central nervous system: Alert, oriented x3, no focal neurological deficits. ?Extremities: No edema, no cyanosis, no clubbing. ?Psychiatry: Mood, insight, judgment normal. ? ?Data Reviewed: ?I have Reviewed nursing notes, Vitals, and Lab results since pt's last encounter. Pertinent lab results CBC, BMP ?I have ordered  test including CBC, BMP ?I have reviewed the last note from psychiatry and hospitalist,  ?I have discussed pt's care plan and test results with patient.  ? ?Family Communication: No family at bedside ? ?Disposition: ?Status is: Observation ?The patient remains OBS appropriate and will d/c before 2 midnights. ? ?Patient admitted for AKI and aggressive behavior.  Patient was seen by psychiatry, does not meet inpatient criteria for admission.  Continue IV hydration. ? ?Anticipated discharge to SNF in 1 to 2 days. ? ? ? Planned Discharge Destination: Skilled nursing facility ? ? ? ? ?Time spent: 50 minutes ? ?Author: ?Shawna Clamp, MD ?02/06/2022 11:51 AM ? ?For on call review [email or web address].  ?

## 2022-02-06 NOTE — TOC Progression Note (Signed)
Transition of Care (TOC) - Progression Note  ? ? ?Patient Details  ?Name: Sabrina [name] ?MRN: [id number] ?Date of Birth: [birth year] ? ?Transition of Care (TOC) CM/SW Contact  ?Shelbie Hutching, RN ?Phone Number: ?02/06/2022, 10:34 AM ? ?Clinical Narrative:    ?RNCM spoke with patient's daughter last night she returned call from earlier in the day.  Daughter reports that patient did not refuse SNF last admission but that PT changed the recommendation to [facility], it did not go well when they got home.  Patient was upset about not going to SNF for rehab.  Daughter reports that the patient wants to go, ad preference is [facility].  SNF workup started bed search sent out.  ? ? ?Expected Discharge Plan: [place] ?Barriers to Discharge: SNF Pending bed offer, Continued Medical Work up ? ?Expected Discharge Plan and Services ?Expected Discharge Plan: [place] ?  ?Discharge Planning Services: CM Consult ?Post Acute Care Choice: [place] ?Living arrangements for the past 2 months: [place] ?                ?DME Arranged: N/A ?DME Agency: NA ?  ?  ?  ?HH Arranged: PT, OT ?[place] Agency: [place] ?  ?  ?  ? ? ?Social Determinants of Health (SDOH) Interventions ?  ? ?Readmission Risk Interventions ?   ? View : No data to display.  ?  ?  ?  ? ? ?

## 2022-02-06 NOTE — TOC Progression Note (Signed)
Transition of Care (TOC) - Progression Note  ? ? ?Patient Details  ?Name: Sabrina [name] ?MRN: [id number] ?Date of Birth: Aug 05, 1946 ? ?Transition of Care (TOC) CM/SW Contact  ?Laurena Slimmer, RN ?Phone Number: ?02/06/2022, 12:16 PM ? ?Clinical Narrative:    ?Spoke with patient. Brother at bedside. Patient requesting bed offer for [facility], [facility] or [facility]. [facility] is OON for her insurance. [facility] declined an offer twice as well as [facility] denial. Patient refuses other SNF and stated she would go home with [facility].  ?Spoke with daughter.Daughter stated the only way patient could come home is if 24 hours care is provided. Daughter was informed that would require private pay. Daughter stated they could not afford that. Daughter inquired about reason for denial for bed offer. Advised a facility may not offer a reason.  ?Daughter agreeable to [facility]. Contacted [facility]. Left VM regarding if decision had been made. [place] contacted. Sharyn Lull in admission on her way to [facility]. Will attempt to speak with Sharyn Lull. MOON letter provided to daughter by phone.  ?Daughter is aware once bed offer is given patient must discharge to that bed or return home with [facility] services.  ? ? ?Expected Discharge Plan: [place] ?Barriers to Discharge: SNF Pending bed offer, Continued Medical Work up ? ?Expected Discharge Plan and Services ?Expected Discharge Plan: [place] ?  ?Discharge Planning Services: CM Consult ?Post Acute Care Choice: [place] ?Living arrangements for the past 2 months: [place] ?                ?DME Arranged: N/A ?DME Agency: NA ?  ?  ?  ?HH Arranged: PT, OT ?[place] Agency: [place] ?  ?  ?  ? ? ?Social Determinants of Health (SDOH) Interventions ?  ? ?Readmission Risk Interventions ?   ? View : No data to display.  ?  ?  ?  ? ? ?

## 2022-02-07 DIAGNOSIS — Z888 Allergy status to other drugs, medicaments and biological substances status: Secondary | ICD-10-CM | POA: Diagnosis not present

## 2022-02-07 DIAGNOSIS — F0393 Unspecified dementia, unspecified severity, with mood disturbance: Secondary | ICD-10-CM | POA: Diagnosis present

## 2022-02-07 DIAGNOSIS — Z7984 Long term (current) use of oral hypoglycemic drugs: Secondary | ICD-10-CM | POA: Diagnosis not present

## 2022-02-07 DIAGNOSIS — R5381 Other malaise: Secondary | ICD-10-CM | POA: Diagnosis not present

## 2022-02-07 DIAGNOSIS — I2692 Saddle embolus of pulmonary artery without acute cor pulmonale: Secondary | ICD-10-CM | POA: Diagnosis present

## 2022-02-07 DIAGNOSIS — Z86711 Personal history of pulmonary embolism: Secondary | ICD-10-CM | POA: Diagnosis not present

## 2022-02-07 DIAGNOSIS — F312 Bipolar disorder, current episode manic severe with psychotic features: Secondary | ICD-10-CM | POA: Diagnosis present

## 2022-02-07 DIAGNOSIS — E7800 Pure hypercholesterolemia, unspecified: Secondary | ICD-10-CM | POA: Diagnosis present

## 2022-02-07 DIAGNOSIS — Z8673 Personal history of transient ischemic attack (TIA), and cerebral infarction without residual deficits: Secondary | ICD-10-CM | POA: Diagnosis not present

## 2022-02-07 DIAGNOSIS — I2510 Atherosclerotic heart disease of native coronary artery without angina pectoris: Secondary | ICD-10-CM | POA: Diagnosis present

## 2022-02-07 DIAGNOSIS — Z7901 Long term (current) use of anticoagulants: Secondary | ICD-10-CM | POA: Diagnosis not present

## 2022-02-07 DIAGNOSIS — R4182 Altered mental status, unspecified: Secondary | ICD-10-CM | POA: Diagnosis present

## 2022-02-07 DIAGNOSIS — Z79899 Other long term (current) drug therapy: Secondary | ICD-10-CM | POA: Diagnosis not present

## 2022-02-07 DIAGNOSIS — F0392 Unspecified dementia, unspecified severity, with psychotic disturbance: Secondary | ICD-10-CM | POA: Diagnosis present

## 2022-02-07 DIAGNOSIS — E119 Type 2 diabetes mellitus without complications: Secondary | ICD-10-CM | POA: Diagnosis present

## 2022-02-07 DIAGNOSIS — Z885 Allergy status to narcotic agent status: Secondary | ICD-10-CM | POA: Diagnosis not present

## 2022-02-07 DIAGNOSIS — I10 Essential (primary) hypertension: Secondary | ICD-10-CM | POA: Diagnosis present

## 2022-02-07 DIAGNOSIS — Z87440 Personal history of urinary (tract) infections: Secondary | ICD-10-CM | POA: Diagnosis not present

## 2022-02-07 DIAGNOSIS — F03911 Unspecified dementia, unspecified severity, with agitation: Secondary | ICD-10-CM | POA: Diagnosis present

## 2022-02-07 DIAGNOSIS — Z880 Allergy status to penicillin: Secondary | ICD-10-CM | POA: Diagnosis not present

## 2022-02-07 DIAGNOSIS — N179 Acute kidney failure, unspecified: Secondary | ICD-10-CM | POA: Diagnosis present

## 2022-02-07 DIAGNOSIS — K219 Gastro-esophageal reflux disease without esophagitis: Secondary | ICD-10-CM | POA: Diagnosis present

## 2022-02-07 DIAGNOSIS — Z20822 Contact with and (suspected) exposure to covid-19: Secondary | ICD-10-CM | POA: Diagnosis present

## 2022-02-07 DIAGNOSIS — C541 Malignant neoplasm of endometrium: Secondary | ICD-10-CM | POA: Diagnosis present

## 2022-02-07 LAB — GLUCOSE, CAPILLARY
Glucose-Capillary: 135 mg/dL — ABNORMAL HIGH (ref 70–99)
Glucose-Capillary: 102 mg/dL — ABNORMAL HIGH (ref 70–99)
Glucose-Capillary: 126 mg/dL — ABNORMAL HIGH (ref 70–99)
Glucose-Capillary: 70 mg/dL (ref 70–99)
Glucose-Capillary: 115 mg/dL — ABNORMAL HIGH (ref 70–99)

## 2022-02-07 MED ORDER — ACETAMINOPHEN 325 MG PO TABS
650.0000 mg | ORAL_TABLET | Freq: Four times a day (QID) | ORAL | Status: DC | PRN
  Administered 2022-02-07 – 2022-02-09 (×2): 650 mg via ORAL
  Filled 2022-02-07 (×2): qty 2

## 2022-02-07 NOTE — TOC Progression Note (Signed)
Transition of Care (TOC) - Progression Note  ? ? ?Patient Details  ?Name: Sabrina [name] ?MRN: [id number] ?Date of Birth: Aug 12, 1946 ? ?Transition of Care (TOC) CM/SW Contact  ?Mykael Trott E Irfan Veal, LCSW ?Phone Number: ?02/07/2022, 10:07 AM ? ?Clinical Narrative:   Reached out to Neoma Laming at [facility], asked to be updated if they get insurance auth this weekend. ? ? ? ?Expected Discharge Plan: [place] ?Barriers to Discharge: SNF Pending bed offer, Continued Medical Work up ? ?Expected Discharge Plan and Services ?Expected Discharge Plan: [place] ?  ?Discharge Planning Services: CM Consult ?Post Acute Care Choice: [place] ?Living arrangements for the past 2 months: [place] ?                ?DME Arranged: N/A ?DME Agency: NA ?  ?  ?  ?HH Arranged: PT, OT ?[place] Agency: [place] ?  ?  ?  ? ? ?Social Determinants of Health (SDOH) Interventions ?  ? ?Readmission Risk Interventions ?   ? View : No data to display.  ?  ?  ?  ? ? ?

## 2022-02-07 NOTE — Progress Notes (Signed)
?Progress Note ? ? ?Patient: Sabrina [name] YOV:[id number] DOB: 18-Jun-1946 DOA: 02/04/2022     0 ? ?DOS: the patient was seen and examined on 02/07/2022 ?  ?Brief hospital course: ?This 76 years old female with PMH significant for diabetes, history of acute pulmonary embolism status post thrombectomy, dementia, stage IV adenocarcinoma of endometrium, history of bipolar affective disorder, GERD who was recently discharged from hospital after management for acute kidney injury,  thought to be secondary to possible hepatorenal syndrome.  Patient was discharged home, and was sent back same day by EMS for the evaluation of aggressive behavior and confusion.  Chart review showed patient had similar episodes while hospitalized but she was lucid upon discharge.  Patient was seen by psychiatry and did not meet inpatient psych criteria for admission.  Patient appears very weak and requires two-person assist to get out of bed.  Patient is admitted for generalized weakness and aggressive behavior.  She is much calmer now.  PT and OT recommended SNF.  Insurance authorization initiated. ? ?Assessment and Plan: ?AKI (acute kidney injury) ([place]) ?Baseline serum creatinine 1.1 and it peaked at 3.7 during her last hospitalization.  Renal functions continues to improve but not back to baseline yet. ?Avoid nephrotoxic medications.  Hold diuretics. ?Continue to monitor serum creatinine, continue IV hydration. ? ?Bipolar affective disorder, manic, severe, with psychotic behavior ([place]) ?Patient presented to the ER for evaluation of aggressive behavior and confusion. She has received a dose of Haldol and midazolam in the ER ?Currently on Zyprexa 2.5 mg at bedtime. ?Patient was assessed by psychiatry, does not meet inpatient criteria for admission. ?She seems much improved, calmer, cooperative, following commands. ? ?Acute saddle pulmonary embolism ([place]) ?Status post thrombolysis and mechanical thrombectomy.  ?Continue apixaban for  anticoagulation ? 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 ?Chemotherapy would be the recommendation if patient decides to pursue treatment. ?Follow-up with oncology as an outpatient. ? ?GERD (gastroesophageal reflux disease) ?Continue Protonix. ? ?Physical deconditioning ?Patient appears very weak and deconditioned from prior hospitalization. ?Only requires 2 person assist to get out of bed. ?PT and OT recommended SNF. ?SNF authorization initiated.  Patient and family is agreeable. ? ?Diabetes mellitus without complication ([place]) ?Hold metformin.  Carb modified diet. ?Continue regular insulin sliding scale. ? ? ?Subjective: Patient was seen and examined at bedside.  Overnight events noted. ?Patient seems very much improved.  Much calmer, Cooperative and following commands. ?She appears very weak and deconditioned.  Awaiting insurance authorization for SNF placement. ? ?Physical Exam: ?Vitals:  ? 02/06/22 1504 02/06/22 2041 02/07/22 0439 02/07/22 0731  ?BP: (!) 96/52 103/61 110/67 (!) 105/54  ?Pulse: 72 74 96 91  ?Resp: '18 17 16 18  '$ ?Temp: 98.4 ?F (36.9 ?C) 99.8 ?F (37.7 ?C) 98.3 ?F (36.8 ?C) 98.7 ?F (37.1 ?C)  ?TempSrc: Oral   Oral  ?SpO2: 97% 94% 96% 100%  ?Weight:      ?Height:      ? ?General exam: Appears comfortable, cooperative, following commands, not in any distress ?Respiratory system: CTA bilaterally, no wheezing, no crackles, normal respiratory effort. ?Cardiovascular system: S1-S2 heard, regular rate and rhythm, no murmur. ?Gastrointestinal system: Abdomen is soft, non tender, non distended, BS+ ?Central nervous system: Alert, oriented x 3, No focal neurological deficits. ?Extremities: No edema, no cyanosis, no clubbing. ?Psychiatry: Mood, insight, judgment normal. ? ?Data Reviewed: ?I have Reviewed nursing notes, Vitals, and Lab results since pt's  last  encounter. Pertinent lab results CBC, BMP ?I have ordered test including CBC, BMP ?I have reviewed the last note from psychiatrist, hospitalist,  ?I have discussed pt's care plan and test results with patient.  ? ?Family Communication: No family at bedside ? ?Disposition: ?Status is: Observation ?The patient remains OBS appropriate and will d/c before 2 midnights. ?  ?Patient admitted for AKI and aggressive behavior.  Patient was seen by psychiatry, does not meet inpatient criteria for admission.  Continue IV hydration.  AKI improving.  Patient is very deconditioned requiring SNF placement.  Awaiting insurance authorization ? ?Anticipated discharge to SNF in 1 to 2 days. ? ? ? Planned Discharge Destination: Skilled nursing facility ? ? ? ? ?Time spent: 35 minutes ? ?Author: ?Shawna Clamp, MD ?02/07/2022 10:37 AM ? ?For on call review [email or web address]  ?

## 2022-02-08 LAB — CBC
HCT: 31.9 % — ABNORMAL LOW (ref 36.0–46.0)
Hemoglobin: 9.8 g/dL — ABNORMAL LOW (ref 12.0–15.0)
MCH: 26.9 pg (ref 26.0–34.0)
MCHC: 30.7 g/dL (ref 30.0–36.0)
MCV: 87.6 fL (ref 80.0–100.0)
Platelets: 461 10*3/uL — ABNORMAL HIGH (ref 150–400)
RBC: 3.64 MIL/uL — ABNORMAL LOW (ref 3.87–5.11)
RDW: 14.8 % (ref 11.5–15.5)
WBC: 6 10*3/uL (ref 4.0–10.5)
nRBC: 0 % (ref 0.0–0.2)

## 2022-02-08 LAB — GLUCOSE, CAPILLARY
Glucose-Capillary: 138 mg/dL — ABNORMAL HIGH (ref 70–99)
Glucose-Capillary: 142 mg/dL — ABNORMAL HIGH (ref 70–99)
Glucose-Capillary: 134 mg/dL — ABNORMAL HIGH (ref 70–99)
Glucose-Capillary: 104 mg/dL — ABNORMAL HIGH (ref 70–99)

## 2022-02-08 LAB — BASIC METABOLIC PANEL
Anion gap: 9 (ref 5–15)
BUN: 25 mg/dL — ABNORMAL HIGH (ref 8–23)
CO2: 24 mmol/L (ref 22–32)
Calcium: 9 mg/dL (ref 8.9–10.3)
Chloride: 104 mmol/L (ref 98–111)
Creatinine, Ser: 1.5 mg/dL — ABNORMAL HIGH (ref 0.44–1.00)
GFR, Estimated: 36 mL/min — ABNORMAL LOW (ref >60)
Glucose, Bld: 106 mg/dL — ABNORMAL HIGH (ref 70–99)
Potassium: 4 mmol/L (ref 3.5–5.1)
Sodium: 137 mmol/L (ref 135–145)

## 2022-02-08 LAB — MAGNESIUM: Magnesium: 2 mg/dL (ref 1.7–2.4)

## 2022-02-08 NOTE — Plan of Care (Signed)
?  Problem: Clinical Measurements: ?Goal: Cardiovascular complication will be avoided ?Outcome: Not Progressing ?  ?Problem: Nutrition: ?Goal: Adequate nutrition will be maintained ?Outcome: Not Progressing ?  ?Problem: Activity: ?Goal: Risk for activity intolerance will decrease ?Outcome: Not Progressing ?  ?Problem: Safety: ?Goal: Ability to remain free from injury will improve ?Outcome: Not Progressing ?  ?

## 2022-02-08 NOTE — Progress Notes (Signed)
?Progress Note ? ? ?Patient: Sabrina [name] WFU:[id number] DOB: [birth year] DOA: 02/04/2022     1 ? ?DOS: the patient was seen and examined on 02/08/2022 ?  ?Brief hospital course: ?This 76 years old female with PMH significant for diabetes, history of acute pulmonary embolism status post thrombectomy, dementia, stage IV adenocarcinoma of endometrium, history of bipolar affective disorder, GERD who was recently discharged from hospital after management for acute kidney injury,  thought to be secondary to possible hepatorenal syndrome.  Patient was discharged home, and was sent back same day by EMS for the evaluation of aggressive behavior and confusion.  Chart review showed patient had similar episodes while hospitalized but she was lucid upon discharge.  Patient was seen by psychiatry and did not meet inpatient psych criteria for admission.  Patient appears very weak and requires two-person assist to get out of bed.  Patient is admitted for generalized weakness and aggressive behavior.  She is much calmer now.  PT and OT recommended SNF.  Insurance authorization initiated. ? ?Assessment and Plan: ?AKI (acute kidney injury) ([place]) ?Baseline serum creatinine 1.1 and it peaked at 3.7 during her last hospitalization.   ?Renal functions continues to improve but not back to baseline yet. 1.51 today ?Avoid nephrotoxic medications.  Hold diuretics. ?Continue to monitor serum creatinine, continue IV hydration. ? ?Bipolar affective disorder, manic, severe, with psychotic behavior ([place]) ?Patient presented to the ER for evaluation of aggressive behavior and confusion.  ?She has received a dose of Haldol and midazolam in the ER ?Currently on Zyprexa 2.5 mg at bedtime. ?Patient was assessed by psychiatry, does not meet inpatient criteria for admission. ?She seems much improved, calmer, cooperative, following commands. ? ?Acute saddle pulmonary embolism ([place]) ?Status post thrombolysis and mechanical thrombectomy.  ?Continue apixaban  for anticoagulation ? 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 ?Chemotherapy would be the recommendation if patient decides to pursue treatment. ?Follow-up with oncology as an outpatient. ? ?GERD (gastroesophageal reflux disease) ?Continue Protonix. ? ?Physical deconditioning ?Patient appears very weak and deconditioned from prior hospitalization. ?Only requires 2 person assist to get out of bed. ?PT and OT recommended SNF. ?SNF authorization initiated.  Patient and family is agreeable. ? ?Diabetes mellitus without complication ([place]) ?Hold metformin.  Carb modified diet. ?Continue regular insulin sliding scale. ? ? ?Subjective: Patient was seen and examined at bedside.  Overnight events noted. ?Patient seems very much improved.  She is much calmer, cooperative and sitting comfortably on the chair. ?She appears very weak and deconditioned.  Awaiting insurance authorization for SNF placement. ? ?Physical Exam: ?Vitals:  ? 02/07/22 1657 02/07/22 2010 02/08/22 0247 02/08/22 0828  ?BP: (!) 91/57 (!) 111/55 (!) 95/52 (!) 85/63  ?Pulse: 95 78 87 85  ?Resp: '18 18 18 16  '$ ?Temp: 98.6 ?F (37 ?C) 98.7 ?F (37.1 ?C) 98.1 ?F (36.7 ?C) (!) 97 ?F (36.1 ?C)  ?TempSrc:  Oral    ?SpO2: 99% 99% 97% 100%  ?Weight:      ?Height:      ? ?General exam: Appears comfortable, not in any acute distress.  Cooperative and following commands. ?Respiratory system: CTA bilaterally, no wheezing, no crackles, normal respiratory effort. RR 13 ?Cardiovascular system: S1-S2 heard, regular rate and rhythm, no murmur. ?Gastrointestinal system: Abdomen is soft, non tender, non distended, BS+ ?Central nervous system: Alert, oriented x 3, No focal neurological deficits. ?Extremities: No edema, no cyanosis, no clubbing. ?Psychiatry: Mood,  insight, judgment normal. ? ?Data Reviewed: ?I have Reviewed nursing notes,  Vitals, and Lab results since pt's last encounter. Pertinent lab results CBC, BMP ?I have ordered test including CBC, BMP ?I have reviewed the last note from hospitalist,  ?I have discussed pt's care plan and test results with patient.  ? ?Family Communication: No family at bedside ? ?Disposition: ?Status is: Inpatient ?Remains inpatient appropriate because: ?  ?Patient admitted for AKI and aggressive behavior.  Patient was seen by psychiatry, does not meet inpatient criteria for admission.  Continue IV hydration.  AKI improving.  Patient is very deconditioned requiring SNF placement.  Awaiting insurance authorization. ? ?Anticipated discharge to SNF in 1 to 2 days. ? ? ? Planned Discharge Destination: Skilled nursing facility ? ? ? ? ?Time spent: 35 minutes ? ?Author: ?Shawna Clamp, MD ?02/08/2022 10:19 AM ? ?For on call review [email or web address]  ?

## 2022-02-08 NOTE — TOC Progression Note (Addendum)
Transition of Care (TOC) - Progression Note  ? ? ?Patient Details  ?Name: Sabrina [name] ?MRN: [id number] ?Date of Birth: Nov 09, 1945 ? ?Transition of Care (TOC) CM/SW Contact  ?Jaaziah Schulke E Thekla Colborn, LCSW ?Phone Number: ?02/08/2022, 9:05 AM ? ?Clinical Narrative:   Attempted to reach out to Neoma Laming at [facility] again today. No response yet.  ? ?3:55- RN requested CSW call patient's daughter. Called daughter who is at bedside. Explained patient has been accepted at [facility], we are just waiting on insurance auth. CSW still has not heard back from [place] at [facility] despite multiple attempts.  ? ?Expected Discharge Plan: [place] ?Barriers to Discharge: SNF Pending bed offer, Continued Medical Work up ? ?Expected Discharge Plan and Services ?Expected Discharge Plan: [place] ?  ?Discharge Planning Services: CM Consult ?Post Acute Care Choice: [place] ?Living arrangements for the past 2 months: [place] ?                ?DME Arranged: N/A ?DME Agency: NA ?  ?  ?  ?HH Arranged: PT, OT ?[place] Agency: [place] ?  ?  ?  ? ? ?Social Determinants of Health (SDOH) Interventions ?  ? ?Readmission Risk Interventions ?   ? View : No data to display.  ?  ?  ?  ? ? ?

## 2022-02-08 NOTE — Progress Notes (Signed)
Chaplain Maggie made initial visit per pt's request to see chaplain. Pt was seated in recliner by the window when chaplain arrived. Storytelling and prayer was central to this visit. Pt relies on faith in [place] for strength and guidance. Scripture verses inform her understanding of life and bring meaning to her circumstances. Pt appreciated visiting with chaplain for supportive spiritual community. ?

## 2022-02-08 NOTE — Progress Notes (Signed)
Mobility Specialist - Progress Note ? ? ? 02/08/22 1451  ?Mobility  ?Activity Ambulated with assistance in hallway;Stood at bedside;Transferred from bed to chair;Dangled on edge of bed  ?Level of Assistance Modified independent, requires aide device or extra time  ?Assistive Device Front wheel walker  ?Distance Ambulated (ft) 60 ft  ?Activity Response Tolerated well  ?$Mobility charge 1 Mobility  ? ? ?Pre-mobility: 75 HR,  97% SpO2 ?During mobility: 89 HR, 98%  SpO2 ? ?Pt sitting in chair upon arrival using RA. Pt insists on not being touched throughout d/t not want to fall from assistance. Pt completes STS ModI + extra time and ambulates 59f with supervision --- pt initiated returned to bed and completes needs MinA for LE support to lay supine. Pt left with needs in reach. ? ?MMerrily Brittle?Mobility Specialist ?02/08/22, 2:56 PM ? ? ? ? ?

## 2022-02-09 ENCOUNTER — Other Ambulatory Visit: Payer: Self-pay

## 2022-02-09 ENCOUNTER — Telehealth: Payer: Self-pay

## 2022-02-09 LAB — HEMOGLOBIN AND HEMATOCRIT, BLOOD
HCT: 31.6 % — ABNORMAL LOW (ref 36.0–46.0)
Hemoglobin: 9.8 g/dL — ABNORMAL LOW (ref 12.0–15.0)

## 2022-02-09 LAB — GLUCOSE, CAPILLARY
Glucose-Capillary: 128 mg/dL — ABNORMAL HIGH (ref 70–99)
Glucose-Capillary: 125 mg/dL — ABNORMAL HIGH (ref 70–99)
Glucose-Capillary: 158 mg/dL — ABNORMAL HIGH (ref 70–99)
Glucose-Capillary: 105 mg/dL — ABNORMAL HIGH (ref 70–99)

## 2022-02-09 LAB — CREATININE, SERUM
Creatinine, Ser: 1.6 mg/dL — ABNORMAL HIGH (ref 0.44–1.00)
GFR, Estimated: 33 mL/min — ABNORMAL LOW (ref >60)

## 2022-02-09 MED ORDER — OLANZAPINE 5 MG PO TBDP: 2.5000 mg | ORAL_TABLET | Freq: Two times a day (BID) | ORAL | 1 refills | Status: DC

## 2022-02-09 NOTE — Consult Note (Signed)
[facility] CM Inpatient Consult ? ? ?02/09/2022 ? ?Sabrina [name] ?04-26-1946 ?[id number] ? ?[place] [facility] CM) ?  ?Patient chart reviewed for less than 30 days readmission. Referral to [place] community RN care coordination after prior hospital admission. Per review, current disposition is for SNF. No [facility] CM at this time as needs will be met at SNF level of care. ? ?Of note, [facility] Care Management services does not replace or interfere with any services that are arranged by inpatient case management or social work.  ? ?Netta Cedars, MSN, RN ?[facility] Liaison ?Phone [phone] ?Toll free office [phone] ?

## 2022-02-09 NOTE — Discharge Instructions (Signed)
Advised to follow-up with primary care physician in 1 week. ?Advised to take olanzapine 2.5 mg p.o. twice daily. ?

## 2022-02-09 NOTE — Patient Outreach (Signed)
[facility]) Care Management ? ?02/09/2022 ? ?Maleena L Meara ?Dec 26, 1945 ?[id number] ? ? ? ? ?Transition of Care Referral ? ?Referral Date: 02/04/2022 ?Referral Source: [facility] Liaison ?Date of Discharge: 02/03/2022 ?Facility: [facility] ?Insurance: ? ? ? ?Referral received. Upon chart review noted pt currently inpatient and plan is for discharge to SNF. ? ? ? ?Plan: ?RN CM will continue to follow for discharge plan/disposition.  ? ? ?[name], RN,BSN,CCM ?[facility] Care Management ?Telephonic Care Management Coordinator ?Direct Phone: [phone] ?Toll Free: [phone] ?Fax: [phone] ? ?

## 2022-02-09 NOTE — Plan of Care (Signed)
?  Problem: Safety: ?Goal: Ability to remain free from injury will improve ?Outcome: Not Progressing ?  ?Problem: Activity: ?Goal: Risk for activity intolerance will decrease ?Outcome: Not Progressing ?  ?

## 2022-02-09 NOTE — Progress Notes (Signed)
?Progress Note ? ? ?Patient: Sabrina [name] SHF:[id number] DOB: [birth year] DOA: 02/04/2022     2 ? ?DOS: the patient was seen and examined on 02/09/2022 ?  ?Brief hospital course: ?This 76 years old female with PMH significant for diabetes, history of acute pulmonary embolism status post thrombectomy, dementia, stage IV adenocarcinoma of endometrium, history of bipolar affective disorder, GERD who was recently discharged from hospital after management for acute kidney injury,  thought to be secondary to possible hepatorenal syndrome.  Patient was discharged home, and was sent back same day by EMS for the evaluation of aggressive behavior and confusion.  Chart review showed patient had similar episodes while hospitalized but she was lucid upon discharge.  Patient was seen by psychiatry and did not meet inpatient psych criteria for admission.  Patient appears very weak and requires two-person assist to get out of bed.  Patient is admitted for generalized weakness and aggressive behavior.  She is much calmer now.  PT and OT recommended SNF.  Insurance authorization initiated and pending. ? ?Assessment and Plan: ?AKI (acute kidney injury) ([place]) ?Baseline serum creatinine 1.1 and it peaked at 3.7 during her last hospitalization.   ?Renal functions continues to improve but not back to baseline yet. 1.60 today ?Avoid nephrotoxic medications.  Hold diuretics. ?Continue to monitor serum creatinine, continue IV hydration. ? ?Bipolar affective disorder, manic, severe, with psychotic behavior ([place]) ?Patient presented to the ER for evaluation of aggressive behavior and confusion.  ?She has received a dose of Haldol and midazolam in the ER ?Currently on Zyprexa 2.5 mg at bedtime. ?Patient was assessed by psychiatry, does not meet inpatient criteria for admission. ?She seems much improved, calmer, cooperative, following commands. ? ?Acute saddle pulmonary embolism ([place]) ?Status post thrombolysis and mechanical thrombectomy.   ?Continue apixaban for anticoagulation. ? 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 ?Chemotherapy would be the recommendation if patient decides to pursue treatment. ?Follow-up with oncology as an outpatient. ? ?GERD (gastroesophageal reflux disease) ?Continue Protonix. ? ?Physical deconditioning ?Patient appears very weak and deconditioned from prior hospitalization. ?Only requires 2 person assist to get out of bed. ?PT and OT recommended SNF. ?SNF authorization initiated.  Patient and family is agreeable. ? ?Diabetes mellitus without complication ([place]) ?Hold metformin.  Carb modified diet. ?Continue regular insulin sliding scale. ? ? ?Subjective: Patient was seen and examined at bedside.  Overnight events noted. ?Patient seems much improved.  She appears weak and deconditioned.   ?She is awaiting insurance authorization for SNF placement. ? ? ?Physical Exam: ?Vitals:  ? 02/08/22 1448 02/08/22 1929 02/09/22 0455 02/09/22 0803  ?BP: 106/60 117/64 101/65 116/62  ?Pulse: 75 81 80 82  ?Resp: '16 16 16 18  '$ ?Temp: 98 ?F (36.7 ?C) 98.3 ?F (36.8 ?C) 98 ?F (36.7 ?C) 98.4 ?F (36.9 ?C)  ?TempSrc:  Oral Oral   ?SpO2: 100% 99% 98% 98%  ?Weight:      ?Height:      ? ?General exam: Appears comfortable, cooperative, not in any acute distress. ?Respiratory system: CTA bilaterally, no wheezing, no crackles, normal respiratory effort. RR 14 ?Cardiovascular system: S1-S2 heard, regular rate and rhythm, no murmur. ?Gastrointestinal system: Abdomen is soft, non tender, non distended, BS+ ?Central nervous system: Alert, oriented x 3, No focal neurological deficits. ?Extremities: No edema, no cyanosis, no clubbing. ?Psychiatry: Mood, insight, judgment normal. ? ?Data Reviewed: ?I have Reviewed nursing notes, Vitals, and Lab results since  pt's last encounter. Pertinent lab results CBC, BMP ?I  have ordered test including CBC, BMP ?I have reviewed the last note from hospitalist,  ?I have discussed pt's care plan and test results with patient.  ? ?Family Communication: No family at bedside ? ?Disposition: ?Status is: Inpatient ?Remains inpatient appropriate because: ?  ?Patient admitted for AKI and aggressive behavior.  Patient was seen by psychiatry, does not meet inpatient criteria for admission.  Continue IV hydration.  AKI improving.  Patient is very deconditioned requiring SNF placement.  Awaiting insurance authorization. ? ?Anticipated discharge to SNF in 1 to 2 days. ? ? ? Planned Discharge Destination: Skilled nursing facility ? ? ? ? ?Time spent: 35 minutes ? ?Author: ?Shawna Clamp, MD ?02/09/2022 12:03 PM ? ?For on call review [email or web address]  ?

## 2022-02-09 NOTE — Telephone Encounter (Signed)
Currently hospitalized. CT that was ordered by Dr. Fransisca Connors for CAP will be cancelled. CT of AP has been completed. She has follow up arranged on 4/19 with Dr. Raynelle Jan. Tasia Catchings. CT chest can be arranged during that visit if needed based on treatment plan. Notified daughter [name]. ?

## 2022-02-09 NOTE — TOC Progression Note (Signed)
Transition of Care (TOC) - Progression Note  ? ? ?Patient Details  ?Name: Sabrina [name] ?MRN: [id number] ?Date of Birth: 07-12-1946 ? ?Transition of Care (TOC) CM/SW Contact  ?Beverly Sessions, RN ?Phone Number: ?02/09/2022, 3:12 PM ? ?Clinical Narrative:    ?Insurance auth still pending.  ?Daughter requests update from MD.  MD notified  ? ? ?Expected Discharge Plan: [place] ?Barriers to Discharge: SNF Pending bed offer, Continued Medical Work up ? ?Expected Discharge Plan and Services ?Expected Discharge Plan: [place] ?  ?Discharge Planning Services: CM Consult ?Post Acute Care Choice: [place] ?Living arrangements for the past 2 months: [place] ?                ?DME Arranged: N/A ?DME Agency: NA ?  ?  ?  ?HH Arranged: PT, OT ?[place] Agency: [place] ?  ?  ?  ? ? ?Social Determinants of Health (SDOH) Interventions ?  ? ?Readmission Risk Interventions ?   ? View : No data to display.  ?  ?  ?  ? ? ?

## 2022-02-09 NOTE — TOC Progression Note (Signed)
Transition of Care (TOC) - Progression Note  ? ? ?Patient Details  ?Name: Sabrina [name] ?MRN: [id number] ?Date of Birth: [birth year] ? ?Transition of Care (TOC) CM/SW Contact  ?Laurena Slimmer, RN ?Phone Number: ?02/09/2022, 10:48 AM ? ?Clinical Narrative:    ?Per Neoma Laming at [facility]. Authorization is pending.  ? ? ?Expected Discharge Plan: [place] ?Barriers to Discharge: SNF Pending bed offer, Continued Medical Work up ? ?Expected Discharge Plan and Services ?Expected Discharge Plan: [place] ?  ?Discharge Planning Services: CM Consult ?Post Acute Care Choice: [place] ?Living arrangements for the past 2 months: [place] ?                ?DME Arranged: N/A ?DME Agency: NA ?  ?  ?  ?HH Arranged: PT, OT ?[place] Agency: [place] ?  ?  ?  ? ? ?Social Determinants of Health (SDOH) Interventions ?  ? ?Readmission Risk Interventions ?   ? View : No data to display.  ?  ?  ?  ? ? ?

## 2022-02-09 NOTE — Discharge Summary (Addendum)
?Physician Discharge Summary ?  ?Patient: Sabrina [name] MRN: [id number] DOB: [birth year]  ?Admit date:     02/04/2022  ?Discharge date: 02/10/22  ?Discharge Physician: Shawna Clamp  ? ?PCP: [name], MD  ? ?Recommendations at discharge:  ?Advised to follow-up with primary care physician in 1 week. ?Advised to take olanzapine 2.5 mg p.o. twice daily. ?Advised to continue Eliquis for anticoagulation. ?Discontinue Lasix and Aldactone. ? ?Discharge Diagnoses: ?Principal Problem: ?  Acute kidney injury ([place]) ?Active Problems: ?  AKI (acute kidney injury) ([place]) ?  Bipolar affective disorder, manic, severe, with psychotic behavior ([place]) ?  Acute saddle pulmonary embolism (H[facility]) ?  Stage IV adenocarcinoma of endometrium ([place]) ?  GERD (gastroesophageal reflux disease) ?  Diabetes mellitus without complication ([place]) ?  Physical deconditioning ? ?Resolved Problems: ?  * No resolved hospital problems. * ? ?Hospital Course: ?This 76 years old female with PMH significant for diabetes, history of acute pulmonary embolism status post thrombectomy, dementia, stage IV adenocarcinoma of endometrium, history of bipolar affective disorder, GERD who was recently discharged from hospital after management for acute kidney injury,  thought to be secondary to possible hepatorenal syndrome.  Patient was discharged home, and was sent back same day by EMS for the evaluation of aggressive behavior and confusion.  Chart review showed patient had similar episodes while hospitalized but she was lucid upon discharge.  Patient was seen by psychiatry and did not meet inpatient psych criteria for admission.  Patient appears very weak and requires two-person assist to get out of bed.  Patient is admitted for generalized weakness and aggressive behavior.  She is much calmer now.  PT and OT recommended SNF.  Insurance authorization initiated and pending.  Patient has been doing much better,  Patient is being discharged to skilled nursing facility  for rehabilitation,  renal functions has improved. ? ?Assessment and Plan: ?AKI (acute kidney injury) ([place]) ?Baseline serum creatinine 1.1 and it peaked at 3.7 during her last hospitalization.   ?Renal functions continues to improve but not back to baseline yet. 1.60 today ?Avoid nephrotoxic medications.  Continue to monitor serum creatinine. ? ?Bipolar affective disorder, manic, severe, with psychotic behavior ([place]) ?Patient presented to the ER for evaluation of aggressive behavior and confusion.  ?She has received a dose of Haldol and midazolam in the ER ?Currently on Zyprexa 2.5 mg at bedtime. ?Patient was assessed by psychiatry, does not meet inpatient criteria for admission. ?She seems much improved, calmer, cooperative, following commands. ? ?Acute saddle pulmonary embolism ([place]) ?Status post thrombolysis and mechanical thrombectomy.  ?Continue apixaban for anticoagulation. ? 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 ?Chemotherapy would be the recommendation if patient decides to pursue treatment. ?Follow-up with oncology as an outpatient. ? ?GERD (gastroesophageal reflux disease) ?Continue Protonix. ? ?Physical deconditioning ?Patient appears very weak and deconditioned from prior hospitalization. ?Only requires 2 person assist to get out of bed. ?PT and OT recommended SNF. ?SNF authorization initiated.  Patient and family is agreeable. ? ?Diabetes mellitus without complication ([place]) ?Carb modified diet.  Reduce metformin to 500 mg daily ? ? ?Pain control - [employer] Controlled Substance Reporting System database was reviewed. and patient was instructed, not to drive, operate heavy machinery, perform activities at heights, swimming or participation in water activities or provide baby-sitting services while on Pain, Sleep and Anxiety Medications; until their  outpatient Physician has advised to do so again. Also  recommended to not to take more than prescribed Pain, Sleep and Anxiety Medications.  ? ?Consultants: Psychiatry ? ?Procedures performed: None ?Disposition: Skilled nursing facility ?Diet recommendation:  ?Discharge Diet Orders (From admission, onward)  ? ?  Start     Ordered  ? 02/09/22 0000  Diet - low sodium heart healthy       ? 02/09/22 0954  ? 02/09/22 0000  Diet Carb Modified       ? 02/09/22 0954  ? ?  ?  ? ?  ? ?Cardiac diet ?DISCHARGE MEDICATION: ?Allergies as of 02/10/2022   ? ?   Reactions  ? Glimepiride Diarrhea, Other (See Comments)  ? Glipizide Other (See Comments)  ? Lisinopril Other (See Comments)  ? headache  ? Losartan Other (See Comments)  ? Headache  ? Norco [hydrocodone-acetaminophen] Other (See Comments)  ? Mood changes  ? Pioglitazone Swelling  ? On legs  ? Penicillins Diarrhea, Rash  ? ?  ? ?  ?Medication List  ?  ? ?STOP taking these medications   ? ?furosemide 20 MG tablet ?Commonly known as: LASIX ?  ?spironolactone 25 MG tablet ?Commonly known as: ALDACTONE ?  ? ?  ? ?TAKE these medications   ? ?Accu-Chek Softclix Lancets lancets ?  ?Accu-Chek Softclix Lancets lancets ?Use to check blood sugar twice a day as directed. ?  ?apixaban 5 MG Tabs tablet ?Commonly known as: ELIQUIS ?Take one tablet (5 mg) by mouth twice daily ?  ?metFORMIN 500 MG tablet ?Commonly known as: GLUCOPHAGE ?Take 2 tablets (1,000 mg total) by mouth daily. ?What changed: when to take this ?  ?OLANZapine zydis 5 MG disintegrating tablet ?Commonly known as: ZYPREXA ?Take 0.5 tablets (2.5 mg total) by mouth 2 (two) times daily. ?  ?ondansetron 4 MG disintegrating tablet ?Commonly known as: ZOFRAN-ODT ?Take by mouth. ?  ?pantoprazole 40 MG tablet ?Commonly known as: PROTONIX ?Take 1 tablet (40 mg total) by mouth daily. ?  ?polyethylene glycol 17 g packet ?Commonly known as: MIRALAX / GLYCOLAX ?Take 17 g by mouth daily as needed for moderate constipation. ?  ?propranolol  ER 60 MG 24 hr capsule ?Commonly known as: INDERAL LA ?Take 60 mg by mouth daily. ?  ? ?  ? ? Contact information for follow-up providers   ? ? [name], MD Follow up in 1 week(s).   ?Specialty: Internal Medicine ?Contact information: ?[place] [address] ?[phone] ? ? ?  ?  ? ?  ?  ? ? Contact information for after-discharge care   ? ? Destination   ? ? [facility] .   ?Service: Skilled Nursing ?Contact information: ?[address] ?[place] ?[phone] ? ?  ?  ? ?  ?  ? ?  ?  ? ?  ? ?Discharge Exam: ?Filed Weights  ? 02/04/22 1410 02/05/22 1614  ?Weight: 57.6 kg 60.9 kg  ? ? ?General exam: Appears comfortable, cooperative, not in any acute distress. ?Respiratory system: CTA bilaterally, no wheezing, no crackles, normal respiratory effort. RR 14 ?Cardiovascular system: S1-S2 heard, regular rate and rhythm, no murmur. ?Gastrointestinal system: Abdomen is soft, non tender, non distended, BS+ ?Central nervous system: Alert, oriented x 3, No focal neurological deficits. ?Extremities: No edema, no cyanosis, no clubbing. ?Psychiatry: Mood, insight, judgment normal. ? ?Condition at discharge: good ? ?The results of significant diagnostics from this hospitalization (including imaging, microbiology, ancillary and laboratory) are listed below for reference.  ? ?  Imaging Studies: ?CT ABDOMEN PELVIS WO CONTRAST ? ?Result Date: 01/29/2022 ?CLINICAL DATA:  Weakness and abdominal swelling. Increasing constipation. Last CT was abnormal concerning for uterine neoplasm, unknown what follow-up may have been done following the CT reading. There is note made of a dilatation and curettage of the uterus in the patient's surgical history but the date is unknown. Most recently the patient underwent mechanical pulmonary arterial thrombectomy on 01/08/2022. EXAM: CT ABDOMEN AND PELVIS WITHOUT CONTRAST TECHNIQUE: Multidetector CT  imaging of the abdomen and pelvis was performed following the standard protocol without IV contrast. RADIATION DOSE REDUCTION: This exam was performed according to the departmental dose-optimization pr

## 2022-02-09 NOTE — TOC Progression Note (Signed)
Transition of Care (TOC) - Progression Note  ? ? ?Patient Details  ?Name: Sabrina [name] ?MRN: [id number] ?Date of Birth: [birth year] ? ?Transition of Care (TOC) CM/SW Contact  ?Laurena Slimmer, RN ?Phone Number: ?02/09/2022, 3:51 PM ? ?Clinical Narrative:    ?Per Neoma Laming at facility. Insurance company is requesting more notes from PT/ OT for authorization. PT, Nurse and MD notified.  ? ? ?Expected Discharge Plan: [place] ?Barriers to Discharge: SNF Pending bed offer, Continued Medical Work up ? ?Expected Discharge Plan and Services ?Expected Discharge Plan: [place] ?  ?Discharge Planning Services: CM Consult ?Post Acute Care Choice: [place] ?Living arrangements for the past 2 months: [place] ?                ?DME Arranged: N/A ?DME Agency: NA ?  ?  ?  ?HH Arranged: PT, OT ?[place] Agency: [facility] ?  ?  ?  ? ? ?Social Determinants of Health (SDOH) Interventions ?  ? ?Readmission Risk Interventions ?   ? View : No data to display.  ?  ?  ?  ? ? ?

## 2022-02-10 ENCOUNTER — Ambulatory Visit: Admission: RE | Admit: 2022-02-10 | Payer: Medicare HMO | Source: Ambulatory Visit

## 2022-02-10 DIAGNOSIS — R5381 Other malaise: Secondary | ICD-10-CM | POA: Diagnosis not present

## 2022-02-10 DIAGNOSIS — N1832 Chronic kidney disease, stage 3b: Secondary | ICD-10-CM | POA: Diagnosis not present

## 2022-02-10 DIAGNOSIS — F312 Bipolar disorder, current episode manic severe with psychotic features: Secondary | ICD-10-CM | POA: Diagnosis not present

## 2022-02-10 DIAGNOSIS — E119 Type 2 diabetes mellitus without complications: Secondary | ICD-10-CM | POA: Diagnosis not present

## 2022-02-10 DIAGNOSIS — I10 Essential (primary) hypertension: Secondary | ICD-10-CM | POA: Diagnosis not present

## 2022-02-10 DIAGNOSIS — C549 Malignant neoplasm of corpus uteri, unspecified: Secondary | ICD-10-CM | POA: Diagnosis not present

## 2022-02-10 DIAGNOSIS — Z86711 Personal history of pulmonary embolism: Secondary | ICD-10-CM | POA: Diagnosis not present

## 2022-02-10 DIAGNOSIS — C541 Malignant neoplasm of endometrium: Secondary | ICD-10-CM | POA: Diagnosis not present

## 2022-02-10 DIAGNOSIS — Z7189 Other specified counseling: Secondary | ICD-10-CM | POA: Diagnosis not present

## 2022-02-10 DIAGNOSIS — K219 Gastro-esophageal reflux disease without esophagitis: Secondary | ICD-10-CM | POA: Diagnosis not present

## 2022-02-10 DIAGNOSIS — F319 Bipolar disorder, unspecified: Secondary | ICD-10-CM | POA: Diagnosis not present

## 2022-02-10 DIAGNOSIS — M6281 Muscle weakness (generalized): Secondary | ICD-10-CM | POA: Diagnosis not present

## 2022-02-10 DIAGNOSIS — I2692 Saddle embolus of pulmonary artery without acute cor pulmonale: Secondary | ICD-10-CM | POA: Diagnosis not present

## 2022-02-10 DIAGNOSIS — N179 Acute kidney failure, unspecified: Secondary | ICD-10-CM | POA: Diagnosis not present

## 2022-02-10 DIAGNOSIS — R180 Malignant ascites: Secondary | ICD-10-CM | POA: Diagnosis not present

## 2022-02-10 DIAGNOSIS — Z79899 Other long term (current) drug therapy: Secondary | ICD-10-CM | POA: Diagnosis not present

## 2022-02-10 LAB — GLUCOSE, CAPILLARY
Glucose-Capillary: 143 mg/dL — ABNORMAL HIGH (ref 70–99)
Glucose-Capillary: 100 mg/dL — ABNORMAL HIGH (ref 70–99)
Glucose-Capillary: 127 mg/dL — ABNORMAL HIGH (ref 70–99)

## 2022-02-10 MED ORDER — METFORMIN HCL 500 MG PO TABS: 1000.0000 mg | ORAL_TABLET | Freq: Every day | ORAL | 1 refills | Status: DC

## 2022-02-10 NOTE — Progress Notes (Signed)
Report given to [facility] RN.  ?

## 2022-02-10 NOTE — Care Management Important Message (Signed)
Important Message ? ?Patient Details  ?Name: Sabrina [name] ?MRN: [id number] ?Date of Birth: September 13, 1946 ? ? ?Medicare Important Message Given:  Yes ? ? ? ? ?Dannette Barbara ?02/10/2022, 11:03 AM ?

## 2022-02-10 NOTE — TOC Progression Note (Signed)
Transition of Care (TOC) - Progression Note  ? ? ?Patient Details  ?Name: MAYUKHA SYMMONDS ?MRN: [id number] ?Date of Birth: 09-Apr-1946 ? ?Transition of Care (TOC) CM/SW Contact  ?Beverly Sessions, RN ?Phone Number: ?02/10/2022, 10:58 AM ? ?Clinical Narrative:    ? ? ?Updated therapy notes sent in [place] for [facility] at [facility] to send for insurance  ? ? ?Expected Discharge Plan: [place] ?Barriers to Discharge: SNF Pending bed offer, Continued Medical Work up ? ?Expected Discharge Plan and Services ?Expected Discharge Plan: [place] ?  ?Discharge Planning Services: CM Consult ?Post Acute Care Choice: [place] ?Living arrangements for the past 2 months: [place] ?                ?DME Arranged: N/A ?DME Agency: NA ?  ?  ?  ?HH Arranged: PT, OT ?[place] Agency: [facility] ?  ?  ?  ? ? ?Social Determinants of Health (SDOH) Interventions ?  ? ?Readmission Risk Interventions ?   ? View : No data to display.  ?  ?  ?  ? ? ?

## 2022-02-10 NOTE — TOC Transition Note (Signed)
Transition of Care (TOC) - CM/SW Discharge Note ? ? ?Patient Details  ?Name: Sabrina [name] ?MRN: [id number] ?Date of Birth: 04/05/1946 ? ?Transition of Care (TOC) CM/SW Contact:  ?Beverly Sessions, RN ?Phone Number: ?02/10/2022, 3:16 PM ? ? ?Clinical Narrative:    ?Patient will DC to:[facility]  ?Anticipated DC date: 02/10/22 ?Family notified: Daughter ?Transport IF:[id number] ? ?Per MD patient ready for DC to . RN, patient, patient's family, and facility notified of DC. Discharge Summary sent to facility. RN given number for report. DC packet on chart.  ?TOC signing off. ? ?Isaias Cowman RNCM ?[phone] ? ? ? ?  ?Barriers to Discharge: SNF Pending bed offer, Continued Medical Work up ? ? ?Patient Goals and CMS Choice ?Patient states their goals for this hospitalization and ongoing recovery are:: Daughter would like patient to go to SNF ?CMS Medicare.gov Compare Post Acute Care list provided to:: Patient Represenative (must comment) ?Choice offered to / list presented to : Adult Children ? ?Discharge Placement ?  ?           ?  ?  ?  ?  ? ?Discharge Plan and Services ?  ?Discharge Planning Services: CM Consult ?Post Acute Care Choice: [place]          ?DME Arranged: N/A ?DME Agency: NA ?  ?  ?  ?HH Arranged: PT, OT ?[place] Agency: [facility] ?  ?  ?  ? ?Social Determinants of Health (SDOH) Interventions ?  ? ? ?Readmission Risk Interventions ?   ? View : No data to display.  ?  ?  ?  ? ? ? ? ? ?

## 2022-02-10 NOTE — Progress Notes (Signed)
Occupational Therapy Treatment ?Patient Details ?Name: Sabrina [name] ?MRN: [id number] ?DOB: April 26, 1946 ?Today's Date: 02/10/2022 ? ? ?History of present illness Patient is a 75 y.o. female  female with medical history significant for type 2 diabetes, hyperlipidemia, hypertension, recently diagnosed saddle PE status post thrombectomy 3 weeks ago, now on Eliquis, CVA, postmenopausal bleeding with concern for endometrial cancer recent hospitalization with discharge home yesterday for management of an AKI who presents from home by daughter with concerns for worsening altered mental status and aggressiveness and confusion. ?  ?OT comments ? Upon entering the room, pt seated in recliner chair. Pt is agreeable to OT intervention but with encouragement. Pt also writes down time, therapist's name, and why I have said I am present in room to work with her. Pt reports, "don't touch me" and does not want assist or cuing from therapist during session. Pt demonstrates ability to stand and ambulate with RW 10' to [facility] for toileting needs with supervision. Pt performs clothing management and hygiene without physical assistance and returns to recliner chair. Pt's HR increases to 120's with activity.   ? ?Recommendations for follow up therapy are one component of a multi-disciplinary discharge planning process, led by the attending physician.  Recommendations may be updated based on patient status, additional functional criteria and insurance authorization. ?   ?Follow Up Recommendations ? Skilled nursing-short term rehab (<3 hours/day)  ?  ?Assistance Recommended at Discharge Frequent or constant Supervision/Assistance  ?Patient can return home with the following ? Assistance with cooking/housework;Assist for transportation;Help with stairs or ramp for entrance;Direct supervision/assist for medications management;A little help with walking and/or transfers;A little help with bathing/dressing/bathroom ?  ?Equipment Recommendations ?  Other (comment) (defer to next venue of care)  ?  ?   ?Precautions / Restrictions Precautions ?Precautions: Fall ?Precaution Comments: watch HR ?Restrictions ?Weight Bearing Restrictions: No  ? ? ?  ? ?Mobility Bed Mobility ?  ?  ?  ?  ?  ?  ?  ?General bed mobility comments: pt up in chair at start and end of session ?  ? ?Transfers ?Overall transfer level: Needs assistance ?Equipment used: Rolling walker (2 wheels) ?Transfers: Sit to/from Stand ?Sit to Stand: Supervision ?  ?  ?  ?  ?  ?  ?  ?  ?Balance Overall balance assessment: Needs assistance ?Sitting-balance support: Feet supported, No upper extremity supported ?Sitting balance-Leahy Scale: Good ?  ?  ?Standing balance support: Bilateral upper extremity supported, Reliant on assistive device for balance, During functional activity ?Standing balance-Leahy Scale: Fair ?  ?  ?  ?  ?  ?  ?  ?  ?  ?  ?  ?  ?   ? ?ADL either performed or assessed with clinical judgement  ? ?ADL Overall ADL's : Needs assistance/impaired ?  ?  ?  ?  ?  ?  ?  ?  ?  ?  ?  ?  ?Toilet Transfer: BSC/3in1;Ambulation;Supervision/safety ?  ?Toileting- Clothing Manipulation and Hygiene: Supervision/safety;Sit to/from stand ?  ?  ?  ?  ?General ADL Comments: Pt refuses to allow therapist to assist/touch her ?  ? ?Extremity/Trunk Assessment Upper Extremity Assessment ?Upper Extremity Assessment: Generalized weakness ?  ?Lower Extremity Assessment ?Lower Extremity Assessment: Generalized weakness ?  ?  ?  ? ?Vision Patient Visual Report: No change from baseline ?  ?  ?   ?   ? ?Cognition Arousal/Alertness: Awake/alert ?Behavior During Therapy: [facility] for tasks assessed/performed ?Overall Cognitive Status: No family/caregiver present  to determine baseline cognitive functioning ?  ?  ?  ?  ?  ?  ?  ?  ?  ?  ?  ?  ?  ?  ?  ?  ?General Comments: pt oriented to self and place situation ?  ?  ?   ?   ?   ?   ? ? ?Pertinent Vitals/ Pain       Pain Assessment ?Pain Assessment: No/denies pain ? ?   ?    ? ?Frequency ? Min 2X/week  ? ? ? ? ?  ?Progress Toward Goals ? ?OT Goals(current goals can now be found in the care plan section) ? Progress towards OT goals: Progressing toward goals ? ?Acute Rehab OT Goals ?Patient Stated Goal: to go to rehab ?OT Goal Formulation: With patient ?Time For Goal Achievement: 02/20/22 ?Potential to Achieve Goals: Good  ?Plan Frequency remains appropriate;Discharge plan remains appropriate   ? ?   ?AM-PAC OT "6 Clicks" Daily Activity     ?Outcome Measure ? ? Help from another person eating meals?: None ?Help from another person taking care of personal grooming?: None ?Help from another person toileting, which includes using toliet, bedpan, or urinal?: A Little ?Help from another person bathing (including washing, rinsing, drying)?: A Little ?Help from another person to put on and taking off regular upper body clothing?: None ?Help from another person to put on and taking off regular lower body clothing?: A Little ?6 Click Score: 21 ? ?  ?End of Session Equipment Utilized During Treatment: Rolling walker (2 wheels) ? ?OT Visit Diagnosis: Other abnormalities of gait and mobility (R26.89);Muscle weakness (generalized) (M62.81);History of falling (Z91.81) ?  ?Activity Tolerance Patient tolerated treatment well ?  ?Patient Left with call bell/phone within reach;in chair ?  ?Nurse Communication Mobility status ?  ? ?   ? ?Time: 5170-0174 ?OT Time Calculation (min): 18 min ? ?Charges: OT General Charges ?$OT Visit: 1 Visit ?OT Treatments ?$Self Care/Home Management : 8-22 mins ? ?Darleen Crocker, [place], OTR/L , [place] ?ascom [phone]  ?02/10/22, 1:18 PM  ?

## 2022-02-10 NOTE — Progress Notes (Signed)
Physical Therapy Treatment ?Patient Details ?Name: Sabrina [name] ?MRN: [id number] ?DOB: 1946-09-11 ?Today's Date: 02/10/2022 ? ? ?History of Present Illness Patient is a 76 y.o. female  female with medical history significant for type 2 diabetes, hyperlipidemia, hypertension, recently diagnosed saddle PE status post thrombectomy 3 weeks ago, now on Eliquis, CVA, postmenopausal bleeding with concern for endometrial cancer recent hospitalization with discharge home yesterday for management of an AKI who presents from home by daughter with concerns for worsening altered mental status and aggressiveness and confusion. ? ?  ?PT Comments  ? ? Patient alert, up in recliner at start of session. Agreeable to PT with education and information, but did remain frustrated with PT throughout. She was able to perform sit <> Stand with BUE support, supervision. Fair initial standing balance, and after a standing rest break ambulated ~43f with RW and CGA. Pt reported 8/10 bilateral leg pain that is constantly present. She returned to sitting and was able to demonstrate seated marching, LAQ, and heel/toe raises. The patient would benefit from further skilled PT intervention to continue to progress towards goals. Recommendation remains appropriate.  ?   ?Recommendations for follow up therapy are one component of a multi-disciplinary discharge planning process, led by the attending physician.  Recommendations may be updated based on patient status, additional functional criteria and insurance authorization. ? ?Follow Up Recommendations ? Skilled nursing-short term rehab (<3 hours/day) ?  ?  ?Assistance Recommended at Discharge Frequent or constant Supervision/Assistance  ?Patient can return home with the following A little help with walking and/or transfers;A little help with bathing/dressing/bathroom;Assistance with cooking/housework;Help with stairs or ramp for entrance ?  ?Equipment Recommendations ? None recommended by PT  ?   ?Recommendations for Other Services   ? ? ?  ?Precautions / Restrictions Precautions ?Precautions: Fall ?Restrictions ?Weight Bearing Restrictions: No  ?  ? ?Mobility ? Bed Mobility ?  ?  ?  ?  ?  ?  ?  ?General bed mobility comments: pt up in chair at start of session ?  ? ?Transfers ?Overall transfer level: Needs assistance ?Equipment used: Rolling walker (2 wheels) ?Transfers: Sit to/from Stand ?Sit to Stand: Supervision ?  ?  ?  ?  ?  ?General transfer comment: pt with good hand placement, initial standing balance fair, improvement with UE support ?  ? ?Ambulation/Gait ?Ambulation/Gait assistance: Min guard ?Gait Distance (Feet): 15 Feet ?Assistive device: Rolling walker (2 wheels) ?Gait Pattern/deviations: Step-through pattern ?  ?  ?  ?  ? ? ?Stairs ?  ?  ?  ?  ?  ? ? ?Wheelchair Mobility ?  ? ?Modified Rankin (Stroke Patients Only) ?  ? ? ?  ?Balance Overall balance assessment: Needs assistance ?Sitting-balance support: Feet supported, No upper extremity supported ?Sitting balance-Leahy Scale: Good ?  ?  ?Standing balance support: Bilateral upper extremity supported ?Standing balance-Leahy Scale: Fair ?  ?  ?  ?  ?  ?  ?  ?  ?  ?  ?  ?  ?  ? ?  ?Cognition Arousal/Alertness: Awake/alert ?Behavior During Therapy: [facility] tasks assessed/performed ?Overall Cognitive Status: No family/caregiver present to determine baseline cognitive functioning ?  ?  ?  ?  ?  ?  ?  ?  ?  ?  ?  ?  ?  ?  ?  ?  ?General Comments: pt oriented to self and place situation ?  ?  ? ?  ?Exercises   ? ?  ?General Comments   ?  ?  ? ?  Pertinent Vitals/Pain Pain Assessment ?Pain Assessment: 0-10 ?Pain Score: 8  ?Pain Location: bilateral legs ?Pain Descriptors / Indicators: Aching, Sore ?Pain Intervention(s): Limited activity within patient's tolerance, Monitored during session, Repositioned  ? ? ?Home Living   ?  ?  ?  ?  ?  ?  ?  ?  ?  ?   ?  ?Prior Function    ?  ?  ?   ? ?PT Goals (current goals can now be found in the care plan section)    ? ?  ?Frequency ? ? ? Min 2X/week ? ? ? ?  ?PT Plan Current plan remains appropriate  ? ? ?Co-evaluation   ?  ?  ?  ?  ? ?  ?AM-PAC PT "6 Clicks" Mobility   ?Outcome Measure ? Help needed turning from your back to your side while in a flat bed without using bedrails?: A Little ?Help needed moving from lying on your back to sitting on the side of a flat bed without using bedrails?: A Little ?Help needed moving to and from a bed to a chair (including a wheelchair)?: A Little ?Help needed standing up from a chair using your arms (e.g., wheelchair or bedside chair)?: A Little ?Help needed to walk in hospital room?: A Lot ?Help needed climbing 3-5 steps with a railing? : A Lot ?6 Click Score: 16 ? ?  ?End of Session   ?Activity Tolerance: Patient tolerated treatment well ?Patient left: in chair;with call bell/phone within reach ?Nurse Communication: Mobility status ?PT Visit Diagnosis: Unsteadiness on feet (R26.81);Muscle weakness (generalized) (M62.81) ?  ? ? ?Time: 4163-8453 ?PT Time Calculation (min) (ACUTE ONLY): 11 min ? ?Charges:  $Therapeutic Activity: 8-22 mins          ?          ? ?Lieutenant Diego PT, DPT ?9:24 AM,02/10/22 ? ? ?

## 2022-02-11 ENCOUNTER — Inpatient Hospital Stay (HOSPITAL_BASED_OUTPATIENT_CLINIC_OR_DEPARTMENT_OTHER): Payer: Medicare HMO | Admitting: Oncology

## 2022-02-11 ENCOUNTER — Inpatient Hospital Stay (HOSPITAL_BASED_OUTPATIENT_CLINIC_OR_DEPARTMENT_OTHER): Payer: Medicare HMO | Admitting: Hospice and Palliative Medicine

## 2022-02-11 ENCOUNTER — Encounter: Payer: Self-pay | Admitting: Oncology

## 2022-02-11 ENCOUNTER — Other Ambulatory Visit: Payer: Self-pay

## 2022-02-11 ENCOUNTER — Inpatient Hospital Stay (HOSPITAL_BASED_OUTPATIENT_CLINIC_OR_DEPARTMENT_OTHER): Payer: Medicare HMO | Admitting: Obstetrics and Gynecology

## 2022-02-11 VITALS — BP 102/55 | HR 77 | Temp 98.7°F | Resp 19 | Wt 126.0 lb

## 2022-02-11 VITALS — BP 102/55 | HR 77 | Temp 98.7°F | Resp 19 | Wt 126.1 lb

## 2022-02-11 DIAGNOSIS — Z7189 Other specified counseling: Secondary | ICD-10-CM | POA: Diagnosis not present

## 2022-02-11 DIAGNOSIS — Z86711 Personal history of pulmonary embolism: Secondary | ICD-10-CM | POA: Diagnosis not present

## 2022-02-11 DIAGNOSIS — Z79899 Other long term (current) drug therapy: Secondary | ICD-10-CM | POA: Diagnosis not present

## 2022-02-11 DIAGNOSIS — I10 Essential (primary) hypertension: Secondary | ICD-10-CM | POA: Diagnosis not present

## 2022-02-11 DIAGNOSIS — F312 Bipolar disorder, current episode manic severe with psychotic features: Secondary | ICD-10-CM | POA: Diagnosis not present

## 2022-02-11 DIAGNOSIS — C541 Malignant neoplasm of endometrium: Secondary | ICD-10-CM | POA: Diagnosis not present

## 2022-02-11 DIAGNOSIS — R180 Malignant ascites: Secondary | ICD-10-CM | POA: Diagnosis not present

## 2022-02-11 DIAGNOSIS — C549 Malignant neoplasm of corpus uteri, unspecified: Secondary | ICD-10-CM

## 2022-02-11 DIAGNOSIS — R5381 Other malaise: Secondary | ICD-10-CM | POA: Diagnosis not present

## 2022-02-11 DIAGNOSIS — E119 Type 2 diabetes mellitus without complications: Secondary | ICD-10-CM | POA: Diagnosis not present

## 2022-02-11 DIAGNOSIS — I2692 Saddle embolus of pulmonary artery without acute cor pulmonale: Secondary | ICD-10-CM | POA: Diagnosis not present

## 2022-02-11 DIAGNOSIS — K219 Gastro-esophageal reflux disease without esophagitis: Secondary | ICD-10-CM | POA: Diagnosis not present

## 2022-02-11 DIAGNOSIS — N1832 Chronic kidney disease, stage 3b: Secondary | ICD-10-CM | POA: Diagnosis not present

## 2022-02-11 DIAGNOSIS — N179 Acute kidney failure, unspecified: Secondary | ICD-10-CM | POA: Diagnosis not present

## 2022-02-11 DIAGNOSIS — F319 Bipolar disorder, unspecified: Secondary | ICD-10-CM | POA: Diagnosis not present

## 2022-02-11 NOTE — Progress Notes (Signed)
? ? ?Hematology/Oncology Progress note ?Telephone:(336) [phone] [phone] ?  ? ? ?Patient Care Team: ?[name], MD as PCP - General (Internal Medicine)  ? ?Name of the patient: Sabrina [name]  ?[id number]  ?1946/03/25  ? ?REASON FOR VISIT ?Endometrial cancer ? ?INTERVAL HISTORY ?76 y.o. female presents for evaluation of endometrial cancer. ? ?#Hospitalization from 04/16/2021 - 04/19/2021 due to UTI, weakness. CT done on 04/16/2021, she had an abnormal appearance of the uterus with prominent fluid distention of the endometrial cavity, areas of surface nodularity.  Concerning for endometrial malignancy.  Pelvic floor laxity with mild cystocele ?Transvaginal ultrasound showed Dilated and thickened endometrial canal with fluid and hematocrit  ?level consistent with hemorrhage. Multiple areas of nodularity are noted. These are highly suspicious for endometrial carcinoma. Gynecologic referral is recommended for tissue sampling and diagnosis.  ?Patient was evaluated by gynecology Dr. Leafy Ro and an endometrial biopsy was recommended.  Patient declined and a biopsy was not done during the hospitalization. ?Apparently patient lost follow-up. ? ?#hospitalization 01/05/2022 - 01/11/2022, hospitalization due to acute saddle pulmonary embolism.  She was also noted to have small bilateral pleural effusion large amount of ascites in the upper abdomen.  Patient was anticoagulated.  Patient is status post thrombolysis and mechanical thrombectomy.  She was discharged on Eliquis.  Lower extremity ultrasound showed negative for DVT. ?  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 ?01/29/2022, status post ultrasound-guided paracentesis.  Peritoneal fluid was positive for malignancy. ?Oncology was consulted during her admission.  Patient was not interested in chemotherapy that causes hair loss. ?Patient has bipolar disorder with psychotic  behavior.  patient was evaluated by psychiatrist during this admission and was deemed competent for decision capacity. ?Patient was discharged and readmitted due to psychosis.  Patient does not meet inpatient criteria for admission.  Patient received Haldol and was started on Zyprexa 2.5 mg at bedtime. ? ?Today patient presents for post hospitalization follow-up.  Patient was seen by Dr. Fransisca Connors today.  Not surgical candidate.   ?Patient lives with her daughter.  She was accompanied her daughter today. ? ? ? ?Review of Systems  ?Unable to perform ROS: Psychiatric disorder  ?Constitutional:  Positive for fatigue.  ?Respiratory:  Negative for cough.   ?Gastrointestinal:  Positive for abdominal distention. Negative for abdominal pain.  ?Skin:  Negative for rash.  ?Hematological:  Does not bruise/bleed easily.   ? ? ?Allergies  ?Allergen Reactions  ? Glimepiride Diarrhea and Other (See Comments)  ? Glipizide Other (See Comments)  ? Lisinopril Other (See Comments)  ?  headache  ? Losartan Other (See Comments)  ?  Headache  ? Norco [Hydrocodone-Acetaminophen] Other (See Comments)  ?  Mood changes  ? Pioglitazone Swelling  ?  On legs  ? Penicillins Diarrhea and Rash  ? ? ? ?Past Medical History:  ?Diagnosis Date  ? Diabetes mellitus without complication ([place])   ? Endometrial cancer ([place]) 01/29/2022  ? High cholesterol   ? Hypertension   ? ? ? ?Past Surgical History:  ?Procedure Laterality Date  ? DILATION AND CURETTAGE OF UTERUS    ? POLYPECTOMY    ? PULMONARY THROMBECTOMY Bilateral 01/08/2022  ? Procedure: PULMONARY THROMBECTOMY;  Surgeon: Algernon Huxley, MD;  Location: [facility];  Service: Cardiovascular;  Laterality: Bilateral;  ? ? ?Social History  ? ?Socioeconomic History  ? Marital status: Divorced  ?  Spouse name: Not on file  ? Number of children: Not  on file  ? Years of education: Not on file  ? Highest education level: Not on file  ?Occupational History  ? Occupation: retired  ?Tobacco Use  ? Smoking  status: Never  ? Smokeless tobacco: Never  ?Vaping Use  ? Vaping Use: Never used  ?Substance and Sexual Activity  ? Alcohol use: No  ? Drug use: No  ? Sexual activity: Not Currently  ?  Birth control/protection: Post-menopausal  ?Other Topics Concern  ? Not on file  ?Social History Narrative  ? Not on file  ? ?Social Determinants of Health  ? ?Financial Resource Strain: Not on file  ?Food Insecurity: Not on file  ?Transportation Needs: Not on file  ?Physical Activity: Not on file  ?Stress: Not on file  ?Social Connections: Not on file  ?Intimate Partner Violence: Not on file  ? ? ?Family History  ?Problem Relation Age of Onset  ? Breast cancer Maternal Aunt   ?     2 aunts in their 44's  ? Breast cancer Maternal Aunt 1  ? Ovarian cancer Neg Hx   ? Colon cancer Neg Hx   ? ? ? ?Current Outpatient Medications:  ?  Accu-Chek Softclix Lancets lancets, , Disp: , Rfl:  ?  Accu-Chek Softclix Lancets lancets, Use to check blood sugar twice a day as directed., Disp: , Rfl:  ?  apixaban (ELIQUIS) 5 MG TABS tablet, Take one tablet (5 mg) by mouth twice daily, Disp: 72 tablet, Rfl: 0 ?  metFORMIN (GLUCOPHAGE) 1000 MG tablet, Take 1,000 mg by mouth daily., Disp: , Rfl:  ?  metFORMIN (GLUCOPHAGE) 500 MG tablet, Take 2 tablets (1,000 mg total) by mouth daily., Disp: 30 tablet, Rfl: 1 ?  OLANZapine zydis (ZYPREXA) 5 MG disintegrating tablet, Take 0.5 tablets (2.5 mg total) by mouth 2 (two) times daily., Disp: 60 tablet, Rfl: 1 ?  ondansetron (ZOFRAN-ODT) 4 MG disintegrating tablet, Take by mouth., Disp: , Rfl:  ?  pantoprazole (PROTONIX) 40 MG tablet, Take 1 tablet (40 mg total) by mouth daily., Disp: 30 tablet, Rfl: 0 ?  polyethylene glycol (MIRALAX / GLYCOLAX) 17 g packet, Take 17 g by mouth daily as needed for moderate constipation., Disp: 30 each, Rfl: 0 ?  propranolol ER (INDERAL LA) 60 MG 24 hr capsule, Take 60 mg by mouth daily., Disp: , Rfl:  ? ?Physical exam:  ?Vitals:  ? 02/11/22 1032  ?BP: (!) 102/55  ?Pulse: 77  ?Resp:  19  ?Temp: 98.7 ?F (37.1 ?C)  ?Weight: 126 lb 1.7 oz (57.2 kg)  ? ?Physical Exam ?HENT:  ?   Head: Normocephalic.  ?Cardiovascular:  ?   Rate and Rhythm: Normal rate.  ?Pulmonary:  ?   Effort: Pulmonary effort is normal. No respiratory distress.  ?Abdominal:  ?   General: There is distension.  ?Musculoskeletal:     ?   General: Normal range of motion.  ?   Cervical back: Normal range of motion.  ?Skin: ?   General: Skin is warm.  ?Neurological:  ?   Mental Status: She is alert and oriented to person, place, and time. Mental status is at baseline.  ? ? ? ?  Latest Ref Rng & Units 02/09/2022  ?  4:10 AM  ?CMP  ?Creatinine 0.44 - 1.00 mg/dL 1.60    ? ? ?  Latest Ref Rng & Units 02/09/2022  ?  4:10 AM  ?CBC  ?Hemoglobin 12.0 - 15.0 g/dL 9.8    ?Hematocrit 36.0 - 46.0 % 31.6    ? ? ?  RADIOGRAPHIC STUDIES: ?I have personally reviewed the radiological images as listed and agreed with the findings in the report. ?CT ABDOMEN PELVIS WO CONTRAST ? ?Result Date: 01/29/2022 ?CLINICAL DATA:  Weakness and abdominal swelling. Increasing constipation. Last CT was abnormal concerning for uterine neoplasm, unknown what follow-up may have been done following the CT reading. There is note made of a dilatation and curettage of the uterus in the patient's surgical history but the date is unknown. Most recently the patient underwent mechanical pulmonary arterial thrombectomy on 01/08/2022. EXAM: CT ABDOMEN AND PELVIS WITHOUT CONTRAST TECHNIQUE: Multidetector CT imaging of the abdomen and pelvis was performed following the standard protocol without IV contrast. RADIATION DOSE REDUCTION: This exam was performed according to the departmental dose-optimization program which includes automated exposure control, adjustment of the mA and/or kV according to patient size and/or use of iterative reconstruction technique. COMPARISON:  CT without contrast 04/16/2021, CT with IV and oral contrast 06/16/2019 FINDINGS: Lower chest: There is a small to  moderate-sized left and small size layering right pleural effusions with adjacent consolidation or atelectasis in the left-greater-than-right lower lobes. The cardiac size is normal. Coronary arteries are heavily calc

## 2022-02-11 NOTE — Progress Notes (Signed)
? ?  ?Palliative Medicine ?[place] [facility] ?Telephone:(336) [phone] Fax:(336) [phone] ? ? ?Name: Sabrina [name] ?Date: 02/11/2022 ?MRN: [id number]  ?DOB: May 25, 1946 ? ?Patient Care Team: ?[name], MD as PCP - General (Internal Medicine)  ? ? ?REASON FOR CONSULTATION: ?Sabrina [name] is a 76 y.o. female with multiple medical problems including diabetes, history of acute PE status post thrombectomy, dementia, bipolar disorder, and stage IV adenocarcinoma of the endometrium.  Patient was hospitalized 01/05/2022 to 01/12/2019 with an acute saddle PE requiring thrombectomy.  She was hospitalized again 01/28/2022 to 02/03/2022 with AKI.  Peritoneal fluid cytology was positive for high-grade serous carcinoma.  In conversations with oncology, patient declined chemotherapy.  Palliative care was consulted to help address goals the patient was reluctant to speak with them.  Her hospitalization was complicated by manic and possible psychotic behavior.  Psychiatry was consulted and ultimately determined the patient had capacity for decision-making.  Patient was readmitted on 02/05/2022 as an IVC due to aggressive behaviors.  Patient was ultimately discharged on 02/10/2022 to rehab. ? ?SOCIAL HISTORY:    ? reports that she has never smoked. She has never used smokeless tobacco. She reports that she does not drink alcohol and does not use drugs. ? ?Patient is currently in rehab.  She has a daughter who is involved ? ?ADVANCE DIRECTIVES:  ?None on file ? ?CODE STATUS:  ? ?PAST MEDICAL HISTORY: ?Past Medical History:  ?Diagnosis Date  ? Diabetes mellitus without complication ([place])   ? Endometrial cancer ([place]) 01/29/2022  ? High cholesterol   ? Hypertension   ? ? ?PAST SURGICAL HISTORY:  ?Past Surgical History:  ?Procedure Laterality Date  ? DILATION AND CURETTAGE OF UTERUS    ? POLYPECTOMY    ? PULMONARY THROMBECTOMY Bilateral 01/08/2022  ? Procedure: PULMONARY THROMBECTOMY;  Surgeon: Algernon Huxley, MD;   Location: [facility];  Service: Cardiovascular;  Laterality: Bilateral;  ? ? ?HEMATOLOGY/ONCOLOGY HISTORY:  ?Oncology History  ?Endometrial cancer ([place])  ?01/29/2022 Initial Diagnosis  ? Endometrial cancer ([place]) ? ?  ?02/11/2022 Cancer Staging  ? Staging form: Corpus Uteri - Carcinoma and Carcinosarcoma, AJCC 8th Edition ?- Clinical stage from 02/11/2022: Stage Unknown Sabrina [name]) - Signed by Earlie Server, MD on 02/11/2022 ?Stage prefix: Initial diagnosis ? ?  ? ? ?ALLERGIES:  is allergic to glimepiride, glipizide, lisinopril, losartan, norco [hydrocodone-acetaminophen], pioglitazone, and penicillins. ? ?MEDICATIONS:  ?Current Outpatient Medications  ?Medication Sig Dispense Refill  ? Accu-Chek Softclix Lancets lancets     ? Accu-Chek Softclix Lancets lancets Use to check blood sugar twice a day as directed.    ? apixaban (ELIQUIS) 5 MG TABS tablet Take one tablet (5 mg) by mouth twice daily 72 tablet 0  ? metFORMIN (GLUCOPHAGE) 1000 MG tablet Take 1,000 mg by mouth daily.    ? metFORMIN (GLUCOPHAGE) 500 MG tablet Take 2 tablets (1,000 mg total) by mouth daily. 30 tablet 1  ? OLANZapine zydis (ZYPREXA) 5 MG disintegrating tablet Take 0.5 tablets (2.5 mg total) by mouth 2 (two) times daily. 60 tablet 1  ? ondansetron (ZOFRAN-ODT) 4 MG disintegrating tablet Take by mouth.    ? pantoprazole (PROTONIX) 40 MG tablet Take 1 tablet (40 mg total) by mouth daily. 30 tablet 0  ? polyethylene glycol (MIRALAX / GLYCOLAX) 17 g packet Take 17 g by mouth daily as needed for moderate constipation. 30 each 0  ? propranolol ER (INDERAL LA) 60 MG 24 hr capsule Take 60 mg by mouth  daily.    ? ?No current facility-administered medications for this visit.  ? ? ?VITAL SIGNS: ?There were no vitals taken for this visit. ?There were no vitals filed for this visit.  ?Estimated body mass index is 23.05 kg/m? as calculated from the following: ?  Height as of 02/05/22: '5\' 2"'$  (1.575 m). ?  Weight as of an earlier encounter on 02/11/22: 126 lb (57.2  kg). ? ?LABS: ?CBC: ?   ?Component Value Date/Time  ? WBC 6.0 02/08/2022 0531  ? HGB 9.8 (L) 02/09/2022 0410  ? HCT 31.6 (L) 02/09/2022 0410  ? PLT 461 (H) 02/08/2022 0531  ? MCV 87.6 02/08/2022 0531  ? NEUTROABS 3.7 02/04/2022 1427  ? LYMPHSABS 0.9 02/04/2022 1427  ? MONOABS 0.4 02/04/2022 1427  ? EOSABS 0.0 02/04/2022 1427  ? BASOSABS 0.0 02/04/2022 1427  ? ?Comprehensive Metabolic Panel: ?   ?Component Value Date/Time  ? NA 137 02/08/2022 0531  ? K 4.0 02/08/2022 0531  ? CL 104 02/08/2022 0531  ? CO2 24 02/08/2022 0531  ? BUN 25 (H) 02/08/2022 0531  ? CREATININE 1.60 (H) 02/09/2022 0410  ? GLUCOSE 106 (H) 02/08/2022 0531  ? CALCIUM 9.0 02/08/2022 0531  ? CALCIUM 9.3 02/01/2022 0442  ? AST 28 02/04/2022 1427  ? ALT 14 02/04/2022 1427  ? ALKPHOS 55 02/04/2022 1427  ? BILITOT 1.0 02/04/2022 1427  ? PROT 6.7 02/04/2022 1427  ? ALBUMIN 3.0 (L) 02/04/2022 1427  ? ? ?RADIOGRAPHIC STUDIES: ?CT ABDOMEN PELVIS WO CONTRAST ? ?Result Date: 01/29/2022 ?CLINICAL DATA:  Weakness and abdominal swelling. Increasing constipation. Last CT was abnormal concerning for uterine neoplasm, unknown what follow-up may have been done following the CT reading. There is note made of a dilatation and curettage of the uterus in the patient's surgical history but the date is unknown. Most recently the patient underwent mechanical pulmonary arterial thrombectomy on 01/08/2022. EXAM: CT ABDOMEN AND PELVIS WITHOUT CONTRAST TECHNIQUE: Multidetector CT imaging of the abdomen and pelvis was performed following the standard protocol without IV contrast. RADIATION DOSE REDUCTION: This exam was performed according to the departmental dose-optimization program which includes automated exposure control, adjustment of the mA and/or kV according to patient size and/or use of iterative reconstruction technique. COMPARISON:  CT without contrast 04/16/2021, CT with IV and oral contrast 06/16/2019 FINDINGS: Lower chest: There is a small to moderate-sized left and  small size layering right pleural effusions with adjacent consolidation or atelectasis in the left-greater-than-right lower lobes. The cardiac size is normal. Coronary arteries are heavily calcified with calcifications in the aortic valve plane. Hepatobiliary: 14 cm in length liver with normal noncontrast attenuation. The gallbladder and bile ducts are unremarkable. Some images suggest slight capsular nodularity anteriorly in the left lobe lateral segment which may be seen with early cirrhosis. Pancreas: No focal abnormality or ductal dilatation. Spleen: No focal abnormality or splenomegaly. Adrenals/Urinary Tract: There is no adrenal mass or focal renal cortical abnormality visible without contrast. There is no evidence of urinary stones or obstruction. There is no bladder thickening. Stomach/Bowel: The bowel is displaced inward by large-volume abdominal and pelvic free ascites. The appendix is normal in caliber and no small bowel obstruction is seen. Stomach mostly surrounded by ascites with the small-bowel variably obscured by ascites insinuating into the mesenteric folds. The wall of the large intestine is not focally remarkable. Scattered sigmoid diverticulosis. Vascular/Lymphatic: The aorta and iliac arteries are heavily calcified. There is no AAA. There is ill-defined heterogeneous masslike abnormality within the upper left pelvic component  of the fluid difficult to measure but could measure as much as 5.5 x 3.7 by 7.5 cm on 2:59 and 5:51. There is no further masslike abnormality in the fluid visible without contrast. Reproductive: The uterus is in-situ and no longer demonstrates fluid distention in the cavity. The ovaries are not identified. They're probably within the fluid. Again noted is a 2.7 cm pedunculated fibroid of the posterior uterine fundus. Other: There is a large volume of abdominal and pelvic free ascites ranging in density from 12 up to 24 Hounsfield units. There is nodular thickening  extending across the omentum in the upper abdomen, highly worrisome for omental metastases and not seen previously. Possibility of malignant ascites is favored. 2.6 cm chronic calcified mass in the mesentery in

## 2022-02-11 NOTE — Progress Notes (Signed)
Gynecologic Oncology Consult Visit  ? ?Referring Provider: Dr Leafy Ro ? ?Chief Concern: Endometrial Cancer ?Subjective:  ?Sabrina [name] is a 76 y.o. female who is seen in consultation from [facility] for postmenopausal bleeding and CT scan 6/22 concerning for endometrial cancer. ? ?Gynecologic Oncology History:  ?Developed PMB on anticoagulation while in hospital and saw Dr Leafy Ro.  Was admitted at that time June 2022 for weakness and sepsis.  As part of the work-up a CT scan was performed which showed findings concerning for uterine cancer.  Ultrasound was performed which showed dilated and thickened endometrial canal.  Findings highly suspicious for endometrial carcinoma.  Outpatient work-up including biopsy was recommended.  Patient declined additional work-up at that time. ? ?She presented to ER in March 2023 with syncope. Was found to have acute saddle pulmonary embolism with heart strain.  Underwent thrombolysis and mechanical thrombectomy.  Converted to Eliquis.  No evidence of DVT.  While on Eliquis, she developed postmenopausal bleeding during hospitalization. ?  ?She was seen by Dr. Fransisca Connors in April 2023.  Endometrial biopsy was performed.  CT scan to assess for metastatic disease was recommended.  During that visit, labs were performed which showed worsening kidney function, leg swelling, swelling of her abdomen.  She was referred to the ER and admitted on 01/28/2022. CT abdomen pelvis was performed which showed new large volume abdominal and pelvic ascites with nodular thickening across the upper abdomen suspicious for omental metastases, possible malignant ascites, likely peritoneal metastases, possible early cirrhosis but no obvious liver lesions, bilateral pleural effusion with adjacent consolidation or atelectasis in the lower lobes. ? ?endometrial biopsy  ?- POSITIVE FOR MALIGNANCY.  ?- POORLY DIFFERENTIATED MALIGNANT NEOPLASM, FAVOR HIGH GRADE SEROUS  ?CARCINOMA.  ? ?Comment:  Immunohistochemical  studies were performed. Tumor cells show patchy marking for Pax-8, with strong and diffuse staining for p16. p53 also shows strong and diffuse staining, indicative of a mutated or overexpressed staining pattern. Progesterone receptor only shows rare dim positive cells. These findings support the above diagnosis.  ? ?Paracentesis 01/29/22  ?Malignant cells consistent with above endometrial biopsy/high grade serous carcinoma.  ? ? ? ?Patient Active Problem List  ? Diagnosis Date Noted  ? Goals of care, counseling/discussion 02/11/2022  ? Acute kidney injury ([place]) 02/07/2022  ? Physical deconditioning 02/05/2022  ? AKI (acute kidney injury) ([place]) 01/30/2022  ? Metabolic acidosis, increased anion gap 01/29/2022  ? Ascites 01/29/2022  ? Serous carcinoma of body of uterus ([place]) 01/29/2022  ? Microcytic anemia 01/28/2022  ? Postmenopausal bleeding 01/27/2022  ? Hyponatremia 01/10/2022  ? GERD (gastroesophageal reflux disease) 01/08/2022  ? Elevated troponin 01/05/2022  ? Diabetes mellitus without complication ([place]) 29/47/6546  ? Acute saddle pulmonary embolism ([place]) 01/05/2022  ? Abnormal finding present on diagnostic imaging of uterus   ? Generalized weakness   ? History of stroke 03/02/2020  ? Leg weakness, bilateral 03/02/2020  ? Slurred speech 03/02/2020  ? Transient confusion 03/02/2020  ? Acute CVA (cerebrovascular accident) ([place]) 01/31/2020  ? Hyperglycemia due to type 2 diabetes mellitus ([place]) 01/30/2020  ? Hepatic steatosis 06/27/2019  ? History of behavioral and mental health problems 06/27/2019  ? Abnormal finding on CT scan 06/27/2019  ? Abdominal bloating 05/16/2019  ? Epigastric pain 05/16/2019  ? Personal history of malignant carcinoid tumor of rectum 05/16/2019  ? Mild nonproliferative diabetic retinopathy of left eye with macular edema associated with type 2 diabetes mellitus ([place]) 05/15/2019  ? Incomplete uterine prolapse 12/08/2017  ? Cystocele, midline 12/08/2017  ? LVH (  left ventricular hypertrophy) due  to hypertensive disease, without heart failure 06/01/2016  ? Bipolar affective disorder, manic, severe, with psychotic behavior ([place])   ? Type 2 diabetes mellitus with hyperlipidemia ([place]) 04/01/2016  ? Hyperlipidemia 04/01/2016  ? Noncompliance 03/28/2016  ? B12 deficiency 12/18/2014  ? ? ? ? ? ? ?Problem List: ?Patient Active Problem List  ? Diagnosis Date Noted  ? Acute kidney injury ([place]) 02/07/2022  ? Physical deconditioning 02/05/2022  ? AKI (acute kidney injury) ([place]) 01/30/2022  ? Metabolic acidosis, increased anion gap 01/29/2022  ? Ascites 01/29/2022  ? Stage IV adenocarcinoma of endometrium ([place]) 01/29/2022  ? Endometrial cancer ([place]) 01/29/2022  ? Microcytic anemia 01/28/2022  ? Postmenopausal bleeding 01/27/2022  ? Hyponatremia 01/10/2022  ? GERD (gastroesophageal reflux disease) 01/08/2022  ? Elevated troponin 01/05/2022  ? Diabetes mellitus without complication ([place]) 62/56/3893  ? Acute saddle pulmonary embolism ([place]) 01/05/2022  ? Abnormal finding present on diagnostic imaging of uterus   ? Generalized weakness   ? History of stroke 03/02/2020  ? Leg weakness, bilateral 03/02/2020  ? Slurred speech 03/02/2020  ? Transient confusion 03/02/2020  ? Acute CVA (cerebrovascular accident) ([place]) 01/31/2020  ? Hyperglycemia due to type 2 diabetes mellitus ([place]) 01/30/2020  ? Hepatic steatosis 06/27/2019  ? History of behavioral and mental health problems 06/27/2019  ? Abnormal finding on CT scan 06/27/2019  ? Abdominal bloating 05/16/2019  ? Epigastric pain 05/16/2019  ? Personal history of malignant carcinoid tumor of rectum 05/16/2019  ? Mild nonproliferative diabetic retinopathy of left eye with macular edema associated with type 2 diabetes mellitus ([place]) 05/15/2019  ? Incomplete uterine prolapse 12/08/2017  ? Cystocele, midline 12/08/2017  ? LVH (left ventricular hypertrophy) due to hypertensive disease, without heart failure 06/01/2016  ? Bipolar affective disorder, manic, severe, with psychotic behavior  ([place])   ? Type 2 diabetes mellitus with hyperlipidemia ([place]) 04/01/2016  ? Hyperlipidemia 04/01/2016  ? Noncompliance 03/28/2016  ? B12 deficiency 12/18/2014  ? ? ?Past Medical History: ?Past Medical History:  ?Diagnosis Date  ? Diabetes mellitus without complication ([place])   ? Endometrial cancer ([place]) 01/29/2022  ? High cholesterol   ? Hypertension   ? ? ?Past Surgical History: ?Past Surgical History:  ?Procedure Laterality Date  ? DILATION AND CURETTAGE OF UTERUS    ? POLYPECTOMY    ? PULMONARY THROMBECTOMY Bilateral 01/08/2022  ? Procedure: PULMONARY THROMBECTOMY;  Surgeon: Algernon Huxley, MD;  Location: [facility];  Service: Cardiovascular;  Laterality: Bilateral;  ?  ? ?OB History:  ?OB History  ?Gravida Para Term Preterm AB Living  ?'5 3 3   2 3  ' ?SAB IAB Ectopic Multiple Live Births  ?'1 1     3  ' ?  ?# Outcome Date GA Lbr Len/2nd Weight Sex Delivery Anes PTL Lv  ?5 SAB           ?4 IAB           ?3 Term      Vag-Spont   LIV  ?2 Term      Vag-Spont   LIV  ?1 Term      Vag-Spont   LIV  ? ? ?Family History: ?Family History  ?Problem Relation Age of Onset  ? Breast cancer Maternal Aunt   ?     2 aunts in their 53's  ? Breast cancer Maternal Aunt 31  ? Ovarian cancer Neg Hx   ? Colon cancer Neg Hx   ? ? ?Social History: ?Social History  ? ?  Socioeconomic History  ? Marital status: Divorced  ?  Spouse name: Not on file  ? Number of children: Not on file  ? Years of education: Not on file  ? Highest education level: Not on file  ?Occupational History  ? Occupation: retired  ?Tobacco Use  ? Smoking status: Never  ? Smokeless tobacco: Never  ?Vaping Use  ? Vaping Use: Never used  ?Substance and Sexual Activity  ? Alcohol use: No  ? Drug use: No  ? Sexual activity: Not Currently  ?  Birth control/protection: Post-menopausal  ?Other Topics Concern  ? Not on file  ?Social History Narrative  ? Not on file  ? ?Social Determinants of Health  ? ?Financial Resource Strain: Not on file  ?Food Insecurity: Not on file   ?Transportation Needs: Not on file  ?Physical Activity: Not on file  ?Stress: Not on file  ?Social Connections: Not on file  ?Intimate Partner Violence: Not on file  ? ? ?Allergies: ?Allergies  ?Allergen Ulyess Mort

## 2022-02-11 NOTE — Patient Outreach (Signed)
[facility]) Care Management ? ?02/11/2022 ? ?Kirah L Steck ?02-Jan-1946 ?[id number] ? ? ?Transition of Care Referral ?  ?Referral Date: 02/04/2022 ?Referral Source: [facility] Liaison ?Date of Discharge: 02/03/2022 ?Facility: [facility] ? ? ?Patient discharged from hospital to SNF. ? ? ?Plan: ?RN CM will close referral. ? ? ?[name], RN,BSN,CCM ?[facility] Care Management ?Telephonic Care Management Coordinator ?Direct Phone: [phone] ?Toll Free: [phone] ?Fax: [phone] ? ?

## 2022-02-12 DIAGNOSIS — K219 Gastro-esophageal reflux disease without esophagitis: Secondary | ICD-10-CM | POA: Diagnosis not present

## 2022-02-12 DIAGNOSIS — C541 Malignant neoplasm of endometrium: Secondary | ICD-10-CM | POA: Diagnosis not present

## 2022-02-12 DIAGNOSIS — F319 Bipolar disorder, unspecified: Secondary | ICD-10-CM | POA: Diagnosis not present

## 2022-02-12 DIAGNOSIS — I10 Essential (primary) hypertension: Secondary | ICD-10-CM | POA: Diagnosis not present

## 2022-02-12 DIAGNOSIS — E119 Type 2 diabetes mellitus without complications: Secondary | ICD-10-CM | POA: Diagnosis not present

## 2022-02-13 LAB — MISC LABCORP TEST (SEND OUT): Labcorp test code: 5367

## 2022-02-17 ENCOUNTER — Encounter: Payer: Self-pay | Admitting: Hospice and Palliative Medicine

## 2022-02-17 ENCOUNTER — Telehealth: Payer: Self-pay | Admitting: Hospice and Palliative Medicine

## 2022-02-17 NOTE — Telephone Encounter (Signed)
I had a long (30-minute) conversation with patient's daughter.  Daughter is interested in patient following up next week in our clinic to again address goals.  It was my understanding at the time of the last clinic visit that the patient was still undecided and would let us know if she wanted follow-up care.  Patient remains at [facility] and is having issues with poor pain control.  Discussed general options for pain management but the facility provider will need to be involved regarding prescribing.  I did call and leave a voicemail for the director of nursing at the facility.  I also spoke with the community palliative care NP and requested that they see the patient and help coordinate care and address symptom management. ?

## 2022-02-17 NOTE — Telephone Encounter (Signed)
Spoke with Pt Daughter and [facility] facility to infrom them upcoming appt with [facility]...KJ  ?

## 2022-02-20 ENCOUNTER — Telehealth: Payer: Self-pay

## 2022-02-20 ENCOUNTER — Telehealth: Payer: Self-pay | Admitting: *Deleted

## 2022-02-20 ENCOUNTER — Other Ambulatory Visit: Payer: Self-pay

## 2022-02-20 DIAGNOSIS — C541 Malignant neoplasm of endometrium: Secondary | ICD-10-CM

## 2022-02-20 DIAGNOSIS — R188 Other ascites: Secondary | ICD-10-CM

## 2022-02-20 NOTE — Telephone Encounter (Signed)
Faxed anticoagulation clearance to Dr. Doy Hutching office. I also faxed IR the pleurex of abdomen to get scheduled. Will wait for procedure to be scheduled prior to contacting hospice.  ?

## 2022-02-20 NOTE — Telephone Encounter (Signed)
I spoke with patient's daughter.  Patient remains at rehab but daughter states that patient has decided not to pursue chemotherapy or other cancer treatments and instead plan will be to discharge patient home on 5/8 with [facility].  Daughter reports that patient appears to be having reaccumulation of ascites with abdominal distention/pain.  The facility talk to patient about placement of a tunneled peritoneal catheter to allow for home drainage of ascites and patient was interested in this.  We will place order for IR consult. ?

## 2022-02-20 NOTE — Telephone Encounter (Signed)
error 

## 2022-02-20 NOTE — Telephone Encounter (Signed)
Sabrina [name] called stating that they do not need the appointment next week for Palliative care, but instead "need an order and appointment for a port to be inserted into her abdomen to draw fluid off so she can go home with hospice" Please return her call to discuss this request. ?

## 2022-02-20 NOTE — Telephone Encounter (Signed)
Please advise 

## 2022-02-20 NOTE — Telephone Encounter (Signed)
Anticoagulation clearance form faxed to Dr. Doy Hutching to get IR pleurex of abdomen done. Also, I have faxed form to IR scheduling to get procedure scheduled.  ?

## 2022-02-24 ENCOUNTER — Encounter: Payer: Self-pay | Admitting: Hospice and Palliative Medicine

## 2022-02-24 ENCOUNTER — Inpatient Hospital Stay: Payer: Medicare HMO | Admitting: Hospice and Palliative Medicine

## 2022-02-26 ENCOUNTER — Encounter: Payer: Self-pay | Admitting: Hospice and Palliative Medicine

## 2022-02-26 DIAGNOSIS — I10 Essential (primary) hypertension: Secondary | ICD-10-CM | POA: Diagnosis not present

## 2022-02-26 DIAGNOSIS — E119 Type 2 diabetes mellitus without complications: Secondary | ICD-10-CM | POA: Diagnosis not present

## 2022-02-26 DIAGNOSIS — C541 Malignant neoplasm of endometrium: Secondary | ICD-10-CM | POA: Diagnosis not present

## 2022-02-26 DIAGNOSIS — R5381 Other malaise: Secondary | ICD-10-CM | POA: Diagnosis not present

## 2022-02-26 DIAGNOSIS — K219 Gastro-esophageal reflux disease without esophagitis: Secondary | ICD-10-CM | POA: Diagnosis not present

## 2022-02-26 DIAGNOSIS — I2692 Saddle embolus of pulmonary artery without acute cor pulmonale: Secondary | ICD-10-CM | POA: Diagnosis not present

## 2022-02-26 DIAGNOSIS — F319 Bipolar disorder, unspecified: Secondary | ICD-10-CM | POA: Diagnosis not present

## 2022-02-26 DIAGNOSIS — N179 Acute kidney failure, unspecified: Secondary | ICD-10-CM | POA: Diagnosis not present

## 2022-02-26 NOTE — Telephone Encounter (Signed)
Called to follow up with Dr. Doy Hutching office on anticoag form. Nurse will return my call when available.  ?

## 2022-02-27 NOTE — Telephone Encounter (Signed)
Received message from Forestine Chute, IR nurse: "my MD IR reviewed this patient, only minimal drain  up to now, thus she is not a candidate at this time for Pleurx placement per DR El-Abd, I called and spoke with daughter." ? ?Daughter also sent the following Mychart message: After convincing her to have the procedure they are now saying it's too invasive and she doesn't qualify.  Why were we told by you of the procedure only to be turned down now.  She has filled up with fluid again.  If they feel she would die then say that but it was supposed to be a 30 minute procedure.   I'm so frustrated and she's now wanting to engage her health plan and others.   ? ?Called both numbers on chart and no answer. Replied to Mychart message from pt explaining why she was not eligible for PleurX cath. ? ?Will confirm with Josh, prior to cancelling procedure request.   ?

## 2022-02-27 NOTE — Telephone Encounter (Signed)
Offered to set up paracentesis for pt. Pending response.  ?

## 2022-03-02 ENCOUNTER — Other Ambulatory Visit: Payer: Self-pay

## 2022-03-02 DIAGNOSIS — R188 Other ascites: Secondary | ICD-10-CM

## 2022-03-02 NOTE — Telephone Encounter (Signed)
Para scheduled for Wed 5/10 @ 2:30p. Unable to reach pt/daughter by phone. VM left and Mychart message left.  ?

## 2022-03-04 ENCOUNTER — Ambulatory Visit: Admission: RE | Admit: 2022-03-04 | Payer: Medicare HMO | Source: Ambulatory Visit

## 2022-03-05 ENCOUNTER — Telehealth: Payer: Self-pay

## 2022-03-05 NOTE — Telephone Encounter (Signed)
called and spoke to [place], daughter, and informed her that per Renee Ramus nurse, pt would need another Paracentesis showing the need for Pleurx. After Para, IR doctor will re-evaluate. [name] verbalized understanding. Pt scheduled for Para on 5/17 @ 1p with arrival time of 12:30p.  ?

## 2022-03-08 ENCOUNTER — Encounter: Payer: Self-pay | Admitting: Emergency Medicine

## 2022-03-08 ENCOUNTER — Emergency Department: Payer: Medicare Other

## 2022-03-08 DIAGNOSIS — Z888 Allergy status to other drugs, medicaments and biological substances status: Secondary | ICD-10-CM

## 2022-03-08 DIAGNOSIS — F0392 Unspecified dementia, unspecified severity, with psychotic disturbance: Secondary | ICD-10-CM | POA: Diagnosis present

## 2022-03-08 DIAGNOSIS — D75839 Thrombocytosis, unspecified: Secondary | ICD-10-CM | POA: Diagnosis present

## 2022-03-08 DIAGNOSIS — N136 Pyonephrosis: Secondary | ICD-10-CM | POA: Diagnosis present

## 2022-03-08 DIAGNOSIS — E875 Hyperkalemia: Secondary | ICD-10-CM | POA: Diagnosis present

## 2022-03-08 DIAGNOSIS — J90 Pleural effusion, not elsewhere classified: Secondary | ICD-10-CM | POA: Diagnosis not present

## 2022-03-08 DIAGNOSIS — E1122 Type 2 diabetes mellitus with diabetic chronic kidney disease: Secondary | ICD-10-CM | POA: Diagnosis present

## 2022-03-08 DIAGNOSIS — C799 Secondary malignant neoplasm of unspecified site: Secondary | ICD-10-CM | POA: Diagnosis present

## 2022-03-08 DIAGNOSIS — Z8542 Personal history of malignant neoplasm of other parts of uterus: Secondary | ICD-10-CM

## 2022-03-08 DIAGNOSIS — R1115 Cyclical vomiting syndrome unrelated to migraine: Secondary | ICD-10-CM | POA: Diagnosis present

## 2022-03-08 DIAGNOSIS — N189 Chronic kidney disease, unspecified: Secondary | ICD-10-CM | POA: Diagnosis not present

## 2022-03-08 DIAGNOSIS — Z66 Do not resuscitate: Secondary | ICD-10-CM | POA: Diagnosis not present

## 2022-03-08 DIAGNOSIS — I129 Hypertensive chronic kidney disease with stage 1 through stage 4 chronic kidney disease, or unspecified chronic kidney disease: Secondary | ICD-10-CM | POA: Diagnosis present

## 2022-03-08 DIAGNOSIS — R109 Unspecified abdominal pain: Secondary | ICD-10-CM | POA: Diagnosis present

## 2022-03-08 DIAGNOSIS — R410 Disorientation, unspecified: Secondary | ICD-10-CM | POA: Diagnosis not present

## 2022-03-08 DIAGNOSIS — R9431 Abnormal electrocardiogram [ECG] [EKG]: Secondary | ICD-10-CM | POA: Diagnosis not present

## 2022-03-08 DIAGNOSIS — N1831 Chronic kidney disease, stage 3a: Secondary | ICD-10-CM | POA: Diagnosis present

## 2022-03-08 DIAGNOSIS — Z7984 Long term (current) use of oral hypoglycemic drugs: Secondary | ICD-10-CM

## 2022-03-08 DIAGNOSIS — Z86711 Personal history of pulmonary embolism: Secondary | ICD-10-CM | POA: Diagnosis not present

## 2022-03-08 DIAGNOSIS — E860 Dehydration: Secondary | ICD-10-CM | POA: Diagnosis present

## 2022-03-08 DIAGNOSIS — F319 Bipolar disorder, unspecified: Secondary | ICD-10-CM | POA: Diagnosis present

## 2022-03-08 DIAGNOSIS — K920 Hematemesis: Secondary | ICD-10-CM | POA: Diagnosis not present

## 2022-03-08 DIAGNOSIS — Z79899 Other long term (current) drug therapy: Secondary | ICD-10-CM

## 2022-03-08 DIAGNOSIS — Z880 Allergy status to penicillin: Secondary | ICD-10-CM

## 2022-03-08 DIAGNOSIS — R0902 Hypoxemia: Secondary | ICD-10-CM | POA: Diagnosis not present

## 2022-03-08 DIAGNOSIS — N179 Acute kidney failure, unspecified: Principal | ICD-10-CM | POA: Diagnosis present

## 2022-03-08 DIAGNOSIS — Z7901 Long term (current) use of anticoagulants: Secondary | ICD-10-CM

## 2022-03-08 DIAGNOSIS — E1169 Type 2 diabetes mellitus with other specified complication: Secondary | ICD-10-CM | POA: Diagnosis present

## 2022-03-08 DIAGNOSIS — Z885 Allergy status to narcotic agent status: Secondary | ICD-10-CM

## 2022-03-08 DIAGNOSIS — R748 Abnormal levels of other serum enzymes: Secondary | ICD-10-CM | POA: Diagnosis present

## 2022-03-08 DIAGNOSIS — D630 Anemia in neoplastic disease: Secondary | ICD-10-CM | POA: Diagnosis present

## 2022-03-08 DIAGNOSIS — R180 Malignant ascites: Secondary | ICD-10-CM | POA: Diagnosis present

## 2022-03-08 DIAGNOSIS — Z515 Encounter for palliative care: Secondary | ICD-10-CM | POA: Diagnosis not present

## 2022-03-08 DIAGNOSIS — D631 Anemia in chronic kidney disease: Secondary | ICD-10-CM | POA: Diagnosis present

## 2022-03-08 DIAGNOSIS — N1330 Unspecified hydronephrosis: Secondary | ICD-10-CM | POA: Diagnosis not present

## 2022-03-08 DIAGNOSIS — C541 Malignant neoplasm of endometrium: Secondary | ICD-10-CM | POA: Diagnosis present

## 2022-03-08 DIAGNOSIS — E872 Acidosis, unspecified: Secondary | ICD-10-CM | POA: Diagnosis present

## 2022-03-08 DIAGNOSIS — F0393 Unspecified dementia, unspecified severity, with mood disturbance: Secondary | ICD-10-CM | POA: Diagnosis present

## 2022-03-08 DIAGNOSIS — E7800 Pure hypercholesterolemia, unspecified: Secondary | ICD-10-CM | POA: Diagnosis present

## 2022-03-08 DIAGNOSIS — R188 Other ascites: Secondary | ICD-10-CM | POA: Diagnosis not present

## 2022-03-08 DIAGNOSIS — R1084 Generalized abdominal pain: Secondary | ICD-10-CM | POA: Diagnosis not present

## 2022-03-08 DIAGNOSIS — R1110 Vomiting, unspecified: Secondary | ICD-10-CM | POA: Diagnosis not present

## 2022-03-08 DIAGNOSIS — R112 Nausea with vomiting, unspecified: Secondary | ICD-10-CM | POA: Diagnosis not present

## 2022-03-08 DIAGNOSIS — R52 Pain, unspecified: Secondary | ICD-10-CM | POA: Diagnosis not present

## 2022-03-08 LAB — CBC
HCT: 35.2 % — ABNORMAL LOW (ref 36.0–46.0)
Hemoglobin: 10.6 g/dL — ABNORMAL LOW (ref 12.0–15.0)
MCH: 27.7 pg (ref 26.0–34.0)
MCHC: 30.1 g/dL (ref 30.0–36.0)
MCV: 91.9 fL (ref 80.0–100.0)
Platelets: 658 10*3/uL — ABNORMAL HIGH (ref 150–400)
RBC: 3.83 MIL/uL — ABNORMAL LOW (ref 3.87–5.11)
RDW: 17.5 % — ABNORMAL HIGH (ref 11.5–15.5)
WBC: 6.9 10*3/uL (ref 4.0–10.5)
nRBC: 0 % (ref 0.0–0.2)

## 2022-03-08 LAB — URINALYSIS, ROUTINE W REFLEX MICROSCOPIC
Bilirubin Urine: NEGATIVE
Glucose, UA: 50 mg/dL — AB
Ketones, ur: 5 mg/dL — AB
Nitrite: NEGATIVE
Protein, ur: 30 mg/dL — AB
Specific Gravity, Urine: 1.023 (ref 1.005–1.030)
WBC, UA: >50 WBC/hpf — ABNORMAL HIGH (ref 0–5)
pH: 5 (ref 5.0–8.0)

## 2022-03-08 LAB — COMPREHENSIVE METABOLIC PANEL
ALT: 8 U/L (ref 0–44)
AST: 21 U/L (ref 15–41)
Albumin: 2.9 g/dL — ABNORMAL LOW (ref 3.5–5.0)
Alkaline Phosphatase: 57 U/L (ref 38–126)
Anion gap: 16 — ABNORMAL HIGH (ref 5–15)
BUN: 68 mg/dL — ABNORMAL HIGH (ref 8–23)
CO2: 18 mmol/L — ABNORMAL LOW (ref 22–32)
Calcium: 9.6 mg/dL (ref 8.9–10.3)
Chloride: 107 mmol/L (ref 98–111)
Creatinine, Ser: 2.73 mg/dL — ABNORMAL HIGH (ref 0.44–1.00)
GFR, Estimated: 18 mL/min — ABNORMAL LOW (ref >60)
Glucose, Bld: 147 mg/dL — ABNORMAL HIGH (ref 70–99)
Potassium: 6.5 mmol/L (ref 3.5–5.1)
Sodium: 141 mmol/L (ref 135–145)
Total Bilirubin: 1.1 mg/dL (ref 0.3–1.2)
Total Protein: 7 g/dL (ref 6.5–8.1)

## 2022-03-08 LAB — LIPASE, BLOOD: Lipase: 142 U/L — ABNORMAL HIGH (ref 11–51)

## 2022-03-08 LAB — CBG MONITORING, ED: Glucose-Capillary: 139 mg/dL — ABNORMAL HIGH (ref 70–99)

## 2022-03-08 NOTE — ED Triage Notes (Signed)
Pt was suppose to have a paracentesis this past Friday but it was cancelled   ?

## 2022-03-08 NOTE — ED Triage Notes (Signed)
First nurse note. From home via [facility] with c/o vomiting x 3 days with abd pain starting today. Hx of ascitics and uterine cancer. EMS reports distended abd. ?

## 2022-03-09 ENCOUNTER — Inpatient Hospital Stay: Payer: Medicare Other

## 2022-03-09 ENCOUNTER — Inpatient Hospital Stay
Admission: EM | Admit: 2022-03-09 | Discharge: 2022-03-15 | DRG: 683 | Disposition: A | Discharge status: Hospice | Payer: Medicare Other | Attending: Internal Medicine | Admitting: Internal Medicine

## 2022-03-09 ENCOUNTER — Emergency Department: Payer: Medicare Other

## 2022-03-09 DIAGNOSIS — E875 Hyperkalemia: Secondary | ICD-10-CM | POA: Diagnosis present

## 2022-03-09 DIAGNOSIS — R180 Malignant ascites: Secondary | ICD-10-CM | POA: Diagnosis present

## 2022-03-09 DIAGNOSIS — R1110 Vomiting, unspecified: Secondary | ICD-10-CM | POA: Diagnosis not present

## 2022-03-09 DIAGNOSIS — C799 Secondary malignant neoplasm of unspecified site: Secondary | ICD-10-CM | POA: Diagnosis present

## 2022-03-09 DIAGNOSIS — R109 Unspecified abdominal pain: Secondary | ICD-10-CM | POA: Diagnosis present

## 2022-03-09 DIAGNOSIS — K7031 Alcoholic cirrhosis of liver with ascites: Secondary | ICD-10-CM | POA: Insufficient documentation

## 2022-03-09 DIAGNOSIS — N189 Chronic kidney disease, unspecified: Secondary | ICD-10-CM | POA: Diagnosis not present

## 2022-03-09 DIAGNOSIS — Z7189 Other specified counseling: Secondary | ICD-10-CM

## 2022-03-09 DIAGNOSIS — D630 Anemia in neoplastic disease: Secondary | ICD-10-CM | POA: Diagnosis present

## 2022-03-09 DIAGNOSIS — R112 Nausea with vomiting, unspecified: Secondary | ICD-10-CM | POA: Diagnosis not present

## 2022-03-09 DIAGNOSIS — F0393 Unspecified dementia, unspecified severity, with mood disturbance: Secondary | ICD-10-CM | POA: Diagnosis present

## 2022-03-09 DIAGNOSIS — N179 Acute kidney failure, unspecified: Secondary | ICD-10-CM

## 2022-03-09 DIAGNOSIS — F0392 Unspecified dementia, unspecified severity, with psychotic disturbance: Secondary | ICD-10-CM | POA: Diagnosis present

## 2022-03-09 DIAGNOSIS — E1122 Type 2 diabetes mellitus with diabetic chronic kidney disease: Secondary | ICD-10-CM | POA: Diagnosis present

## 2022-03-09 DIAGNOSIS — D631 Anemia in chronic kidney disease: Secondary | ICD-10-CM | POA: Diagnosis present

## 2022-03-09 DIAGNOSIS — I129 Hypertensive chronic kidney disease with stage 1 through stage 4 chronic kidney disease, or unspecified chronic kidney disease: Secondary | ICD-10-CM | POA: Diagnosis present

## 2022-03-09 DIAGNOSIS — E872 Acidosis, unspecified: Secondary | ICD-10-CM | POA: Diagnosis present

## 2022-03-09 DIAGNOSIS — Z8542 Personal history of malignant neoplasm of other parts of uterus: Secondary | ICD-10-CM | POA: Diagnosis not present

## 2022-03-09 DIAGNOSIS — F319 Bipolar disorder, unspecified: Secondary | ICD-10-CM | POA: Diagnosis present

## 2022-03-09 DIAGNOSIS — Z515 Encounter for palliative care: Secondary | ICD-10-CM | POA: Diagnosis not present

## 2022-03-09 DIAGNOSIS — N390 Urinary tract infection, site not specified: Secondary | ICD-10-CM | POA: Diagnosis present

## 2022-03-09 DIAGNOSIS — C541 Malignant neoplasm of endometrium: Principal | ICD-10-CM | POA: Diagnosis present

## 2022-03-09 DIAGNOSIS — Z66 Do not resuscitate: Secondary | ICD-10-CM | POA: Diagnosis not present

## 2022-03-09 DIAGNOSIS — E8729 Other acidosis: Secondary | ICD-10-CM | POA: Diagnosis present

## 2022-03-09 DIAGNOSIS — E860 Dehydration: Secondary | ICD-10-CM

## 2022-03-09 DIAGNOSIS — N136 Pyonephrosis: Secondary | ICD-10-CM | POA: Diagnosis present

## 2022-03-09 DIAGNOSIS — D75839 Thrombocytosis, unspecified: Secondary | ICD-10-CM | POA: Diagnosis present

## 2022-03-09 DIAGNOSIS — E1169 Type 2 diabetes mellitus with other specified complication: Secondary | ICD-10-CM | POA: Diagnosis present

## 2022-03-09 DIAGNOSIS — N1831 Chronic kidney disease, stage 3a: Secondary | ICD-10-CM | POA: Diagnosis present

## 2022-03-09 DIAGNOSIS — E119 Type 2 diabetes mellitus without complications: Secondary | ICD-10-CM

## 2022-03-09 DIAGNOSIS — R748 Abnormal levels of other serum enzymes: Secondary | ICD-10-CM | POA: Diagnosis present

## 2022-03-09 DIAGNOSIS — Z7901 Long term (current) use of anticoagulants: Secondary | ICD-10-CM

## 2022-03-09 DIAGNOSIS — Z86711 Personal history of pulmonary embolism: Secondary | ICD-10-CM | POA: Diagnosis not present

## 2022-03-09 DIAGNOSIS — E7800 Pure hypercholesterolemia, unspecified: Secondary | ICD-10-CM | POA: Diagnosis present

## 2022-03-09 DIAGNOSIS — R1115 Cyclical vomiting syndrome unrelated to migraine: Secondary | ICD-10-CM | POA: Diagnosis present

## 2022-03-09 DIAGNOSIS — Z8659 Personal history of other mental and behavioral disorders: Secondary | ICD-10-CM

## 2022-03-09 LAB — COMPREHENSIVE METABOLIC PANEL
ALT: 7 U/L (ref 0–44)
AST: 16 U/L (ref 15–41)
Albumin: 3.1 g/dL — ABNORMAL LOW (ref 3.5–5.0)
Alkaline Phosphatase: 63 U/L (ref 38–126)
Anion gap: 19 — ABNORMAL HIGH (ref 5–15)
BUN: 67 mg/dL — ABNORMAL HIGH (ref 8–23)
CO2: 14 mmol/L — ABNORMAL LOW (ref 22–32)
Calcium: 9.9 mg/dL (ref 8.9–10.3)
Chloride: 109 mmol/L (ref 98–111)
Creatinine, Ser: 2.64 mg/dL — ABNORMAL HIGH (ref 0.44–1.00)
GFR, Estimated: 18 mL/min — ABNORMAL LOW (ref >60)
Glucose, Bld: 152 mg/dL — ABNORMAL HIGH (ref 70–99)
Potassium: 6.1 mmol/L — ABNORMAL HIGH (ref 3.5–5.1)
Sodium: 142 mmol/L (ref 135–145)
Total Bilirubin: 0.9 mg/dL (ref 0.3–1.2)
Total Protein: 7.3 g/dL (ref 6.5–8.1)

## 2022-03-09 LAB — BASIC METABOLIC PANEL
Anion gap: 17 — ABNORMAL HIGH (ref 5–15)
Anion gap: 18 — ABNORMAL HIGH (ref 5–15)
BUN: 66 mg/dL — ABNORMAL HIGH (ref 8–23)
BUN: 56 mg/dL — ABNORMAL HIGH (ref 8–23)
CO2: 15 mmol/L — ABNORMAL LOW (ref 22–32)
CO2: 20 mmol/L — ABNORMAL LOW (ref 22–32)
Calcium: 9.1 mg/dL (ref 8.9–10.3)
Calcium: 8.7 mg/dL — ABNORMAL LOW (ref 8.9–10.3)
Chloride: 110 mmol/L (ref 98–111)
Chloride: 105 mmol/L (ref 98–111)
Creatinine, Ser: 2.32 mg/dL — ABNORMAL HIGH (ref 0.44–1.00)
Creatinine, Ser: 1.95 mg/dL — ABNORMAL HIGH (ref 0.44–1.00)
GFR, Estimated: 21 mL/min — ABNORMAL LOW (ref >60)
GFR, Estimated: 26 mL/min — ABNORMAL LOW (ref >60)
Glucose, Bld: 146 mg/dL — ABNORMAL HIGH (ref 70–99)
Glucose, Bld: 116 mg/dL — ABNORMAL HIGH (ref 70–99)
Potassium: 5.6 mmol/L — ABNORMAL HIGH (ref 3.5–5.1)
Potassium: 5.1 mmol/L (ref 3.5–5.1)
Sodium: 142 mmol/L (ref 135–145)
Sodium: 143 mmol/L (ref 135–145)

## 2022-03-09 LAB — CBG MONITORING, ED
Glucose-Capillary: 131 mg/dL — ABNORMAL HIGH (ref 70–99)
Glucose-Capillary: 111 mg/dL — ABNORMAL HIGH (ref 70–99)
Glucose-Capillary: 103 mg/dL — ABNORMAL HIGH (ref 70–99)

## 2022-03-09 LAB — GLUCOSE, CAPILLARY: Glucose-Capillary: 111 mg/dL — ABNORMAL HIGH (ref 70–99)

## 2022-03-09 LAB — LACTIC ACID, PLASMA
Lactic Acid, Venous: 2.9 mmol/L (ref 0.5–1.9)
Lactic Acid, Venous: 2.7 mmol/L (ref 0.5–1.9)
Lactic Acid, Venous: 2 mmol/L (ref 0.5–1.9)

## 2022-03-09 LAB — MAGNESIUM: Magnesium: 2.8 mg/dL — ABNORMAL HIGH (ref 1.7–2.4)

## 2022-03-09 MED ORDER — DROPERIDOL 2.5 MG/ML IJ SOLN
1.2500 mg | Freq: Once | INTRAMUSCULAR | Status: AC
  Administered 2022-03-09: 1.25 mg via INTRAVENOUS
  Filled 2022-03-09: qty 2

## 2022-03-09 MED ORDER — APIXABAN 5 MG PO TABS
5.0000 mg | ORAL_TABLET | Freq: Two times a day (BID) | ORAL | Status: DC
Start: 2022-03-09 — End: 2022-03-09

## 2022-03-09 MED ORDER — SODIUM CHLORIDE 0.9 % IV SOLN
2.0000 g | INTRAVENOUS | Status: DC
  Administered 2022-03-09 – 2022-03-10 (×2): 2 g via INTRAVENOUS
  Filled 2022-03-09: qty 20
  Filled 2022-03-09 (×2): qty 2

## 2022-03-09 MED ORDER — SODIUM ZIRCONIUM CYCLOSILICATE 10 G PO PACK
10.0000 g | PACK | ORAL | Status: AC
  Administered 2022-03-09: 10 g via ORAL
  Filled 2022-03-09: qty 1

## 2022-03-09 MED ORDER — LACTATED RINGERS IV BOLUS
1000.0000 mL | Freq: Once | INTRAVENOUS | Status: AC
Start: 2022-03-09 — End: 2022-03-09
  Administered 2022-03-09: 1000 mL via INTRAVENOUS

## 2022-03-09 MED ORDER — PROPRANOLOL HCL ER 60 MG PO CP24
60.0000 mg | ORAL_CAPSULE | Freq: Every day | ORAL | Status: DC
  Administered 2022-03-09 – 2022-03-10 (×2): 60 mg via ORAL
  Filled 2022-03-09 (×2): qty 1

## 2022-03-09 MED ORDER — INSULIN ASPART 100 UNIT/ML IJ SOLN
0.0000 [IU] | Freq: Three times a day (TID) | INTRAMUSCULAR | Status: DC
  Administered 2022-03-09: 1 [IU] via SUBCUTANEOUS

## 2022-03-09 MED ORDER — ONDANSETRON HCL 4 MG/2ML IJ SOLN
4.0000 mg | Freq: Four times a day (QID) | INTRAMUSCULAR | Status: DC | PRN
  Administered 2022-03-09 – 2022-03-13 (×11): 4 mg via INTRAVENOUS
  Filled 2022-03-09 (×11): qty 2

## 2022-03-09 MED ORDER — INSULIN ASPART 100 UNIT/ML IJ SOLN: 0.0000 [IU] | Freq: Every day | INTRAMUSCULAR | Status: DC

## 2022-03-09 MED ORDER — OLANZAPINE 5 MG PO TBDP
2.5000 mg | ORAL_TABLET | Freq: Two times a day (BID) | ORAL | Status: DC
Start: 2022-03-09 — End: 2022-03-15
  Administered 2022-03-09 – 2022-03-14 (×12): 2.5 mg via ORAL
  Filled 2022-03-09 (×3): qty 0.5
  Filled 2022-03-09: qty 1
  Filled 2022-03-09 (×9): qty 0.5

## 2022-03-09 MED ORDER — SODIUM BICARBONATE 8.4 % IV SOLN
INTRAVENOUS | Status: AC
  Filled 2022-03-09: qty 150
  Filled 2022-03-09: qty 1000

## 2022-03-09 MED ORDER — CEFTRIAXONE SODIUM 1 G IJ SOLR
1.0000 g | INTRAMUSCULAR | Status: AC
  Administered 2022-03-09: 1 g via INTRAVENOUS
  Filled 2022-03-09: qty 10

## 2022-03-09 MED ORDER — LACTATED RINGERS IV BOLUS: 1000.0000 mL | Freq: Once | INTRAVENOUS | Status: DC

## 2022-03-09 MED ORDER — SODIUM CHLORIDE 0.9 % IV SOLN: INTRAVENOUS | Status: DC

## 2022-03-09 MED ORDER — SODIUM CHLORIDE 0.9 % IV BOLUS
1000.0000 mL | Freq: Once | INTRAVENOUS | Status: AC
  Administered 2022-03-09: 1000 mL via INTRAVENOUS

## 2022-03-09 MED ORDER — PANTOPRAZOLE SODIUM 40 MG PO TBEC
40.0000 mg | DELAYED_RELEASE_TABLET | Freq: Every day | ORAL | Status: DC
Start: 2022-03-09 — End: 2022-03-14
  Administered 2022-03-09 – 2022-03-11 (×3): 40 mg via ORAL
  Filled 2022-03-09 (×5): qty 1

## 2022-03-09 NOTE — Progress Notes (Signed)
Brief progress note, patient had history and physical completed early this morning by overnight physician ? ?S: Patient seen and examined resting in bed in ED, daughter is present.  Patient states she is tired, abdominal pain is improved after paracentesis, daughter has a lot of concerns about hospice being canceled because of the paracentesis. ? ?O: Labs reviewed as below, bicarb has been administered, pending repeat BMP.  Abdomen is nontender, nondistended.  Patient is in no apparent distress, alert/oriented.  Cardiac: Regular rate rhythm, lungs CTA BL ? ?A/P: Continue current interventions, treatment for nausea/vomiting and advance diet as tolerated, clear liquid diet was ordered, patient was requesting something to drink/ice chips.  Repeat BMP pending to evaluate AKI, metabolic acidosis, hyperkalemia.  Repeat lactic acidosis and if persistently elevated may need to broaden antibiotic coverage.  Patient is remaining on ceftriaxone for concern for SBP/UTI.  ? ? ?

## 2022-03-09 NOTE — Assessment & Plan Note (Addendum)
   Suspect reactive secondary to vomiting  Pancreas within normal limits on CT

## 2022-03-09 NOTE — Assessment & Plan Note (Addendum)
   Patient at times refusing to take her p.o. meds including Zyprexa.  She is much more aggressive and paranoid over the last 1 to 2 days.  The decisions for CODE STATUS and comfort care came from her daughter and brother, although both wanted patient to be amenable to this plan.  Although this was difficult as the patient was paranoid and suspicious, the patient does understand that she is dying and she does not want anything further done.  Stopped most of her medicines, we will continue Zyprexa either by p.o. or IM to help with her paranoia and aggressiveness.

## 2022-03-09 NOTE — H&P (Signed)
?History and Physical  ? ? ?Patient: Sabrina [name] ZOX:[id number] DOB: [birth year] ?DOA: 03/09/2022 ?DOS: the patient was seen and examined on 03/09/2022 ?PCP: System, Provider Not In  ?Patient coming from: Home ? ?Chief Complaint:  ?Chief Complaint  ?Patient presents with  ? Abdominal Pain  ? ? ?HPI: Sabrina [name] is a 76 y.o. female with medical history significant for DM, stage IV adenocarcinoma of the endometrium, bipolar affective disorder, on anticoagulation with Eliquis for saddle embolism, hospitalized in April 2023 with AKI with creatinine peaking at 3.7, who was brought to the ED by EMS with a 3-day history of vomiting and abdominal pain that started on the day of arrival.  Patient has ascites related to her endometrial cancer and was scheduled for paracentesis on 03/06/2022 but missed the appointment.  She reports abdominal distention.  She denies fever or chills, diarrhea or dysuria. ?ED course and data review: On arrival vitals within normal limits though she did briefly become tachycardic to 121.  Labs significant for normal WBC of 6.9 but with lactic acidosis of 2.9.  Hemoglobin 10.6.  Lipase 142 but LFTs within normal limits.  BUN/creatinine 67/2.64 up from baseline creatinine of 1.06.  Bicarb 14, anion gap 19 and potassium 6.1.  Magnesium 2.9.  Urinalysis with large leukocyte esterases greater than 50 WBC per hpf and rare bacteria.  Chest x-ray showing moderate right and small left pleural effusions that are chronic and CT abdomen and pelvis with contrast showing the following findings ? ?Patient treated with Rocephin at 2 L LR bolus and Lokelma packet.  Hospitalist consulted for admission.  ? ?Review of Systems: As mentioned in the history of present illness. All other systems reviewed and are negative. ?Past Medical History:  ?Diagnosis Date  ? Diabetes mellitus without complication ([place])   ? Endometrial cancer ([place]) 01/29/2022  ? High cholesterol   ? Hypertension   ? ?Past Surgical History:   ?Procedure Laterality Date  ? DILATION AND CURETTAGE OF UTERUS    ? POLYPECTOMY    ? PULMONARY THROMBECTOMY Bilateral 01/08/2022  ? Procedure: PULMONARY THROMBECTOMY;  Surgeon: Algernon Huxley, MD;  Location: [facility];  Service: Cardiovascular;  Laterality: Bilateral;  ? ?Social History:  reports that she has never smoked. She has never used smokeless tobacco. She reports that she does not drink alcohol and does not use drugs. ? ?Allergies  ?Allergen Reactions  ? Glimepiride Diarrhea and Other (See Comments)  ? Glipizide Other (See Comments)  ? Lisinopril Other (See Comments)  ?  headache  ? Losartan Other (See Comments)  ?  Headache  ? Norco [Hydrocodone-Acetaminophen] Other (See Comments)  ?  Mood changes  ? Pioglitazone Swelling  ?  On legs  ? Penicillins Diarrhea and Rash  ? ? ?Family History  ?Problem Relation Age of Onset  ? Breast cancer Maternal Aunt   ?     2 aunts in their 63's  ? Breast cancer Maternal Aunt 32  ? Ovarian cancer Neg Hx   ? Colon cancer Neg Hx   ? ? ?Prior to Admission medications   ?Medication Sig Start Date End Date Taking? Authorizing Provider  ?Accu-Chek Softclix Lancets lancets  01/23/22   [provider]  ?Accu-Chek Softclix Lancets lancets Use to check blood sugar twice a day as directed. 01/23/22   [provider]  ?apixaban (ELIQUIS) 5 MG TABS tablet Take one tablet (5 mg) by mouth twice daily 02/03/22   Nicole Kindred A, DO  ?metFORMIN (GLUCOPHAGE) 1000  MG tablet Take 1,000 mg by mouth daily. 02/10/22   [provider]  ?metFORMIN (GLUCOPHAGE) 500 MG tablet Take 2 tablets (1,000 mg total) by mouth daily. 02/10/22   Shawna Clamp, MD  ?OLANZapine zydis (ZYPREXA) 5 MG disintegrating tablet Take 0.5 tablets (2.5 mg total) by mouth 2 (two) times daily. 02/09/22   Shawna Clamp, MD  ?ondansetron (ZOFRAN-ODT) 4 MG disintegrating tablet Take by mouth. 01/04/22   [provider]  ?pantoprazole (PROTONIX) 40 MG tablet Take 1 tablet (40 mg total) by  mouth daily. 01/11/22   Loletha Grayer, MD  ?polyethylene glycol (MIRALAX / GLYCOLAX) 17 g packet Take 17 g by mouth daily as needed for moderate constipation. 01/11/22   Loletha Grayer, MD  ?propranolol ER (INDERAL LA) 60 MG 24 hr capsule Take 60 mg by mouth daily.    [provider]  ? ? ?Physical Exam: ?Vitals:  ? 03/09/22 0100 03/09/22 0200 03/09/22 0230 03/09/22 0300  ?BP: (!) 143/96 118/68 120/65 117/67  ?Pulse: (!) 121 87 84 87  ?Resp: '15 12 11 15  '$ ?Temp:      ?TempSrc:      ?SpO2: 100% 100% 100% 100%  ? ?Physical Exam ?Vitals and nursing note reviewed.  ?Constitutional:   ?   General: She is awake. She is not in acute distress. ?   Comments: Elderly, frail-appearing female, actively vomiting  ?HENT:  ?   Head: Normocephalic and atraumatic.  ?Cardiovascular:  ?   Rate and Rhythm: Normal rate and regular rhythm.  ?   Heart sounds: Normal heart sounds.  ?Pulmonary:  ?   Effort: Pulmonary effort is normal.  ?   Breath sounds: Normal breath sounds.  ?Abdominal:  ?   General: There is distension.  ?   Palpations: Abdomen is soft.  ?   Tenderness: There is no abdominal tenderness.  ?Neurological:  ?   Mental Status: She is alert. Mental status is at baseline.  ? ? ? ?Data Reviewed: ?Relevant notes from primary care and specialist visits, past discharge summaries as available in EHR, including Care Everywhere. ?Prior diagnostic testing as pertinent to current admission diagnoses ?Updated medications and problem lists for reconciliation ?ED course, including vitals, labs, imaging, treatment and response to treatment ?Triage notes, nursing and pharmacy notes and ED provider's notes ?Notable results as noted in HPI ? ? ?Assessment and Plan: ?* Intractable vomiting ?N.p.o. except for ice chips, antiemetics, supportive care ?IV hydration ? ?Urinary tract infection ?IV Rocephin and follow cultures ? ?Acute kidney injury superimposed on chronic kidney disease llla(HCC) ?Creatinine 2.7 above baseline of 1.06,  prerenal secondary to intractable vomiting ?Hydrate and monitor, avoiding nephrotoxic drugs and renally dosing all meds ? ?Ascites, malignant ?CT abdomen and pelvis showing large volume malignant abdominopelvic ascites with omental caking and peritoneal implants, grossly unchanged ?Patient missed paracentesis on 5/12, rescheduled for 5/17 ?Will consult IR for paracentesis ? ?High anion gap metabolic acidosis ?IV hydration with sodium bicarb continue to monitor ? ?Type 2 diabetes mellitus with hyperlipidemia ([place]) ?Sliding scale insulin coverage ? ?Hyperkalemia ?Received a dose of Lokelma in the ED.  Continue to monitor and correct ?Continuous cardiac monitoring ? ?Lactic acidosis ?Possibly related to sepsis possibly intractable nausea and vomiting ?IV hydration and monitor ?Monitor for any additional signs to suggest sepsis ? ?Elevated lipase ?Suspect reactive but continue to trend ?Pancreas within normal limits on CT ? ?Thrombocytosis ?Likely reactive ? ?Chronic anticoagulation secondary to history of saddle embolus ?Holding a.m. apixaban dose for paracentesis ? ?Endometrial cancer (  [place]) ?CT abdomen and pelvis showing large volume malignant abdominopelvic ascites with omental caking and peritoneal implants, grossly unchanged ?Patient missed paracentesis on 5/12, rescheduled for 5/17 ?Will consult IR for paracentesis ? ?History of behavioral and mental health problems ?Continue home meds pending med rec ? ?Diabetes mellitus without complication ([place]) ?Sliding scale insulin coverage ? ? ? ?Advance Care Planning:   Code Status: Prior  ? ?Consults: none ? ?Family Communication: Daughter at bedside ? ?Severity of Illness: ?The appropriate patient status for this patient is INPATIENT. Inpatient status is judged to be reasonable and necessary in order to provide the required intensity of service to ensure the patient's safety. The patient's presenting symptoms, physical exam findings, and initial radiographic and  laboratory data in the context of their chronic comorbidities is felt to place them at high risk for further clinical deterioration. Furthermore, it is not anticipated that the patient will be medically stable for dischar

## 2022-03-09 NOTE — Assessment & Plan Note (Addendum)
?   Resolved, secondary to intractable nausea and vomiting.  Sepsis has been ruled out. ?

## 2022-03-09 NOTE — Assessment & Plan Note (Addendum)
Sliding scale insulin coverage 

## 2022-03-09 NOTE — Assessment & Plan Note (Addendum)
   CT abdomen and pelvis showing large volume malignant abdominopelvic ascites with omental caking and peritoneal implants, grossly unchanged.  Unfortunately, patient's nausea and now lack of p.o. intake indicate that she is much closer to death.  Had extensive discussion with patient's family and then with patient.  As per their wishes, patient is now comfort care and will make arrangements to look at hospice facility.  Initial plan to place drain for recurrent ascites now discontinued since she is now comfort care.

## 2022-03-09 NOTE — ED Notes (Signed)
IV attempt x 2 left hand and right AC without success. Pt tolerated well. Called charge RN to try. ?

## 2022-03-09 NOTE — ED Notes (Signed)
Informed RN bed assigned 

## 2022-03-09 NOTE — ED Notes (Signed)
Pt remains gone to IR.  ?

## 2022-03-09 NOTE — ED Notes (Signed)
Dr Forbach at the bedside. 

## 2022-03-09 NOTE — ED Notes (Signed)
Dr. Duncan at the bedside  

## 2022-03-09 NOTE — ED Notes (Signed)
Lab at the bedside 

## 2022-03-09 NOTE — Assessment & Plan Note (Addendum)
   Initially restarted apixiban since paracentesis completed.  Have discontinued now that she is comfort care

## 2022-03-09 NOTE — Assessment & Plan Note (Addendum)
   IV hydration with sodium bicarb   Continuing on OV fluids w/ NS  AG WNL on labs morning 05/16  We will stop checking labs for comfort care

## 2022-03-09 NOTE — ED Notes (Signed)
Lab called to obtain blood samples ?

## 2022-03-09 NOTE — Assessment & Plan Note (Addendum)
   Resolved initially.  No further lab work for comfort care

## 2022-03-09 NOTE — ED Notes (Signed)
Will redraw lactic when LR is completed and Bicarb drip is started. Will draw BMP at the same time ?

## 2022-03-09 NOTE — Assessment & Plan Note (Addendum)
   Sliding scale insulin coverage now stopped

## 2022-03-09 NOTE — Assessment & Plan Note (Addendum)
   IV Rocephin   UCX multiple species.  Repeat urinalysis unremarkable.  We have ruled out a recurrent UTI as cause of her nausea/vomiting

## 2022-03-09 NOTE — Assessment & Plan Note (Addendum)
Likely reactive 

## 2022-03-09 NOTE — Procedures (Signed)
PROCEDURE SUMMARY: ? ?Successful US guided paracentesis from RLQ.  ?Yielded 5.4 L of amber colored fluid.  ?No immediate complications.  ?Pt tolerated well.  ? ?Specimen was not sent for labs. ? ?EBL < 76m ? ?MTsosie BillingD PA-C ?03/09/2022 ?12:12 PM ? ? ? ?Of note, request was received for peritoneal PleurX catheter placement and this was discussed with my attending, Dr. EDenna Haggardtoday. The patient is currently being treated for an active urinary tract infection and plan will be to schedule the patient in a week for her peritoneal pleurX catheter to be placed once she has finished her antibiotic therapy, the plan was discussed with the primary team and the patient today.  ? ?MTsosie BillingD, PA-C ?03/09/2022, 12:14 PM ? ?

## 2022-03-09 NOTE — Assessment & Plan Note (Addendum)
   Creatinine 2.7 on admission, above baseline of 1.06, prerenal secondary to intractable vomiting.  Improved slightly today down to 1.46.  Trying gentle hydration with seems to be working, however this is at the cost of more ascites as patient's belly has become more distended each day.

## 2022-03-09 NOTE — Assessment & Plan Note (Addendum)
Secondary to worsening cancer.  Abdominal x-ray unrevealing.  Keep some liquids down at times.  Treating with IV Zosyn and Compazine for refractory.  Symptoms persistent.  Now that she is comfort care with no IV, sublingual dissolvable Zofran.

## 2022-03-09 NOTE — Assessment & Plan Note (Addendum)
   CT abdomen and pelvis showing large volume malignant abdominopelvic ascites with omental caking and peritoneal implants, grossly unchanged  IR paracentesis 05/15 removed 5+ L

## 2022-03-09 NOTE — ED Notes (Signed)
Pt provided with ice chips and gingerale per request. ?

## 2022-03-09 NOTE — ED Provider Notes (Signed)
? ?[facility] ?Provider Note ? ? ? Event Date/Time  ? First MD Initiated Contact with Patient 03/09/22 0013   ?  (approximate) ? ? ?History  ? ?Abdominal Pain ? ? ?HPI ? ?Sabrina [name] is a 76 y.o. female with history of metastatic endometrial cancer for which she is not undergoing treatment and recently started on hospice as per her daughter who is at bedside.  The patient presents tonight for evaluation of persistent vomiting and inability to tolerate her medications or any fluids.  This has been going on for several days and is only gotten worse.  She has vomited enough that they have started to see blood in her emesis. ? ?The patient states that she is currently not nauseated but if she "gets upset" in her stomach becomes nauseated.  She is denying abdominal pain.  She has ascites and has had a paracentesis in the past but she currently has no abdominal pain.  No fever of which they are aware.  She denies chest pain or shortness of breath.  She feels generalized weakness. ?  ?Droperidol 1.25 mg IV ordered for persistent nausea and vomiting. ? ?Physical Exam  ? ?Triage Vital Signs: ?ED Triage Vitals  ?Enc Vitals Group  ?   BP 03/08/22 2205 113/62  ?   Pulse Rate 03/08/22 2205 97  ?   Resp 03/08/22 2205 20  ?   Temp 03/08/22 2207 97.8 ?F (36.6 ?C)  ?   Temp Source 03/08/22 2207 Oral  ?   SpO2 03/08/22 2205 100 %  ?   Weight --   ?   Height --   ?   Head Circumference --   ?   Peak Flow --   ?   Pain Score --   ?   Pain Loc --   ?   Pain Edu? --   ?   Excl. in [place]? --   ? ? ?Most recent vital signs: ?Vitals:  ? 03/09/22 0630 03/09/22 0930  ?BP: (!) 99/50 106/70  ?Pulse: 87 88  ?Resp: 12 12  ?Temp:    ?SpO2: 100% 100%  ? ? ? ?General: Awake, no distress.  Chronic ill appearance. ?CV:  Good peripheral perfusion.  Normal heart sounds. ?Resp:  Normal effort.  Lungs are clear to auscultation. ?Abd:  Ascites with some distention but no tenderness to palpation throughout the abdomen.  Patient wants  to have an emesis bag with her but is not actively vomiting at this time. ?Other:  Somewhat tangential speech, a little bit difficult to redirect.  Daughter says this is baseline and gets worse if she does not take her Zoloft in the evening. ? ? ?ED Results / Procedures / Treatments  ? ?Labs ?(all labs ordered are listed, but only abnormal results are displayed) ?Labs Reviewed  ?LIPASE, BLOOD - Abnormal; Notable for the following components:  ?    Result Value  ? Lipase 142 (*)   ? All other components within normal limits  ?COMPREHENSIVE METABOLIC PANEL - Abnormal; Notable for the following components:  ? Potassium 6.5 (*)   ? CO2 18 (*)   ? Glucose, Bld 147 (*)   ? BUN 68 (*)   ? Creatinine, Ser 2.73 (*)   ? Albumin 2.9 (*)   ? GFR, Estimated 18 (*)   ? Anion gap 16 (*)   ? All other components within normal limits  ?CBC - Abnormal; Notable for the following components:  ? RBC 3.83 (*)   ?  Hemoglobin 10.6 (*)   ? HCT 35.2 (*)   ? RDW 17.5 (*)   ? Platelets 658 (*)   ? All other components within normal limits  ?URINALYSIS, ROUTINE W REFLEX MICROSCOPIC - Abnormal; Notable for the following components:  ? Color, Urine YELLOW (*)   ? APPearance CLOUDY (*)   ? Glucose, UA 50 (*)   ? Hgb urine dipstick LARGE (*)   ? Ketones, ur 5 (*)   ? Protein, ur 30 (*)   ? Leukocytes,Ua LARGE (*)   ? WBC, UA >50 (*)   ? Bacteria, UA RARE (*)   ? All other components within normal limits  ?COMPREHENSIVE METABOLIC PANEL - Abnormal; Notable for the following components:  ? Potassium 6.1 (*)   ? CO2 14 (*)   ? Glucose, Bld 152 (*)   ? BUN 67 (*)   ? Creatinine, Ser 2.64 (*)   ? Albumin 3.1 (*)   ? GFR, Estimated 18 (*)   ? Anion gap 19 (*)   ? All other components within normal limits  ?MAGNESIUM - Abnormal; Notable for the following components:  ? Magnesium 2.8 (*)   ? All other components within normal limits  ?LACTIC ACID, PLASMA - Abnormal; Notable for the following components:  ? Lactic Acid, Venous 2.9 (*)   ? All other  components within normal limits  ?LACTIC ACID, PLASMA - Abnormal; Notable for the following components:  ? Lactic Acid, Venous 2.7 (*)   ? All other components within normal limits  ?BASIC METABOLIC PANEL - Abnormal; Notable for the following components:  ? Potassium 5.6 (*)   ? CO2 15 (*)   ? Glucose, Bld 146 (*)   ? BUN 66 (*)   ? Creatinine, Ser 2.32 (*)   ? GFR, Estimated 21 (*)   ? Anion gap 17 (*)   ? All other components within normal limits  ?CBG MONITORING, ED - Abnormal; Notable for the following components:  ? Glucose-Capillary 139 (*)   ? All other components within normal limits  ?CBG MONITORING, ED - Abnormal; Notable for the following components:  ? Glucose-Capillary 131 (*)   ? All other components within normal limits  ?URINE CULTURE  ? ? ? ?EKG ? ?ED ECG REPORT ?IHinda Kehr, the attending physician, personally viewed and interpreted this ECG. ? ?Date: 03/08/2022 ?EKG Time: 23: 18 ?Rate: 95 ?Rhythm: normal sinus rhythm ?QRS Axis: normal ?Intervals: normal ?ST/T Wave abnormalities: Non-specific ST segment / T-wave changes, but no clear evidence of acute ischemia. ?Narrative Interpretation: no definitive evidence of acute ischemia; does not meet STEMI criteria. ? ? ? ?RADIOLOGY ?I personally viewed and interpreted the patient's CT of the abdomen and pelvis.  She has no evidence of SBO and no pneumoperitoneum.  The radiologist commented on the metastatic disease and ascites. ? ? ? ?PROCEDURES: ? ?Critical Care performed: Yes, see critical care procedure note(s) ? ?.1-3 Lead EKG Interpretation ?Performed by: Hinda Kehr, MD ?Authorized by: Hinda Kehr, MD  ? ?  Interpretation: normal   ?  ECG rate:  90 ?  ECG rate assessment: normal   ?  Rhythm: sinus rhythm   ?  Ectopy: none   ?  Conduction: normal   ?.Critical Care ?Performed by: Hinda Kehr, MD ?Authorized by: Hinda Kehr, MD  ? ?Critical care provider statement:  ?  Critical care time (minutes):  30 ?  Critical care time was exclusive  of:  Separately billable procedures and treating other patients ?  Critical care was necessary to treat or prevent imminent or life-threatening deterioration of the following conditions:  Metabolic crisis and renal failure ?  Critical care was time spent personally by me on the following activities:  Development of treatment plan with patient or surrogate, evaluation of patient's response to treatment, examination of patient, obtaining history from patient or surrogate, ordering and performing treatments and interventions, ordering and review of laboratory studies, ordering and review of radiographic studies, pulse oximetry, re-evaluation of patient's condition and review of old charts ? ? ?MEDICATIONS ORDERED IN ED: ?Medications  ?OLANZapine zydis (ZYPREXA) disintegrating tablet 2.5 mg (has no administration in time range)  ?pantoprazole (PROTONIX) EC tablet 40 mg (has no administration in time range)  ?propranolol ER (INDERAL LA) 24 hr capsule 60 mg (has no administration in time range)  ?insulin aspart (novoLOG) injection 0-9 Units (1 Units Subcutaneous Given 03/09/22 0726)  ?insulin aspart (novoLOG) injection 0-5 Units (has no administration in time range)  ?cefTRIAXone (ROCEPHIN) 2 g in sodium chloride 0.9 % 100 mL IVPB (has no administration in time range)  ?sodium bicarbonate 150 mEq in sterile water 1,150 mL infusion ( Intravenous New Bag/Given 03/09/22 0546)  ?cefTRIAXone (ROCEPHIN) 1 g in sodium chloride 0.9 % 100 mL IVPB (0 g Intravenous Stopped 03/09/22 0241)  ?sodium chloride 0.9 % bolus 1,000 mL (0 mLs Intravenous Stopped 03/09/22 0412)  ?droperidol (INAPSINE) 2.5 MG/ML injection 1.25 mg (1.25 mg Intravenous Given 03/09/22 0421)  ?lactated ringers bolus 1,000 mL (0 mLs Intravenous Stopped 03/09/22 0540)  ?sodium zirconium cyclosilicate (LOKELMA) packet 10 g (10 g Oral Given 03/09/22 0409)  ? ? ? ?IMPRESSION / MDM / ASSESSMENT AND PLAN / ED COURSE  ?I reviewed the triage vital signs and the nursing notes. ?              ?               ? ?Differential diagnosis includes, but is not limited to, SBO/ileus, acute on chronic symptoms as a result of her endometrial cancer, acute infection, mesenteric ischemia. ? ?Patient's

## 2022-03-10 DIAGNOSIS — R1110 Vomiting, unspecified: Secondary | ICD-10-CM | POA: Diagnosis not present

## 2022-03-10 LAB — CBC
HCT: 28.9 % — ABNORMAL LOW (ref 36.0–46.0)
Hemoglobin: 8.9 g/dL — ABNORMAL LOW (ref 12.0–15.0)
MCH: 27.6 pg (ref 26.0–34.0)
MCHC: 30.8 g/dL (ref 30.0–36.0)
MCV: 89.5 fL (ref 80.0–100.0)
Platelets: 515 10*3/uL — ABNORMAL HIGH (ref 150–400)
RBC: 3.23 MIL/uL — ABNORMAL LOW (ref 3.87–5.11)
RDW: 17.1 % — ABNORMAL HIGH (ref 11.5–15.5)
WBC: 6.5 10*3/uL (ref 4.0–10.5)
nRBC: 0 % (ref 0.0–0.2)

## 2022-03-10 LAB — BASIC METABOLIC PANEL
Anion gap: 9 (ref 5–15)
BUN: 51 mg/dL — ABNORMAL HIGH (ref 8–23)
CO2: 25 mmol/L (ref 22–32)
Calcium: 8.1 mg/dL — ABNORMAL LOW (ref 8.9–10.3)
Chloride: 109 mmol/L (ref 98–111)
Creatinine, Ser: 1.59 mg/dL — ABNORMAL HIGH (ref 0.44–1.00)
GFR, Estimated: 34 mL/min — ABNORMAL LOW (ref >60)
Glucose, Bld: 98 mg/dL (ref 70–99)
Potassium: 4.2 mmol/L (ref 3.5–5.1)
Sodium: 143 mmol/L (ref 135–145)

## 2022-03-10 LAB — GLUCOSE, CAPILLARY
Glucose-Capillary: 100 mg/dL — ABNORMAL HIGH (ref 70–99)
Glucose-Capillary: 100 mg/dL — ABNORMAL HIGH (ref 70–99)
Glucose-Capillary: 105 mg/dL — ABNORMAL HIGH (ref 70–99)
Glucose-Capillary: 95 mg/dL (ref 70–99)

## 2022-03-10 LAB — URINE CULTURE

## 2022-03-10 LAB — LACTIC ACID, PLASMA: Lactic Acid, Venous: 1.6 mmol/L (ref 0.5–1.9)

## 2022-03-10 MED ORDER — SODIUM CHLORIDE 0.9 % IV BOLUS
500.0000 mL | Freq: Once | INTRAVENOUS | Status: AC
  Administered 2022-03-10: 500 mL via INTRAVENOUS

## 2022-03-10 MED ORDER — APIXABAN 5 MG PO TABS
5.0000 mg | ORAL_TABLET | Freq: Two times a day (BID) | ORAL | Status: DC
  Administered 2022-03-10 – 2022-03-11 (×4): 5 mg via ORAL
  Filled 2022-03-10 (×5): qty 1

## 2022-03-10 NOTE — Assessment & Plan Note (Addendum)
   Patient is dying at this point.  With her mental health issues and dementia, she does not necessarily have a good understanding of this.  Her daughter who is the main default power of attorney along with her siblings are opting for DNR and comfort care, although they wanted the patient to be amenable to these decisions.  Patient although suspicious did state that she did not want anything else done and she did not want to be resuscitated.  Patient changed to DNR and we have made her comfort care.  Transfer to hospice facility 5/21

## 2022-03-10 NOTE — Progress Notes (Signed)
? ?      CROSS COVER NOTE ? ?NAME: [name] ?MRN: [id number] ?DOB : 10/19/1946 ? ? ?Secure chat from nursing reporting "BP 87/44 MAP 57. She runs in the 90s and low 100s usually but not this low" "82/42 manual" ? ?Sabrina [name] is a 76 yo F who presented to [facility] ED with a 3 day history of vomiting and abdominal pain. PMH DM, Stage IV adenocarcinoma of the endometrium, bipolar affective disorder, and Saddle Embolism on Eliquis. ? ?On bedside evaluation patient is alert and oriented, denies lightheadedness or dizziness, endorses recent nausea/vomiting. Extremities are warm to the touch with 2+ radial and pedal pulses bilaterally. Manual BP 92/56 (L) arm. ? ?Plan: ?500 mL NS bolus  ? ?Neomia Glass DNP, MHA, FNP-BC ?Nurse Practitioner ?[facility] ?[place] ?Pager [phone] ? ? ?

## 2022-03-10 NOTE — TOC Initial Note (Signed)
Transition of Care (TOC) - Initial/Assessment Note  ? ? ?Patient Details  ?Name: Sabrina [name] ?MRN: [id number] ?Date of Birth: 05-31-1946 ? ?Transition of Care (TOC) CM/SW Contact:    ?Candie Chroman, LCSW ?Phone Number: ?03/10/2022, 9:28 AM ? ?Clinical Narrative:   Readmission prevention screen complete. CSW met with patient. No supports at bedside. CSW introduced role and explained that discharge planning would be discussed. PCP was Fulton Reek, MD prior to starting hospice services. Her daughter takes her to appointments. [facility] [place]. No issues obtaining medications. Patient has hospital bed, cane, walker, and shower chair at home. She was previously active with [facility]. Called representative who said they are unable to accept her back at this time. Patient was supposed to get a drain so they could manage her fluid at home but then she wasn't eligible for the drain at the time. Medicare considers paracentesis aggressive care and will not pay for both the procedure and hospice. They would not be able to accept her back until patient/daughter decide they do not want to return to the hospital for aggressive care. Attending is aware. No further concerns. CSW encouraged patient to contact CSW as needed. CSW will continue to follow patient for support and facilitate discharge once medically stable.              ? ?Expected Discharge Plan:  (TBD) ?Barriers to Discharge: Continued Medical Work up ? ? ?Patient Goals and CMS Choice ?  ?  ?  ? ?Expected Discharge Plan and Services ?Expected Discharge Plan:  (TBD) ?  ?  ?Post Acute Care Choice:  (TBD) ?Living arrangements for the past 2 months: [place] ?                ?  ?  ?  ?  ?  ?  ?  ?  ?  ?  ? ?Prior Living Arrangements/Services ?Living arrangements for the past 2 months: [place] ?Lives with:: Adult Children ?Patient language and need for interpreter reviewed:: Yes ?Do you feel safe going back to the place where you  live?: Yes      ?Need for Family Participation in Patient Care: Yes (Comment) ?Care giver support system in place?: Yes (comment) ?Current home services: DME ?Criminal Activity/Legal Involvement Pertinent to Current Situation/Hospitalization: No - Comment as needed ? ?Activities of Daily Living ?  ?  ? ?Permission Sought/Granted ?Permission sought to share information with : Facility Sport and exercise psychologist, Family Supports ?Permission granted to share information with : Yes, Verbal Permission Granted ? Share Information with NAME: [name] ? Permission granted to share info w AGENCY: [place] ? Permission granted to share info w Relationship: Daughter ? Permission granted to share info w Contact Information: [phone] ? ?Emotional Assessment ?Appearance:: Appears stated age ?Attitude/Demeanor/Rapport: Engaged, Gracious ?Affect (typically observed): Accepting, Appropriate, Calm, Pleasant ?Orientation: : Oriented to Self, Oriented to Place, Oriented to  Time, Oriented to Situation ?Alcohol / Substance Use: Not Applicable ?Psych Involvement: No (comment) ? ?Admission diagnosis:  Dehydration [E86.0] ?Hyperkalemia [E87.5] ?Endometrial cancer ([place]) [C54.1] ?Intractable vomiting [R11.10] ?Intractable nausea and vomiting [R11.2] ?Acute renal failure superimposed on chronic kidney disease, unspecified CKD stage, unspecified acute renal failure type ([place]) [N17.9, N18.9] ?Patient Active Problem List  ? Diagnosis Date Noted  ? Intractable vomiting 03/09/2022  ? Hyperkalemia 03/09/2022  ? Urinary tract infection 03/09/2022  ? Elevated lipase 03/09/2022  ? Thrombocytosis 03/09/2022  ? Lactic acidosis 03/09/2022  ? Chronic anticoagulation secondary  to history of saddle embolus 03/09/2022  ? Alcoholic cirrhosis of liver with ascites ([place]) 03/09/2022  ? Goals of care, counseling/discussion 02/11/2022  ? Acute kidney injury superimposed on chronic kidney disease llla(HCC) 02/07/2022  ? Physical deconditioning  02/05/2022  ? AKI (acute kidney injury) ([place]) 01/30/2022  ? High anion gap metabolic acidosis 33/91/7921  ? Ascites, malignant 01/29/2022  ? Serous carcinoma of body of uterus ([place]) 01/29/2022  ? Endometrial cancer ([place]) 01/29/2022  ? Microcytic anemia 01/28/2022  ? Postmenopausal bleeding 01/27/2022  ? Hyponatremia 01/10/2022  ? GERD (gastroesophageal reflux disease) 01/08/2022  ? Elevated troponin 01/05/2022  ? Diabetes mellitus without complication ([place]) 78/37/5423  ? Acute saddle pulmonary embolism ([place]) 01/05/2022  ? Abnormal finding present on diagnostic imaging of uterus   ? Generalized weakness   ? History of stroke 03/02/2020  ? Leg weakness, bilateral 03/02/2020  ? Slurred speech 03/02/2020  ? Transient confusion 03/02/2020  ? Acute CVA (cerebrovascular accident) ([place]) 01/31/2020  ? Hyperglycemia due to type 2 diabetes mellitus ([place]) 01/30/2020  ? Hepatic steatosis 06/27/2019  ? History of behavioral and mental health problems 06/27/2019  ? Abnormal finding on CT scan 06/27/2019  ? Abdominal bloating 05/16/2019  ? Epigastric pain 05/16/2019  ? Personal history of malignant carcinoid tumor of rectum 05/16/2019  ? Mild nonproliferative diabetic retinopathy of left eye with macular edema associated with type 2 diabetes mellitus ([place]) 05/15/2019  ? Incomplete uterine prolapse 12/08/2017  ? Cystocele, midline 12/08/2017  ? LVH (left ventricular hypertrophy) due to hypertensive disease, without heart failure 06/01/2016  ? Bipolar affective disorder, manic, severe, with psychotic behavior ([place])   ? Type 2 diabetes mellitus with hyperlipidemia ([place]) 04/01/2016  ? Hyperlipidemia 04/01/2016  ? Noncompliance 03/28/2016  ? B12 deficiency 12/18/2014  ? ?PCP:  System, Provider Not In ?Pharmacy:   ?[id number] [place] [address]?Phone: [phone]: [phone]? ? ? ? ?Social Determinants of Health (SDOH) Interventions ?  ? ?Readmission Risk  Interventions ? ?  03/10/2022  ?  9:23 AM  ?Readmission Risk Prevention Plan  ?Transportation Screening Complete  ?Medication Review (Press photographer Complete  ?PCP or Specialist appointment within 3-5 days of discharge Complete  ?SW Recovery Care/Counseling Consult Complete  ?[place] Applicable  ? ? ? ?

## 2022-03-10 NOTE — Consult Note (Signed)
[facility] CM Inpatient Consult ? ? ?03/10/2022 ? ?Sabrina [name] ?02/09/1946 ?[id number] ? ?[name])  ?  ?Patient was reviewed for less than 30 days readmission and noted extreme high risk score for unplanned readmission. Assessed for post hospital chronic care coordination needs. Patient had been active with [facility] CM in the past. ? ?Per review, no primary provider listed as patient had been active with hospice services.  ? ?Plan: Will continue to follow for progression. ? ?Of note, [facility] Care Management services does not replace or interfere with any services that are arranged by inpatient case management or social work.  ? ?Netta Cedars, MSN, RN ?[facility] Liaison ?Toll free office [phone]  ?

## 2022-03-10 NOTE — Progress Notes (Signed)
?PROGRESS NOTE ? ? ? ?Sabrina [name]  SWH:[id number] DOB: 10/30/1945  ?DOA: 03/09/2022 ?Date of Service: 03/10/22 ?PCP: System, Provider Not In ? ? ? ? ?Brief Narrative / Hospital Course:  ?76 year old female, PMH stage IV adenocarcinoma of endometrium, history of saddle embolism on chronic anticoagulation with Eliquis, diabetes type 2, bipolar disorder. ? ?Consultants:  ?none ? ?Procedures: ?Paracentesis 03/09/2022 ? ? ? ?Subjective: ?Patient reports feeling tired, still nausea, not wanting to eat much.  ? ? ? ? ?ASSESSMENT & PLAN: ?  ?Principal Problem: ?  Intractable vomiting ?Active Problems: ?  Urinary tract infection ?  Ascites, malignant ?  Acute kidney injury superimposed on chronic kidney disease llla(HCC) ?  Type 2 diabetes mellitus with hyperlipidemia ([place]) ?  High anion gap metabolic acidosis ?  Hyperkalemia ?  Lactic acidosis ?  Elevated lipase ?  Thrombocytosis ?  Diabetes mellitus without complication ([place]) ?  History of behavioral and mental health problems ?  Endometrial cancer ([place]) ?  Goals of care, counseling/discussion ?  Chronic anticoagulation secondary to history of saddle embolus ? ? ?Intractable vomiting ?Diet to advance as tolerated ?Keeping down some liquids today but requesting continue IV hydration, not feeling like eating/drinking much ?IV fluids NS 75 mL/h given frail habitus  ?Monitor I&O ? ?Urinary tract infection ?IV Rocephin  ?UCX multiple species  ? ?Acute kidney injury superimposed on chronic kidney disease llla(HCC) ?Creatinine 2.7 on admission, above baseline of 1.06, prerenal secondary to intractable vomiting ?Cr down to 1.59 ?Hydrate and monitor, avoiding nephrotoxic drugs and renally dosing all meds ? ?Type 2 diabetes mellitus with hyperlipidemia ([place]) ?Sliding scale insulin coverage ? ?High anion gap metabolic acidosis ?IV hydration with sodium bicarb  ?Continuing on OV fluids w/ NS ?AG WNL on labs morning 05/16 ? ?Hyperkalemia ?Resolved ?Continuous cardiac monitoring can  d/c if no events overnight  ? ?Lactic acidosis ?Resolved  ?POA, possibly related to sepsis but more likely intractable nausea and vomiting ?IV hydration and monitor ? ?Elevated lipase ?Suspect reactive but continue to trend ?Pancreas within normal limits on CT ? ?Thrombocytosis ?Likely reactive ?Plt trending down  ? ?Diabetes mellitus without complication ([place]) ?Sliding scale insulin coverage ? ?Endometrial cancer ([place]) ?CT abdomen and pelvis showing large volume malignant abdominopelvic ascites with omental caking and peritoneal implants, grossly unchanged ?On hospice - TOC following ?I disagree with the reported assessment from hospice that this hospitalization disqualifies her under the argument that she has received "aggressive care" -medical determination, her nausea/vomiting and subsequent metabolic derangements are symptoms of her malignant ascites and our treatment here has been palliative rather than aggressive to treat underlying cancer ?Spoke with IR, who recommended that can plan for palliative drain placement once she has completed treatment for UTI, they can schedule her outpatient ? ?Chronic anticoagulation secondary to history of saddle embolus ?Restarted apixiban since paracentesis completed  ? ?History of behavioral and mental health problems ?Continue home meds pending med rec ? ?Ascites, malignant ?CT abdomen and pelvis showing large volume malignant abdominopelvic ascites with omental caking and peritoneal implants, grossly unchanged ?IR paracentesis 05/15 removed 5+ L ? ? ?Goals of care, counseling/discussion ?Very poor prognosis, per my conversation with the daughter [name] and my conversation with the patient, she does not seem to have a realistic understanding of the severity of her illness.  [name] has concerns that patient cannot walk, cannot take care of herself at home, [name]'s abilities to help are limited.   ?Patient is full code, which daughter states was a decision that  was made  with hospice recently but she was a bit surprised by this since the patient has voiced in the past after watching her sister go through being on and off of life support that she would not want such extraordinary measures taken for herself, daughter is questioning how much patient's dementia might be playing a role in her thinking.  I have stressed to the patient's daughter that I believe CPR/Intubation would be futile interventions that would prolong suffering ?[name] states that patient has been evaluated by the oncology palliative care NP and does not want to see that person again, I will place a consult for palliative and hopefully a different provider might make a better connection with her ? ? ? ?DVT prophylaxis: eliquis ?Code Status: FULL ?Family Communication: daughter on the phone today - questions about prognosis, she doesn't think she can continue to care for her mom at home ?Disposition Plan / TOC needs: hospice - may need to consider hospice facility if family unable to take her back.  ?Barriers to discharge / significant pending items: inability to tolerate po, hopefully can be d/c ready next 1-2 days, need to confirm hospice at home  ? ? ? ? ? ? ? ? ? ? ? ? ?Objective: ?Vitals:  ? 03/10/22 0230 03/10/22 0529 03/10/22 0840 03/10/22 1151  ?BP: (!) 96/50 130/71 (!) 94/50 (!) 92/52  ?Pulse: 75 86 72 88  ?Resp:  '18 16 17  '$ ?Temp:   97.7 ?F (36.5 ?C) 97.6 ?F (36.4 ?C)  ?TempSrc:   Oral Oral  ?SpO2:  97% 99% 100%  ?Weight:      ? ? ?Intake/Output Summary (Last 24 hours) at 03/10/2022 1530 ?Last data filed at 03/10/2022 1122 ?Gross per 24 hour  ?Intake 1225.34 ml  ?Output --  ?Net 1225.34 ml  ? ?Filed Weights  ? 03/09/22 1031  ?Weight: 61.8 kg  ? ? ?Examination:  ?Constitutional:  ?VS as above ?General Appearance: alert, frail/thin, NAD ?Neck: ?No masses, trachea midline ?Respiratory: ?Normal respiratory effort ?Breath sounds normal, no wheeze/rhonchi/rales ?Cardiovascular: ?S1/S2 normal RRR ?No lower extremity  edema ?Gastrointestinal: ?Nontender ?Musculoskeletal:  ?No clubbing/cyanosis of digits ?Psychiatric: ?Normal judgment/insight ?Depressed mood and affect ? ? ? ? ? ? ?Scheduled Medications:  ? apixaban  5 mg Oral BID  ? insulin aspart  0-5 Units Subcutaneous QHS  ? insulin aspart  0-9 Units Subcutaneous TID WC  ? OLANZapine zydis  2.5 mg Oral BID  ? pantoprazole  40 mg Oral Daily  ? ? ?Continuous Infusions: ? sodium chloride 75 mL/hr at 03/10/22 1349  ? cefTRIAXone (ROCEPHIN)  IV Stopped (03/09/22 2242)  ? ? ?PRN Medications:  ?ondansetron (ZOFRAN) IV ? ?Antimicrobials:  ?Anti-infectives (From admission, onward)  ? ? Start     Dose/Rate Route Frequency Ordered Stop  ? 03/09/22 2200  cefTRIAXone (ROCEPHIN) 2 g in sodium chloride 0.9 % 100 mL IVPB       ? 2 g ?200 mL/hr over 30 Minutes Intravenous Every 24 hours 03/09/22 0349 03/15/22 2159  ? 03/09/22 0045  cefTRIAXone (ROCEPHIN) 1 g in sodium chloride 0.9 % 100 mL IVPB       ? 1 g ?200 mL/hr over 30 Minutes Intravenous STAT 03/09/22 0039 03/09/22 0241  ? ?  ? ? ?Data Reviewed: I have personally reviewed following labs and imaging studies ? ?CBC: ?Recent Labs  ?Lab 03/08/22 ?2210 03/10/22 ?0433  ?WBC 6.9 6.5  ?HGB 10.6* 8.9*  ?HCT 35.2* 28.9*  ?MCV 91.9 89.5  ?PLT 658* 515*  ? ?  Basic Metabolic Panel: ?Recent Labs  ?Lab 03/08/22 ?2210 03/09/22 ?2542 03/09/22 ?7062 03/09/22 ?1507 03/10/22 ?0433  ?NA 141 142 142 143 143  ?K 6.5* 6.1* 5.6* 5.1 4.2  ?CL 107 109 110 105 109  ?CO2 18* 14* 15* 20* 25  ?GLUCOSE 147* 152* 146* 116* 98  ?BUN 68* 67* 66* 56* 51*  ?CREATININE 2.73* 2.64* 2.32* 1.95* 1.59*  ?CALCIUM 9.6 9.9 9.1 8.7* 8.1*  ?MG  --  2.8*  --   --   --   ? ?GFR: ?Estimated Creatinine Clearance: 26.4 mL/min (A) (by C-G formula based on SCr of 1.59 mg/dL (H)). ?Liver Function Tests: ?Recent Labs  ?Lab 03/08/22 ?2210 03/09/22 ?3762  ?AST 21 16  ?ALT 8 7  ?ALKPHOS 57 63  ?BILITOT 1.1 0.9  ?PROT 7.0 7.3  ?ALBUMIN 2.9* 3.1*  ? ?Recent Labs  ?Lab 03/08/22 ?2210  ?LIPASE 142*   ? ?No results for input(s): AMMONIA in the last 168 hours. ?Coagulation Profile: ?No results for input(s): INR, PROTIME in the last 168 hours. ?Cardiac Enzymes: ?No results for input(s): CKTOTAL, CKMB,

## 2022-03-11 ENCOUNTER — Ambulatory Visit: Payer: Medicare HMO

## 2022-03-11 ENCOUNTER — Inpatient Hospital Stay: Payer: Medicare Other

## 2022-03-11 DIAGNOSIS — R112 Nausea with vomiting, unspecified: Secondary | ICD-10-CM | POA: Diagnosis not present

## 2022-03-11 DIAGNOSIS — N179 Acute kidney failure, unspecified: Secondary | ICD-10-CM

## 2022-03-11 DIAGNOSIS — N189 Chronic kidney disease, unspecified: Secondary | ICD-10-CM

## 2022-03-11 DIAGNOSIS — R1110 Vomiting, unspecified: Secondary | ICD-10-CM | POA: Diagnosis not present

## 2022-03-11 DIAGNOSIS — R180 Malignant ascites: Secondary | ICD-10-CM

## 2022-03-11 LAB — CBC
HCT: 29.9 % — ABNORMAL LOW (ref 36.0–46.0)
Hemoglobin: 9 g/dL — ABNORMAL LOW (ref 12.0–15.0)
MCH: 27.3 pg (ref 26.0–34.0)
MCHC: 30.1 g/dL (ref 30.0–36.0)
MCV: 90.6 fL (ref 80.0–100.0)
Platelets: 489 10*3/uL — ABNORMAL HIGH (ref 150–400)
RBC: 3.3 MIL/uL — ABNORMAL LOW (ref 3.87–5.11)
RDW: 17.5 % — ABNORMAL HIGH (ref 11.5–15.5)
WBC: 5.9 10*3/uL (ref 4.0–10.5)
nRBC: 0 % (ref 0.0–0.2)

## 2022-03-11 LAB — GLUCOSE, CAPILLARY
Glucose-Capillary: 85 mg/dL (ref 70–99)
Glucose-Capillary: 99 mg/dL (ref 70–99)
Glucose-Capillary: 85 mg/dL (ref 70–99)
Glucose-Capillary: 80 mg/dL (ref 70–99)

## 2022-03-11 LAB — BASIC METABOLIC PANEL
Anion gap: 9 (ref 5–15)
BUN: 39 mg/dL — ABNORMAL HIGH (ref 8–23)
CO2: 24 mmol/L (ref 22–32)
Calcium: 8 mg/dL — ABNORMAL LOW (ref 8.9–10.3)
Chloride: 111 mmol/L (ref 98–111)
Creatinine, Ser: 1.56 mg/dL — ABNORMAL HIGH (ref 0.44–1.00)
GFR, Estimated: 34 mL/min — ABNORMAL LOW (ref >60)
Glucose, Bld: 87 mg/dL (ref 70–99)
Potassium: 3.9 mmol/L (ref 3.5–5.1)
Sodium: 144 mmol/L (ref 135–145)

## 2022-03-11 MED ORDER — PROCHLORPERAZINE EDISYLATE 10 MG/2ML IJ SOLN
10.0000 mg | INTRAMUSCULAR | Status: DC | PRN
  Administered 2022-03-11 – 2022-03-13 (×8): 10 mg via INTRAVENOUS
  Filled 2022-03-11 (×8): qty 2

## 2022-03-11 MED ORDER — SODIUM CHLORIDE 0.9 % IV SOLN
2.0000 g | INTRAVENOUS | Status: AC
  Administered 2022-03-11: 2 g via INTRAVENOUS
  Filled 2022-03-11: qty 2

## 2022-03-11 NOTE — Progress Notes (Signed)
[facility] Progress Note ? ?Patient: Sabrina [name]    OVZ:[id number]  DOA: 03/09/2022    ?Date of Service: the patient was seen and examined on 03/11/2022 ? ?Brief hospital course: ?76 year old female with past medical history of diabetes mellitus, bipolar disorder, recent hospitalization last month for acute kidney injury and stage IV adenocarcinoma of the endometrium presented to the emergency room on 5/14 with 3 days of vomiting and abdominal pain.  Patient was originally scheduled for paracentesis on 5/12 secondary to ascites from her endometrial cancer, but missed that appointment.  Patient found to have urinary tract infection and acute kidney injury.  Admitted to the hospitalist service. ? ?Interventional radiology evaluated patient and performed an ultrasound-guided paracentesis removing 5.4 L of fluid.  Patient previously had been followed by outpatient hospice, but was discharged due to continued emergency room presentations and admissions for acute medical issues. ? ?Assessment and Plan: ?Assessment and Plan: ?* Intractable vomiting ?Suspect secondary to ascites.  Abdominal x-ray unrevealing.  Keep some liquids down at times.  Treating with IV Zosyn and Compazine for refractory. ? ?Urinary tract infection ?IV Rocephin  ?UCX multiple species  ? ?Acute kidney injury superimposed on chronic kidney disease llla(HCC) ?Creatinine 2.7 on admission, above baseline of 1.06, prerenal secondary to intractable vomiting.  Down to 1.56, minimal change from previous day although she continued to have some nausea and vomiting through the night. ?Hydrate and monitor, avoiding nephrotoxic drugs and renally dosing all meds ? ?Ascites, malignant ?CT abdomen and pelvis showing large volume malignant abdominopelvic ascites with omental caking and peritoneal implants, grossly unchanged ?IR paracentesis 05/15 removed 5+ L ? ? ?High anion gap metabolic acidosis ?IV hydration with sodium bicarb  ?Continuing on OV fluids w/  NS ?AG WNL on labs morning 05/16 ? ?Type 2 diabetes mellitus with hyperlipidemia ([place]) ?Sliding scale insulin coverage ? ?Hyperkalemia ?Resolved ?Continuous cardiac monitoring can d/c if no events overnight  ? ?Lactic acidosis ?Resolved, secondary to intractable nausea and vomiting.  Sepsis has been ruled out. ? ?Elevated lipase ?Suspect reactive but continue to trend ?Pancreas within normal limits on CT ? ?Thrombocytosis ?Likely reactive ?Plt trending down  ? ?Chronic anticoagulation secondary to history of saddle embolus ?Restarted apixiban since paracentesis completed  ? ?Goals of care, counseling/discussion ?Very poor prognosis, per my conversation with the daughter [name] and my conversation with the patient, she does not seem to have a realistic understanding of the severity of her illness.  [name] has concerns that patient cannot walk, cannot take care of herself at home, [name]'s abilities to help are limited.   ?Patient is full code, which daughter states was a decision that was made with hospice recently but she was a bit surprised by this since the patient has voiced in the past after watching her sister go through being on and off of life support that she would not want such extraordinary measures taken for herself, daughter is questioning how much patient's dementia might be playing a role in her thinking.  I have stressed to the patient's daughter that I believe CPR/Intubation would be futile interventions that would prolong suffering ?[name] states that patient has been evaluated by the oncology palliative care NP and does not want to see that person again, I will place a consult for palliative and hopefully a different provider might make a better connection with her ? ?Endometrial cancer ([place]) ?CT abdomen and pelvis showing large volume malignant abdominopelvic ascites with omental caking and peritoneal implants, grossly unchanged ?Palliative care to  see to reestablish goals of care ?Spoke with  IR, who recommended that can plan for palliative drain placement once she has completed treatment for UTI, they can schedule her outpatient ? ?History of behavioral and mental health problems ?Zyprexa restarted ? ?Diabetes mellitus without complication ([place]) ?Sliding scale insulin coverage ? ? ? ? ? ? ?Body mass index is 24.91 kg/m?.  ?  ?   ? ?Consultants: ?Palliative care ?Interventional radiology ? ?Procedures: ?Status post paracentesis done 5/15 ? ?Antimicrobials: ?IV Rocephin 5/15-present ? ?Code Status: Full code ? ? ?Subjective: Patient complains of nausea and vomiting ongoing ? ?Objective: ?Vital signs were reviewed and unremarkable. ?Vitals:  ? 03/11/22 1133 03/11/22 1637  ?BP: (!) 105/54 110/62  ?Pulse: 73 73  ?Resp: 18 19  ?Temp: 97.8 ?F (36.6 ?C) 98 ?F (36.7 ?C)  ?SpO2: 100% 97%  ? ? ?Intake/Output Summary (Last 24 hours) at 03/11/2022 1720 ?Last data filed at 03/10/2022 2046 ?Gross per 24 hour  ?Intake --  ?Output 50 ml  ?Net -50 ml  ? ?Filed Weights  ? 03/09/22 1031  ?Weight: 61.8 kg  ? ?Body mass index is 24.91 kg/m?. ? ?Exam: ? ?General: Fatigued, mild distress from nausea ?HEENT: Normocephalic atraumatic, mucous membranes slightly dry ?Cardiovascular: Regular rate and rhythm, S1-S2 ?Respiratory: Clear to auscultation bilaterally ?Abdomen: Soft, mild distention, nontender, hypoactive bowel sounds ?Musculoskeletal: No clubbing or cyanosis, trace pitting edema ?Skin: No skin breaks, tears or lesions ?Psychiatry: Appropriate, no evidence of psychoses ?Neurology: No focal deficits ? ?Data Reviewed: ?Hemoglobin at 9.0.  Platelets at 49.  Creatinine with minimal improvement from previous day at 1.56 ? ?Disposition:  ?Status is: Inpatient ?Remains inpatient appropriate because: Establishment of goals of care, treatment of symptoms ?  ? ?Anticipated discharge date: 5/19 ?Family Communication: Left message for daughter ?DVT Prophylaxis: ?SCDs Start: 03/09/22 0424 ?apixaban (ELIQUIS) tablet 5 mg   ? ? ?Author: ?Annita Brod ,MD ?03/11/2022 5:20 PM ? ?To reach On-call, see care teams to locate the attending and reach out via [email or web address]. ?Between 7PM-7AM, please contact night-coverage ?If you still have difficulty reaching the attending provider, please page the [facility] (Director on Call) for [facility] on amion for assistance. ? ?

## 2022-03-11 NOTE — Hospital Course (Addendum)
76 year old female with past medical history of diabetes mellitus, bipolar disorder, recent hospitalization last month for acute kidney injury and stage IV adenocarcinoma of the endometrium presented to the emergency room on 5/14 with 3 days of vomiting and abdominal pain.  Patient was originally scheduled for paracentesis on 5/12 secondary to ascites from her endometrial cancer, but missed that appointment.  Patient found to have urinary tract infection and acute kidney injury.  Admitted to the hospitalist service.  Interventional radiology evaluated patient and performed an ultrasound-guided paracentesis removing 5.4 L of fluid.  Patient previously had been followed by outpatient hospice, but was discharged due to continued emergency room presentations and admissions for acute medical issues.  Since hospitalization, patient continues to have episodes of intractable vomiting, not very well controlled despite IV medications.  After extensive discussion with patient and family, patient and family have opted for hospice and comfort care.  As per their wishes, patient made DNR.  Patient accepted at hospice facility on 5/21.

## 2022-03-12 DIAGNOSIS — N179 Acute kidney failure, unspecified: Secondary | ICD-10-CM | POA: Diagnosis not present

## 2022-03-12 DIAGNOSIS — R112 Nausea with vomiting, unspecified: Secondary | ICD-10-CM | POA: Diagnosis not present

## 2022-03-12 DIAGNOSIS — R1110 Vomiting, unspecified: Secondary | ICD-10-CM | POA: Diagnosis not present

## 2022-03-12 DIAGNOSIS — R180 Malignant ascites: Secondary | ICD-10-CM

## 2022-03-12 DIAGNOSIS — N189 Chronic kidney disease, unspecified: Secondary | ICD-10-CM

## 2022-03-12 LAB — GLUCOSE, CAPILLARY
Glucose-Capillary: 83 mg/dL (ref 70–99)
Glucose-Capillary: 77 mg/dL (ref 70–99)
Glucose-Capillary: 93 mg/dL (ref 70–99)
Glucose-Capillary: 91 mg/dL (ref 70–99)

## 2022-03-12 LAB — APTT: aPTT: >200 seconds (ref 24–36)

## 2022-03-12 MED ORDER — HEPARIN (PORCINE) 25000 UT/250ML-% IV SOLN
1000.0000 [IU]/h | INTRAVENOUS | Status: DC
  Administered 2022-03-12: 1000 [IU]/h via INTRAVENOUS
  Filled 2022-03-12: qty 250

## 2022-03-12 MED ORDER — OLANZAPINE 10 MG IM SOLR
2.5000 mg | Freq: Two times a day (BID) | INTRAMUSCULAR | Status: DC | PRN
  Filled 2022-03-12: qty 10

## 2022-03-12 MED ORDER — ONDANSETRON 4 MG PO TBDP
8.0000 mg | ORAL_TABLET | Freq: Two times a day (BID) | ORAL | Status: DC
  Administered 2022-03-12: 8 mg via ORAL
  Filled 2022-03-12 (×2): qty 2

## 2022-03-12 MED ORDER — HEPARIN (PORCINE) 25000 UT/250ML-% IV SOLN
800.0000 [IU]/h | INTRAVENOUS | Status: DC
  Administered 2022-03-13: 800 [IU]/h via INTRAVENOUS

## 2022-03-12 NOTE — Progress Notes (Signed)
[facility] Progress Note  Patient: Sabrina [name]    JIR:[id number]  DOA: 03/09/2022    Date of Service: the patient was seen and examined on 03/12/2022  Brief hospital course: 76 year old female with past medical history of diabetes mellitus, bipolar disorder, recent hospitalization last month for acute kidney injury and stage IV adenocarcinoma of the endometrium presented to the emergency room on 5/14 with 3 days of vomiting and abdominal pain.  Patient was originally scheduled for paracentesis on 5/12 secondary to ascites from her endometrial cancer, but missed that appointment.  Patient found to have urinary tract infection and acute kidney injury.  Admitted to the hospitalist service.  Interventional radiology evaluated patient and performed an ultrasound-guided paracentesis removing 5.4 L of fluid.  Patient previously had been followed by outpatient hospice, but was discharged due to continued emergency room presentations and admissions for acute medical issues.  Since hospitalization, patient continues to have episodes of intractable vomiting, controlled with IV medications.  Assessment and Plan: Assessment and Plan: * Intractable vomiting Suspect secondary to ascites.  Abdominal x-ray unrevealing.  Keep some liquids down at times.  Treating with IV Zosyn and Compazine for refractory.  Symptoms persisting.  We will try some scheduled sublingual Zosyn.  Urinary tract infection IV Rocephin  UCX multiple species.  We will repeat urinalysis given persistent nausea and vomiting to ensure not resistant to Rocephin.  Acute kidney injury superimposed on chronic kidney disease llla(HCC) Creatinine 2.7 on admission, above baseline of 1.06, prerenal secondary to intractable vomiting.  Down to 1.56, minimal change from previous day although she continued to have some nausea and vomiting through the night. Hydrate and monitor, avoiding nephrotoxic drugs and renally dosing all meds  Ascites,  malignant CT abdomen and pelvis showing large volume malignant abdominopelvic ascites with omental caking and peritoneal implants, grossly unchanged IR paracentesis 05/15 removed 5+ L   High anion gap metabolic acidosis IV hydration with sodium bicarb  Continuing on OV fluids w/ NS AG WNL on labs morning 05/16  Type 2 diabetes mellitus with hyperlipidemia (HCC) Sliding scale insulin coverage  Hyperkalemia Resolved Continuous cardiac monitoring can d/c if no events overnight   Lactic acidosis Resolved, secondary to intractable nausea and vomiting.  Sepsis has been ruled out.  Elevated lipase Suspect reactive but continue to trend Pancreas within normal limits on CT  Thrombocytosis Likely reactive Plt trending down   Chronic anticoagulation secondary to history of saddle embolus Restarted apixiban since paracentesis completed   Goals of care, counseling/discussion Very poor prognosis, per my conversation with the daughter [name] and my conversation with the patient, she does not seem to have a realistic understanding of the severity of her illness.  [name] has concerns that patient cannot walk, cannot take care of herself at home, [name]'s abilities to help are limited.   Patient is full code, which daughter states was a decision that was made with hospice recently but she was a bit surprised by this since the patient has voiced in the past after watching her sister go through being on and off of life support that she would not want such extraordinary measures taken for herself, daughter is questioning how much patient's dementia might be playing a role in her thinking.  I have stressed to the patient's daughter that I believe CPR/Intubation would be futile interventions that would prolong suffering [name] states that patient has been evaluated by the oncology palliative care NP and does not want to see that person again, I will place  a consult for palliative and hopefully a  different provider might make a better connection with her  Endometrial cancer ([place]) CT abdomen and pelvis showing large volume malignant abdominopelvic ascites with omental caking and peritoneal implants, grossly unchanged Palliative care to see to reestablish goals of care Spoke with IR, who recommended that can plan for palliative drain placement once she has completed treatment for UTI, they can schedule her outpatient  History of behavioral and mental health problems Zyprexa restarted.  Changed to IM due to refusal to take p.o. due to nausea and vomiting.  Diabetes mellitus without complication (HCC) Sliding scale insulin coverage       Body mass index is 24.92 kg/m.        Consultants: Palliative care Interventional radiology  Procedures: Status post paracentesis done 5/15  Antimicrobials: IV Rocephin 5/15-present  Code Status: Full code   Subjective: Patient complains of continued nausea and vomiting.  Does not want to take her oral medications because she gets sick afterwards  Objective: Vital signs were reviewed and unremarkable. Vitals:   03/12/22 1125 03/12/22 1645  BP: (!) 110/58 137/66  Pulse: 82 95  Resp: 18 19  Temp: 98 F (36.7 C) 97.7 F (36.5 C)  SpO2: 99% 100%    Intake/Output Summary (Last 24 hours) at 03/12/2022 1703 Last data filed at 03/11/2022 1806 Gross per 24 hour  Intake 2244.39 ml  Output --  Net 2244.39 ml    Filed Weights   03/09/22 1031 03/12/22 1300  Weight: 61.8 kg 61.8 kg   Body mass index is 24.92 kg/m.  Exam:  General: Fatigued, mild distress from nausea HEENT: Normocephalic atraumatic, mucous membranes slightly dry Cardiovascular: Regular rate and rhythm, S1-S2 Respiratory: Clear to auscultation bilaterally Abdomen: Soft, mild distention, nontender, hypoactive bowel sounds Musculoskeletal: No clubbing or cyanosis, trace pitting edema Skin: No skin breaks, tears or lesions Psychiatry: Appropriate, no evidence  of psychoses Neurology: No focal deficits  Data Reviewed: CBG stable  Disposition:  Status is: Inpatient Remains inpatient appropriate because: Establishment of goals of care, nausea and vomiting resolved    Anticipated discharge date: To be determined Family Communication: Sister at the bedside DVT Prophylaxis: SCDs Start: 03/09/22 0424    Author: Annita Brod ,MD 03/12/2022 5:03 PM  To reach On-call, see care teams to locate the attending and reach out via [email or web address]. Between 7PM-7AM, please contact night-coverage If you still have difficulty reaching the attending provider, please page the [facility] (Director on Call) for [facility] on amion for assistance.

## 2022-03-12 NOTE — Consult Note (Signed)
ANTICOAGULATION CONSULT NOTE - Initial Consult  Pharmacy Consult for heparin Indication: History of PE on Eliquis prior to admission   Allergies  Allergen Reactions   Glimepiride Diarrhea and Other (See Comments)   Glipizide Other (See Comments)   Lisinopril Other (See Comments)    headache   Losartan Other (See Comments)    Headache   Norco [Hydrocodone-Acetaminophen] Other (See Comments)    Mood changes   Pioglitazone Swelling    On legs   Penicillins Diarrhea and Rash    Patient Measurements: Weight: 61.8 kg (136 lb 3.2 oz) Heparin Dosing Weight: 61.8 kg  Vital Signs: Temp: 98 F (36.7 C) (05/18 1125) BP: 110/58 (05/18 1125) Pulse Rate: 82 (05/18 1125)  Labs: Recent Labs    03/09/22 1507 03/10/22 0433 03/11/22 0359  HGB  --  8.9* 9.0*  HCT  --  28.9* 29.9*  PLT  --  515* 489*  CREATININE 1.95* 1.59* 1.56*    Estimated Creatinine Clearance: 27 mL/min (A) (by C-G formula based on SCr of 1.56 mg/dL (H)).   Medical History: Past Medical History:  Diagnosis Date   Diabetes mellitus without complication ([place])    Endometrial cancer ([place]) 01/29/2022   High cholesterol    Hypertension     Medications:  Eliquis 5 mg BID, last dose 5/17 at 2126  Assessment: 76 year old female with past medical history of T2DM, metastatic endometrial cancer, history of PE on Eliquis. Patient with persistent nausea and vomiting not able to tolerate PO medications at this time. Pharmacy has been consulted for heparin dosing.   Baseline labs: Hgb 9.0, plts 489  Goal of Therapy:  Heparin level 0.3-0.7 units/ml aPTT 66-102 seconds Monitor platelets by anticoagulation protocol: Yes   Plan:  Start heparin infusion at 1000 units/hr without bolus  Check aPTT level in 8 hours and daily while on heparin. Will monitor and adjust heparin dosing based on aPTT until aPTT and heparin level correlate.  Continue to monitor H&H and platelets  Darnelle Bos, PharmD 03/12/2022,11:57 AM

## 2022-03-12 NOTE — Consult Note (Signed)
[place] for heparin Indication: History of PE on Eliquis prior to admission   Allergies  Allergen Reactions   Glimepiride Diarrhea and Other (See Comments)   Glipizide Other (See Comments)   Lisinopril Other (See Comments)    headache   Losartan Other (See Comments)    Headache   Norco [Hydrocodone-Acetaminophen] Other (See Comments)    Mood changes   Pioglitazone Swelling    On legs   Penicillins Diarrhea and Rash    Patient Measurements: Height: '5\' 2"'$  (157.5 cm) Weight: 61.8 kg (136 lb 3.9 oz) IBW/kg (Calculated) : 50.1 Heparin Dosing Weight: 61.8 kg  Vital Signs: Temp: 98.3 F (36.8 C) (05/18 2327) Temp Source: Oral (05/18 2327) BP: 98/66 (05/18 2327) Pulse Rate: 94 (05/18 2327)  Labs: Recent Labs    03/10/22 0433 03/11/22 0359 03/12/22 2155  HGB 8.9* 9.0*  --   HCT 28.9* 29.9*  --   PLT 515* 489*  --   APTT  --   --  >200*  CREATININE 1.59* 1.56*  --      Estimated Creatinine Clearance: 27 mL/min (A) (by C-G formula based on SCr of 1.56 mg/dL (H)).   Medical History: Past Medical History:  Diagnosis Date   Diabetes mellitus without complication ([place])    Endometrial cancer ([place]) 01/29/2022   High cholesterol    Hypertension     Medications:  Eliquis 5 mg BID, last dose 5/17 at 2126  Assessment: 76 year old female with past medical history of T2DM, metastatic endometrial cancer, history of PE on Eliquis. Patient with persistent nausea and vomiting not able to tolerate PO medications at this time. Pharmacy has been consulted for heparin dosing.   Baseline labs: Hgb 9.0, plts 489  Goal of Therapy:  Heparin level 0.3-0.7 units/ml aPTT 66-102 seconds Monitor platelets by anticoagulation protocol: Yes  5/18 2155 aPTT > 200   Plan:  Hold heparin infusion for 1 hour. Restart heparin infusion at 800 units/hr  Check aPTT level in 8 hrs after restart  Will monitor and adjust heparin dosing based on aPTT until  aPTT and heparin level correlate.  CBC and HL daily while on heparin  Renda Rolls, PharmD, [facility] 03/12/2022 11:46 PM

## 2022-03-12 NOTE — Progress Notes (Signed)
Spoke with Denice Paradise RN re no additional PIV needed for Heparin d/t compatible with NS.

## 2022-03-13 DIAGNOSIS — R112 Nausea with vomiting, unspecified: Secondary | ICD-10-CM | POA: Diagnosis not present

## 2022-03-13 DIAGNOSIS — R180 Malignant ascites: Secondary | ICD-10-CM | POA: Diagnosis not present

## 2022-03-13 DIAGNOSIS — R1110 Vomiting, unspecified: Secondary | ICD-10-CM | POA: Diagnosis not present

## 2022-03-13 DIAGNOSIS — N179 Acute kidney failure, unspecified: Secondary | ICD-10-CM | POA: Diagnosis not present

## 2022-03-13 LAB — URINALYSIS, ROUTINE W REFLEX MICROSCOPIC
Bacteria, UA: NONE SEEN
Bilirubin Urine: NEGATIVE
Glucose, UA: 50 mg/dL — AB
Hgb urine dipstick: NEGATIVE
Ketones, ur: 20 mg/dL — AB
Leukocytes,Ua: NEGATIVE
Nitrite: NEGATIVE
Protein, ur: 30 mg/dL — AB
Specific Gravity, Urine: 1.024 (ref 1.005–1.030)
pH: 5 (ref 5.0–8.0)

## 2022-03-13 LAB — BASIC METABOLIC PANEL
Anion gap: 15 (ref 5–15)
BUN: 30 mg/dL — ABNORMAL HIGH (ref 8–23)
CO2: 17 mmol/L — ABNORMAL LOW (ref 22–32)
Calcium: 8.1 mg/dL — ABNORMAL LOW (ref 8.9–10.3)
Chloride: 112 mmol/L — ABNORMAL HIGH (ref 98–111)
Creatinine, Ser: 1.46 mg/dL — ABNORMAL HIGH (ref 0.44–1.00)
GFR, Estimated: 37 mL/min — ABNORMAL LOW (ref >60)
Glucose, Bld: 109 mg/dL — ABNORMAL HIGH (ref 70–99)
Potassium: 3.7 mmol/L (ref 3.5–5.1)
Sodium: 144 mmol/L (ref 135–145)

## 2022-03-13 LAB — CBC
HCT: 33.6 % — ABNORMAL LOW (ref 36.0–46.0)
Hemoglobin: 10 g/dL — ABNORMAL LOW (ref 12.0–15.0)
MCH: 27.6 pg (ref 26.0–34.0)
MCHC: 29.8 g/dL — ABNORMAL LOW (ref 30.0–36.0)
MCV: 92.8 fL (ref 80.0–100.0)
Platelets: 508 10*3/uL — ABNORMAL HIGH (ref 150–400)
RBC: 3.62 MIL/uL — ABNORMAL LOW (ref 3.87–5.11)
RDW: 17.7 % — ABNORMAL HIGH (ref 11.5–15.5)
WBC: 9.9 10*3/uL (ref 4.0–10.5)
nRBC: 0.4 % — ABNORMAL HIGH (ref 0.0–0.2)

## 2022-03-13 LAB — APTT
aPTT: >200 seconds (ref 24–36)
aPTT: >200 seconds (ref 24–36)

## 2022-03-13 LAB — GLUCOSE, CAPILLARY
Glucose-Capillary: 96 mg/dL (ref 70–99)
Glucose-Capillary: 89 mg/dL (ref 70–99)
Glucose-Capillary: 104 mg/dL — ABNORMAL HIGH (ref 70–99)
Glucose-Capillary: 94 mg/dL (ref 70–99)

## 2022-03-13 LAB — HEPARIN LEVEL (UNFRACTIONATED)
Heparin Unfractionated: >1.1 IU/mL — ABNORMAL HIGH (ref 0.30–0.70)
Heparin Unfractionated: >1.1 IU/mL — ABNORMAL HIGH (ref 0.30–0.70)

## 2022-03-13 MED ORDER — HEPARIN (PORCINE) 25000 UT/250ML-% IV SOLN: 400.0000 [IU]/h | INTRAVENOUS | Status: DC

## 2022-03-13 MED ORDER — HEPARIN (PORCINE) 25000 UT/250ML-% IV SOLN: 350.0000 [IU]/h | INTRAVENOUS | Status: DC

## 2022-03-13 MED ORDER — MORPHINE SULFATE (PF) 2 MG/ML IV SOLN
2.0000 mg | INTRAVENOUS | Status: DC | PRN
  Administered 2022-03-13 – 2022-03-15 (×3): 2 mg via INTRAVENOUS
  Filled 2022-03-13 (×3): qty 1

## 2022-03-13 MED ORDER — ONDANSETRON HCL 4 MG/2ML IJ SOLN
4.0000 mg | Freq: Four times a day (QID) | INTRAMUSCULAR | Status: DC
  Administered 2022-03-13 – 2022-03-15 (×9): 4 mg via INTRAVENOUS
  Filled 2022-03-13 (×9): qty 2

## 2022-03-13 MED ORDER — METOCLOPRAMIDE HCL 5 MG/ML IJ SOLN
5.0000 mg | Freq: Four times a day (QID) | INTRAMUSCULAR | Status: DC | PRN
  Administered 2022-03-13 – 2022-03-15 (×5): 5 mg via INTRAVENOUS
  Filled 2022-03-13 (×6): qty 2

## 2022-03-13 MED ORDER — HEPARIN (PORCINE) 25000 UT/250ML-% IV SOLN
550.0000 [IU]/h | INTRAVENOUS | Status: DC
  Administered 2022-03-13: 550 [IU]/h via INTRAVENOUS
  Filled 2022-03-13: qty 250

## 2022-03-13 MED ORDER — PROCHLORPERAZINE EDISYLATE 10 MG/2ML IJ SOLN
10.0000 mg | INTRAMUSCULAR | Status: DC | PRN
  Administered 2022-03-13 – 2022-03-15 (×4): 10 mg via INTRAVENOUS
  Filled 2022-03-13 (×4): qty 2

## 2022-03-13 NOTE — Progress Notes (Signed)
Critical lab value PTT over 200 notified Dr. Maryland Pink [phone].

## 2022-03-13 NOTE — Consult Note (Incomplete)
[place] for heparin Indication: History of PE on Eliquis prior to admission   Allergies  Allergen Reactions   Glimepiride Diarrhea and Other (See Comments)   Glipizide Other (See Comments)   Lisinopril Other (See Comments)    headache   Losartan Other (See Comments)    Headache   Norco [Hydrocodone-Acetaminophen] Other (See Comments)    Mood changes   Pioglitazone Swelling    On legs   Penicillins Diarrhea and Rash    Patient Measurements: Height: '5\' 2"'$  (157.5 cm) Weight: 61.8 kg (136 lb 3.9 oz) IBW/kg (Calculated) : 50.1 Heparin Dosing Weight: 61.8 kg  Vital Signs: Temp: 98.3 F (36.8 C) (05/19 2015) Temp Source: Oral (05/19 2015) BP: 121/76 (05/19 2015) Pulse Rate: 110 (05/19 2015)  Labs: Recent Labs    03/11/22 0359 03/12/22 2155 03/13/22 0409 03/13/22 0846 03/13/22 1952  HGB 9.0*  --  10.0*  --   --   HCT 29.9*  --  33.6*  --   --   PLT 489*  --  508*  --   --   APTT  --  >200*  --  >200*  --   HEPARINUNFRC  --   --   --  >1.10* >1.10*  CREATININE 1.56*  --  1.46*  --   --      Estimated Creatinine Clearance: 28.8 mL/min (A) (by C-G formula based on SCr of 1.46 mg/dL (H)).   Medical History: Past Medical History:  Diagnosis Date   Diabetes mellitus without complication ([place])    Endometrial cancer ([place]) 01/29/2022   High cholesterol    Hypertension     Medications:  Eliquis 5 mg BID, last dose 5/17 at 2126  Assessment: 76 year old female with past medical history of T2DM, metastatic endometrial cancer, history of PE on Eliquis. Patient with persistent nausea and vomiting not able to tolerate PO medications at this time. Pharmacy has been consulted for heparin dosing.   Baseline labs: Hgb 9.0, plts 489  Goal of Therapy:  Heparin level 0.3-0.7 units/ml aPTT 66-102 seconds Monitor platelets by anticoagulation protocol: Yes  Date Time aPTT/HL Rate/Comment 5/18 2155 aPTT > 200 Supratherapeutic   Plan:   Hold heparin infusion for 1 hour. Restart heparin infusion at 550 units/hr  Check aPTT level in 8 hrs after restart  Will monitor and adjust heparin dosing based on aPTT until aPTT and heparin level correlate.  CBC and HL daily while on heparin  Lorna Dibble, PharmD, MS PGPM Clinical Pharmacist 03/13/2022 8:39 PM

## 2022-03-13 NOTE — TOC Progression Note (Signed)
Transition of Care [facility]) - Progression Note    Patient Details  Name: Sabrina [name] MRN: [id number] Date of Birth: 08/03/1946  Transition of Care [facility]) CM/SW Contact  Shelbie Hutching, RN Phone Number: 03/13/2022, 10:27 AM  Clinical Narrative:    Per MD patient's family would like to go with a different hospice agency.  RNCM called and left a message with patient's daughter, [name] for return call.  RNCM reached out to [facility] with [facility] for referral for hospice at home.  Lorayne Bender will wait to speak with family until I hear back from daughter.   Expected Discharge Plan:  (TBD) Barriers to Discharge: Continued Medical Work up  Expected Discharge Plan and Services Expected Discharge Plan:  (TBD)     Post Acute Care Choice:  (TBD) Living arrangements for the past 2 months: Single Family Home                                       Social Determinants of Health (SDOH) Interventions    Readmission Risk Interventions    03/10/2022    9:23 AM  Readmission Risk Prevention Plan  Transportation Screening Complete  Medication Review (RN Care Manager) Complete  PCP or Specialist appointment within 3-5 days of discharge Complete  SW Recovery [place] Not Applicable

## 2022-03-13 NOTE — Consult Note (Signed)
[place] for heparin Indication: History of PE on Eliquis prior to admission   Allergies  Allergen Reactions   Glimepiride Diarrhea and Other (See Comments)   Glipizide Other (See Comments)   Lisinopril Other (See Comments)    headache   Losartan Other (See Comments)    Headache   Norco [Hydrocodone-Acetaminophen] Other (See Comments)    Mood changes   Pioglitazone Swelling    On legs   Penicillins Diarrhea and Rash    Patient Measurements: Height: '5\' 2"'$  (157.5 cm) Weight: 61.8 kg (136 lb 3.9 oz) IBW/kg (Calculated) : 50.1 Heparin Dosing Weight: 61.8 kg  Vital Signs: Temp: 98.3 F (36.8 C) (05/19 2015) Temp Source: Oral (05/19 2015) BP: 121/76 (05/19 2015) Pulse Rate: 110 (05/19 2015)  Labs: Recent Labs    03/11/22 0359 03/12/22 2155 03/13/22 0409 03/13/22 0846 03/13/22 1952  HGB 9.0*  --  10.0*  --   --   HCT 29.9*  --  33.6*  --   --   PLT 489*  --  508*  --   --   APTT  --  >200*  --  >200* >200*  HEPARINUNFRC  --   --   --  >1.10* >1.10*  CREATININE 1.56*  --  1.46*  --   --      Estimated Creatinine Clearance: 28.8 mL/min (A) (by C-G formula based on SCr of 1.46 mg/dL (H)).   Medical History: Past Medical History:  Diagnosis Date   Diabetes mellitus without complication ([place])    Endometrial cancer ([place]) 01/29/2022   High cholesterol    Hypertension     Medications:  Eliquis 5 mg BID, last dose 5/17 at 2126  Assessment: 76 year old female with past medical history of T2DM, metastatic endometrial cancer, history of PE on Eliquis. Patient with persistent nausea and vomiting not able to tolerate PO medications at this time. Pharmacy has been consulted for heparin dosing.   Baseline labs: Hgb 9.0, plts 489  Goal of Therapy:  Heparin level 0.3-0.7 units/ml aPTT 66-102 seconds Monitor platelets by anticoagulation protocol: Yes  5/18 2155 aPTT > 200 5/19 1952 aPTT > 200, HL > 1.1   Plan:  Hold heparin  infusion for 1 hour. Restart heparin infusion at 350 units/hr  Check aPTT level in 8 hrs after restart  Will monitor and adjust heparin dosing based on aPTT until aPTT and heparin level correlate.  CBC and HL daily while on heparin  Renda Rolls, PharmD, [facility] 03/13/2022 10:57 PM

## 2022-03-13 NOTE — Progress Notes (Addendum)
[facility]) Hospital Liaison Note   Received request from Transitions of Care Manager, Pryor Montes, for hospice services at home after discharge. MSW contacted daughter/[name] to confirm interest in services @ 1:03p with no response. VM left stating purpose of call and requesting call be returned. MSW also visited patient at bedside to confirm that daughter was not present--she was not.    Addendum: Call returned by daughter/Melinda and MSW provided education related to hospice philosophy, services, and team approach to care. She verbalized understanding of information given. Per discussion, the plan is for patient to discharge home via EMS once cleared to DC.    DME needs discussed. Patient has the following equipment in the home: -Hospital Bed -Bedside Table Patient requests the following equipment for delivery: Hospital Bed Wheelchair  Bedside table  Address verified and is correct in the chart. Rip Harbour  is the family member to contact to arrange time of equipment delivery.    Please send signed and completed DNR home with patient/family. Please provide prescriptions at discharge as needed to ensure ongoing symptom management.    [facility] information and contact numbers given to family & above information shared with TOC.   Please call with any questions/concerns.    Thank you for the opportunity to participate in this patient's care.   Daphene Calamity, MSW [facility] Liaison  [phone]

## 2022-03-13 NOTE — TOC Progression Note (Signed)
Transition of Care [facility]) - Progression Note    Patient Details  Name: Sabrina [name] MRN: [id number] Date of Birth: 1946-02-03  Transition of Care [facility]) CM/SW Contact  Shelbie Hutching, RN Phone Number: 03/13/2022, 3:20 PM  Clinical Narrative:    RNCM spoke with patient's daughter, [name] via phone.  Patient was open with [facility], they are wanting to discharge patient and go and pick up their equipment from the home.  Daughter would like to see if [facility] could accept her mother, she does not think that she can care for her at home any longer with the vomiting.  Explained that for patient to qualify for hospice home she would have to be a DNR.  Daughter does not want to make that decision she wants the patient to decide.  Patient has cognitive decline and mental illness, per the daughter the patient previously voiced not wanting extreme measures and not wanting to be kept alive on a vent in an ICU.  RNCM has given hospice referral to [facility] with [facility].  RNCM has also reached out to Palliative care to see if they can meet with patient and family.  Palliative NP is not sure that she will make it to speak with patient today.    TOC will cont to follow.      Expected Discharge Plan:  (TBD) Barriers to Discharge: Continued Medical Work up  Expected Discharge Plan and Services Expected Discharge Plan:  (TBD) In-house Referral: Hospice / Palliative Care Discharge Planning Services: CM Consult Post Acute Care Choice:  (TBD) Living arrangements for the past 2 months: Single Family Home                                       Social Determinants of Health (SDOH) Interventions    Readmission Risk Interventions    03/10/2022    9:23 AM  Readmission Risk Prevention Plan  Transportation Screening Complete  Medication Review (RN Care Manager) Complete  PCP or Specialist appointment within 3-5 days of discharge Complete  SW Recovery [place] Not Applicable

## 2022-03-13 NOTE — Progress Notes (Signed)
[facility] Progress Note  Patient: Sabrina [name]    SFK:[id number]  DOA: 03/09/2022    Date of Service: the patient was seen and examined on 03/13/2022  Brief hospital course: 76 year old female with past medical history of diabetes mellitus, bipolar disorder, recent hospitalization last month for acute kidney injury and stage IV adenocarcinoma of the endometrium presented to the emergency room on 5/14 with 3 days of vomiting and abdominal pain.  Patient was originally scheduled for paracentesis on 5/12 secondary to ascites from her endometrial cancer, but missed that appointment.  Patient found to have urinary tract infection and acute kidney injury.  Admitted to the hospitalist service.  Interventional radiology evaluated patient and performed an ultrasound-guided paracentesis removing 5.4 L of fluid.  Patient previously had been followed by outpatient hospice, but was discharged due to continued emergency room presentations and admissions for acute medical issues.  Since hospitalization, patient continues to have episodes of intractable vomiting, not very well controlled despite IV medications.  Assessment and Plan: Assessment and Plan: * Intractable vomiting Suspect secondary to her cancer.  Abdominal x-ray unrevealing.  Keep some liquids down at times.  Treating with IV Zosyn and Compazine for refractory.  Symptoms persistent.  Did not do well with scheduled dissolvable Zofran.  Trying scheduled IV Zofran with Compazine as needed.  Long-term however, she continues in decline.  See below.  Urinary tract infection IV Rocephin  UCX multiple species.  We will repeat urinalysis given persistent nausea and vomiting to ensure not resistant to Rocephin.  Acute kidney injury superimposed on chronic kidney disease llla(HCC) Creatinine 2.7 on admission, above baseline of 1.06, prerenal secondary to intractable vomiting.  Improved slightly today down to 1.46. Trying gentle hydration with seems  to be working, however this is at the cost of more ascites as patient's belly has become more distended each day.  Ascites, malignant CT abdomen and pelvis showing large volume malignant abdominopelvic ascites with omental caking and peritoneal implants, grossly unchanged IR paracentesis 05/15 removed 5+ L   High anion gap metabolic acidosis IV hydration with sodium bicarb  Continuing on OV fluids w/ NS AG WNL on labs morning 05/16  Type 2 diabetes mellitus with hyperlipidemia (HCC) Sliding scale insulin coverage  Hyperkalemia Resolved Continuous cardiac monitoring can d/c if no events overnight   Lactic acidosis Resolved, secondary to intractable nausea and vomiting.  Sepsis has been ruled out.  Elevated lipase Suspect reactive but continue to trend Pancreas within normal limits on CT  Thrombocytosis Likely reactive Plt trending down   Chronic anticoagulation secondary to history of saddle embolus Restarted apixiban since paracentesis completed   Goals of care, counseling/discussion Very poor prognosis,  she does not seem to have a realistic understanding of the severity of her illness.  Some of this is in part due to her mental health issues and at times, patient is in denial that she has advanced metastatic cancer.  Her daughter seems hesitant to make the palliative care decisions for her. Patient is full code, which daughter states was a decision that was made with hospice recently but she was a bit surprised by this since the patient has voiced in the past after watching her sister go through being on and off of life support that she would not want such extraordinary measures taken for herself, daughter is questioning how much patient's dementia might be playing a role in her thinking.  I have stressed to the patient's daughter that I believe CPR/Intubation would be futile interventions  that would prolong suffering [name] states that patient has been evaluated by the  oncology palliative care NP and does not want to see that person again, I will place a consult for palliative and hopefully a different provider might make a better connection with her  Endometrial cancer ([place]) CT abdomen and pelvis showing large volume malignant abdominopelvic ascites with omental caking and peritoneal implants, grossly unchanged Palliative care to see to reestablish goals of care Spoke with IR, who recommended that can plan for palliative drain placement once she has completed treatment for UTI, they can place this, possibly as early as 5/22  History of behavioral and mental health problems Zyprexa restarted.  Changed to IM when needed due to refusal to take p.o. due to nausea and vomiting.  Patient at times still remains somewhat paranoid resisting conversations about hospice  Diabetes mellitus without complication ([place]) Sliding scale insulin coverage       Body mass index is 24.92 kg/m.        Consultants: Palliative care Interventional radiology  Procedures: Status post paracentesis done 5/15  Antimicrobials: IV Rocephin 5/15-present  Code Status: Full code   Subjective: Patient complains of continued nausea and vomiting.  Does not want to take her oral medications because she gets sick afterwards  Objective: Vital signs were reviewed and unremarkable. Vitals:   03/13/22 0859 03/13/22 1308  BP: (!) 107/57 (!) 115/57  Pulse: 86 96  Resp: 15 15  Temp: (!) 97.5 F (36.4 C) 97.9 F (36.6 C)  SpO2: 100% 98%    Intake/Output Summary (Last 24 hours) at 03/13/2022 1319 Last data filed at 03/13/2022 0600 Gross per 24 hour  Intake 713.93 ml  Output --  Net 713.93 ml   Filed Weights   03/09/22 1031 03/12/22 1300  Weight: 61.8 kg 61.8 kg   Body mass index is 24.92 kg/m.  Exam:  General: Fatigued, mild distress from nausea HEENT: Normocephalic atraumatic, mucous membranes slightly dry Cardiovascular: Regular rate and rhythm,  S1-S2 Respiratory: Clear to auscultation bilaterally Abdomen: Soft, increasing distention, nontender, hypoactive bowel sounds Musculoskeletal: No clubbing or cyanosis, trace pitting edema Skin: No skin breaks, tears or lesions Psychiatry: Appropriate, no evidence of psychoses Neurology: No focal deficits  Data Reviewed: CBG stable  Disposition:  Status is: Inpatient Remains inpatient appropriate because: Establishment of goals of care, nausea and vomiting resolved    Anticipated discharge date: To be determined Family Communication: Brother at the bedside DVT Prophylaxis: SCDs Start: 03/09/22 0424    Author: Annita Brod ,MD 03/13/2022 1:19 PM  To reach On-call, see care teams to locate the attending and reach out via [email or web address]. Between 7PM-7AM, please contact night-coverage If you still have difficulty reaching the attending provider, please page the [facility] (Director on Call) for [facility] on amion for assistance.

## 2022-03-13 NOTE — Care Management Important Message (Signed)
Important Message  Patient Details  Name: Sabrina [name] MRN: [id number] Date of Birth: [birth year]   Medicare Important Message Given:  Yes     Sabrina [name] 03/13/2022, 10:22 AM

## 2022-03-13 NOTE — Consult Note (Signed)
[place] for heparin Indication: History of PE on Eliquis prior to admission   Allergies  Allergen Reactions   Glimepiride Diarrhea and Other (See Comments)   Glipizide Other (See Comments)   Lisinopril Other (See Comments)    headache   Losartan Other (See Comments)    Headache   Norco [Hydrocodone-Acetaminophen] Other (See Comments)    Mood changes   Pioglitazone Swelling    On legs   Penicillins Diarrhea and Rash    Patient Measurements: Height: '5\' 2"'$  (157.5 cm) Weight: 61.8 kg (136 lb 3.9 oz) IBW/kg (Calculated) : 50.1 Heparin Dosing Weight: 61.8 kg  Vital Signs: Temp: 97.5 F (36.4 C) (05/19 0859) Temp Source: Oral (05/19 0428) BP: 107/57 (05/19 0859) Pulse Rate: 86 (05/19 0859)  Labs: Recent Labs    03/11/22 0359 03/12/22 2155 03/13/22 0409 03/13/22 0846  HGB 9.0*  --  10.0*  --   HCT 29.9*  --  33.6*  --   PLT 489*  --  508*  --   APTT  --  >200*  --  >200*  HEPARINUNFRC  --   --   --  >1.10*  CREATININE 1.56*  --  1.46*  --      Estimated Creatinine Clearance: 28.8 mL/min (A) (by C-G formula based on SCr of 1.46 mg/dL (H)).   Medical History: Past Medical History:  Diagnosis Date   Diabetes mellitus without complication ([place])    Endometrial cancer ([place]) 01/29/2022   High cholesterol    Hypertension     Medications:  Eliquis 5 mg BID, last dose 5/17 at 2126  Assessment: 76 year old female with past medical history of T2DM, metastatic endometrial cancer, history of PE on Eliquis. Patient with persistent nausea and vomiting not able to tolerate PO medications at this time. Pharmacy has been consulted for heparin dosing.   Baseline labs: Hgb 9.0, plts 489  Goal of Therapy:  Heparin level 0.3-0.7 units/ml aPTT 66-102 seconds Monitor platelets by anticoagulation protocol: Yes  5/18 2155 aPTT > 200   Plan:  Hold heparin infusion for 1 hour. Restart heparin infusion at 550 units/hr  Check aPTT level in  8 hrs after restart  Will monitor and adjust heparin dosing based on aPTT until aPTT and heparin level correlate.  CBC and HL daily while on heparin  Darrick Penna, PharmD, MS PGPM Clinical Pharmacist 03/13/2022 10:58 AM

## 2022-03-13 NOTE — Progress Notes (Signed)
Dr. Maryland Pink notified of pt's bladder scan. Orders received for straight  cath.

## 2022-03-14 DIAGNOSIS — R1110 Vomiting, unspecified: Secondary | ICD-10-CM | POA: Diagnosis not present

## 2022-03-14 DIAGNOSIS — N179 Acute kidney failure, unspecified: Secondary | ICD-10-CM | POA: Diagnosis not present

## 2022-03-14 DIAGNOSIS — R180 Malignant ascites: Secondary | ICD-10-CM | POA: Diagnosis not present

## 2022-03-14 DIAGNOSIS — R112 Nausea with vomiting, unspecified: Secondary | ICD-10-CM | POA: Diagnosis not present

## 2022-03-14 LAB — CBC
HCT: 33.3 % — ABNORMAL LOW (ref 36.0–46.0)
Hemoglobin: 9.8 g/dL — ABNORMAL LOW (ref 12.0–15.0)
MCH: 27.5 pg (ref 26.0–34.0)
MCHC: 29.4 g/dL — ABNORMAL LOW (ref 30.0–36.0)
MCV: 93.3 fL (ref 80.0–100.0)
Platelets: 465 10*3/uL — ABNORMAL HIGH (ref 150–400)
RBC: 3.57 MIL/uL — ABNORMAL LOW (ref 3.87–5.11)
RDW: 18.3 % — ABNORMAL HIGH (ref 11.5–15.5)
WBC: 11.3 10*3/uL — ABNORMAL HIGH (ref 4.0–10.5)
nRBC: 0.8 % — ABNORMAL HIGH (ref 0.0–0.2)

## 2022-03-14 LAB — BASIC METABOLIC PANEL
Anion gap: 16 — ABNORMAL HIGH (ref 5–15)
BUN: 31 mg/dL — ABNORMAL HIGH (ref 8–23)
CO2: 16 mmol/L — ABNORMAL LOW (ref 22–32)
Calcium: 8.4 mg/dL — ABNORMAL LOW (ref 8.9–10.3)
Chloride: 117 mmol/L — ABNORMAL HIGH (ref 98–111)
Creatinine, Ser: 1.7 mg/dL — ABNORMAL HIGH (ref 0.44–1.00)
GFR, Estimated: 31 mL/min — ABNORMAL LOW (ref >60)
Glucose, Bld: 112 mg/dL — ABNORMAL HIGH (ref 70–99)
Potassium: 4 mmol/L (ref 3.5–5.1)
Sodium: 149 mmol/L — ABNORMAL HIGH (ref 135–145)

## 2022-03-14 LAB — GLUCOSE, CAPILLARY
Glucose-Capillary: 110 mg/dL — ABNORMAL HIGH (ref 70–99)
Glucose-Capillary: 111 mg/dL — ABNORMAL HIGH (ref 70–99)

## 2022-03-14 LAB — APTT: aPTT: 67 seconds — ABNORMAL HIGH (ref 24–36)

## 2022-03-14 LAB — HEPARIN LEVEL (UNFRACTIONATED): Heparin Unfractionated: >1.1 IU/mL — ABNORMAL HIGH (ref 0.30–0.70)

## 2022-03-14 MED ORDER — ACETAMINOPHEN 500 MG PO TABS
1000.0000 mg | ORAL_TABLET | Freq: Four times a day (QID) | ORAL | Status: DC | PRN
  Administered 2022-03-14: 1000 mg via ORAL
  Filled 2022-03-14: qty 2

## 2022-03-14 MED ORDER — HALOPERIDOL LACTATE 5 MG/ML IJ SOLN
5.0000 mg | Freq: Once | INTRAMUSCULAR | Status: AC
  Administered 2022-03-14: 5 mg via INTRAVENOUS
  Filled 2022-03-14: qty 1

## 2022-03-14 NOTE — Progress Notes (Signed)
[employer] [facility]) Hospice hospital liaison note  Patient is referred for hospice services at home after discharge. Patient's eligibility has been confirmed. DME scheduled for delivery to home today.   Please send signed and completed DNR home with patient/family. Please provide prescriptions at discharge as needed to ensure ongoing symptom management.    Please call with any questions/concerns.    Thank you for the opportunity to participate in this patient's care  Jhonnie Garner, BSN RN [facility] hospital liaison [phone]

## 2022-03-14 NOTE — Progress Notes (Signed)
[facility] Progress Note  Patient: Sabrina [name]    IOX:[id number]  DOA: 03/09/2022    Date of Service: the patient was seen and examined on 03/14/2022  Brief hospital course: 76 year old female with past medical history of diabetes mellitus, bipolar disorder, recent hospitalization last month for acute kidney injury and stage IV adenocarcinoma of the endometrium presented to the emergency room on 5/14 with 3 days of vomiting and abdominal pain.  Patient was originally scheduled for paracentesis on 5/12 secondary to ascites from her endometrial cancer, but missed that appointment.  Patient found to have urinary tract infection and acute kidney injury.  Admitted to the hospitalist service.  Interventional radiology evaluated patient and performed an ultrasound-guided paracentesis removing 5.4 L of fluid.  Patient previously had been followed by outpatient hospice, but was discharged due to continued emergency room presentations and admissions for acute medical issues.  Since hospitalization, patient continues to have episodes of intractable vomiting, not very well controlled despite IV medications.  After extensive discussion with patient and family, patient and family have opted for hospice and comfort care.  Patient amenable to changing to DNR which daughter who is closest relative supports.  Assessment and Plan: Assessment and Plan: * Intractable vomiting Secondary to worsening cancer.  Abdominal x-ray unrevealing.  Keep some liquids down at times.  Treating with IV Zosyn and Compazine for refractory.  Symptoms persistent.  Did not do well with scheduled dissolvable Zofran.  Trying scheduled IV Zofran with Compazine as needed.  Endometrial cancer ([place]) CT abdomen and pelvis showing large volume malignant abdominopelvic ascites with omental caking and peritoneal implants, grossly unchanged.  Unfortunately, patient's nausea and now lack of p.o. intake indicate that she is much closer to  death.  Had extensive discussion with patient's family and then with patient.  As per their wishes, patient is now comfort care and will make arrangements to look at hospice facility. Initial plan to place drain for recurrent ascites now discontinued since she is now comfort care.  History of behavioral and mental health problems Patient at times refusing to take her p.o. meds including Zyprexa.  She is much more aggressive and paranoid over the last 1 to 2 days.  The decisions for CODE STATUS and comfort care came from her daughter and brother, although both wanted patient to be amenable to this plan.  Although this was difficult as the patient was paranoid and suspicious, the patient does understand that she is dying and she does not want anything further done.  Although we are stopping many of her medicines, we will continue Zyprexa either by p.o. or IM to help with her paranoia and aggressiveness.  Acute kidney injury superimposed on chronic kidney disease llla(HCC) Creatinine 2.7 on admission, above baseline of 1.06, prerenal secondary to intractable vomiting.  Creatinine slightly improved with IV fluids, but then worsens with vomiting. Gentle hydration does little although contributes to her ascites.  Have stopped IV fluids now that she is comfort care.  Lactic acidosis Resolved, secondary to intractable nausea and vomiting.  Sepsis has been ruled out.  Goals of care, counseling/discussion Patient is dying at this point.  With her mental health issues and dementia, she does not necessarily have a good understanding of this.  Her daughter who is the main default power of attorney along with her siblings are opting for DNR and comfort care, although they wanted the patient to be amenable to these decisions.  Patient although suspicious did state that she did not want  anything else done and she did not want to be resuscitated.  Patient changed to DNR and we have made her comfort care and are making  arrangements for hospice facility.  Elevated lipase Suspect reactive but continue to trend Pancreas within normal limits on CT  Thrombocytosis Likely reactive Plt trending down   Ascites, malignant CT abdomen and pelvis showing large volume malignant abdominopelvic ascites with omental caking and peritoneal implants, grossly unchanged IR paracentesis 05/15 removed 5+ L    High anion gap metabolic acidosis IV hydration with sodium bicarb  Continuing on OV fluids w/ NS AG WNL on labs morning 05/16 We will stop checking labs for comfort care  Type 2 diabetes mellitus with hyperlipidemia (HCC) Sliding scale insulin coverage now stopped  Urinary tract infection IV Rocephin  UCX multiple species.  Repeat urinalysis unremarkable.  We have ruled out a recurrent UTI as cause of her nausea/vomiting  Hyperkalemia Resolved initially.  No further lab work for comfort care  Chronic anticoagulation secondary to history of saddle embolus Initially restarted apixiban since paracentesis completed.  Have discontinued now that she is comfort care  Diabetes mellitus without complication (HCC) Sliding scale insulin coverage       Body mass index is 24.92 kg/m.        Consultants: Palliative care Interventional radiology  Procedures: Status post paracentesis done 5/15  Antimicrobials: IV Rocephin 5/15-5/19  Code Status: Full code   Subjective: Patient complains of continued nausea and vomiting.    Objective: Vital signs were reviewed and unremarkable. Vitals:   03/14/22 0530 03/14/22 0842  BP: 115/60 114/65  Pulse: (!) 106 (!) 115  Resp: 16 14  Temp: 97.6 F (36.4 C) 98.6 F (37 C)  SpO2: 98% 98%    Intake/Output Summary (Last 24 hours) at 03/14/2022 1254 Last data filed at 03/14/2022 1100 Gross per 24 hour  Intake 1481.3 ml  Output 270 ml  Net 1211.3 ml    Filed Weights   03/09/22 1031 03/12/22 1300  Weight: 61.8 kg 61.8 kg   Body mass index is 24.92  kg/m.  Exam:  General: Irritable, does not want any medications HEENT: Normocephalic atraumatic, mucous membranes slightly dry Cardiovascular: Regular rate and rhythm, S1-S2 Respiratory: Clear to auscultation bilaterally Abdomen: Soft, increasing distention, nontender, hypoactive bowel sounds Musculoskeletal: No clubbing or cyanosis, trace pitting edema Skin: No skin breaks, tears or lesions Psychiatry: Responds although her answers are paranoid, mistrusting Neurology: No focal deficits  Data Reviewed: CBG stable  Disposition:  Status is: Inpatient Remains inpatient appropriate because: Hospice facility placement   Anticipated discharge date: When hospice bed available Family Communication: Brother, sister and daughter at the bedside DVT Prophylaxis: Discontinued for comfort care   Author: Annita Brod ,MD 03/14/2022 12:54 PM  To reach On-call, see care teams to locate the attending and reach out via [email or web address]. Between 7PM-7AM, please contact night-coverage If you still have difficulty reaching the attending provider, please page the [facility] (Director on Call) for [facility] on amion for assistance.

## 2022-03-14 NOTE — Consult Note (Signed)
[place] for heparin Indication: History of PE on Eliquis prior to admission   Allergies  Allergen Reactions   Glimepiride Diarrhea and Other (See Comments)   Glipizide Other (See Comments)   Lisinopril Other (See Comments)    headache   Losartan Other (See Comments)    Headache   Norco [Hydrocodone-Acetaminophen] Other (See Comments)    Mood changes   Pioglitazone Swelling    On legs   Penicillins Diarrhea and Rash    Patient Measurements: Height: '5\' 2"'$  (157.5 cm) Weight: 61.8 kg (136 lb 3.9 oz) IBW/kg (Calculated) : 50.1 Heparin Dosing Weight: 61.8 kg  Vital Signs: Temp: 97.6 F (36.4 C) (05/20 0530) Temp Source: Oral (05/20 0530) BP: 115/60 (05/20 0530) Pulse Rate: 106 (05/20 0530)  Labs: Recent Labs    03/13/22 0409 03/13/22 0846 03/13/22 1952 03/14/22 0429 03/14/22 0746  HGB 10.0*  --   --  9.8*  --   HCT 33.6*  --   --  33.3*  --   PLT 508*  --   --  465*  --   APTT  --  >200* >200*  --  67*  HEPARINUNFRC  --  >1.10* >1.10*  --  >1.10*  CREATININE 1.46*  --   --  1.70*  --      Estimated Creatinine Clearance: 24.7 mL/min (A) (by C-G formula based on SCr of 1.7 mg/dL (H)).   Medical History: Past Medical History:  Diagnosis Date   Diabetes mellitus without complication ([place])    Endometrial cancer ([place]) 01/29/2022   High cholesterol    Hypertension     Medications:  Eliquis 5 mg BID, last dose 5/17 at 2126  Assessment: 76 year old female with past medical history of T2DM, metastatic endometrial cancer, history of PE on Eliquis. Patient with persistent nausea and vomiting not able to tolerate PO medications at this time. Pharmacy has been consulted for heparin dosing.   Baseline labs: Hgb 9.0, plts 489  Goal of Therapy:  Heparin level 0.3-0.7 units/ml aPTT 66-102 seconds Monitor platelets by anticoagulation protocol: Yes  5/18 2155 aPTT > 200 5/19 1952 aPTT > 200, HL > 1.1 5/20 0746 aPTT 67, HL > 1.1     Plan:  aPTT is therapeutic but at lower limit was goal. Will increase the heparin infusion to 400 units/hr. Recheck aPTT in 8 hours. Heparin level and CBC daily while on heparin. Switch to heparin level once aPTT and heparin level and aPTT correlate.   Eleonore Chiquito PharmD,  03/14/2022 8:34 AM

## 2022-03-15 DIAGNOSIS — R180 Malignant ascites: Secondary | ICD-10-CM | POA: Diagnosis not present

## 2022-03-15 DIAGNOSIS — C541 Malignant neoplasm of endometrium: Secondary | ICD-10-CM

## 2022-03-15 DIAGNOSIS — N179 Acute kidney failure, unspecified: Secondary | ICD-10-CM | POA: Diagnosis not present

## 2022-03-15 DIAGNOSIS — R1110 Vomiting, unspecified: Secondary | ICD-10-CM | POA: Diagnosis not present

## 2022-03-15 MED ORDER — ONDANSETRON 4 MG PO TBDP: 4.0000 mg | ORAL_TABLET | Freq: Four times a day (QID) | ORAL | 0 refills | Status: AC

## 2022-03-15 MED ORDER — OLANZAPINE 5 MG PO TBDP: 5.0000 mg | ORAL_TABLET | Freq: Two times a day (BID) | ORAL | 0 refills | Status: DC

## 2022-03-15 MED ORDER — ONDANSETRON 4 MG PO TBDP: 4.0000 mg | ORAL_TABLET | Freq: Four times a day (QID) | ORAL | 0 refills | Status: DC

## 2022-03-15 MED ORDER — OLANZAPINE 5 MG PO TBDP: 5.0000 mg | ORAL_TABLET | Freq: Two times a day (BID) | ORAL | 0 refills | Status: AC

## 2022-03-15 MED ORDER — HALOPERIDOL LACTATE 5 MG/ML IJ SOLN
5.0000 mg | Freq: Once | INTRAMUSCULAR | Status: AC
  Administered 2022-03-15: 5 mg via INTRAVENOUS
  Filled 2022-03-15: qty 1

## 2022-03-15 NOTE — Progress Notes (Addendum)
Manufacturing engineer [facility]) Hospital Liaison Note  Received request from [employer] [place] for family interest in [place]. Patient was previously approved for services in the home on 5.19.   Approval for Hospice Home is determined by [facility] MD. Once [facility] MD has determined Hospice Home eligibility, [place] will update hospital staff and family. Eligibility is approved.  Unfortunately, Hospice Home is not able to offer a room today. Family and Lisa/TOC Manager aware hospital liaison will follow up tomorrow or sooner if a room becomes available.    Addendum: HH able to offer a bed for 5.21 & family accepted. Consent forms have been completed.  EMS notified of patient D/C and transport arranged by TOC/Lisa. Attending Physician/Dr. [place] Pink also notified of transport arrangement.   Please do not hesitate to call with any hospice related questions.    Thank you for the opportunity to participate in this patient's care.   Daphene Calamity, MSW [facility] Liaison [phone]

## 2022-03-15 NOTE — Discharge Summary (Signed)
Physician Discharge Summary   Patient: Sabrina [name] MRN: [id number] DOB: 05-Feb-1946  Admit date:     03/09/2022  Discharge date: 03/15/22  Discharge Physician: Annita Brod   PCP: System, Provider Not In   Recommendations at discharge:   Medication change: Patient's medications were discontinued except for dissolvable Zofran and Zyprexa. Patient being discharged to hospice facility  Discharge Diagnoses: Principal Problem:   Intractable vomiting Active Problems:   Endometrial cancer ([place])   History of behavioral and mental health problems   Acute kidney injury superimposed on chronic kidney disease llla(HCC)   Goals of care, counseling/discussion   Lactic acidosis   Elevated lipase   Thrombocytosis   Ascites, malignant   Type 2 diabetes mellitus with hyperlipidemia (HCC)   High anion gap metabolic acidosis   Urinary tract infection   Hyperkalemia   Chronic anticoagulation secondary to history of saddle embolus  Resolved Problems:   * No resolved hospital problems. *  Hospital Course: 76 year old female with past medical history of diabetes mellitus, bipolar disorder, recent hospitalization last month for acute kidney injury and stage IV adenocarcinoma of the endometrium presented to the emergency room on 5/14 with 3 days of vomiting and abdominal pain.  Patient was originally scheduled for paracentesis on 5/12 secondary to ascites from her endometrial cancer, but missed that appointment.  Patient found to have urinary tract infection and acute kidney injury.  Admitted to the hospitalist service.  Interventional radiology evaluated patient and performed an ultrasound-guided paracentesis removing 5.4 L of fluid.  Patient previously had been followed by outpatient hospice, but was discharged due to continued emergency room presentations and admissions for acute medical issues.  Since hospitalization, patient continues to have episodes of intractable vomiting, not very well  controlled despite IV medications.  After extensive discussion with patient and family, patient and family have opted for hospice and comfort care.  As per their wishes, patient made DNR.  Patient accepted at hospice facility on 5/21.  Assessment and Plan: * Intractable vomiting Secondary to worsening cancer.  Abdominal x-ray unrevealing.  Keep some liquids down at times.  Treating with IV Zosyn and Compazine for refractory.  Symptoms persistent.  Now that she is comfort care with no IV, sublingual dissolvable Zofran.  Endometrial cancer ([place]) CT abdomen and pelvis showing large volume malignant abdominopelvic ascites with omental caking and peritoneal implants, grossly unchanged.  Unfortunately, patient's nausea and now lack of p.o. intake indicate that she is much closer to death.  Had extensive discussion with patient's family and then with patient.  As per their wishes, patient is now comfort care and will make arrangements to look at hospice facility. Initial plan to place drain for recurrent ascites now discontinued since she is now comfort care.  History of behavioral and mental health problems Patient at times refusing to take her p.o. meds including Zyprexa.  She is much more aggressive and paranoid over the last 1 to 2 days.  The decisions for CODE STATUS and comfort care came from her daughter and brother, although both wanted patient to be amenable to this plan.  Although this was difficult as the patient was paranoid and suspicious, the patient does understand that she is dying and she does not want anything further done.  Stopped most of her medicines, we will continue Zyprexa either by p.o. or IM to help with her paranoia and aggressiveness.  Acute kidney injury superimposed on chronic kidney disease llla(HCC) Creatinine 2.7 on admission, above baseline of 1.06, prerenal  secondary to intractable vomiting.  Creatinine slightly improved with IV fluids, but then worsens with  vomiting. Gentle hydration does little although contributes to her ascites.  Have stopped IV fluids now that she is comfort care.  Lactic acidosis Resolved, secondary to intractable nausea and vomiting.  Sepsis has been ruled out.  Goals of care, counseling/discussion Patient is dying at this point.  With her mental health issues and dementia, she does not necessarily have a good understanding of this.  Her daughter who is the main default power of attorney along with her siblings are opting for DNR and comfort care, although they wanted the patient to be amenable to these decisions.  Patient although suspicious did state that she did not want anything else done and she did not want to be resuscitated.  Patient changed to DNR and we have made her comfort care.  Transfer to hospice facility 5/21  Elevated lipase Suspect reactive secondary to vomiting Pancreas within normal limits on CT  Thrombocytosis Likely reactive  Ascites, malignant CT abdomen and pelvis showing large volume malignant abdominopelvic ascites with omental caking and peritoneal implants, grossly unchanged IR paracentesis 05/15 removed 5+ L    High anion gap metabolic acidosis IV hydration with sodium bicarb  Continuing on OV fluids w/ NS AG WNL on labs morning 05/16 We will stop checking labs for comfort care  Type 2 diabetes mellitus with hyperlipidemia (HCC) Sliding scale insulin coverage now stopped  Urinary tract infection IV Rocephin  UCX multiple species.  Repeat urinalysis unremarkable.  We have ruled out a recurrent UTI as cause of her nausea/vomiting  Hyperkalemia Resolved initially.  No further lab work for comfort care  Chronic anticoagulation secondary to history of saddle embolus Initially restarted apixiban since paracentesis completed.  Have discontinued now that she is comfort care  Diabetes mellitus without complication ([place]) Sliding scale insulin coverage         Consultants:  Interventional radiology Procedures performed: Status post paracentesis done 5/15 Disposition: Hospice care Diet recommendation:  Discharge Diet Orders (From admission, onward)     Start     Ordered   03/15/22 0000  Diet clear liquid        03/15/22 1205           Clear liquid diet DISCHARGE MEDICATION: Allergies as of 03/15/2022       Reactions   Glimepiride Diarrhea, Other (See Comments)   Glipizide Other (See Comments)   Lisinopril Other (See Comments)   headache   Losartan Other (See Comments)   Headache   Norco [hydrocodone-acetaminophen] Other (See Comments)   Mood changes   Pioglitazone Swelling   On legs   Penicillins Diarrhea, Rash        Medication List     STOP taking these medications    Accu-Chek Softclix Lancets lancets   apixaban 5 MG Tabs tablet Commonly known as: ELIQUIS   pantoprazole 40 MG tablet Commonly known as: PROTONIX   polyethylene glycol 17 g packet Commonly known as: MIRALAX / GLYCOLAX   propranolol ER 60 MG 24 hr capsule Commonly known as: INDERAL LA       TAKE these medications    OLANZapine zydis 5 MG disintegrating tablet Commonly known as: ZYPREXA Take 1 tablet (5 mg total) by mouth 2 (two) times daily.   ondansetron 4 MG disintegrating tablet Commonly known as: ZOFRAN-ODT Take 1 tablet (4 mg total) by mouth every 6 (six) hours. What changed:  how much to take when to take this  Discharge Exam: Filed Weights   03/09/22 1031 03/12/22 1300  Weight: 61.8 kg 61.8 kg   General: Somnolent Cardiovascular: Regular rate and rhythm, S1-S2, borderline tachycardia Respiratory: Clear to auscultation bilaterally  Condition at discharge:  Patient is dying and this is likely expected to pass in the next 1 week  The results of significant diagnostics from this hospitalization (including imaging, microbiology, ancillary and laboratory) are listed below for reference.   Imaging Studies: CT ABDOMEN PELVIS WO  CONTRAST  Result Date: 03/09/2022 CLINICAL DATA:  Acute abdominal pain, endometrial cancer EXAM: CT ABDOMEN AND PELVIS WITHOUT CONTRAST TECHNIQUE: Multidetector CT imaging of the abdomen and pelvis was performed following the standard protocol without IV contrast. RADIATION DOSE REDUCTION: This exam was performed according to the departmental dose-optimization program which includes automated exposure control, adjustment of the mA and/or kV according to patient size and/or use of iterative reconstruction technique. COMPARISON:  01/29/2022 FINDINGS: Lower chest: Small bilateral pleural effusions, right greater than left. Associated bilateral lower lobe opacities, likely atelectasis. Hepatobiliary: Unenhanced liver is grossly unremarkable. Gallbladder is unremarkable. No intrahepatic or extrahepatic ductal dilatation. Pancreas: Within normal limits. Spleen: Within normal limits. Adrenals/Urinary Tract: Adrenal glands are within normal limits. Kidneys are within normal limits.  Mild right hydronephrosis. Bladder is underdistended and poorly evaluated. Stomach/Bowel: Stomach is within normal limits. No evidence of bowel obstruction. Normal appendix (series 2/image 51). Scattered mild colonic diverticulosis, without associated inflammatory changes. Vascular/Lymphatic: No evidence of abdominal aortic aneurysm. Atherosclerotic calcifications of the abdominal aorta and branch vessels. No suspicious abdominopelvic lymphadenopathy. Reproductive: Uterus is poorly evaluated in this patient with known endometrial cancer. 5.4 x 4.5 cm left adnexal mass, mildly progressive, raising the possibility of a left adnexal metastasis. Other: Large volume abdominopelvic ascites, malignant, with omental caking in the upper abdomen and a 2.6 cm calcified implant (series 2/image 35). Additional peritoneal implants in the pelvis. These findings are similar to the prior. Musculoskeletal: Degenerative changes of the lower thoracic spine.  Stable sclerosis involving the right inferior pubic ramus (series 2/image 83). IMPRESSION: Uterus is poorly evaluated in this patient with known endometrial cancer. Large volume malignant abdominopelvic ascites with omental caking and peritoneal implants, grossly unchanged. Stable left adnexal mass, suspicious for metastasis. Small bilateral pleural effusions, right greater than left. Associated bilateral lower lobe atelectasis. Electronically Signed   By: Julian Hy M.D.   On: 03/09/2022 00:10   US Paracentesis  Result Date: 03/09/2022 INDICATION: Patient with endometrial cancer and recurrent ascites request received for paracentesis, patient currently undergoing treatment for an active urinary tract infection and will follow-up as an outpatient for tunneled peritoneal PleurX catheter placement after completing antibiotic therapy. EXAM: ULTRASOUND GUIDED  PARACENTESIS MEDICATIONS: Local 1% lidocaine only. COMPLICATIONS: None immediate. PROCEDURE: Informed written consent was obtained from the patient after a discussion of the risks, benefits and alternatives to treatment. A timeout was performed prior to the initiation of the procedure. Initial ultrasound scanning demonstrates a large amount of ascites within the right lower abdominal quadrant. The right lower abdomen was prepped and draped in the usual sterile fashion. 1% lidocaine was used for local anesthesia. Following this, a 19 gauge, 7-cm, Yueh catheter was introduced. An ultrasound image was saved for documentation purposes. The paracentesis was performed. The catheter was removed and a dressing was applied. The patient tolerated the procedure well without immediate post procedural complication. FINDINGS: A total of approximately 5.4 L of amber colored fluid was removed. IMPRESSION: Successful ultrasound-guided paracentesis yielding 5.4 liters of peritoneal fluid. This exam  was performed by Tsosie Billing PA-C, and was supervised and interpreted  by Dr. Denna Haggard. Electronically Signed   By: Albin Felling M.D.   On: 03/09/2022 13:03   DG Chest Port 1 View  Result Date: 03/09/2022 CLINICAL DATA:  Vomiting, possible sepsis EXAM: PORTABLE CHEST 1 VIEW COMPARISON:  Chest radiograph dated 01/29/2022. CT chest dated 01/05/2022 FINDINGS: Layering moderate right pleural effusion. Small left pleural effusion. These are both unchanged from prior CT. No frank interstitial edema.  No pneumothorax. The heart is normal in size.  Thoracic aortic atherosclerosis. IMPRESSION: Moderate right and small left pleural effusions, chronic. Electronically Signed   By: Julian Hy M.D.   On: 03/09/2022 00:59   DG Abd Portable 1V  Result Date: 03/11/2022 CLINICAL DATA:  Abdominal pain and vomiting. EXAM: PORTABLE ABDOMEN - 1 VIEW COMPARISON:  CT abdomen pelvis dated Mar 08, 2022. FINDINGS: The bowel gas pattern is normal. Unchanged 2.7 cm calcified peritoneal implant in the upper abdomen. Unchanged small bilateral pleural effusions and bibasilar atelectasis. IMPRESSION: 1. No acute findings. Electronically Signed   By: Titus Dubin M.D.   On: 03/11/2022 10:10    Microbiology: Results for orders placed or performed during the hospital encounter of 03/09/22  Urine Culture     Status: Abnormal   Collection Time: 03/08/22 11:19 PM   Specimen: Urine, Clean Catch  Result Value Ref Range Status   Specimen Description   Final    URINE, CLEAN CATCH Performed at [facility], [address] [place], [place] [address]    Special Requests   Final    NONE Performed at [facility], [address] [place], [place] [address]    Culture MULTIPLE SPECIES PRESENT, SUGGEST RECOLLECTION (A)  Final   Report Status 03/10/2022 FINAL  Final    Labs: CBC: Recent Labs  Lab 03/08/22 2210 03/10/22 0433 03/11/22 0359 03/13/22 0409 03/14/22 0429  WBC 6.9 6.5 5.9 9.9 11.3*  HGB 10.6* 8.9* 9.0* 10.0* 9.8*  HCT 35.2* 28.9* 29.9* 33.6* 33.3*  MCV  91.9 89.5 90.6 92.8 93.3  PLT 658* 515* 489* 508* 458*   Basic Metabolic Panel: Recent Labs  Lab 03/09/22 0026 03/09/22 0547 03/09/22 1507 03/10/22 0433 03/11/22 0359 03/13/22 0409 03/14/22 0429  NA 142   < > 143 143 144 144 149*  K 6.1*   < > 5.1 4.2 3.9 3.7 4.0  CL 109   < > 105 109 111 112* 117*  CO2 14*   < > 20* 25 24 17* 16*  GLUCOSE 152*   < > 116* 98 87 109* 112*  BUN 67*   < > 56* 51* 39* 30* 31*  CREATININE 2.64*   < > 1.95* 1.59* 1.56* 1.46* 1.70*  CALCIUM 9.9   < > 8.7* 8.1* 8.0* 8.1* 8.4*  MG 2.8*  --   --   --   --   --   --    < > = values in this interval not displayed.   Liver Function Tests: Recent Labs  Lab 03/08/22 2210 03/09/22 0026  AST 21 16  ALT 8 7  ALKPHOS 57 63  BILITOT 1.1 0.9  PROT 7.0 7.3  ALBUMIN 2.9* 3.1*   CBG: Recent Labs  Lab 03/13/22 1221 03/13/22 1821 03/13/22 2017 03/14/22 0739 03/14/22 1621  GLUCAP 89 104* 94 110* 111*    Discharge time spent: less than 30 minutes.  Signed: Annita Brod, MD [facility] 03/15/2022

## 2022-03-15 NOTE — Progress Notes (Signed)
Report called to Su Grand at [facility].  Era Bumpers, RN

## 2022-03-15 NOTE — TOC Transition Note (Signed)
Transition of Care [facility]) - CM/SW Discharge Note   Patient Details  Name: Sabrina [name] MRN: [id number] Date of Birth: [birth year]  [facility] CM/SW Contact:  Harriet Masson, RN Phone Number:[phone] 03/15/2022, 11:52 AM   Clinical Narrative:    Pt offered a bed at  [facility] facility via [facility]. TOC RN spoke with daughter at bedside on the arrangements. Currently awaiting signature via acceptance of the bed and daughter receptive to [facility] for transport services to the facility chosen. Medical necessity letter printed to floor for EMS with no additional request or needs at this time.    Final next level of care: [place] Barriers to Discharge: Barriers Resolved   Patient Goals and CMS Choice Patient states their goals for this hospitalization and ongoing recovery are:: daughter wants the patient to make her decision about being DNR and hospice CMS Medicare.gov Compare Post Acute Care list provided to:: Patient Represenative (must comment) Choice offered to / list presented to : Adult Children  Discharge Placement              Patient chooses bed at:  (Daughter [name])) Patient to be transferred to facility by: [place] Name of family member notified: [name] Patient and family notified of of transfer: 03/15/22  Discharge Plan and Services In-house Referral: Hospice / Palliative Care Discharge Planning Services: CM Consult Post Acute Care Choice:  (TBD)                               Social Determinants of Health ([place]) Interventions     Readmission Risk Interventions    03/10/2022    9:23 AM  Readmission Risk Prevention Plan  Transportation Screening Complete  Medication Review ([place]) Complete  PCP or Specialist appointment within 3-5 days of discharge Complete  SW Recovery [place] Not Applicable

## 2022-03-26 DEATH — deceased

## 2022-11-12 IMAGING — MG MM DIGITAL SCREENING BILAT W/ TOMO AND CAD
6 of 12 series · 6 of 36 positions shown · non-contrast
Comparison: Previous exam(s).

CLINICAL DATA: Screening.

EXAM:
DIGITAL SCREENING BILATERAL MAMMOGRAM WITH TOMOSYNTHESIS AND CAD
TECHNIQUE: Bilateral screening digital craniocaudal and mediolateral oblique
mammograms were obtained. Bilateral screening digital breast
tomosynthesis was performed. The images were evaluated with
computer-aided detection.

[R MLO synth-2D (1 of 2)]
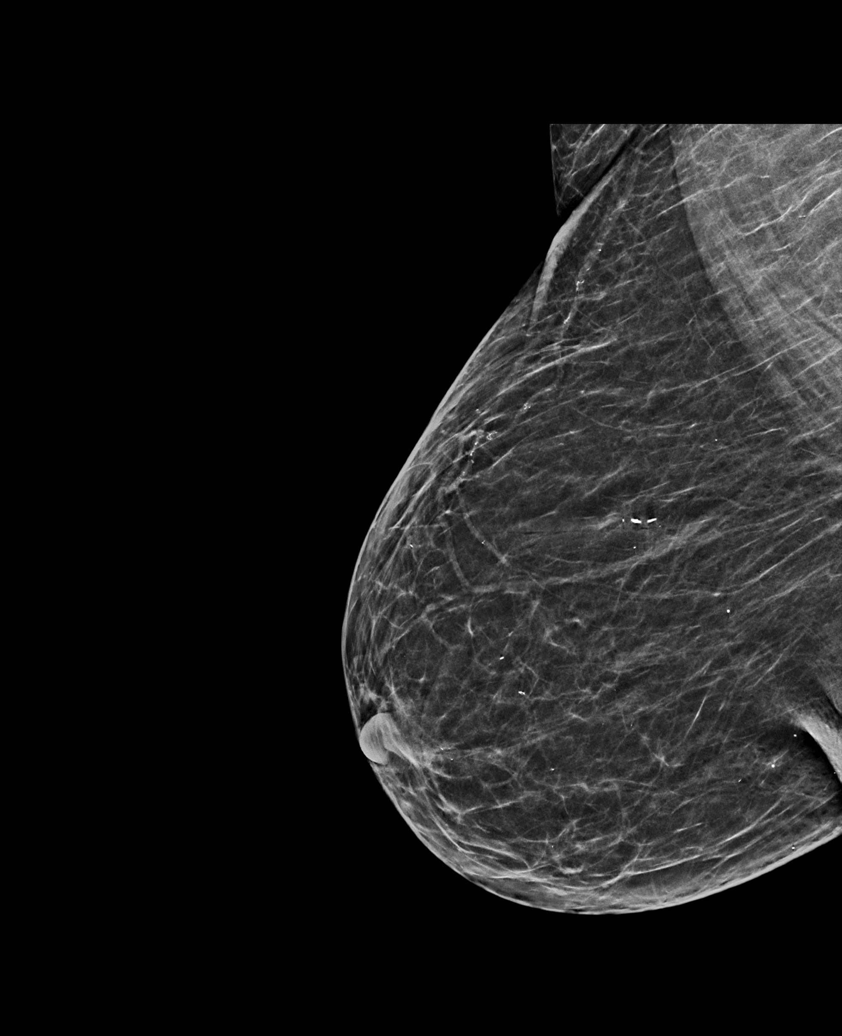

[L MLO synth-2D (1 of 2)]
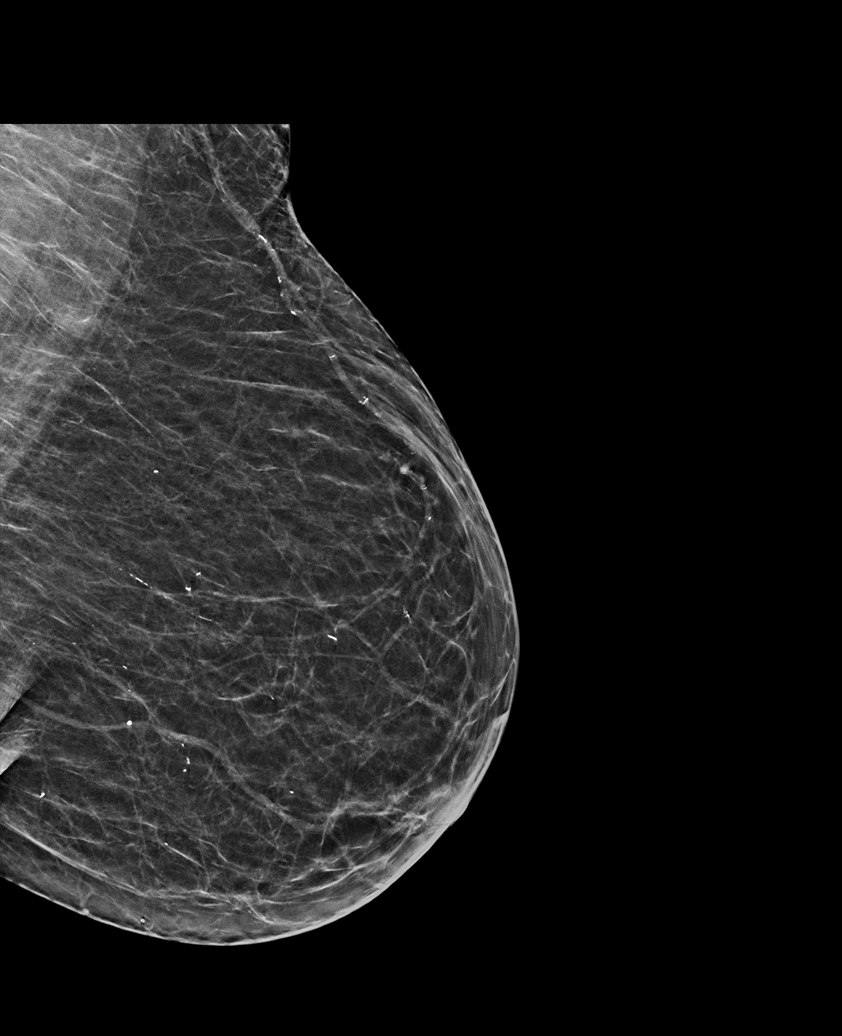

[L CC synth-2D]
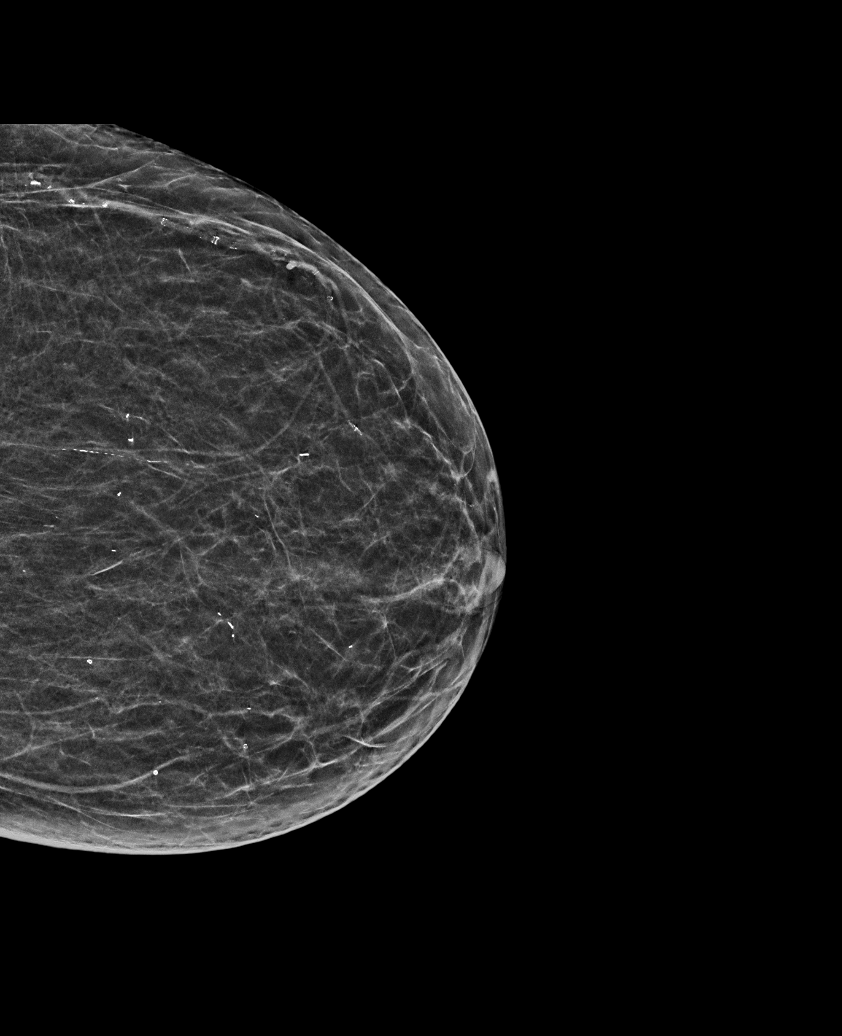

[R MLO synth-2D (2 of 2)]
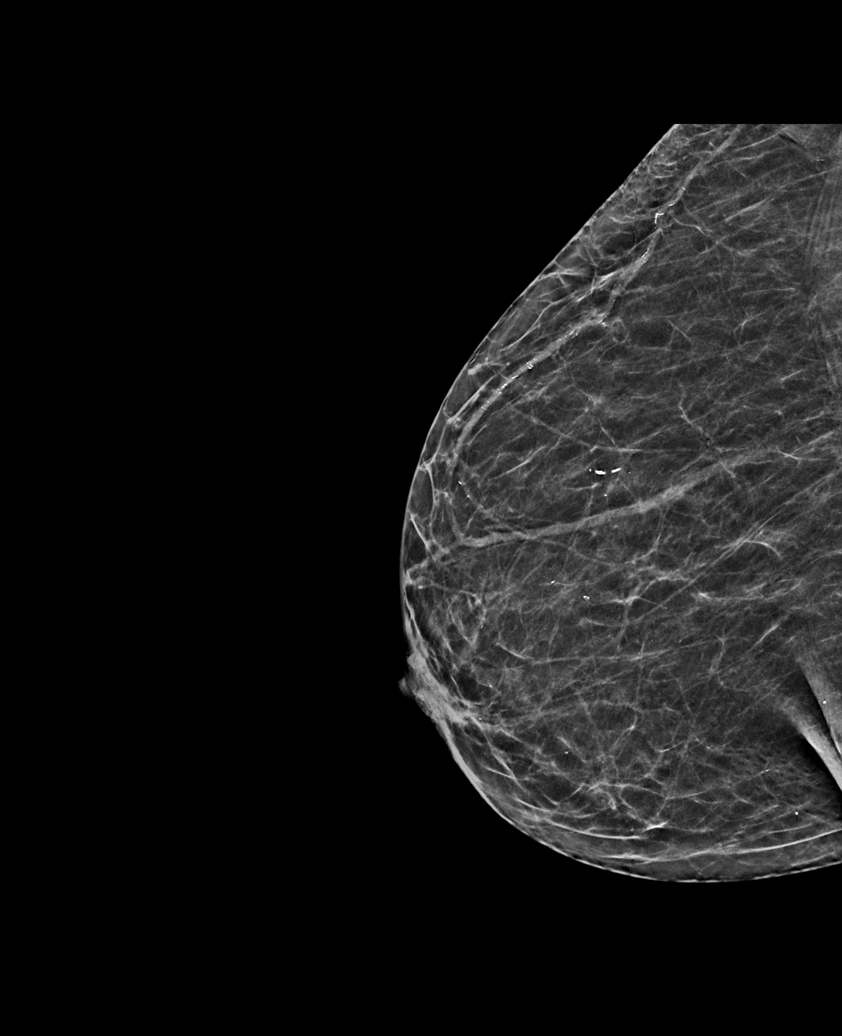

[L MLO synth-2D (2 of 2)]
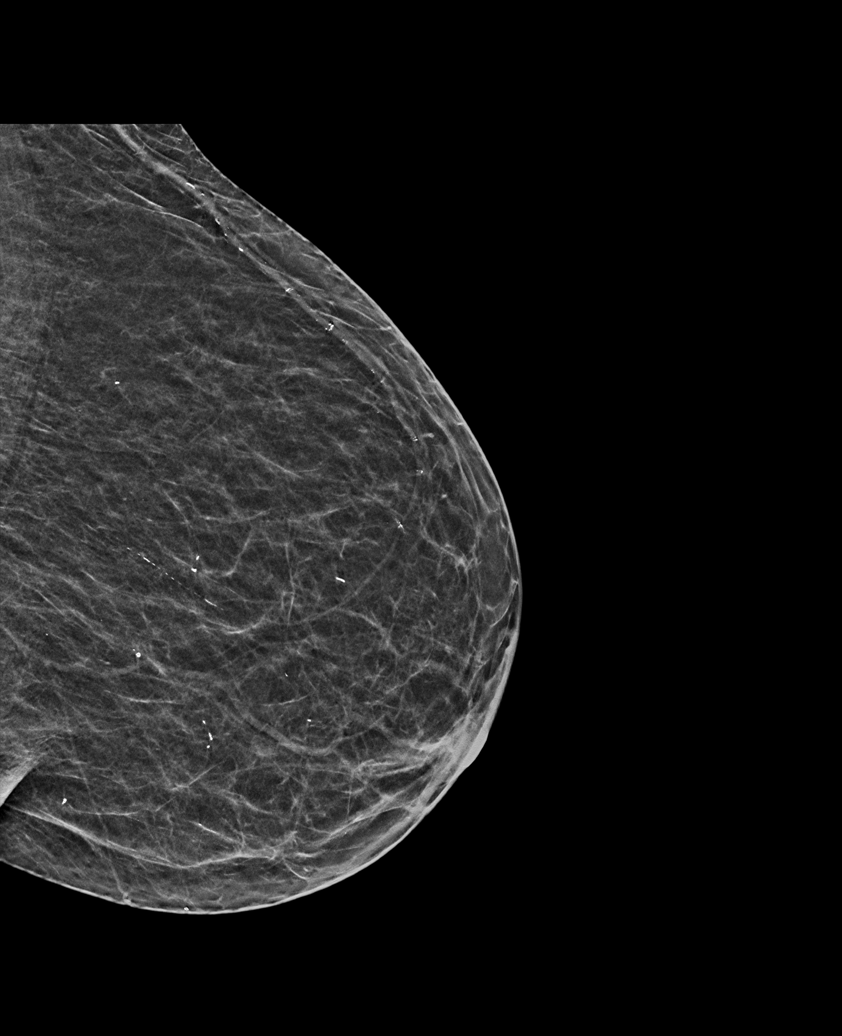

[R CC synth-2D]
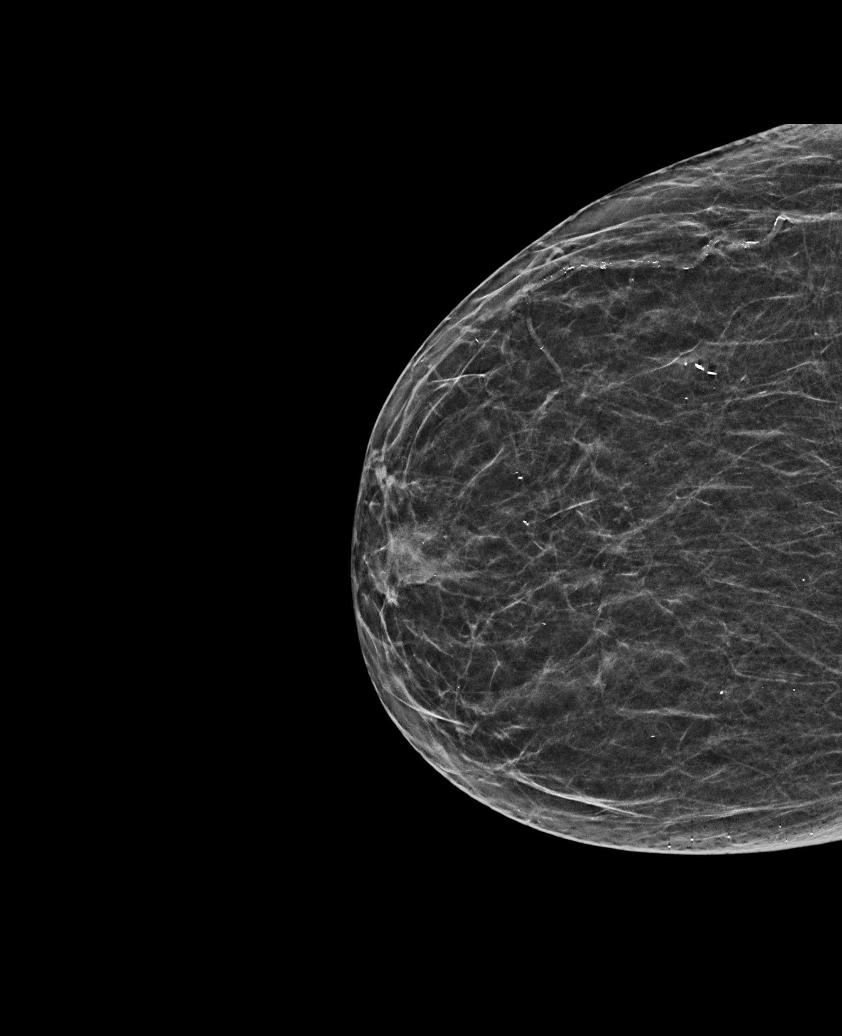

[6 of 36 positions shown; findings below may reference images not displayed]

ACR Breast Density Category b: There are scattered areas of
fibroglandular density.
FINDINGS: There are no findings suspicious for malignancy.
IMPRESSION: No mammographic evidence of malignancy. A result letter of this
screening mammogram will be mailed directly to the patient.

RECOMMENDATION:
Screening mammogram in one year. (Code:51-O-LD2)

BI-RADS CATEGORY  1: Negative.
